# Patient Record
Sex: Female | Born: 1950 | Race: White | Hispanic: No | Marital: Single | Smoking: Current some day smoker
Health system: Southern US, Community
[De-identification: ages and names within clinical notes are randomized; demographics above are authoritative.]

## PROBLEM LIST (undated history)

## (undated) DIAGNOSIS — F32A Depression, unspecified: Secondary | ICD-10-CM

## (undated) DIAGNOSIS — M4800 Spinal stenosis, site unspecified: Secondary | ICD-10-CM

## (undated) DIAGNOSIS — K219 Gastro-esophageal reflux disease without esophagitis: Secondary | ICD-10-CM

## (undated) DIAGNOSIS — K429 Umbilical hernia without obstruction or gangrene: Secondary | ICD-10-CM

## (undated) DIAGNOSIS — M549 Dorsalgia, unspecified: Secondary | ICD-10-CM

## (undated) DIAGNOSIS — F419 Anxiety disorder, unspecified: Secondary | ICD-10-CM

## (undated) DIAGNOSIS — M1990 Unspecified osteoarthritis, unspecified site: Secondary | ICD-10-CM

## (undated) DIAGNOSIS — N950 Postmenopausal bleeding: Secondary | ICD-10-CM

## (undated) DIAGNOSIS — C55 Malignant neoplasm of uterus, part unspecified: Secondary | ICD-10-CM

## (undated) DIAGNOSIS — I10 Essential (primary) hypertension: Secondary | ICD-10-CM

## (undated) DIAGNOSIS — K439 Ventral hernia without obstruction or gangrene: Secondary | ICD-10-CM

## (undated) DIAGNOSIS — G8929 Other chronic pain: Secondary | ICD-10-CM

## (undated) HISTORY — DX: Postmenopausal bleeding: N95.0

## (undated) HISTORY — DX: Other chronic pain: G89.29

## (undated) HISTORY — DX: Ventral hernia without obstruction or gangrene: K43.9

## (undated) HISTORY — DX: Dorsalgia, unspecified: M54.9

## (undated) HISTORY — DX: Essential (primary) hypertension: I10

## (undated) HISTORY — PX: BREAST BIOPSY: SHX20

## (undated) HISTORY — PX: FOOT SURGERY: SHX648

## (undated) HISTORY — PX: CARPAL TUNNEL RELEASE: SHX101

## (undated) HISTORY — DX: Unspecified osteoarthritis, unspecified site: M19.90

## (undated) HISTORY — DX: Spinal stenosis, site unspecified: M48.00

## (undated) HISTORY — PX: CHOLECYSTECTOMY: SHX55

## (undated) HISTORY — DX: Umbilical hernia without obstruction or gangrene: K42.9

## (undated) HISTORY — PX: UMBILICAL HERNIA REPAIR: SHX196

## (undated) HISTORY — DX: Malignant neoplasm of uterus, part unspecified: C55

---

## 2004-07-07 ENCOUNTER — Ambulatory Visit: Payer: Self-pay

## 2004-07-18 ENCOUNTER — Ambulatory Visit: Payer: Self-pay

## 2005-01-18 ENCOUNTER — Ambulatory Visit: Payer: Self-pay

## 2005-07-19 ENCOUNTER — Ambulatory Visit: Payer: Self-pay

## 2005-08-21 ENCOUNTER — Other Ambulatory Visit: Payer: Self-pay

## 2005-08-24 ENCOUNTER — Ambulatory Visit: Payer: Self-pay | Admitting: Surgery

## 2006-02-05 ENCOUNTER — Ambulatory Visit: Payer: Self-pay | Admitting: Unknown Physician Specialty

## 2006-02-20 ENCOUNTER — Ambulatory Visit: Payer: Self-pay | Admitting: Unknown Physician Specialty

## 2006-08-01 ENCOUNTER — Ambulatory Visit: Payer: Self-pay

## 2006-10-23 ENCOUNTER — Ambulatory Visit: Payer: Self-pay | Admitting: Physician Assistant

## 2006-10-25 ENCOUNTER — Ambulatory Visit: Payer: Self-pay | Admitting: Surgery

## 2006-10-25 ENCOUNTER — Other Ambulatory Visit: Payer: Self-pay

## 2007-08-06 ENCOUNTER — Ambulatory Visit: Payer: Self-pay

## 2008-09-01 ENCOUNTER — Ambulatory Visit: Payer: Self-pay

## 2013-05-23 DIAGNOSIS — E7800 Pure hypercholesterolemia, unspecified: Secondary | ICD-10-CM | POA: Insufficient documentation

## 2013-05-23 DIAGNOSIS — Q669 Congenital deformity of feet, unspecified, unspecified foot: Secondary | ICD-10-CM | POA: Insufficient documentation

## 2013-05-23 DIAGNOSIS — Q6689 Other  specified congenital deformities of feet: Secondary | ICD-10-CM | POA: Insufficient documentation

## 2013-12-25 ENCOUNTER — Inpatient Hospital Stay: Payer: Self-pay | Admitting: Surgery

## 2013-12-25 LAB — COMPREHENSIVE METABOLIC PANEL
Albumin: 3.4 g/dL (ref 3.4–5.0)
Alkaline Phosphatase: 73 U/L
Anion Gap: 12 (ref 7–16)
BUN: 31 mg/dL — ABNORMAL HIGH (ref 7–18)
Bilirubin,Total: 0.8 mg/dL (ref 0.2–1.0)
CALCIUM: 8.8 mg/dL (ref 8.5–10.1)
CREATININE: 0.98 mg/dL (ref 0.60–1.30)
Chloride: 86 mmol/L — ABNORMAL LOW (ref 98–107)
Co2: 25 mmol/L (ref 21–32)
EGFR (Non-African Amer.): >60
GLUCOSE: 142 mg/dL — AB (ref 65–99)
OSMOLALITY: 257 (ref 275–301)
Potassium: 3.3 mmol/L — ABNORMAL LOW (ref 3.5–5.1)
SGOT(AST): 27 U/L (ref 15–37)
SGPT (ALT): 22 U/L
Sodium: 123 mmol/L — ABNORMAL LOW (ref 136–145)
TOTAL PROTEIN: 7.4 g/dL (ref 6.4–8.2)

## 2013-12-25 LAB — CBC WITH DIFFERENTIAL/PLATELET
BASOS ABS: 0.1 10*3/uL (ref 0.0–0.1)
BASOS PCT: 0.4 %
Eosinophil #: 0.1 10*3/uL (ref 0.0–0.7)
Eosinophil %: 0.6 %
HCT: 41.1 % (ref 35.0–47.0)
HGB: 14.2 g/dL (ref 12.0–16.0)
Lymphocyte #: 1.7 10*3/uL (ref 1.0–3.6)
Lymphocyte %: 10.8 %
MCH: 30.2 pg (ref 26.0–34.0)
MCHC: 34.6 g/dL (ref 32.0–36.0)
MCV: 88 fL (ref 80–100)
Monocyte #: 1.5 x10 3/mm — ABNORMAL HIGH (ref 0.2–0.9)
Monocyte %: 9.3 %
NEUTROS ABS: 12.5 10*3/uL — AB (ref 1.4–6.5)
Neutrophil %: 78.9 %
Platelet: 309 10*3/uL (ref 150–440)
RBC: 4.69 10*6/uL (ref 3.80–5.20)
RDW: 13.4 % (ref 11.5–14.5)
WBC: 15.9 10*3/uL — AB (ref 3.6–11.0)

## 2013-12-25 LAB — INFLUENZA A,B,H1N1 - PCR (ARMC)
H1N1 flu by pcr: NOT DETECTED
INFLAPCR: NEGATIVE
Influenza B By PCR: NEGATIVE

## 2013-12-25 LAB — TROPONIN I: Troponin-I: <0.02 ng/mL

## 2013-12-25 LAB — LIPASE, BLOOD: Lipase: 137 U/L (ref 73–393)

## 2013-12-26 HISTORY — PX: VENTRAL HERNIA REPAIR: SHX424

## 2013-12-26 LAB — URINALYSIS, COMPLETE
Bilirubin,UR: NEGATIVE
Blood: NEGATIVE
GLUCOSE, UR: NEGATIVE mg/dL (ref 0–75)
LEUKOCYTE ESTERASE: NEGATIVE
NITRITE: NEGATIVE
PROTEIN: NEGATIVE
Ph: 5 (ref 4.5–8.0)
RBC,UR: 16 /HPF (ref 0–5)
SPECIFIC GRAVITY: 1.01 (ref 1.003–1.030)
Squamous Epithelial: 23
WBC UR: NONE SEEN /HPF (ref 0–5)

## 2013-12-26 LAB — CBC WITH DIFFERENTIAL/PLATELET
Basophil #: 0.1 10*3/uL (ref 0.0–0.1)
Basophil %: 0.6 %
EOS PCT: 0.6 %
Eosinophil #: 0.1 10*3/uL (ref 0.0–0.7)
HCT: 36.5 % (ref 35.0–47.0)
HGB: 12.5 g/dL (ref 12.0–16.0)
Lymphocyte #: 1.2 10*3/uL (ref 1.0–3.6)
Lymphocyte %: 8.8 %
MCH: 30.1 pg (ref 26.0–34.0)
MCHC: 34.2 g/dL (ref 32.0–36.0)
MCV: 88 fL (ref 80–100)
MONO ABS: 1.6 x10 3/mm — AB (ref 0.2–0.9)
MONOS PCT: 12.3 %
Neutrophil #: 10.1 10*3/uL — ABNORMAL HIGH (ref 1.4–6.5)
Neutrophil %: 77.7 %
PLATELETS: 277 10*3/uL (ref 150–440)
RBC: 4.14 10*6/uL (ref 3.80–5.20)
RDW: 13.6 % (ref 11.5–14.5)
WBC: 13.1 10*3/uL — AB (ref 3.6–11.0)

## 2013-12-27 LAB — CBC WITH DIFFERENTIAL/PLATELET
Basophil #: 0 10*3/uL (ref 0.0–0.1)
Basophil %: 0.1 %
Eosinophil #: 0 10*3/uL (ref 0.0–0.7)
Eosinophil %: 0.1 %
HCT: 36.8 % (ref 35.0–47.0)
HGB: 12.8 g/dL (ref 12.0–16.0)
LYMPHS ABS: 0.9 10*3/uL — AB (ref 1.0–3.6)
Lymphocyte %: 7.1 %
MCH: 30.7 pg (ref 26.0–34.0)
MCHC: 34.7 g/dL (ref 32.0–36.0)
MCV: 89 fL (ref 80–100)
MONOS PCT: 9.4 %
Monocyte #: 1.2 x10 3/mm — ABNORMAL HIGH (ref 0.2–0.9)
NEUTROS ABS: 10.6 10*3/uL — AB (ref 1.4–6.5)
NEUTROS PCT: 83.3 %
Platelet: 260 10*3/uL (ref 150–440)
RBC: 4.16 10*6/uL (ref 3.80–5.20)
RDW: 13.7 % (ref 11.5–14.5)
WBC: 12.7 10*3/uL — AB (ref 3.6–11.0)

## 2013-12-27 LAB — BASIC METABOLIC PANEL
Anion Gap: 8 (ref 7–16)
BUN: 19 mg/dL — ABNORMAL HIGH (ref 7–18)
CALCIUM: 7.9 mg/dL — AB (ref 8.5–10.1)
CHLORIDE: 89 mmol/L — AB (ref 98–107)
CREATININE: 0.83 mg/dL (ref 0.60–1.30)
Co2: 32 mmol/L (ref 21–32)
EGFR (Non-African Amer.): >60
Glucose: 159 mg/dL — ABNORMAL HIGH (ref 65–99)
Osmolality: 265 (ref 275–301)
Potassium: 3.5 mmol/L (ref 3.5–5.1)
Sodium: 129 mmol/L — ABNORMAL LOW (ref 136–145)

## 2013-12-27 LAB — URINALYSIS, COMPLETE
Bacteria: NONE SEEN
Bilirubin,UR: NEGATIVE
GLUCOSE, UR: NEGATIVE mg/dL (ref 0–75)
Ketone: NEGATIVE
Leukocyte Esterase: NEGATIVE
NITRITE: NEGATIVE
Ph: 5 (ref 4.5–8.0)
Specific Gravity: 1.026 (ref 1.003–1.030)
Squamous Epithelial: NONE SEEN
WBC UR: 1 /HPF (ref 0–5)

## 2013-12-28 LAB — COMPREHENSIVE METABOLIC PANEL
ALT: 16 U/L
Albumin: 2.4 g/dL — ABNORMAL LOW (ref 3.4–5.0)
Alkaline Phosphatase: 65 U/L
Anion Gap: 5 — ABNORMAL LOW (ref 7–16)
BUN: 12 mg/dL (ref 7–18)
Bilirubin,Total: 0.4 mg/dL (ref 0.2–1.0)
CO2: 33 mmol/L — AB (ref 21–32)
Calcium, Total: 7.7 mg/dL — ABNORMAL LOW (ref 8.5–10.1)
Chloride: 96 mmol/L — ABNORMAL LOW (ref 98–107)
Creatinine: 0.81 mg/dL (ref 0.60–1.30)
EGFR (African American): >60
Glucose: 135 mg/dL — ABNORMAL HIGH (ref 65–99)
OSMOLALITY: 270 (ref 275–301)
Potassium: 3.2 mmol/L — ABNORMAL LOW (ref 3.5–5.1)
SGOT(AST): 22 U/L (ref 15–37)
SODIUM: 134 mmol/L — AB (ref 136–145)
Total Protein: 5.8 g/dL — ABNORMAL LOW (ref 6.4–8.2)

## 2013-12-28 LAB — CBC WITH DIFFERENTIAL/PLATELET
Basophil #: 0.1 10*3/uL (ref 0.0–0.1)
Basophil %: 0.8 %
EOS PCT: 2.8 %
Eosinophil #: 0.3 10*3/uL (ref 0.0–0.7)
HCT: 35.3 % (ref 35.0–47.0)
HGB: 11.8 g/dL — ABNORMAL LOW (ref 12.0–16.0)
Lymphocyte #: 1.3 10*3/uL (ref 1.0–3.6)
Lymphocyte %: 13.8 %
MCH: 30.2 pg (ref 26.0–34.0)
MCHC: 33.3 g/dL (ref 32.0–36.0)
MCV: 91 fL (ref 80–100)
MONO ABS: 1 x10 3/mm — AB (ref 0.2–0.9)
Monocyte %: 10.7 %
Neutrophil #: 6.9 10*3/uL — ABNORMAL HIGH (ref 1.4–6.5)
Neutrophil %: 71.9 %
PLATELETS: 244 10*3/uL (ref 150–440)
RBC: 3.89 10*6/uL (ref 3.80–5.20)
RDW: 13.3 % (ref 11.5–14.5)
WBC: 9.5 10*3/uL (ref 3.6–11.0)

## 2014-01-10 ENCOUNTER — Inpatient Hospital Stay: Payer: Self-pay | Admitting: Surgery

## 2014-01-10 HISTORY — PX: IRRIGATION AND DEBRIDEMENT ABDOMEN: SHX6600

## 2014-01-10 LAB — CBC WITH DIFFERENTIAL/PLATELET
BASOS ABS: 0.1 10*3/uL (ref 0.0–0.1)
Basophil %: 0.8 %
Eosinophil #: 0.4 10*3/uL (ref 0.0–0.7)
Eosinophil %: 3.1 %
HCT: 30.6 % — ABNORMAL LOW (ref 35.0–47.0)
HGB: 10.2 g/dL — ABNORMAL LOW (ref 12.0–16.0)
LYMPHS ABS: 1.2 10*3/uL (ref 1.0–3.6)
Lymphocyte %: 10.1 %
MCH: 29.7 pg (ref 26.0–34.0)
MCHC: 33.2 g/dL (ref 32.0–36.0)
MCV: 89 fL (ref 80–100)
MONOS PCT: 7.9 %
Monocyte #: 0.9 x10 3/mm (ref 0.2–0.9)
NEUTROS ABS: 9.2 10*3/uL — AB (ref 1.4–6.5)
Neutrophil %: 78.1 %
Platelet: 240 10*3/uL (ref 150–440)
RBC: 3.43 10*6/uL — ABNORMAL LOW (ref 3.80–5.20)
RDW: 13.6 % (ref 11.5–14.5)
WBC: 11.9 10*3/uL — ABNORMAL HIGH (ref 3.6–11.0)

## 2014-01-10 LAB — BASIC METABOLIC PANEL
Anion Gap: 5 — ABNORMAL LOW (ref 7–16)
BUN: 6 mg/dL — ABNORMAL LOW (ref 7–18)
CALCIUM: 8.3 mg/dL — AB (ref 8.5–10.1)
CHLORIDE: 106 mmol/L (ref 98–107)
CREATININE: 0.8 mg/dL (ref 0.60–1.30)
Co2: 26 mmol/L (ref 21–32)
EGFR (African American): >60
EGFR (Non-African Amer.): >60
Glucose: 131 mg/dL — ABNORMAL HIGH (ref 65–99)
Osmolality: 273 (ref 275–301)
Potassium: 3.5 mmol/L (ref 3.5–5.1)
Sodium: 137 mmol/L (ref 136–145)

## 2014-01-14 LAB — WOUND CULTURE

## 2014-04-29 NOTE — Op Note (Signed)
PATIENT NAME:  Selena Rodgers, Selena Rodgers MR#:  [id number] DATE OF BIRTH:  Jul 06, 1950  DATE OF PROCEDURE:  12/26/2013  PREOPERATIVE DIAGNOSIS: Incarcerated ventral hernia with obstruction.   POSTOPERATIVE DIAGNOSIS: Incarcerated ventral hernia with obstruction.   OPERATION: Ventral hernia repair.  SURGEON: Rodena Goldmann III, MD  ANESTHESIA: General.  OPERATIVE PROCEDURE: With the patient in supine position, after the induction of appropriate general anesthesia, the patient's abdomen was prepped with chloraprep and draped with sterile towels. The hernia was easily palpable below and to the right of the umbilicus. The previous infraumbilical incision was opened and extended to the right side. The hernia sac was encountered easily. It was quite edematous and difficult to dissect free from surrounding tissue. It eventually was opened and resected back to the fascia. The bowel itself appeared moderately dusky. In trying to release the fascia for better exposure, the bowel fell back into the abdomen. I was unable to retrieve that particular piece of bowel again, as she had multiple abdominal adhesions. In clearing the fascia, it was noted that there were several more abdominal defects under the umbilicus and slightly superior with almost a Swiss cheese look in the anterior abdominal wall. There was evidence from the previous repair with Prolene suture, but the AlloDerm mesh was no longer visible. The fascia was cleaned in a circumferential fashion. The superior-most defect was closed internally with figure-of-eight sutures of 0-Prolene. Because of the bowel incarceration and strangulation, I was concerned about the possibility of contamination. So we elected to perform a primary repair with a running suture of #1 Prolene interlocking sutures tying under the umbilicus and then putting an overlay patch of Vicryl mesh to help support the closure. The Vicryl mesh was sutured in with 0 Vicryl. A drain was placed through a  separate stab wound using a 7 mm flat Jackson-Pratt drain secured with 3-0 nylon. The subcutaneous space was obliterated with 3-0 Vicryl and the skin was clipped. A compressive dressing was applied as was an abdominal binder. I am concerned about the repair in the situation, but in view of the potential for contamination, I was uncomfortable putting a permanent mesh in place. She may need formal repair in the future when she is not obstructed. The patient awakened and returned to the recovery room in satisfactory condition.    ____________________________ Micheline Maze, MD rle:sw D: 12/26/2013 02:47:57 ET T: 12/26/2013 07:36:16 ET JOB#: [id number]  cc: Micheline Maze, MD, <Dictator> John B. Sarina Ser, MD Rodena Goldmann MD ELECTRONICALLY SIGNED 01/02/2014 23:06

## 2014-05-03 NOTE — Discharge Summary (Signed)
PATIENT NAME:  [name], [name] MR#:  [id number] DATE OF BIRTH:  10/15/1950  DATE OF ADMISSION:  12/25/2013 DATE OF DISCHARGE:  12/30/2013  BRIEF HISTORY: Selena Rodgers is a 64 year old woman admitted on 12/25/2013 with incarcerated ventral hernia. She had a long-standing ventral hernia but suffered an episode of incarceration on the day prior to admission. She presented to the Emergency Room with obvious tenderness. CT scan revealed edematous, compromised bowel in the hernia sac. She was taken urgently to surgery early that morning where she underwent hernia repair. In manipulating the hernia defect the bowel, which appeared quite dusky, fell back into the abdomen and could not be retrieved. I then elected to perform a primary repair and reinforced it with Vicryl mesh because of concern about the possibility of infection. She did well with slow return of bowel function. She was discharged home on the 29th and will be followed in the office in 7 to 10 days' time. Bathing, activity, and driving instructions were given to the patient.   DISCHARGE MEDICATIONS: She is to resume her home medications including citalopram 20 mg p.o. once a day, hydrochlorothiazide 25 mg p.o. once a day, benazepril 20 mg once a day, Naproxen 500 mg twice a day and hydrocodone/acetaminophen 5/325 every 6 hours p.r.n.   FINAL DISCHARGE DIAGNOSIS: Incarcerated ventral hernia.   SURGERY: Ventral hernia repair.   ____________________________ Micheline Maze, MD rle:AT D: 01/13/2014 18:40:00 ET T: 01/14/2014 03:27:49 ET JOB#: [id number]  cc: Hewitt Blade. Sarina Ser, MD Rodena Goldmann III, MD, <Dictator>

## 2014-05-03 NOTE — H&P (Signed)
   Subjective/Chief Complaint Postop abdominal wall cellulitis, postop abscess on CT   History of Present Illness 64 yo F with history of recent repair of incarcerated VH, primary with vicryl mesh overlay, who was discharged in satisfactory condition and later returned to office visit with erythema around incision and cloudy fluid from drain.  Was began on augmentin without improvement.  CT shows abdominal wall abscess superficial to repair.  WBC 13.   Otherwise doing well.  No fevers/chills, tolerating diet.  Min pain.   Past History hernia repair, primary with vicryl overlay 12/25 H/o hernia repair with alloderm mesh H/o cholecystectomy H/o spinal stenosis obesity chronic back pain H/o carpel tunnel release H/o foot surgery   Past Medical Health Hypertension   Code Status Full Code   Past Med/Surgical Hx:  Spinal Stenosis:   Arthritis:   Hypertension:   Hernia:   Chronic Back Pain:   Breast Biopsy:   Hernia Repair:   Foot Surgery - Right:   Carpal Tunnel Release:   ALLERGIES:  Chlor-Trimeton: Anaphylaxis  Tylenol: Rash  ASA: Unknown  Latex: Unknown  Tape: Unknown  Other -Explain in Comment Field: Unknown  Flax Seed Oil: Swelling   Medications Augmentin   Family and Social History:  Family History Non-Contributory   Social History negative tobacco, negative ETOH   Place of Living Home   Review of Systems:  Subjective/Chief Complaint abdominal wall cellulitis, erythema   Fever/Chills No   Cough No   Sputum No   Abdominal Pain No   Diarrhea No   Constipation No   Nausea/Vomiting No   SOB/DOE No   Chest Pain No   Dysuria No   Physical Exam:  GEN well developed, well nourished, no acute distress, obese   HEENT pale conjunctivae, PERRL, hearing intact to voice, good dentition   NECK No masses   RESP normal resp effort  clear BS   CARD regular rate  no murmur  no thrills   ABD denies tenderness  soft  normal BS  + cellulitis right incision    EXTR negative cyanosis/clubbing, negative edema   SKIN normal to palpation, No rashes, No ulcers   NEURO cranial nerves intact, negative rigidity, negative tremor, follows commands   PSYCH A+O to time, place, person, good insight    Assessment/Admission Diagnosis 64 yo obese female with postop abscess s/p repair of incarcerated recurrent VH.   Plan Admit, IV abx, IVF.  Plan for I and D with Dr. Marina Gravel, have discussed with him and he agrees with this plan.   Electronic Signatures: Floyde Parkins (MD)  (Signed 08-Jan-16 14:41)  Authored: CHIEF COMPLAINT and HISTORY, PAST MEDICAL/SURGIAL HISTORY, ALLERGIES, OTHER MEDICATIONS, FAMILY AND SOCIAL HISTORY, REVIEW OF SYSTEMS, PHYSICAL EXAM, ASSESSMENT AND PLAN   Last Updated: 08-Jan-16 14:41 by Floyde Parkins (MD)

## 2014-05-03 NOTE — Discharge Summary (Signed)
PATIENT NAME:  Selena Rodgers, Selena Rodgers MR#:  [id number] DATE OF BIRTH:  1950-08-09  DATE OF ADMISSION:  12/25/2013 DATE OF DISCHARGE:  12/30/2013  BRIEF HISTORY: Selena Rodgers is a 64 year old woman admitted on 12/25/2013 with incarcerated ventral hernia. She had a long-standing ventral hernia but suffered an episode of incarceration on the day prior to admission. She presented to the Emergency Room with obvious tenderness. CT scan revealed edematous, compromised bowel in the hernia sac. She was taken urgently to surgery early that morning where she underwent hernia repair. In manipulating the hernia defect the bowel, which appeared quite dusky, fell back into the abdomen and could not be retrieved. I then elected to perform a primary repair and reinforced it with Vicryl mesh because of concern about the possibility of infection. She did well with slow return of bowel function. She was discharged home on the 29th and will be followed in the office in 7 to 10 days' time. Bathing, activity, and driving instructions were given to the patient.   DISCHARGE MEDICATIONS: She is to resume her home medications including citalopram 20 mg p.o. once a day, hydrochlorothiazide 25 mg p.o. once a day, benazepril 20 mg once a day, Naproxen 500 mg twice a day and hydrocodone/acetaminophen 5/325 every 6 hours p.r.n.   FINAL DISCHARGE DIAGNOSIS: Incarcerated ventral hernia.   SURGERY: Ventral hernia repair.   ____________________________ Micheline Maze, MD rle:AT D: 01/13/2014 18:40:00 ET T: 01/14/2014 03:27:49 ET JOB#: [id number]  cc: Hewitt Blade. Sarina Ser, MD Rodena Goldmann III, MD, <Dictator>    Rodena Goldmann MD ELECTRONICALLY SIGNED 01/17/2014 18:39

## 2014-05-03 NOTE — Op Note (Signed)
PATIENT NAME:  Selena Rodgers, Selena Rodgers MR#:  [id number] DATE OF BIRTH:  [birth year]  DATE OF PROCEDURE:  01/10/2014  PREOPERATIVE DIAGNOSIS: Abdominal wall infection, status post herniorrhaphy with mesh.   POSTOPERATIVE DIAGNOSIS: Abdominal wall infection, status post herniorrhaphy with mesh.   PROCEDURE PERFORMED: Incision and drainage of abdominal wall and placement of Penrose drain.   SURGEON: Eureka Valdes A. Marina Gravel, MD.   ASSISTANT: Scrub tech.  TYPE OF ANESTHESIA: General with LMA.  FINDINGS: Pus.   SPECIMENS: Pus.   DRAINS: A 1 inch Penrose drain.   DESCRIPTION OF PROCEDURE: With informed consent, supine position, general anesthesia with LMA, the patient's abdominal wall dressing was removed. The abdominal wall was sterilely prepped and draped with Betadine solution. Timeout was observed. The incision had been closed with staples and on the right lateral aspect 3 to 4 staples were removed. Initially the subcutaneous tissues appeared to be intact. However, deep to this I was able to probe into a cavity which was above the fascia and above the mesh, demonstrating a large amount of thick, white, creamy pus. A sample was sent for micro bacteriological analysis.   The wound was then irrigated with saline, aspirated dry. All loculations appeared to be disrupted with finger fracture technique. A 1 inch Penrose drain was directed into the cavity exiting the wound. The skin edges were thus reapproximated over the drain utilizing interrupted simple and vertical mattress 3-0 nylon sutures. A bulky occlusive dressing was placed and the patient was subsequently extubated and taken to the recovery room in stable and satisfactory condition by anesthesia services.    ____________________________ Jeannette How Marina Gravel, MD mab:at D: 01/10/2014 08:49:00 ET T: 01/10/2014 09:17:23 ET JOB#: [id number]  cc: Elta Guadeloupe A. Marina Gravel, MD, <Dictator> Hortencia Conradi MD ELECTRONICALLY SIGNED 01/14/2014 10:25

## 2014-06-17 ENCOUNTER — Ambulatory Visit: Payer: Self-pay | Admitting: Surgery

## 2014-06-23 ENCOUNTER — Ambulatory Visit (INDEPENDENT_AMBULATORY_CARE_PROVIDER_SITE_OTHER): Payer: 59 | Admitting: Surgery

## 2014-06-23 ENCOUNTER — Encounter: Payer: Self-pay | Admitting: Surgery

## 2014-06-23 VITALS — BP 119/71 | HR 67 | Temp 98.7°F | Ht 67.0 in | Wt 273.0 lb

## 2014-06-23 DIAGNOSIS — K432 Incisional hernia without obstruction or gangrene: Secondary | ICD-10-CM

## 2014-06-23 DIAGNOSIS — I10 Essential (primary) hypertension: Secondary | ICD-10-CM | POA: Diagnosis not present

## 2014-06-23 DIAGNOSIS — R1900 Intra-abdominal and pelvic swelling, mass and lump, unspecified site: Secondary | ICD-10-CM | POA: Diagnosis not present

## 2014-06-23 NOTE — Patient Instructions (Signed)
You will need a CT of Abdomen and Pelvis. Central Scheduling should call within 3 days. If you do not hear from them, please call our office.  CT results will depend on when Follow-up appt is set-up.

## 2014-06-26 ENCOUNTER — Ambulatory Visit
Admission: RE | Admit: 2014-06-26 | Discharge: 2014-06-26 | Disposition: A | Discharge status: Home / Self Care | Payer: 59 | Source: Ambulatory Visit | Attending: Surgery | Admitting: Surgery

## 2014-06-26 DIAGNOSIS — M47896 Other spondylosis, lumbar region: Secondary | ICD-10-CM | POA: Insufficient documentation

## 2014-06-26 DIAGNOSIS — R1900 Intra-abdominal and pelvic swelling, mass and lump, unspecified site: Secondary | ICD-10-CM

## 2014-06-26 DIAGNOSIS — K439 Ventral hernia without obstruction or gangrene: Secondary | ICD-10-CM | POA: Diagnosis not present

## 2014-06-26 MED ORDER — IOHEXOL 300 MG/ML  SOLN
100.0000 mL | Freq: Once | INTRAMUSCULAR | Status: AC | PRN
  Administered 2014-06-26: 100 mL via INTRAVENOUS

## 2014-06-29 ENCOUNTER — Telehealth: Payer: Self-pay | Admitting: Surgery

## 2014-06-29 NOTE — Telephone Encounter (Signed)
Patient, Selena Rodgers called and would like the nurse to call her sister, Selena Rodgers with the results her CT from last week. Thanks

## 2014-06-29 NOTE — Telephone Encounter (Signed)
Called patient's sister to review CT scan. Explained results and that patient would need to f/u in office with Dr. Marina Gravel to discuss next step. She only wants to see Dr. Pat Patrick and was placed on the schedule on 07/28/14 in [place].  Verbalized understanding of everything explained during phone call and read back appointment information.

## 2014-06-30 DIAGNOSIS — R1900 Intra-abdominal and pelvic swelling, mass and lump, unspecified site: Secondary | ICD-10-CM | POA: Insufficient documentation

## 2014-06-30 DIAGNOSIS — K432 Incisional hernia without obstruction or gangrene: Secondary | ICD-10-CM | POA: Insufficient documentation

## 2014-06-30 NOTE — Progress Notes (Signed)
Patient ID: Selena Rodgers, female   DOB: [birth year], 64 y.o.   MRN: [id number]  Chief Complaint  Patient presents with  . Follow-up    Intra-abdominal Abscess  . Post-op Problem    Ventral Hernia Repair    HPI  Selena Rodgers is a 64 y.o. Female with a history of hypertension and morbid obesity as well as prior ventral hernia repair with Vicryl mesh due to incarcerated bowel several months ago which was complicated by a postoperative wound infection which was drained. Since that time the patient is had persistent swelling around her umbilicus. She has had intermittent drainage from a punctum of the skin from the prior incision and drainage site. There has been no fevers. No jaundice no nausea no vomiting no diarrhea. Further imaging has been performed. The patient refers herself back to the office.  Past Medical History  Diagnosis Date  . Spinal stenosis   . Chronic back pain   . Arthritis   . Hypertension     Past Surgical History  Procedure Laterality Date  . Cholecystectomy    . Breast biopsy    . Foot surgery Right   . Carpal tunnel release    . Umbilical hernia repair    . Ventral hernia repair  12/26/2013    Dr. Pat Patrick  . Irrigation and debridement abdomen  01/10/2014    Dr. Marina Gravel    Family History  Problem Relation Age of Onset  . Diabetes Maternal Grandmother   . Diabetes Maternal Grandfather   . Diabetes Paternal Grandmother   . Diabetes Paternal Grandfather   . Hypertension Mother     Social History History  Substance Use Topics  . Smoking status: Former Smoker    Quit date: 01/02/2014  . Smokeless tobacco: Never Used  . Alcohol Use: No    Allergies  Allergen Reactions  . Other Anaphylaxis    Chlor-Trimeton  . Flax Seed Oil [Bio-Flax] Swelling  . Latex Hives    Blisters  . Tape     Blisters  . Acetaminophen Rash  . Aspirin Rash    Current Outpatient Prescriptions  Medication Sig Dispense Refill  . benazepril (LOTENSIN) 20 MG tablet Take 1  tablet by mouth daily.    . citalopram (CELEXA) 20 MG tablet Take 1 tablet by mouth daily.    Marland Kitchen esomeprazole (NEXIUM) 20 MG capsule Take 1 capsule by mouth daily.    . hydrochlorothiazide (HYDRODIURIL) 25 MG tablet Take 1 tablet by mouth daily as needed.    . naproxen (EC NAPROSYN) 500 MG EC tablet Take 1 tablet by mouth 2 (two) times daily.     No current facility-administered medications for this visit.      Review of Systems A 10 point review of systems was asked and was negative except for the following positive findings described in history of present illness above.  Blood pressure 119/71, pulse 67, temperature 98.7 F (37.1 C), temperature source Oral, height 5\' 7"  (1.702 m), weight 123.832 kg (273 lb).  Physical Exam CONSTITUTIONAL:  Pleasant, well-developed, well-nourished, and in no acute distress. Morbidly obese white female in no distress.  EYES: Pupils equal and reactive to light, Sclera non-icteric EARS, NOSE, MOUTH AND THROAT:  The oropharynx was clear.  Dentition is good repair.  Oral mucosa pink and moist. LYMPH NODES:  Lymph nodes in the neck and axillae were normal RESPIRATORY:  Lungs were clear.  Normal respiratory effort without pathologic use of accessory muscles of respiration CARDIOVASCULAR: Heart was regular  without murmurs.  There were no carotid bruits. GI: The abdomen was soft, nontender, and nondistended. There is a large reducible ventral midline periumbilical hernia. There is a 3 mm area of granuloma on the scan without punctate drainage. It is at the inferior aspect of the prior incisional ventral hernia repair scar. There is no cellulitis. Skin appears normal in this area except for the granuloma. GU:  Rectal deferred.   MUSCULOSKELETAL:  Normal muscle strength and tone.  No clubbing or cyanosis.   SKIN:  There were no pathologic skin lesions.  There were no nodules on palpation. NEUROLOGIC:  Sensation is normal.  Cranial nerves are grossly intact. PSYCH:   Oriented to person, place and time.  Mood and affect are normal.  Data Reviewed Prior hospitalization records and operative notes were reviewed personally in the office today.  I have personally reviewed the patient's imaging, laboratory findings and medical records.    Assessment    This is a 64 year old morbidly obese white female with a recurrent incisional ventral hernia likely secondary to the fact that she had a wound infection and Vicodin mesh was placed. At this point she is a very high risk patient for recurrent herniation despite any type of repair.    Plan    I will obtain a CT scan of the patient be seen back in the office following this for consideration of further operative versus nonoperative interventions. She may benefit from referral to a tertiary care hernia center.         Sherri Rad 06/30/2014, 9:05 AM

## 2014-07-28 ENCOUNTER — Encounter: Payer: Self-pay | Admitting: Surgery

## 2014-07-28 ENCOUNTER — Ambulatory Visit (INDEPENDENT_AMBULATORY_CARE_PROVIDER_SITE_OTHER): Payer: 59 | Admitting: Surgery

## 2014-07-28 VITALS — BP 138/65 | HR 79 | Temp 97.8°F | Ht 67.0 in | Wt 273.0 lb

## 2014-07-28 DIAGNOSIS — K432 Incisional hernia without obstruction or gangrene: Secondary | ICD-10-CM

## 2014-07-28 NOTE — Patient Instructions (Signed)
Follow-up in 2-3 weeks with Dr. Pat Patrick.

## 2014-07-28 NOTE — Progress Notes (Signed)
Outpatient Surgical Follow Up  07/28/2014  Selena Rodgers is an 64 y.o. female. With a recurrent umbilical/ventral hernia.  Chief Complaint  Patient presents with  . Follow-up    Abdominal Wound  . Umbilical Hernia    HPI: She underwent repair of incarcerated umbilical/ventral hernia at the end of last year. There was a large amount of free fluid and clear-cut bowel ischemia at the time of repair. I placed a piece of Vicryl mesh to bridge the defect because of the risk of infection. Subsequently several weeks later she did develop a big abdominal wall infection which required subsequent drainage. She's done well in the interim but her hernia has recurred as might be expected. She denies any GI or GU symptoms at the present time. She has been gaining some weight.  Past Medical History  Diagnosis Date  . Spinal stenosis   . Chronic back pain   . Arthritis   . Hypertension     Past Surgical History  Procedure Laterality Date  . Cholecystectomy    . Breast biopsy    . Foot surgery Right   . Carpal tunnel release    . Umbilical hernia repair    . Ventral hernia repair  12/26/2013    Dr. Pat Patrick  . Irrigation and debridement abdomen  01/10/2014    Dr. Marina Gravel    Family History  Problem Relation Age of Onset  . Diabetes Maternal Grandmother   . Diabetes Maternal Grandfather   . Diabetes Paternal Grandmother   . Diabetes Paternal Grandfather   . Hypertension Mother     Social History:  reports that she quit smoking about 6 months ago. She has never used smokeless tobacco. She reports that she does not drink alcohol or use illicit drugs.  Allergies:  Allergies  Allergen Reactions  . Other Anaphylaxis    Chlor-Trimeton  . Flax Seed Oil [Bio-Flax] Swelling  . Latex Hives    Blisters  . Tape     Blisters  . 2,4-D Dimethylamine (Amisol) Rash    Tdap  . Acetaminophen Rash  . Aspirin Rash    Medications reviewed.    ROS there are no change in her review of systems. She  has no new pulmonary cardiac or GI symptoms.    BP 138/65 mmHg  Pulse 79  Temp(Src) 97.8 F (36.6 C) (Oral)  Ht 5\' 7"  (1.702 m)  Wt 273 lb (123.832 kg)  BMI 42.75 kg/m2  Physical Exam  Constitutional: She is oriented to person, place, and time.  Markedly overweight  HENT:  Head: Normocephalic and atraumatic.  Eyes: Conjunctivae are normal. Pupils are equal, round, and reactive to light.  Neck: Normal range of motion. Neck supple.  Cardiovascular: Regular rhythm and normal heart sounds.   Pulmonary/Chest: She has no wheezes. She has no rales.  Abdominal: She exhibits distension and mass. There is tenderness.  She has a large recurrent hernia with an open draining sinus tract from just around her umbilicus. The fluid appears to be serosanguineous.  Musculoskeletal: Normal range of motion. She exhibits edema.  Neurological: She is alert and oriented to person, place, and time.  Skin: Skin is warm and dry.  Psychiatric: Mood and judgment normal.       No results found for this or any previous visit (from the past 48 hour(s)). No results found.  Assessment/Plan:  1. Incisional hernia, without obstruction or gangrene She continues to have a draining sinus tract from the site of her previous repair. This does  not appear to be infected the present time but has not completely cleared. I suspect that the opening is the site of her previous drain placement. I would not recommend any attempted surgical repair until the drainage has stopped completely. She's also very poor surgical candidate with her morbid obesity as long as she does not develop another obstruction I would like to avoid surgery if at all possible. She is in agreement. We'll see her back in 1 month's time.     [name]  07/28/2014,negative

## 2014-08-21 ENCOUNTER — Ambulatory Visit (INDEPENDENT_AMBULATORY_CARE_PROVIDER_SITE_OTHER): Payer: 59 | Admitting: Surgery

## 2014-08-21 ENCOUNTER — Encounter: Payer: Self-pay | Admitting: Surgery

## 2014-08-21 VITALS — BP 130/77 | HR 70 | Temp 98.2°F | Ht 67.0 in | Wt 274.6 lb

## 2014-08-21 DIAGNOSIS — K436 Other and unspecified ventral hernia with obstruction, without gangrene: Secondary | ICD-10-CM

## 2014-08-21 NOTE — Patient Instructions (Addendum)
Follow-up in 1 month with Dr. Pat Patrick. Call with any questions or concerns prior to your appointment.

## 2014-08-21 NOTE — Progress Notes (Signed)
Outpatient Surgical Follow Up  08/21/2014  Selena Rodgers is an 64 y.o. female.   Chief Complaint  Patient presents with  . Follow-up    Drainage from Incisional Hernia Site    HPI: She continues to improve. She does not have any particular complaints currently other than the enlarging recurrent hernia. She had an incarcerated segment of bowel repaired with Vicryl mesh because of the contamination. She did develop a postoperative infection and now with the Vicryl resort being she is developed a recurrent hernia. She had a persistent drainage site and we were concerned about possible fistula but that is improving.  Past Medical History  Diagnosis Date  . Spinal stenosis   . Chronic back pain   . Arthritis   . Hypertension     Past Surgical History  Procedure Laterality Date  . Cholecystectomy    . Breast biopsy    . Foot surgery Right   . Carpal tunnel release    . Umbilical hernia repair    . Ventral hernia repair  12/26/2013    Dr. Pat Patrick  . Irrigation and debridement abdomen  01/10/2014    Dr. Marina Gravel    Family History  Problem Relation Age of Onset  . Diabetes Maternal Grandmother   . Diabetes Maternal Grandfather   . Diabetes Paternal Grandmother   . Diabetes Paternal Grandfather   . Hypertension Mother     Social History:  reports that she quit smoking about 7 months ago. She has never used smokeless tobacco. She reports that she does not drink alcohol or use illicit drugs.  Allergies:  Allergies  Allergen Reactions  . Other Anaphylaxis    Chlor-Trimeton  . Flax Seed Oil [Bio-Flax] Swelling  . Latex Hives    Blisters  . Tape     Blisters  . 2,4-D Dimethylamine (Amisol) Rash    Tdap  . Acetaminophen Rash  . Aspirin Rash    Medications reviewed.    ROS    BP 130/77 mmHg  Pulse 70  Temp(Src) 98.2 F (36.8 C) (Oral)  Ht 5\' 7"  (1.702 m)  Wt 274 lb 9.6 oz (124.558 kg)  BMI 43.00 kg/m2  Physical Exam her abdominal wall looks good. The hernia is  slowly increasing in size. It looks as though the draining sinus finally scabbed over. Her abdomen is otherwise benign with minimal tenderness and good bowel sounds and no rebound or guarding.     No results found for this or any previous visit (from the past 48 hour(s)). No results found.  Assessment/Plan:  1. Ventral hernia with obstruction and without gangrene She continues to improve. She would be a very difficult repair. I would not want to put mesh back in this abdominal wall for several months following this most recent infection. We'll see her back again in a month's time repeat her CT scan atretic an idea of the size of the defect. She is pushing for repair but I would be very reluctant to attempt another repair with her medical problems her size and her recent infection.     Selena Rodgers  08/21/2014,negative

## 2014-09-17 DIAGNOSIS — M1990 Unspecified osteoarthritis, unspecified site: Secondary | ICD-10-CM | POA: Insufficient documentation

## 2014-09-17 DIAGNOSIS — F32A Depression, unspecified: Secondary | ICD-10-CM | POA: Insufficient documentation

## 2014-09-17 DIAGNOSIS — G8929 Other chronic pain: Secondary | ICD-10-CM | POA: Insufficient documentation

## 2014-09-17 DIAGNOSIS — F419 Anxiety disorder, unspecified: Secondary | ICD-10-CM | POA: Insufficient documentation

## 2014-09-17 DIAGNOSIS — M4800 Spinal stenosis, site unspecified: Secondary | ICD-10-CM | POA: Insufficient documentation

## 2014-09-17 DIAGNOSIS — R7303 Prediabetes: Secondary | ICD-10-CM | POA: Insufficient documentation

## 2014-09-17 DIAGNOSIS — I10 Essential (primary) hypertension: Secondary | ICD-10-CM | POA: Insufficient documentation

## 2014-09-17 DIAGNOSIS — F329 Major depressive disorder, single episode, unspecified: Secondary | ICD-10-CM | POA: Insufficient documentation

## 2014-09-17 DIAGNOSIS — M549 Dorsalgia, unspecified: Secondary | ICD-10-CM | POA: Insufficient documentation

## 2014-09-17 DIAGNOSIS — F17200 Nicotine dependence, unspecified, uncomplicated: Secondary | ICD-10-CM | POA: Insufficient documentation

## 2014-09-23 ENCOUNTER — Encounter: Payer: Self-pay | Admitting: Surgery

## 2014-09-23 ENCOUNTER — Ambulatory Visit (INDEPENDENT_AMBULATORY_CARE_PROVIDER_SITE_OTHER): Payer: 59 | Admitting: Surgery

## 2014-09-23 VITALS — BP 131/71 | HR 69 | Temp 98.4°F | Ht 67.0 in | Wt 280.0 lb

## 2014-09-23 DIAGNOSIS — K420 Umbilical hernia with obstruction, without gangrene: Secondary | ICD-10-CM

## 2014-09-23 NOTE — Patient Instructions (Signed)
Please call us if you have ant questions and concerns. Central Scheduling will be contacting you with an appointment.

## 2014-09-23 NOTE — Addendum Note (Signed)
Addended by: Wayna Chalet on: 09/23/2014 03:53 PM   Modules accepted: Orders

## 2014-09-23 NOTE — Progress Notes (Signed)
Outpatient Surgical Follow Up  09/23/2014  Selena Rodgers is an 64 y.o. female.   Chief Complaint  Patient presents with  . Follow-up    ventral Hernia    HPI: She returns for follow-up of her incarcerated umbilical hernia complicated by a mesh infection. She had Vicryl mesh placed at that time her original incarceration. She continues to have a small amount of drainage from the abdominal wall. It does look to be purulent although there is no surrounding erythema and no evidence of any ongoing infection. She does not have any current symptoms.  Past Medical History  Diagnosis Date  . Spinal stenosis   . Chronic back pain   . Arthritis   . Hypertension     Past Surgical History  Procedure Laterality Date  . Cholecystectomy    . Breast biopsy    . Foot surgery Right   . Carpal tunnel release    . Umbilical hernia repair    . Ventral hernia repair  12/26/2013    Dr. Pat Patrick  . Irrigation and debridement abdomen  01/10/2014    Dr. Marina Gravel    Family History  Problem Relation Age of Onset  . Diabetes Maternal Grandmother   . Diabetes Maternal Grandfather   . Diabetes Paternal Grandmother   . Diabetes Paternal Grandfather   . Hypertension Mother     Social History:  reports that she quit smoking about 8 months ago. She has never used smokeless tobacco. She reports that she does not drink alcohol or use illicit drugs.  Allergies:  Allergies  Allergen Reactions  . Other Anaphylaxis    Chlor-Trimeton  . Flax Seed Oil [Bio-Flax] Swelling  . Latex Hives    Blisters  . Tape     Blisters  . 2,4-D Dimethylamine (Amisol) Rash    Tdap  . Acetaminophen Rash  . Aspirin Rash    Medications reviewed.    ROS no change    BP 131/71 mmHg  Pulse 69  Temp(Src) 98.4 F (36.9 C) (Oral)  Ht 5\' 7"  (1.702 m)  Wt 127.007 kg (280 lb)  BMI 43.84 kg/m2  Physical Exam her wound looks good and is completely healed with exception of one small spot in the center of the previous  incision. There is a small amount of expressible pus. She does not have any other abdominal findings. I do believe hernias larger than it was in our previous evaluation.     No results found for this or any previous visit (from the past 48 hour(s)). No results found.  Assessment/Plan:  1. Umbilical hernia, incarcerated This woman demonstrates a very complicated problem. With her previous infection we are reluctant to place permanent mesh. She's also significantly overweight which would compromise any recovery. We will repeat her CT scan to be sure there is no evidence of any infection or reason for her to have a persistent draining sinus tract. Most of the mesh should be resolved or removed at this point. We will arrange for her to see one of my partners following her CT scan. She is in agreement.     [name]  09/23/2014,negative

## 2014-09-30 ENCOUNTER — Ambulatory Visit
Admission: RE | Admit: 2014-09-30 | Discharge: 2014-09-30 | Disposition: A | Discharge status: Home / Self Care | Payer: 59 | Source: Ambulatory Visit | Attending: Surgery | Admitting: Surgery

## 2014-09-30 DIAGNOSIS — N200 Calculus of kidney: Secondary | ICD-10-CM | POA: Diagnosis not present

## 2014-09-30 DIAGNOSIS — K420 Umbilical hernia with obstruction, without gangrene: Secondary | ICD-10-CM

## 2014-09-30 DIAGNOSIS — K439 Ventral hernia without obstruction or gangrene: Secondary | ICD-10-CM | POA: Diagnosis not present

## 2014-09-30 DIAGNOSIS — M47896 Other spondylosis, lumbar region: Secondary | ICD-10-CM | POA: Insufficient documentation

## 2014-09-30 DIAGNOSIS — M160 Bilateral primary osteoarthritis of hip: Secondary | ICD-10-CM | POA: Diagnosis not present

## 2014-09-30 MED ORDER — IOHEXOL 350 MG/ML SOLN
100.0000 mL | Freq: Once | INTRAVENOUS | Status: AC | PRN
  Administered 2014-09-30: 100 mL via INTRAVENOUS

## 2014-10-06 ENCOUNTER — Ambulatory Visit: Payer: Self-pay | Admitting: Surgery

## 2014-10-06 ENCOUNTER — Encounter: Payer: Self-pay | Admitting: *Deleted

## 2014-10-06 DIAGNOSIS — Z720 Tobacco use: Secondary | ICD-10-CM | POA: Insufficient documentation

## 2014-10-07 ENCOUNTER — Ambulatory Visit (INDEPENDENT_AMBULATORY_CARE_PROVIDER_SITE_OTHER): Payer: 59 | Admitting: Surgery

## 2014-10-07 ENCOUNTER — Encounter: Payer: Self-pay | Admitting: Surgery

## 2014-10-07 VITALS — BP 138/72 | HR 64 | Temp 98.2°F | Wt 280.0 lb

## 2014-10-07 DIAGNOSIS — K439 Ventral hernia without obstruction or gangrene: Secondary | ICD-10-CM | POA: Insufficient documentation

## 2014-10-07 DIAGNOSIS — K432 Incisional hernia without obstruction or gangrene: Secondary | ICD-10-CM | POA: Diagnosis not present

## 2014-10-07 DIAGNOSIS — R7303 Prediabetes: Secondary | ICD-10-CM | POA: Insufficient documentation

## 2014-10-07 NOTE — Progress Notes (Signed)
Subjective:     Patient ID: Selena Rodgers, female   DOB: [birth year], 64 y.o.   MRN: [id number]  HPI  64 yr old with multiple medical issues including well controlled DM, HTN, obesity, and degenerative arthritis with recurrent ventral hernia.  She had incarceration about a year ago with resection and vicryl mesh placement which subsequently had an infection and required mesh removal.  She has had a chronic draining wound since then which has continued to improve, does not have much drainage but some purulence occasionally.  Otherwise patient is doing well.     Review of Systems  Constitutional: Positive for fatigue. Negative for fever, chills and appetite change.  Gastrointestinal: Positive for abdominal pain. Negative for nausea, vomiting, diarrhea, constipation and blood in stool.  All other systems reviewed and are negative.  Filed Vitals:   10/07/14 1546  BP: 138/72  Pulse: 64  Temp: 98.2 F (36.8 C)       Objective:   Physical Exam  Constitutional: She is oriented to person, place, and time. She appears well-developed and well-nourished. No distress.  HENT:  Head: Normocephalic and atraumatic.  Right Ear: External ear normal.  Left Ear: External ear normal.  Mouth/Throat: Oropharynx is clear and moist. No oropharyngeal exudate.  Eyes: Conjunctivae are normal. Pupils are equal, round, and reactive to light. No scleral icterus.  Neck: Normal range of motion. Neck supple. No tracheal deviation present.  Abdominal: Soft.  Well healed incision in midline but with small chronic wound, minimal drainage of purulent material, some induration inferior about 3cm in size, large ventral hernia which is reducible but immediately returns  Neurological: She is alert and oriented to person, place, and time.  Skin: Skin is warm and dry. No rash noted. No erythema.  Psychiatric: She has a normal mood and affect. Her behavior is normal. Judgment and thought content normal.  Vitals  reviewed.      Assessment:     64 yr old female with large recurrent ventral hernia     Plan:     I discussed extensively her increased risks due to her other comorbidities, mainly her BMI >35.  I discussed that if she could loose about 20% of her body weight she would become a better candidate, although her risk would still be high.  Also discussed that I would not want to do surgery with a draining wound still.  Patient and family in agreement, given the opportunity to ask questions and have them answered. Will have her return in 2 months time, in which she will attempt to loose about 20lbs and will recheck the wound.

## 2014-12-01 ENCOUNTER — Encounter: Payer: Self-pay | Admitting: Surgery

## 2014-12-03 ENCOUNTER — Ambulatory Visit (INDEPENDENT_AMBULATORY_CARE_PROVIDER_SITE_OTHER): Payer: 59 | Admitting: Surgery

## 2014-12-03 ENCOUNTER — Encounter: Payer: Self-pay | Admitting: Surgery

## 2014-12-03 VITALS — BP 131/81 | HR 62 | Temp 99.1°F | Wt 275.0 lb

## 2014-12-03 DIAGNOSIS — K432 Incisional hernia without obstruction or gangrene: Secondary | ICD-10-CM

## 2014-12-03 NOTE — Patient Instructions (Signed)
Follow up with our office in 2 months. You will receive a call from our office with your appointment time and date.

## 2014-12-03 NOTE — Progress Notes (Signed)
Subjective:     Patient ID: Selena Rodgers, female   DOB: 1950/01/12, 64 y.o.   MRN: [id number]  HPI  64 yr old with multiple medical issues including borderline DM, HTN, obesity, and degenerative arthritis with recurrent ventral hernia. She had incarceration about a year ago with resection and vicryl mesh placement which subsequently had an infection and required mesh removal. She has had a chronic draining wound since then which has continued to improve, patient has not noticed drainage and has stopped having to use bandage.  Additionally she has lost about 5 lbs since her last visit two months ago.     Review of Systems  Constitutional: Negative for fever, chills, activity change and appetite change.  HENT: Negative for congestion and sore throat.   Cardiovascular: Negative for chest pain, palpitations and leg swelling.  Gastrointestinal: Negative for nausea, abdominal pain, diarrhea, constipation, abdominal distention and rectal pain.  Genitourinary: Negative for dysuria.  Musculoskeletal: Positive for back pain and joint swelling.  Hematological: Negative for adenopathy. Does not bruise/bleed easily.  Psychiatric/Behavioral: Negative for agitation. The patient is not nervous/anxious.        Filed Vitals:   12/03/14 1647  BP: 131/81  Pulse: 62  Temp: 99.1 F (37.3 C)    Objective:   Physical Exam  Constitutional: She is oriented to person, place, and time. She appears well-developed and well-nourished. No distress.  Cardiovascular: Normal rate, regular rhythm, normal heart sounds and intact distal pulses.   Pulmonary/Chest: Effort normal. No respiratory distress. She has no wheezes.  Abdominal: Soft. Bowel sounds are normal. She exhibits distension. There is no tenderness. There is no rebound.  Large about 10cm ventral hernia defect, reducible but immediately recurs.  Small punctate area of drainage around previous incision site with area of induration much improved only about  1cm in size now, drainage produced with palpation of induration  Neurological: She is alert and oriented to person, place, and time. No cranial nerve deficit.  Skin: Skin is warm and dry. No erythema.  Scaly patchy rash along abdominal wall and legs  Psychiatric: She has a normal mood and affect. Her behavior is normal. Judgment and thought content normal.  Vitals reviewed.      Assessment:     64 yr old female with large recurrent ventral hernia    Plan:     I discussed that draining area improving and good progression with 5lb weight loss over the past 2 months.  I discussed that in order to repair this an abdominal component release would be more appropriate which is a very large operation taking about 6-8 hours to complete.  I explained that this would be a very risky undertaking that would likely put her in the hospital for at least a week and out of work for about 6 weeks.  I would not do it while there is still a draining cavity either.  Patient understands and will be thinking about whether she would like to take on these risks.  Will have her come back to 2 months.

## 2015-02-04 ENCOUNTER — Telehealth: Payer: Self-pay | Admitting: Surgery

## 2015-02-04 NOTE — Telephone Encounter (Signed)
-----   Message from Reece Packer, RN sent at 02/03/2015 11:03 AM EST ----- Please schedule patient to come back in and consult with Dr. Azalee Course about surgery for Large ventral hernia.  Next available appointment is fine, no rush on this

## 2015-02-04 NOTE — Telephone Encounter (Signed)
I attempted to call patient to schedule her appointment with Dr Azalee Course regarding surgery for large ventral hernia. Patient was at work, but I spoke with her sister, Selena Rodgers, who is authorized (per patients chart) to discuss patients personal health information. Joycelyn Schmid told me that at this time Selena has decided to wait to have/schedule surgery for large ventral hernia. I told her that is fine and whenever she may decide to go through with surgery to give [place] a call and we'll get her an appointment scheduled to come in and see Dr Azalee Course. Patients sister acknowledged understanding of this.

## 2015-02-04 NOTE — Telephone Encounter (Signed)
Noted! Thank you

## 2015-03-01 ENCOUNTER — Emergency Department
Admission: EM | Admit: 2015-03-01 | Discharge: 2015-03-01 | Disposition: A | Discharge status: Home / Self Care | Payer: 59 | Attending: Emergency Medicine | Admitting: Emergency Medicine

## 2015-03-01 ENCOUNTER — Emergency Department: Payer: 59

## 2015-03-01 DIAGNOSIS — Z87891 Personal history of nicotine dependence: Secondary | ICD-10-CM | POA: Diagnosis not present

## 2015-03-01 DIAGNOSIS — Z79899 Other long term (current) drug therapy: Secondary | ICD-10-CM | POA: Insufficient documentation

## 2015-03-01 DIAGNOSIS — M25551 Pain in right hip: Secondary | ICD-10-CM | POA: Diagnosis not present

## 2015-03-01 DIAGNOSIS — Z91040 Latex allergy status: Secondary | ICD-10-CM | POA: Insufficient documentation

## 2015-03-01 DIAGNOSIS — T50905A Adverse effect of unspecified drugs, medicaments and biological substances, initial encounter: Secondary | ICD-10-CM | POA: Insufficient documentation

## 2015-03-01 DIAGNOSIS — E099 Drug or chemical induced diabetes mellitus without complications: Secondary | ICD-10-CM | POA: Diagnosis not present

## 2015-03-01 DIAGNOSIS — M1611 Unilateral primary osteoarthritis, right hip: Secondary | ICD-10-CM | POA: Diagnosis not present

## 2015-03-01 DIAGNOSIS — Z791 Long term (current) use of non-steroidal anti-inflammatories (NSAID): Secondary | ICD-10-CM | POA: Insufficient documentation

## 2015-03-01 MED ORDER — CYCLOBENZAPRINE HCL 10 MG PO TABS
5.0000 mg | ORAL_TABLET | Freq: Once | ORAL | Status: AC
  Administered 2015-03-01: 5 mg via ORAL
  Filled 2015-03-01: qty 1

## 2015-03-01 MED ORDER — CYCLOBENZAPRINE HCL 10 MG PO TABS: 10.0000 mg | ORAL_TABLET | Freq: Three times a day (TID) | ORAL | Status: DC | PRN

## 2015-03-01 MED ORDER — OXYCODONE HCL 5 MG PO CAPS: 5.0000 mg | ORAL_CAPSULE | ORAL | Status: DC | PRN

## 2015-03-01 NOTE — ED Provider Notes (Signed)
[facility] Emergency Department Provider Note  ____________________________________________  Time seen: Approximately 8:14 PM  I have reviewed the triage vital signs and the nursing notes.   HISTORY  Chief Complaint Hip Pain    HPI Selena Rodgers is a 65 y.o. female presents with sudden onset of right hip and pelvic pain. Patient states this started today right after work with a radiation and stabbing sensation going down into the  right leg. Denies any direct trauma has a past medical history of chronic back pain spinal stenosis and arthritis. He reports working all day and then this afternoon after work is when the symptoms started coming on. Symptoms are worsened with working   Past Medical History  Diagnosis Date  . Spinal stenosis   . Chronic back pain   . Arthritis   . Umbilical hernia   . Ventral hernia     Patient Active Problem List   Diagnosis Date Noted  . 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 Ventral hernia 10/07/2014  . Current tobacco use 10/06/2014  . Anxiety 09/17/2014  . Back pain, chronic 09/17/2014  . Clinical depression 09/17/2014  . BP (high blood pressure) 09/17/2014  . Borderline diabetes 09/17/2014  . Arthritis, degenerative 09/17/2014  . Spinal stenosis 09/17/2014  . Compulsive tobacco user syndrome 09/17/2014  . Abdominal mass 06/30/2014  . Incisional hernia, without obstruction or gangrene 06/30/2014  . Morbid obesity ([place]) 06/30/2014  . Hypercholesteremia 05/23/2013  . 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 Hypercholesterolemia 05/23/2013    Past Surgical History  Procedure Laterality Date  . Cholecystectomy    . Breast biopsy    . Foot surgery Right   . Carpal tunnel release    . Umbilical hernia repair    . Ventral hernia repair  12/26/2013    Dr. Pat Patrick  . Irrigation and debridement abdomen  01/10/2014    Dr. Marina Gravel    Current Outpatient Rx  Name  Route  Sig  Dispense  Refill  . citalopram  (CELEXA) 20 MG tablet   Oral   Take 1 tablet by mouth daily.         . cyclobenzaprine (FLEXERIL) 10 MG tablet   Oral   Take 1 tablet (10 mg total) by mouth every 8 (eight) hours as needed for muscle spasms.   30 tablet   0   . esomeprazole (NEXIUM) 40 MG capsule   Oral   Take 40 mg by mouth daily at 12 noon.         . naproxen (EC NAPROSYN) 500 MG EC tablet   Oral   Take 1 tablet by mouth 2 (two) times daily.         Marland Kitchen oxycodone (OXY-IR) 5 MG capsule   Oral   Take 1 capsule (5 mg total) by mouth every 4 (four) hours as needed.   8 capsule   0     Allergies Other; Codeine; Flax seed oil; Latex; Tape; 2,4-d dimethylamine (amisol); Acetaminophen; and Aspirin  Family History  Problem Relation Age of Onset  . Diabetes Maternal Grandmother   . Diabetes Maternal Grandfather   . Diabetes Paternal Grandmother   . Diabetes Paternal Grandfather   . Hypertension Mother     Social History Social History  Substance Use Topics  . Smoking status: Former Smoker    Quit date: 01/02/2014  . Smokeless tobacco: Never Used  . Alcohol Use: No    Review of Systems Constitutional: No fever/chills Eyes: No visual changes. ENT:  No sore throat. Cardiovascular: Denies chest pain. Respiratory: Denies shortness of breath. Gastrointestinal: No abdominal pain.  No nausea, no vomiting.  No diarrhea.  No constipation. Genitourinary: Negative for dysuria. Musculoskeletal: Negative for back pain. Skin: Negative for rash. Neurological: Negative for headaches, focal weakness or numbness.  10-point ROS otherwise negative.  ____________________________________________   PHYSICAL EXAM:  VITAL SIGNS: ED Triage Vitals  Enc Vitals Group     BP 03/01/15 1929 151/83 mmHg     Pulse Rate 03/01/15 1929 77     Resp 03/01/15 1929 18     Temp 03/01/15 1929 98.5 F (36.9 C)     Temp Source 03/01/15 1929 Oral     SpO2 03/01/15 1929 98 %     Weight 03/01/15 1929 270 lb (122.471 kg)      Height 03/01/15 1929 5\' 7"  (1.702 m)     Head Cir --      Peak Flow --      Pain Score 03/01/15 1929 5     Pain Loc --      Pain Edu? --      Excl. in [place]? --     Constitutional: Alert and oriented. Well appearing and in no acute distress.   Cardiovascular: Normal rate, regular rhythm. Grossly normal heart sounds.  Good peripheral circulation. Respiratory: Normal respiratory effort.  No retractions. Lungs CTAB. Musculoskeletal: No lower extremity tenderness nor edema.  No joint effusions. Point tenderness noted to the right anterolateral aspect of the hip. Distally neurovascularly intact. Neurologic:  Normal speech and language. No gross focal neurologic deficits are appreciated. No gait instability. Skin:  Skin is warm, dry and intact. No rash noted. Psychiatric: Mood and affect are normal. Speech and behavior are normal.  ____________________________________________   LABS (all labs ordered are listed, but only abnormal results are displayed)  Labs Reviewed - No data to display  RADIOLOGY  IMPRESSION: 1. No fracture or acute finding. 2. Mild hip joint degenerative changes. ____________________________________________   PROCEDURES  Procedure(s) performed: None  Critical Care performed: No  ____________________________________________   INITIAL IMPRESSION / ASSESSMENT AND PLAN / ED COURSE  Pertinent labs & imaging results that were available during my care of the patient were reviewed by me and considered in my medical decision making (see chart for details).  Acute arthritic and degenerative changes of the right hip. Rx given for Flexeril 5 mg 3 times a day and kidney Naprosyn over-the-counter or by prescription from PCP. Return to ER with any worsening symptomology. ____________________________________________   FINAL CLINICAL IMPRESSION(S) / ED DIAGNOSES  Final diagnoses:  Hip pain, acute, right     This chart was dictated using voice recognition  software/Dragon. Despite best efforts to proofread, errors can occur which can change the meaning. Any change was purely unintentional.   Arlyss Repress, PA-C 03/01/15 2109  Earleen Newport, MD 03/01/15 2220

## 2015-03-01 NOTE — ED Notes (Signed)
Pt arrived to ED with c/o right hip pain. Pt reports PMH of arthritis. Pt c/o sharp pain that stabs and radiates into leg with a "heat sensation". Pt denies injury.

## 2015-03-01 NOTE — Discharge Instructions (Signed)
Heat Therapy Heat therapy can help ease sore, stiff, injured, and tight muscles and joints. Heat relaxes your muscles, which may help ease your pain.  RISKS AND COMPLICATIONS If you have any of the following conditions, do not use heat therapy unless your health care provider has approved:  Poor circulation.  Healing wounds or scarred skin in the area being treated.  Diabetes, heart disease, or high blood pressure.  Not being able to feel (numbness) the area being treated.  Unusual swelling of the area being treated.  Active infections.  Blood clots.  Cancer.  Inability to communicate pain. This may include young children and people who have problems with their brain function (dementia).  Pregnancy. Heat therapy should only be used on old, pre-existing, or long-lasting (chronic) injuries. Do not use heat therapy on new injuries unless directed by your health care provider. HOW TO USE HEAT THERAPY There are several different kinds of heat therapy, including:  Moist heat pack.  Warm water bath.  Hot water bottle.  Electric heating pad.  Heated gel pack.  Heated wrap.  Electric heating pad. Use the heat therapy method suggested by your health care provider. Follow your health care provider's instructions on when and how to use heat therapy. GENERAL HEAT THERAPY RECOMMENDATIONS  Do not sleep while using heat therapy. Only use heat therapy while you are awake.  Your skin may turn pink while using heat therapy. Do not use heat therapy if your skin turns red.  Do not use heat therapy if you have new pain.  High heat or long exposure to heat can cause burns. Be careful when using heat therapy to avoid burning your skin.  Do not use heat therapy on areas of your skin that are already irritated, such as with a rash or sunburn. SEEK MEDICAL CARE IF:  You have blisters, redness, swelling, or numbness.  You have new pain.  Your pain is worse. MAKE SURE  YOU:  Understand these instructions.  Will watch your condition.  Will get help right away if you are not doing well or get worse.   This information is not intended to replace advice given to you by your health care provider. Make sure you discuss any questions you have with your health care provider.   Document Released: 03/13/2011 Document Revised: 01/09/2014 Document Reviewed: 02/11/2013 Elsevier Interactive Patient Education 2016 Elsevier Inc.  Hip Pain Your hip is the joint between your upper legs and your lower pelvis. The bones, cartilage, tendons, and muscles of your hip joint perform a lot of work each day supporting your body weight and allowing you to move around. Hip pain can range from a minor ache to severe pain in one or both of your hips. Pain may be felt on the inside of the hip joint near the groin, or the outside near the buttocks and upper thigh. You may have swelling or stiffness as well.  HOME CARE INSTRUCTIONS   Take medicines only as directed by your health care provider.  Apply ice to the injured area:  Put ice in a plastic bag.  Place a towel between your skin and the bag.  Leave the ice on for 15-20 minutes at a time, 3-4 times a day.  Keep your leg raised (elevated) when possible to lessen swelling.  Avoid activities that cause pain.  Follow specific exercises as directed by your health care provider.  Sleep with a pillow between your legs on your most comfortable side.  Record how often  you have hip pain, the location of the pain, and what it feels like. SEEK MEDICAL CARE IF:   You are unable to put weight on your leg.  Your hip is red or swollen or very tender to touch.  Your pain or swelling continues or worsens after 1 week.  You have increasing difficulty walking.  You have a fever. SEEK IMMEDIATE MEDICAL CARE IF:   You have fallen.  You have a sudden increase in pain and swelling in your hip. MAKE SURE YOU:   Understand these  instructions.  Will watch your condition.  Will get help right away if you are not doing well or get worse.   This information is not intended to replace advice given to you by your health care provider. Make sure you discuss any questions you have with your health care provider.   Document Released: 06/08/2009 Document Revised: 01/09/2014 Document Reviewed: 08/15/2012 Elsevier Interactive Patient Education [employer].

## 2015-03-22 DIAGNOSIS — E538 Deficiency of other specified B group vitamins: Secondary | ICD-10-CM | POA: Diagnosis not present

## 2015-03-22 DIAGNOSIS — M4806 Spinal stenosis, lumbar region: Secondary | ICD-10-CM | POA: Diagnosis not present

## 2015-03-22 DIAGNOSIS — R7302 Impaired glucose tolerance (oral): Secondary | ICD-10-CM | POA: Diagnosis not present

## 2015-03-22 DIAGNOSIS — R202 Paresthesia of skin: Secondary | ICD-10-CM | POA: Diagnosis not present

## 2015-03-22 DIAGNOSIS — I10 Essential (primary) hypertension: Secondary | ICD-10-CM | POA: Diagnosis not present

## 2015-03-24 DIAGNOSIS — M47812 Spondylosis without myelopathy or radiculopathy, cervical region: Secondary | ICD-10-CM | POA: Diagnosis not present

## 2015-03-24 DIAGNOSIS — M47816 Spondylosis without myelopathy or radiculopathy, lumbar region: Secondary | ICD-10-CM | POA: Diagnosis not present

## 2015-03-24 DIAGNOSIS — M4806 Spinal stenosis, lumbar region: Secondary | ICD-10-CM | POA: Diagnosis not present

## 2015-04-07 DIAGNOSIS — M4802 Spinal stenosis, cervical region: Secondary | ICD-10-CM | POA: Diagnosis not present

## 2015-04-07 DIAGNOSIS — M4712 Other spondylosis with myelopathy, cervical region: Secondary | ICD-10-CM | POA: Diagnosis not present

## 2015-04-08 ENCOUNTER — Other Ambulatory Visit: Payer: Self-pay | Admitting: Neurosurgery

## 2015-04-13 ENCOUNTER — Encounter (HOSPITAL_COMMUNITY)
Admission: RE | Admit: 2015-04-13 | Discharge: 2015-04-13 | Disposition: A | Discharge status: Home / Self Care | Payer: 59 | Source: Ambulatory Visit | Attending: Neurosurgery | Admitting: Neurosurgery

## 2015-04-13 ENCOUNTER — Encounter (HOSPITAL_COMMUNITY): Payer: Self-pay

## 2015-04-13 DIAGNOSIS — G959 Disease of spinal cord, unspecified: Secondary | ICD-10-CM | POA: Insufficient documentation

## 2015-04-13 DIAGNOSIS — Z01812 Encounter for preprocedural laboratory examination: Secondary | ICD-10-CM | POA: Diagnosis not present

## 2015-04-13 LAB — CBC
HCT: 39.5 % (ref 36.0–46.0)
Hemoglobin: 13.3 g/dL (ref 12.0–15.0)
MCH: 29.9 pg (ref 26.0–34.0)
MCHC: 33.7 g/dL (ref 30.0–36.0)
MCV: 88.8 fL (ref 78.0–100.0)
PLATELETS: 266 10*3/uL (ref 150–400)
RBC: 4.45 MIL/uL (ref 3.87–5.11)
RDW: 13.5 % (ref 11.5–15.5)
WBC: 8.3 10*3/uL (ref 4.0–10.5)

## 2015-04-13 LAB — BASIC METABOLIC PANEL
ANION GAP: 11 (ref 5–15)
BUN: 12 mg/dL (ref 6–20)
CALCIUM: 9.7 mg/dL (ref 8.9–10.3)
CO2: 23 mmol/L (ref 22–32)
CREATININE: 0.69 mg/dL (ref 0.44–1.00)
Chloride: 105 mmol/L (ref 101–111)
GFR calc Af Amer: >60 mL/min (ref >60)
GFR calc non Af Amer: >60 mL/min (ref >60)
Glucose, Bld: 87 mg/dL (ref 65–99)
Potassium: 4.6 mmol/L (ref 3.5–5.1)
Sodium: 139 mmol/L (ref 135–145)

## 2015-04-13 LAB — SURGICAL PCR SCREEN
MRSA, PCR: NEGATIVE
STAPHYLOCOCCUS AUREUS: POSITIVE — AB

## 2015-04-13 NOTE — Progress Notes (Signed)
Pt denies any cardiac history, cardiologist or heart studies.   PCP Dr Vonzella Nipple at [facility] in [place]. Last seen in December.

## 2015-04-13 NOTE — Progress Notes (Signed)
I called a prescription for Mupirocin ointment to [place], [place], [place]

## 2015-04-13 NOTE — Pre-Procedure Instructions (Signed)
    Selena Rodgers  04/13/2015      [facility] [address] [place])  [address] Phone: [phone] Fax: [phone]    Your procedure is scheduled on Monday April 17th   Report to [facility] Admitting at 8:00am.  Call this number if you have problems the morning of surgery: [phone]  If questions prior to surgery date call [phone] between 8 am and 4 pm   Remember:  Do not eat food or drink liquids after midnight.  Take these medicines the morning of surgery with A SIP OF WATER: celexa (citalopram), flexeril if needed, omeprazole (Prilosec), pain pill if needed  Stop taking Naproxen and vit B-12 along with any other anit-inflammatories such as Aleve, Motrin, Ibuprofen, Advil, and any other vitamin or herbal supplements. Do not take Aspirin, Goodies or BC powder   Do not wear jewelry, make-up or nail polish.  Do not wear lotions, powders, or perfumes.  You may not wear deodorant.  Do not shave 48 hours prior to surgery.    Do not bring valuables to the hospital.  [facility] is not responsible for any belongings or valuables.  Contacts, dentures or bridgework may not be worn into surgery.  Leave your suitcase in the car.  After surgery it may be brought to your room.  For patients admitted to the hospital, discharge time will be determined by your treatment team.  Special instructions:  Shower with CHG as instructed the night before and morning of surgery  Please read over the following fact sheets that you were given. Pain Booklet, Coughing and Deep Breathing, MRSA Information and Surgical Site Infection Prevention

## 2015-04-16 MED ORDER — DEXTROSE 5 % IV SOLN
3.0000 g | INTRAVENOUS | Status: AC
  Administered 2015-04-19: 3 g via INTRAVENOUS
  Filled 2015-04-16: qty 3000

## 2015-04-19 ENCOUNTER — Encounter (HOSPITAL_COMMUNITY)
Admission: RE | Disposition: A | Discharge status: Rehabilitation Facility | Payer: Self-pay | Source: Ambulatory Visit | Attending: Neurosurgery

## 2015-04-19 ENCOUNTER — Encounter (HOSPITAL_COMMUNITY): Payer: Self-pay | Admitting: *Deleted

## 2015-04-19 ENCOUNTER — Ambulatory Visit (HOSPITAL_COMMUNITY): Payer: 59

## 2015-04-19 ENCOUNTER — Observation Stay (HOSPITAL_COMMUNITY)
Admission: RE | Admit: 2015-04-19 | Discharge: 2015-04-21 | Disposition: A | Discharge status: Rehabilitation Facility | Payer: 59 | Source: Ambulatory Visit | Attending: Neurosurgery | Admitting: Neurosurgery

## 2015-04-19 ENCOUNTER — Ambulatory Visit (HOSPITAL_COMMUNITY): Payer: 59 | Admitting: Vascular Surgery

## 2015-04-19 ENCOUNTER — Ambulatory Visit (HOSPITAL_COMMUNITY): Payer: 59 | Admitting: Anesthesiology

## 2015-04-19 DIAGNOSIS — M5031 Other cervical disc degeneration,  high cervical region: Secondary | ICD-10-CM | POA: Diagnosis not present

## 2015-04-19 DIAGNOSIS — Z87891 Personal history of nicotine dependence: Secondary | ICD-10-CM | POA: Insufficient documentation

## 2015-04-19 DIAGNOSIS — K5900 Constipation, unspecified: Secondary | ICD-10-CM | POA: Insufficient documentation

## 2015-04-19 DIAGNOSIS — M5001 Cervical disc disorder with myelopathy,  high cervical region: Secondary | ICD-10-CM | POA: Insufficient documentation

## 2015-04-19 DIAGNOSIS — G8929 Other chronic pain: Secondary | ICD-10-CM | POA: Insufficient documentation

## 2015-04-19 DIAGNOSIS — M4802 Spinal stenosis, cervical region: Secondary | ICD-10-CM | POA: Diagnosis not present

## 2015-04-19 DIAGNOSIS — I158 Other secondary hypertension: Secondary | ICD-10-CM | POA: Insufficient documentation

## 2015-04-19 DIAGNOSIS — M1990 Unspecified osteoarthritis, unspecified site: Secondary | ICD-10-CM | POA: Diagnosis not present

## 2015-04-19 DIAGNOSIS — G8918 Other acute postprocedural pain: Secondary | ICD-10-CM | POA: Insufficient documentation

## 2015-04-19 DIAGNOSIS — Z6841 Body Mass Index (BMI) 40.0 and over, adult: Secondary | ICD-10-CM | POA: Insufficient documentation

## 2015-04-19 DIAGNOSIS — M4712 Other spondylosis with myelopathy, cervical region: Secondary | ICD-10-CM | POA: Diagnosis present

## 2015-04-19 DIAGNOSIS — M50021 Cervical disc disorder at C4-C5 level with myelopathy: Secondary | ICD-10-CM | POA: Diagnosis not present

## 2015-04-19 DIAGNOSIS — Z419 Encounter for procedure for purposes other than remedying health state, unspecified: Secondary | ICD-10-CM

## 2015-04-19 DIAGNOSIS — I10 Essential (primary) hypertension: Secondary | ICD-10-CM | POA: Diagnosis not present

## 2015-04-19 DIAGNOSIS — M4322 Fusion of spine, cervical region: Secondary | ICD-10-CM | POA: Diagnosis not present

## 2015-04-19 DIAGNOSIS — M549 Dorsalgia, unspecified: Secondary | ICD-10-CM | POA: Diagnosis not present

## 2015-04-19 HISTORY — PX: ANTERIOR CERVICAL DECOMP/DISCECTOMY FUSION: SHX1161

## 2015-04-19 SURGERY — ANTERIOR CERVICAL DECOMPRESSION/DISCECTOMY FUSION 2 LEVELS
Anesthesia: General

## 2015-04-19 MED ORDER — MIDAZOLAM HCL 5 MG/5ML IJ SOLN
INTRAMUSCULAR | Status: DC | PRN
  Administered 2015-04-19: 2 mg via INTRAVENOUS

## 2015-04-19 MED ORDER — ONDANSETRON HCL 4 MG/2ML IJ SOLN
INTRAMUSCULAR | Status: AC
  Filled 2015-04-19: qty 2

## 2015-04-19 MED ORDER — MORPHINE SULFATE (PF) 2 MG/ML IV SOLN
1.0000 mg | INTRAVENOUS | Status: DC | PRN
  Administered 2015-04-19 – 2015-04-20 (×3): 2 mg via INTRAVENOUS
  Filled 2015-04-19 (×3): qty 1

## 2015-04-19 MED ORDER — PHENYLEPHRINE HCL 10 MG/ML IJ SOLN
10.0000 mg | INTRAVENOUS | Status: DC | PRN
  Administered 2015-04-19: 20 ug/min via INTRAVENOUS

## 2015-04-19 MED ORDER — LACTATED RINGERS IV SOLN: INTRAVENOUS | Status: DC

## 2015-04-19 MED ORDER — ROCURONIUM BROMIDE 100 MG/10ML IV SOLN
INTRAVENOUS | Status: DC | PRN
  Administered 2015-04-19: 50 mg via INTRAVENOUS

## 2015-04-19 MED ORDER — ARTIFICIAL TEARS OP OINT
TOPICAL_OINTMENT | OPHTHALMIC | Status: DC | PRN
  Administered 2015-04-19: 1 via OPHTHALMIC

## 2015-04-19 MED ORDER — CITALOPRAM HYDROBROMIDE 20 MG PO TABS
20.0000 mg | ORAL_TABLET | Freq: Every day | ORAL | Status: DC
  Administered 2015-04-19 – 2015-04-21 (×3): 20 mg via ORAL
  Filled 2015-04-19 (×3): qty 1

## 2015-04-19 MED ORDER — MIDAZOLAM HCL 2 MG/2ML IJ SOLN: 0.5000 mg | Freq: Once | INTRAMUSCULAR | Status: DC | PRN

## 2015-04-19 MED ORDER — ONDANSETRON HCL 4 MG/2ML IJ SOLN
INTRAMUSCULAR | Status: DC | PRN
  Administered 2015-04-19: 4 mg via INTRAVENOUS

## 2015-04-19 MED ORDER — FENTANYL CITRATE (PF) 250 MCG/5ML IJ SOLN
INTRAMUSCULAR | Status: AC
  Filled 2015-04-19: qty 5

## 2015-04-19 MED ORDER — LIDOCAINE HCL (CARDIAC) 20 MG/ML IV SOLN
INTRAVENOUS | Status: AC
  Filled 2015-04-19: qty 5

## 2015-04-19 MED ORDER — DOCUSATE SODIUM 100 MG PO CAPS
100.0000 mg | ORAL_CAPSULE | Freq: Two times a day (BID) | ORAL | Status: DC
  Administered 2015-04-19 – 2015-04-21 (×4): 100 mg via ORAL
  Filled 2015-04-19 (×4): qty 1

## 2015-04-19 MED ORDER — 0.9 % SODIUM CHLORIDE (POUR BTL) OPTIME
TOPICAL | Status: DC | PRN
  Administered 2015-04-19: 1000 mL

## 2015-04-19 MED ORDER — BACITRACIN 50000 UNITS IM SOLR
INTRAMUSCULAR | Status: DC | PRN
  Administered 2015-04-19: 11:00:00

## 2015-04-19 MED ORDER — BUPIVACAINE-EPINEPHRINE (PF) 0.5% -1:200000 IJ SOLN
INTRAMUSCULAR | Status: DC | PRN
  Administered 2015-04-19: 10 mL via PERINEURAL

## 2015-04-19 MED ORDER — MEPERIDINE HCL 25 MG/ML IJ SOLN: 6.2500 mg | INTRAMUSCULAR | Status: DC | PRN

## 2015-04-19 MED ORDER — OXYCODONE HCL 5 MG PO TABS
5.0000 mg | ORAL_TABLET | ORAL | Status: DC | PRN
  Administered 2015-04-20 – 2015-04-21 (×5): 5 mg via ORAL
  Filled 2015-04-19 (×5): qty 1

## 2015-04-19 MED ORDER — THROMBIN 5000 UNITS EX SOLR
CUTANEOUS | Status: DC | PRN
  Administered 2015-04-19 (×2): 5000 [IU] via TOPICAL

## 2015-04-19 MED ORDER — HEMOSTATIC AGENTS (NO CHARGE) OPTIME
TOPICAL | Status: DC | PRN
  Administered 2015-04-19: 1 via TOPICAL

## 2015-04-19 MED ORDER — SUGAMMADEX SODIUM 200 MG/2ML IV SOLN
INTRAVENOUS | Status: DC | PRN
  Administered 2015-04-19: 200 mg via INTRAVENOUS

## 2015-04-19 MED ORDER — VECURONIUM BROMIDE 10 MG IV SOLR
INTRAVENOUS | Status: DC | PRN
  Administered 2015-04-19 (×2): 2 mg via INTRAVENOUS
  Administered 2015-04-19 (×2): 1 mg via INTRAVENOUS

## 2015-04-19 MED ORDER — CYCLOBENZAPRINE HCL 10 MG PO TABS
10.0000 mg | ORAL_TABLET | Freq: Three times a day (TID) | ORAL | Status: DC | PRN
  Filled 2015-04-19 (×2): qty 1

## 2015-04-19 MED ORDER — ROCURONIUM BROMIDE 50 MG/5ML IV SOLN
INTRAVENOUS | Status: AC
  Filled 2015-04-19: qty 1

## 2015-04-19 MED ORDER — PANTOPRAZOLE SODIUM 40 MG PO TBEC
80.0000 mg | DELAYED_RELEASE_TABLET | Freq: Every day | ORAL | Status: DC
  Administered 2015-04-19 – 2015-04-21 (×3): 80 mg via ORAL
  Filled 2015-04-19 (×3): qty 2

## 2015-04-19 MED ORDER — ALUM & MAG HYDROXIDE-SIMETH 200-200-20 MG/5ML PO SUSP: 30.0000 mL | Freq: Four times a day (QID) | ORAL | Status: DC | PRN

## 2015-04-19 MED ORDER — SUGAMMADEX SODIUM 200 MG/2ML IV SOLN
INTRAVENOUS | Status: AC
  Filled 2015-04-19: qty 2

## 2015-04-19 MED ORDER — MENTHOL 3 MG MT LOZG
1.0000 | LOZENGE | OROMUCOSAL | Status: DC | PRN
  Administered 2015-04-21: 3 mg via ORAL
  Filled 2015-04-19: qty 9

## 2015-04-19 MED ORDER — BISACODYL 10 MG RE SUPP: 10.0000 mg | Freq: Every day | RECTAL | Status: DC | PRN

## 2015-04-19 MED ORDER — LIDOCAINE HCL (CARDIAC) 20 MG/ML IV SOLN
INTRAVENOUS | Status: DC | PRN
  Administered 2015-04-19: 40 mg via INTRAVENOUS

## 2015-04-19 MED ORDER — HYDROMORPHONE HCL 1 MG/ML IJ SOLN
0.2500 mg | INTRAMUSCULAR | Status: DC | PRN
  Administered 2015-04-19 (×2): 0.5 mg via INTRAVENOUS

## 2015-04-19 MED ORDER — LACTATED RINGERS IV SOLN
INTRAVENOUS | Status: DC
  Administered 2015-04-19 (×2): via INTRAVENOUS

## 2015-04-19 MED ORDER — ONDANSETRON HCL 4 MG/2ML IJ SOLN: 4.0000 mg | INTRAMUSCULAR | Status: DC | PRN

## 2015-04-19 MED ORDER — DEXAMETHASONE SODIUM PHOSPHATE 4 MG/ML IJ SOLN
4.0000 mg | Freq: Four times a day (QID) | INTRAMUSCULAR | Status: AC
  Administered 2015-04-20 (×2): 4 mg via INTRAVENOUS
  Filled 2015-04-19 (×2): qty 1

## 2015-04-19 MED ORDER — CEFAZOLIN SODIUM-DEXTROSE 2-4 GM/100ML-% IV SOLN
2.0000 g | Freq: Three times a day (TID) | INTRAVENOUS | Status: AC
  Administered 2015-04-19 – 2015-04-20 (×2): 2 g via INTRAVENOUS
  Filled 2015-04-19 (×2): qty 100

## 2015-04-19 MED ORDER — FENTANYL CITRATE (PF) 100 MCG/2ML IJ SOLN
INTRAMUSCULAR | Status: DC | PRN
  Administered 2015-04-19: 100 ug via INTRAVENOUS
  Administered 2015-04-19: 150 ug via INTRAVENOUS

## 2015-04-19 MED ORDER — MIDAZOLAM HCL 2 MG/2ML IJ SOLN
INTRAMUSCULAR | Status: AC
  Filled 2015-04-19: qty 2

## 2015-04-19 MED ORDER — HYDROMORPHONE HCL 1 MG/ML IJ SOLN
INTRAMUSCULAR | Status: AC
Start: 2015-04-19 — End: 2015-04-20
  Filled 2015-04-19: qty 1

## 2015-04-19 MED ORDER — PROPOFOL 10 MG/ML IV BOLUS
INTRAVENOUS | Status: DC | PRN
  Administered 2015-04-19: 120 mg via INTRAVENOUS

## 2015-04-19 MED ORDER — PHENOL 1.4 % MT LIQD: 1.0000 | OROMUCOSAL | Status: DC | PRN

## 2015-04-19 MED ORDER — DEXAMETHASONE 4 MG PO TABS
4.0000 mg | ORAL_TABLET | Freq: Four times a day (QID) | ORAL | Status: AC
  Administered 2015-04-19: 4 mg via ORAL
  Filled 2015-04-19: qty 1

## 2015-04-19 MED ORDER — DEXAMETHASONE SODIUM PHOSPHATE 10 MG/ML IJ SOLN
INTRAMUSCULAR | Status: DC | PRN
  Administered 2015-04-19: 10 mg via INTRAVENOUS

## 2015-04-19 SURGICAL SUPPLY — 58 items
BAG DECANTER FOR FLEXI CONT (MISCELLANEOUS) ×2 IMPLANT
BENZOIN TINCTURE PRP APPL 2/3 (GAUZE/BANDAGES/DRESSINGS) ×2 IMPLANT
BIT DRILL NEURO 2X3.1 SFT TUCH (MISCELLANEOUS) ×1 IMPLANT
BLADE SURG 15 STRL LF DISP TIS (BLADE) ×1 IMPLANT
BLADE SURG 15 STRL SS (BLADE) ×1
BLADE ULTRA TIP 2M (BLADE) ×2 IMPLANT
BRUSH SCRUB EZ PLAIN DRY (MISCELLANEOUS) ×2 IMPLANT
BUR BARREL STRAIGHT FLUTE 4.0 (BURR) ×2 IMPLANT
BUR MATCHSTICK NEURO 3.0 LAGG (BURR) ×2 IMPLANT
CANISTER SUCT 3000ML PPV (MISCELLANEOUS) ×2 IMPLANT
COVER MAYO STAND STRL (DRAPES) ×2 IMPLANT
DRAPE LAPAROTOMY 100X72 PEDS (DRAPES) ×2 IMPLANT
DRAPE MICROSCOPE LEICA (MISCELLANEOUS) IMPLANT
DRAPE POUCH INSTRU U-SHP 10X18 (DRAPES) ×2 IMPLANT
DRAPE SURG 17X23 STRL (DRAPES) ×4 IMPLANT
DRILL NEURO 2X3.1 SOFT TOUCH (MISCELLANEOUS) ×2
ELECT REM PT RETURN 9FT ADLT (ELECTROSURGICAL) ×2
ELECTRODE REM PT RTRN 9FT ADLT (ELECTROSURGICAL) ×1 IMPLANT
GAUZE SPONGE 4X4 12PLY STRL (GAUZE/BANDAGES/DRESSINGS) ×2 IMPLANT
GAUZE SPONGE 4X4 16PLY XRAY LF (GAUZE/BANDAGES/DRESSINGS) ×2 IMPLANT
GLOVE BIO SURGEON STRL SZ7.5 (GLOVE) ×4 IMPLANT
GLOVE BIO SURGEON STRL SZ8 (GLOVE) IMPLANT
GLOVE BIO SURGEON STRL SZ8.5 (GLOVE) IMPLANT
GLOVE EXAM NITRILE LRG STRL (GLOVE) IMPLANT
GLOVE EXAM NITRILE MD LF STRL (GLOVE) IMPLANT
GLOVE EXAM NITRILE XL STR (GLOVE) IMPLANT
GLOVE EXAM NITRILE XS STR PU (GLOVE) IMPLANT
GLOVE SURG SS PI 8.0 STRL IVOR (GLOVE) ×2 IMPLANT
GLOVE SURG SS PI 8.5 STRL IVOR (GLOVE) ×1
GLOVE SURG SS PI 8.5 STRL STRW (GLOVE) ×1 IMPLANT
GOWN STRL REUS W/ TWL LRG LVL3 (GOWN DISPOSABLE) IMPLANT
GOWN STRL REUS W/ TWL XL LVL3 (GOWN DISPOSABLE) IMPLANT
GOWN STRL REUS W/TWL LRG LVL3 (GOWN DISPOSABLE)
GOWN STRL REUS W/TWL XL LVL3 (GOWN DISPOSABLE)
KIT BASIN OR (CUSTOM PROCEDURE TRAY) ×2 IMPLANT
KIT ROOM TURNOVER OR (KITS) ×2 IMPLANT
MARKER SKIN DUAL TIP RULER LAB (MISCELLANEOUS) ×2 IMPLANT
NEEDLE HYPO 22GX1.5 SAFETY (NEEDLE) ×2 IMPLANT
NEEDLE SPNL 18GX3.5 QUINCKE PK (NEEDLE) ×2 IMPLANT
NS IRRIG 1000ML POUR BTL (IV SOLUTION) ×2 IMPLANT
PACK LAMINECTOMY NEURO (CUSTOM PROCEDURE TRAY) ×2 IMPLANT
PEEK S VISTA 7X11X14 (Peek) ×4 IMPLANT
PIN DISTRACTION 14MM (PIN) ×4 IMPLANT
PLATE ANT CERV XTEND 2 LV 36 (Plate) ×2 IMPLANT
PUTTY BIOACTIVE 5CC KINEX (Putty) ×2 IMPLANT
RUBBERBAND STERILE (MISCELLANEOUS) IMPLANT
SCREW XTD VAR 4.2 SELF TAP 12 (Screw) IMPLANT
SCREW XTEND SELFTAP VAR 4.6X14 (Screw) ×12 IMPLANT
SPONGE INTESTINAL PEANUT (DISPOSABLE) ×4 IMPLANT
SPONGE SURGIFOAM ABS GEL SZ50 (HEMOSTASIS) ×2 IMPLANT
STRIP CLOSURE SKIN 1/2X4 (GAUZE/BANDAGES/DRESSINGS) ×2 IMPLANT
SUT VIC AB 0 CT1 27 (SUTURE) ×1
SUT VIC AB 0 CT1 27XBRD ANTBC (SUTURE) ×1 IMPLANT
SUT VIC AB 3-0 SH 8-18 (SUTURE) ×2 IMPLANT
TAPE CLOTH SURG 4X10 WHT LF (GAUZE/BANDAGES/DRESSINGS) ×2 IMPLANT
TOWEL OR 17X24 6PK STRL BLUE (TOWEL DISPOSABLE) ×2 IMPLANT
TOWEL OR 17X26 10 PK STRL BLUE (TOWEL DISPOSABLE) ×2 IMPLANT
WATER STERILE IRR 1000ML POUR (IV SOLUTION) ×2 IMPLANT

## 2015-04-19 NOTE — Op Note (Signed)
Brief history: The patient is a 65 year old white female who has complained of difficulty with ambulation, Quadraparesis, hand numbness and weakness, etc. 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 I discussed the situation with the patient. We discussed various treatment options including surgery. She has weighed the risks, benefits, and alternative surgery and decided to proceed with the C3-4 and C4-5 anterior cervical discectomy, fusion, and plating.  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 Earle Gell  Asst.: Dr. Cyndy Freeze  Anesthesia: Gen. endotracheal  Estimated blood loss: 125 mL  Drains: None  Complications: None  Description of procedure: The patient was brought to the operating room by the anesthesia team. General endotracheal anesthesia was induced. A roll was placed under the patient's shoulders to keep the neck in the neutral position. The patient's anterior cervical region was then prepared with Betadine scrub and Betadine solution. Sterile drapes were applied.  The area to be incised was then injected with Marcaine with epinephrine solution. I then used a scalpel to make a transverse incision in the patient's left anterior neck. I used the Metzenbaum scissors to divide the platysmal muscle and then to dissect medial to the sternocleidomastoid muscle, jugular vein, and carotid artery. I carefully dissected down towards the anterior cervical spine identifying the esophagus and  retracting it medially. Then using Kitner swabs to clear soft tissue from the anterior cervical spine. We then inserted a bent spinal needle into the upper exposed intervertebral disc space. We then obtained intraoperative radiographs confirm our location.  I then used electrocautery to detach the medial border of the longus colli muscle bilaterally from the C3-4 and C4-5 intervertebral disc spaces. I then inserted the Caspar self-retaining retractor underneath the longus colli muscle bilaterally to provide exposure.  We then incised the intervertebral disc at C4-5. We then performed a partial intervertebral discectomy with a pituitary forceps and the Karlin curettes. I then inserted distraction screws into the vertebral bodies at C4-5. We then distracted the interspace. We then used the high-speed drill to decorticate the vertebral endplates at [id number], to drill away the remainder of the intervertebral disc, to drill away some posterior spondylosis, and to thin out the posterior longitudinal ligament. I then incised ligament with the arachnoid knife. We then removed the ligament with a Kerrison punches undercutting the vertebral endplates and decompressing the thecal sac. We then performed foraminotomies about the bilateral C5 nerve roots. This completed the decompression at this level.  We then repeated this procedure in analogous fashion at C3-4 decompressing the thecal sac and the bilateral C4 nerve roots.  We now turned our to attention to the interbody fusion. We used the trial spacers to determine the appropriate size for the interbody prosthesis. We then pre-filled prosthesis with a combination of local morcellized autograft bone that we obtained during decompression as well as Kinnex bone graft extender. We then inserted the prosthesis into the distracted interspace at C3-4 and C4-5. We then removed the distraction screws. There was a good snug fit of the prosthesis in the interspace.  Having  completed the fusion we now turned attention to the anterior spinal  instrumentation. We used the high-speed drill to drill away some anterior spondylosis at the disc spaces so that the plate lay down flat. We selected the appropriate length titanium anterior cervical plate. We laid it along the anterior aspect of the vertebral bodies from C3-C5. We then drilled 14 mm mm holes at C3, C4 and C5. We then secured the plate to the vertebral bodies by placing two 14 mm self-tapping screws at C3, C4 and C5. We then obtained intraoperative radiograph. The demonstrating good position of the instrumentation. We therefore secured the screws the plate the locking each cam. This completed the instrumentation.  We then obtained hemostasis using bipolar electrocautery. We irrigated the wound out with bacitracin solution. We then removed the retractor. We inspected the esophagus for any damage. There was none apparent. We then reapproximated patient's platysmal muscle with interrupted 3-0 Vicryl suture. We then reapproximated the subcutaneous tissue with interrupted 3-0 Vicryl suture. The skin was reapproximated with Steri-Strips and benzoin. The wound was then covered with bacitracin ointment. A sterile dressing was applied. The drapes were removed. Patient was subsequently extubated by the anesthesia team and transported to the post anesthesia care unit in stable condition. All sponge instrument and needle counts were reportedly correct at the end of this case.

## 2015-04-19 NOTE — Progress Notes (Signed)
Subjective:  The patient is alert and pleasant. She looks well. She is in no apparent distress.  Objective: Vital signs in last 24 hours: Temp:  [97.6 F (36.4 C)-98.1 F (36.7 C)] 98.1 F (36.7 C) (04/17 1500) Pulse Rate:  [68-86] 86 (04/17 1500) Resp:  [20] 20 (04/17 0907) BP: (142-152)/(63-73) 152/73 mmHg (04/17 1500) SpO2:  [95 %] 95 % (04/17 1500) Weight:  [124.739 kg (275 lb)] 124.739 kg (275 lb) (04/17 0907)  Intake/Output from previous day:   Intake/Output this shift: Total I/O In: 1700 [I.V.:1700] Out: 600 [Urine:400; Blood:200]  Physical exam the patient is somnolent but easily arousable. Her dressing is clean and dry. There is no hematoma or shift. She is moving all 4 extremities.  Lab Results: No results for input(s): WBC, HGB, HCT, PLT in the last 72 hours. BMET No results for input(s): NA, K, CL, CO2, GLUCOSE, BUN, CREATININE, CALCIUM in the last 72 hours.  Studies/Results: Dg Cervical Spine 2-3 Views  04/19/2015  CLINICAL DATA:  Anterior cervical disc fusion. EXAM: CERVICAL SPINE - 2-3 VIEW COMPARISON:  None. FINDINGS: Two intraoperative lateral projections of the cervical spine were obtained. The first image demonstrates surgical localization of C4-5. The second image demonstrates the patient to be undergoing anterior cervical fusion of C3-4 and C4-5. IMPRESSION: Anterior cervical disc fusion of C3-4 and C4-5. Electronically Signed   By: Marijo Conception, M.D.   On: 04/19/2015 14:54    Assessment/Plan: The patient is doing well. I spoke with her family.      Jonanthan Bolender D 04/19/2015, 3:17 PM

## 2015-04-19 NOTE — Progress Notes (Signed)
Pt admitted to 5C17 at this time.  Alert and oriented.  Drsg CDI and aspen collar on.  Pt sister at bedside.  Oriented to room.  Call bell within reach, bed alarm set and patient verbalizes understanding to call before attempting to get OOB.  Pt states she has been using a wheelchair at home and sister has been helping to take care of her.  Her only complaint is right shoulder pain which she stated was pre-op.  Will continue to monitor.

## 2015-04-19 NOTE — Anesthesia Postprocedure Evaluation (Signed)
Anesthesia Post Note  Patient: Selena Rodgers  Procedure(s) Performed: Procedure(s) (LRB): ANTERIOR CERVICAL DECOMPRESSION/DISCECTOMY FUSION INTERBODY PROTHESIS PLATING BONEGRAFT CERVICAL THREE-FOUR ,CERVICAL FOUR-FIVE (N/A)  Patient location during evaluation: PACU Anesthesia Type: General Level of consciousness: awake and alert, oriented and patient cooperative Pain management: pain level controlled Vital Signs Assessment: post-procedure vital signs reviewed and stable Respiratory status: spontaneous breathing, nonlabored ventilation, respiratory function stable and patient connected to nasal cannula oxygen Cardiovascular status: blood pressure returned to baseline and stable Postop Assessment: no signs of nausea or vomiting Anesthetic complications: no    Last Vitals:  Filed Vitals:   04/19/15 1554 04/19/15 1609  BP:  166/75  Pulse: 75 78  Temp: 36.1 C 36.7 C  Resp: 11 16    Last Pain:  Filed Vitals:   04/19/15 1609  PainSc: Asleep                 Aeron Lheureux,E. Jarae Panas

## 2015-04-19 NOTE — Transfer of Care (Signed)
Immediate Anesthesia Transfer of Care Note  Patient: Selena Rodgers  Procedure(s) Performed: Procedure(s): ANTERIOR CERVICAL DECOMPRESSION/DISCECTOMY FUSION INTERBODY PROTHESIS PLATING BONEGRAFT CERVICAL THREE-FOUR ,CERVICAL FOUR-FIVE (N/A)  Patient Location: PACU  Anesthesia Type:General  Level of Consciousness: awake, alert  and oriented  Airway & Oxygen Therapy: Patient Spontanous Breathing and Patient connected to nasal cannula oxygen  Post-op Assessment: Report given to RN, Post -op Vital signs reviewed and stable and Patient moving all extremities X 4  Post vital signs: Reviewed and stable  Last Vitals:  Filed Vitals:   04/19/15 0907 04/19/15 1500  BP: 142/63 152/73  Pulse: 68 86  Temp: 36.4 C 36.7 C  Resp: 20     Complications: No apparent anesthesia complications

## 2015-04-19 NOTE — Anesthesia Preprocedure Evaluation (Addendum)
Anesthesia Evaluation  Patient identified by MRN, date of birth, ID band Patient awake    Reviewed: Allergy & Precautions, NPO status , Patient's Chart, lab work & pertinent test results  History of Anesthesia Complications Negative for: history of anesthetic complications  Airway Mallampati: II  TM Distance: >3 FB Neck ROM: Full    Dental  (+) Missing, Loose, Dental Advisory Given, Chipped   Pulmonary former smoker,    breath sounds clear to auscultation       Cardiovascular hypertension (no longer needs BP meds now that has lost some weight),  Rhythm:Regular Rate:Normal     Neuro/Psych Chronic back pain: narcotics    GI/Hepatic Neg liver ROS, GERD  Controlled,  Endo/Other  Morbid obesity  Renal/GU negative Renal ROS     Musculoskeletal  (+) Arthritis , Osteoarthritis,    Abdominal (+) + obese,   Peds  Hematology   Anesthesia Other Findings   Reproductive/Obstetrics                           Anesthesia Physical Anesthesia Plan  ASA: III  Anesthesia Plan: General   Post-op Pain Management:    Induction: Intravenous  Airway Management Planned: Oral ETT and Video Laryngoscope Planned  Additional Equipment:   Intra-op Plan:   Post-operative Plan: Extubation in OR  Informed Consent: I have reviewed the patients History and Physical, chart, labs and discussed the procedure including the risks, benefits and alternatives for the proposed anesthesia with the patient or authorized representative who has indicated his/her understanding and acceptance.   Dental advisory given  Plan Discussed with: Surgeon and CRNA  Anesthesia Plan Comments: (Plan routine monitors, GETA with VideoGlide intubation (myelopathic))        Anesthesia Quick Evaluation

## 2015-04-19 NOTE — H&P (Signed)
Subjective: The patient is a 65 year old white female who has developed progressive numbness and weakness her hands, Quadraparesis, difficulty with ambulation, etc. She was worked up with a cervical MRI which demonstrated severe spinal stenosis at C3-4 and C4-5. I discussed situation with the patient and recommended surgery. She has decided to proceed with the C3-4 and C4-5 anterior cervical discectomy, fusion, and plating.   Past Medical History  Diagnosis Date  . Spinal stenosis   . Chronic back pain   . Arthritis   . Umbilical hernia   . Ventral hernia     Past Surgical History  Procedure Laterality Date  . Cholecystectomy    . Breast biopsy    . Foot surgery Right   . Carpal tunnel release    . Umbilical hernia repair    . Ventral hernia repair  12/26/2013    Dr. Pat Patrick  . Irrigation and debridement abdomen  01/10/2014    Dr. Marina Gravel    Allergies  Allergen Reactions  . Chlorpheniramine Anaphylaxis  . Linseed Oil Anaphylaxis  . Codeine Hives  . Flax Seed Oil [Bio-Flax] Swelling    UNSPECIFIED   . Latex Hives and Other (See Comments)    BLISTERS  . Tape Other (See Comments)    BLISTERS  . 2,4-D Dimethylamine (Amisol) Rash    T-DAP  . Acetaminophen Rash  . Aspirin Rash    Social History  Substance Use Topics  . Smoking status: Former Smoker    Quit date: 01/02/2014  . Smokeless tobacco: Never Used  . Alcohol Use: No    Family History  Problem Relation Age of Onset  . Diabetes Maternal Grandmother   . Diabetes Maternal Grandfather   . Diabetes Paternal Grandmother   . Diabetes Paternal Grandfather   . Hypertension Mother    Prior to Admission medications   Medication Sig Start Date End Date Taking? Authorizing Provider  citalopram (CELEXA) 20 MG tablet Take 1 tablet by mouth daily.   Yes Historical Provider, MD  cyclobenzaprine (FLEXERIL) 10 MG tablet Take 1 tablet (10 mg total) by mouth every 8 (eight) hours as needed for muscle spasms. 03/01/15  Yes Pierce Crane  Beers, PA-C  naproxen (EC NAPROSYN) 500 MG EC tablet Take 1 tablet by mouth 2 (two) times daily.   Yes Historical Provider, MD  omeprazole (PRILOSEC) 20 MG capsule Take 20 mg by mouth daily.   Yes Historical Provider, MD  oxycodone (OXY-IR) 5 MG capsule Take 1 capsule (5 mg total) by mouth every 4 (four) hours as needed. 03/01/15  Yes Pierce Crane Beers, PA-C  vitamin B-12 (CYANOCOBALAMIN) 1000 MCG tablet Take 1,000 mcg by mouth daily.   Yes Historical Provider, MD     Review of Systems  Positive ROS: As above  All other systems have been reviewed and were otherwise negative with the exception of those mentioned in the HPI and as above.  Objective: Vital signs in last 24 hours: Temp:  [97.6 F (36.4 C)] 97.6 F (36.4 C) (04/17 0907) Pulse Rate:  [68] 68 (04/17 0907) Resp:  [20] 20 (04/17 0907) BP: (142)/(63) 142/63 mmHg (04/17 0907) SpO2:  [95 %] 95 % (04/17 0907) Weight:  [124.739 kg (275 lb)] 124.739 kg (275 lb) (04/17 0907)  General Appearance: Alert, cooperative, no distress, Head: Normocephalic, without obvious abnormality, atraumatic Eyes: PERRL, conjunctiva/corneas clear, EOM's intact,    Ears: Normal  Throat: Normal  Neck: Supple, symmetrical, trachea midline, no adenopathy; thyroid: No enlargement/tenderness/nodules; no carotid bruit or JVD Back: Symmetric, no  curvature, ROM normal, no CVA tenderness Lungs: Clear to auscultation bilaterally, respirations unlabored Heart: Regular rate and rhythm, no murmur, rub or gallop Abdomen: Soft, non-tender,, no masses, no organomegaly Extremities: Extremities normal, atraumatic, no cyanosis or edema Pulses: 2+ and symmetric all extremities Skin: Skin color, texture, turgor normal, no rashes or lesions  NEUROLOGIC:   Mental status: alert and oriented, no aphasia, good attention span, Fund of knowledge/ memory ok Motor Exam - grossly normal except that the patient has weakness in her bilateral hands. Sensory Exam - grossly normal  except that she has numbness in her bilateral hands. Reflexes:  Coordination - grossly normal Gait - unsteady, she presents in a wheelchair. Balance -unsteady Cranial Nerves: I: smell Not tested  II: visual acuity  OS: Normal  OD: Normal   II: visual fields Full to confrontation  II: pupils Equal, round, reactive to light  III,VII: ptosis None  III,IV,VI: extraocular muscles  Full ROM  V: mastication Normal  V: facial light touch sensation  Normal  V,VII: corneal reflex  Present  VII: facial muscle function - upper  Normal  VII: facial muscle function - lower Normal  VIII: hearing Not tested  IX: soft palate elevation  Normal  IX,X: gag reflex Present  XI: trapezius strength  5/5  XI: sternocleidomastoid strength 5/5  XI: neck flexion strength  5/5  XII: tongue strength  Normal    Data Review Lab Results  Component Value Date   WBC 8.3 04/13/2015   HGB 13.3 04/13/2015   HCT 39.5 04/13/2015   MCV 88.8 04/13/2015   PLT 266 04/13/2015   Lab Results  Component Value Date   NA 139 04/13/2015   K 4.6 04/13/2015   CL 105 04/13/2015   CO2 23 04/13/2015   BUN 12 04/13/2015   CREATININE 0.69 04/13/2015   GLUCOSE 87 04/13/2015   No results found for: INR, PROTIME  Assessment/Plan: C3-4 and C4-5 spondylosis, stenosis, cervical myelopathy, cervicalgia: I have discussed the situation with the patient and reviewed her MRI scan with her. We have discussed the various treatment options including surgery. I have described the surgical treatment option of the C3-4 and C4-5 anterior cervical discectomy, fusion, and plating. I have shown her surgical models. We have discussed the risks, benefits, alternatives, and likelihood of achieving her goals with surgery. I have answered all the patient's questions. She has decided to proceed with surgery.   Johnjoseph Rolfe D 04/19/2015 10:20 AM

## 2015-04-19 NOTE — Anesthesia Procedure Notes (Signed)
Procedure Name: Intubation Date/Time: 04/19/2015 11:32 AM Performed by: Mariea Clonts Pre-anesthesia Checklist: Emergency Drugs available, Patient identified, Timeout performed, Suction available and Patient being monitored Patient Re-evaluated:Patient Re-evaluated prior to inductionOxygen Delivery Method: Circle system utilized Preoxygenation: Pre-oxygenation with 100% oxygen Intubation Type: IV induction Ventilation: Mask ventilation without difficulty and Oral airway inserted - appropriate to patient size Laryngoscope Size: Glidescope Tube type: Oral Tube size: 7.5 mm Number of attempts: 1 Airway Equipment and Method: Video-laryngoscopy Placement Confirmation: ETT inserted through vocal cords under direct vision,  breath sounds checked- equal and bilateral and positive ETCO2 Tube secured with: Tape Dental Injury: Teeth and Oropharynx as per pre-operative assessment

## 2015-04-20 ENCOUNTER — Encounter (HOSPITAL_COMMUNITY): Payer: Self-pay | Admitting: Neurosurgery

## 2015-04-20 DIAGNOSIS — M4712 Other spondylosis with myelopathy, cervical region: Secondary | ICD-10-CM

## 2015-04-20 DIAGNOSIS — Z6841 Body Mass Index (BMI) 40.0 and over, adult: Secondary | ICD-10-CM | POA: Diagnosis not present

## 2015-04-20 DIAGNOSIS — M1990 Unspecified osteoarthritis, unspecified site: Secondary | ICD-10-CM | POA: Insufficient documentation

## 2015-04-20 DIAGNOSIS — M50021 Cervical disc disorder at C4-C5 level with myelopathy: Secondary | ICD-10-CM | POA: Diagnosis not present

## 2015-04-20 DIAGNOSIS — I10 Essential (primary) hypertension: Secondary | ICD-10-CM | POA: Diagnosis not present

## 2015-04-20 DIAGNOSIS — M5001 Cervical disc disorder with myelopathy,  high cervical region: Secondary | ICD-10-CM | POA: Diagnosis not present

## 2015-04-20 DIAGNOSIS — G8918 Other acute postprocedural pain: Secondary | ICD-10-CM | POA: Diagnosis not present

## 2015-04-20 DIAGNOSIS — G8929 Other chronic pain: Secondary | ICD-10-CM | POA: Diagnosis not present

## 2015-04-20 DIAGNOSIS — Z87891 Personal history of nicotine dependence: Secondary | ICD-10-CM | POA: Diagnosis not present

## 2015-04-20 DIAGNOSIS — I158 Other secondary hypertension: Secondary | ICD-10-CM | POA: Insufficient documentation

## 2015-04-20 DIAGNOSIS — K5900 Constipation, unspecified: Secondary | ICD-10-CM | POA: Diagnosis not present

## 2015-04-20 NOTE — Progress Notes (Signed)
Patient ID: Selena Rodgers, female   DOB: [birth year], 65 y.o.   MRN: [id number] Subjective:  The patient is alert and pleasant. She looks well. She denies dysphagia.  Objective: Vital signs in last 24 hours: Temp:  [97 F (36.1 C)-99 F (37.2 C)] 98.6 F (37 C) (04/18 0550) Pulse Rate:  [68-94] 76 (04/18 0550) Resp:  [11-20] 16 (04/18 0550) BP: (132-166)/(63-75) 164/72 mmHg (04/18 0550) SpO2:  [93 %-98 %] 98 % (04/18 0550) Weight:  [124.739 kg (275 lb)] 124.739 kg (275 lb) (04/17 0907)  Intake/Output from previous day: 04/17 0701 - 04/18 0700 In: 1700 [I.V.:1700] Out: 2400 [Urine:2200; Blood:200] Intake/Output this shift:    Physical exam the patient is alert and pleasant. Her dressing is clean and dry. There is no evidence of hematoma or shift. The patient is moving all 4 extremities well. She has good deltoid strength.  Lab Results: No results for input(s): WBC, HGB, HCT, PLT in the last 72 hours. BMET No results for input(s): NA, K, CL, CO2, GLUCOSE, BUN, CREATININE, CALCIUM in the last 72 hours.  Studies/Results: Dg Cervical Spine 2-3 Views  04/19/2015  CLINICAL DATA:  Anterior cervical disc fusion. EXAM: CERVICAL SPINE - 2-3 VIEW COMPARISON:  None. FINDINGS: Two intraoperative lateral projections of the cervical spine were obtained. The first image demonstrates surgical localization of C4-5. The second image demonstrates the patient to be undergoing anterior cervical fusion of C3-4 and C4-5. IMPRESSION: Anterior cervical disc fusion of C3-4 and C4-5. Electronically Signed   By: Marijo Conception, M.D.   On: 04/19/2015 14:54    Assessment/Plan: Postop day #1: The plan is to have the patient work with PT and OT and determine whether she should go into rehabilitation versus home/outpatient therapies. I have answered all the patient's, and her sisters, questions.      Stormy Connon D 04/20/2015, 7:43 AM

## 2015-04-20 NOTE — Progress Notes (Deleted)
Physical medicine rehabilitation consult requested chart review. Patient currently under observation status after anterior cervical discectomy and decompression 04/19/2015. Await physical and occupational therapy evaluations to be completed and follow-up with appropriate recommendations at that time.

## 2015-04-20 NOTE — Care Management Note (Signed)
Case Management Note  Patient Details  Name: Selena Rodgers MRN: [id number] Date of Birth: [birth year]  Subjective/Objective:                    Action/Plan: Patient presented for a ANTERIOR CERVICAL DECOMPRESSION/DISCECTOMY FUSION INTERBODY PROTHESIS PLATING BONEGRAFT CERVICAL THREE-FOUR ,CERVICAL FOUR-FIVE.  Lives at home alone, but sister has been staying w/ her at night, Aunt and mom check on her during the day. Per PT, patient has the following DME at home:  [employer] Rile - 2 wheels;Bedside commode;Tub bench;Wheelchair - power;Walker -standard;  Crutches;  Adaptive equipment (lift chair; rails on bed; rollator?).  CM will follow for discharge needs pending PT/OT evals and physician orders.  Expected Discharge Date:                  Expected Discharge Plan:     In-House Referral:     Discharge planning Services     Post Acute Care Choice:    Choice offered to:     DME Arranged:    DME Agency:     HH Arranged:    HH Agency:     Status of Service:  In process, will continue to follow  Medicare Important Message Given:    Date Medicare IM Given:    Medicare IM give by:    Date Additional Medicare IM Given:    Additional Medicare Important Message give by:     If discussed at [place] of Stay Meetings, dates discussed:    Additional Comments:  Rolm Baptise, RN 04/20/2015, 10:44 AM [phone]

## 2015-04-20 NOTE — Progress Notes (Signed)
Inpatient Rehabilitation  I met with the patient and her sister at the bedside to discuss the recommendation for IP Rehab.  Pt. tearful , stating "I am just tired.  You don't know the hell I have been through".  Pt. also relays that her recliner chair is terribly uncomfortable.  I discussed seating  options with pt. and with her RN Latoya.  Phineas Real is to obtain straight back chair for pt. To sit in at dinner.  After lengthy discussion , pt. Is in agreement that she needs IP Rehab.  Sister is highly in favor of CIR.  I will initiate insurance authorization process for potential admission when medically ready.  Please call if questions.  [facility] Admissions Coordinator Cell [phone] Office [phone]

## 2015-04-20 NOTE — Evaluation (Signed)
Physical Therapy Evaluation Patient Details Name: Selena Rodgers MRN: [id number] DOB: Feb 28, 1950 Today's Date: 04/20/2015   History of Present Illness  Pt is a 65 y.o. F who presented w/ quadraparesis whose MRI demonstrated severe spinal stenosis at C3-4 and C4-5. Pt is now s/p C3-4 and C4-5 anterior cervical discectomy/decompression fusion. Pt's PMH includes Rt ankle surgery, carpal tunnel release, spinal stenosis, chronic back pain, arthritis, umbilical hernia, ventral hernia.    Clinical Impression  Patient is s/p above surgery resulting in functional limitations due to the deficits listed below (see PT Problem List). Selena Rodgers presents w/ impaired sensation, strength, balance, and mobility.  PTA she needed assist w/ ADLs and was ambulating w/ RW.  She is very motivated to regain her independence and would be an excellent candidate for CIR to reach mod I level of mobility. She currently requires mod +2 assist for safe sit<>stand transfer and ambulated a short distance in her room w/ +2 min guard assist using RW.  Patient will benefit from skilled PT to increase their independence and safety with mobility to allow discharge to the venue listed below.      Follow Up Recommendations CIR;Supervision for mobility/OOB    Equipment Recommendations  None recommended by PT    Recommendations for Other Services Rehab consult     Precautions / Restrictions Precautions Precautions: Fall;Cervical Precaution Comments: talked with pt about keeping neck straight Required Braces or Orthoses: Cervical Brace Cervical Brace: Hard collar (per pt, okay to take off when sitting and sleeping?note left) Restrictions Weight Bearing Restrictions: No      Mobility  Bed Mobility               General bed mobility comments: Pt sitting in recliner chair upon PT arrival  Transfers Overall transfer level: Needs assistance Equipment used: Rolling walker (2 wheeled) Transfers: Sit to/from Stand Sit to  Stand: Mod assist;+2 physical assistance         General transfer comment: Use of bed pad and gait belt in place to assist w/ boost up to standing.  Cues for hand placement. Pt is slow to stand and requires assist to keep RW on floor as pt pulling on it to straighten up.  Assist to control descent to sit in chair.  Ambulation/Gait Ambulation/Gait assistance: Min guard;+2 safety/equipment Ambulation Distance (Feet): 15 Feet Assistive device: Rolling walker (2 wheeled) Gait Pattern/deviations: Decreased stride length;Trunk flexed   Gait velocity interpretation: <1.8 ft/sec, indicative of risk for recurrent falls General Gait Details: Pt demonstrates flexed posture and close chair follow for safety.  Close min guard due to pt's instability.  Pt becomes tearful at mention of additional rehab and requests to sit.  Stairs            Wheelchair Mobility    Modified Rankin (Stroke Patients Only)       Balance Overall balance assessment: Needs assistance Sitting-balance support: Bilateral upper extremity supported;Feet supported Sitting balance-Leahy Scale: Poor Sitting balance - Comments: Relies on UE support due to posterior lean Postural control: Posterior lean Standing balance support: Bilateral upper extremity supported;During functional activity Standing balance-Leahy Scale: Poor Standing balance comment: Relies on Bil UE support                             Pertinent Vitals/Pain Pain Assessment: 0-10 Pain Score: 4  Pain Location: back and Rt shoulder  Pain Descriptors / Indicators: Other (Comment) (aggravating) Pain Intervention(s): Monitored during session  Home Living Family/patient expects to be discharged to:: Private residence Living Arrangements: Alone Available Help at Discharge: Family;Available PRN/intermittently Type of Home: House Home Access: Ramped entrance       Home Equipment: Walker - 2 wheels;Bedside commode;Tub bench;Wheelchair -  power;Walker - standard;Crutches;Adaptive equipment (lift chair; rails on bed; rollator?) Additional Comments: sister has been staying w/ her at night, Aunt and mom check on her during the day    Prior Function Level of Independence: Needs assistance   Gait / Transfers Assistance Needed: Over the past few weeks has had multiple episodes where she could not stand (after car rides).  Otherwise pt was using RW. family has been assisting pt with transfers.  ADL's / Homemaking Assistance Needed: Sister assisted with bathing, dressing recently.  Has someone who cleans every two weeks.  Does her own cooking.  Comments: PTA pt was still working 40 hrs/wk until the past 2 wks     Hand Dominance   Dominant Hand: Right    Extremity/Trunk Assessment   Upper Extremity Assessment: RUE deficits/detail;LUE deficits/detail           Lower Extremity Assessment: RLE deficits/detail;LLE deficits/detail RLE Deficits / Details: strength grossly 5/5 w/ MMT; limited inversion and eversion of Rt ankle due to h/o ankle surgery LLE Deficits / Details: strength grossly 5/5 w/ MMT  Cervical / Trunk Assessment: Other exceptions  Communication   Communication: No difficulties  Cognition Arousal/Alertness: Awake/alert Behavior During Therapy: WFL for tasks assessed/performed (became tearful) Overall Cognitive Status: Within Functional Limits for tasks assessed                      General Comments General comments (skin integrity, edema, etc.): Pt becomes tearful w/ mention of CIR; although pt agreeable to consider this option at end of session.  Pt's most likely derives from her desire to return to her home environment after everything she has been through.    Exercises        Assessment/Plan    PT Assessment Patient needs continued PT services  PT Diagnosis Difficulty walking;Acute pain   PT Problem List Decreased strength;Decreased activity tolerance;Decreased balance;Decreased  mobility;Decreased range of motion;Decreased knowledge of use of DME;Decreased safety awareness;Decreased knowledge of precautions;Impaired sensation;Pain  PT Treatment Interventions DME instruction;Gait training;Functional mobility training;Therapeutic activities;Therapeutic exercise;Balance training;Neuromuscular re-education;Patient/family education;Modalities   PT Goals (Current goals can be found in the Care Plan section) Acute Rehab PT Goals Patient Stated Goal: to regain her independence and to go home PT Goal Formulation: With patient/family Time For Goal Achievement: 05/04/15 Potential to Achieve Goals: Good    Frequency Min 5X/week   Barriers to discharge Decreased caregiver support Intermittent assist during the day     Co-evaluation PT/OT/SLP Co-Evaluation/Treatment: Yes Reason for Co-Treatment: For patient/therapist safety PT goals addressed during session: Mobility/safety with mobility;Balance;Proper use of DME;Strengthening/ROM OT goals addressed during session: ADL's and self-care;Other (comment) (mobility)       End of Session Equipment Utilized During Treatment: Gait belt;Cervical collar Activity Tolerance: Patient tolerated treatment well;Patient limited by fatigue Patient left: in chair;with call bell/phone within reach;with chair alarm set;with family/visitor present Nurse Communication: Mobility status;Precautions    Functional Assessment Tool Used: Clinical Judgement Functional Limitation: Mobility: Walking and moving around Mobility: Walking and Moving Around Current Status [id number]): At least 40 percent but less than 60 percent impaired, limited or restricted Mobility: Walking and Moving Around Goal Status [phone]): At least 1 percent but less than 20 percent impaired, limited or restricted  Time: [id number] PT Time Calculation (min) (ACUTE ONLY): 30 min   Charges:   PT Evaluation $PT Eval Moderate Complexity: 1 Procedure     PT G Codes:   PT G-Codes  **NOT FOR INPATIENT CLASS** Functional Assessment Tool Used: Clinical Judgement Functional Limitation: Mobility: Walking and moving around Mobility: Walking and Moving Around Current Status [id number]): At least 40 percent but less than 60 percent impaired, limited or restricted Mobility: Walking and Moving Around Goal Status [phone]): At least 1 percent but less than 20 percent impaired, limited or restricted    Collie Siad PT, DPT  Pager: [phone] Phone: [phone] 04/20/2015, 10:15 AM

## 2015-04-20 NOTE — Consult Note (Signed)
Physical Medicine and Rehabilitation Consult Reason for Consult: Cervical myelopathy Referring Physician: [name]   HPI: Selena Rodgers is a 65 y.o. right handed female with progressive numbness and weakness in hands, quadriparesis and difficulty with ambulation. Patient lives alone in [place] works at [employer] as a Research scientist (physical sciences). She was using a walker prior to admission. One level home with a ramp. Presented 04/19/2015 after recent MRI demonstrated severe spinal stenosis at C3-4 and C4-5. Underwent C3-4 and C4-5 anterior cervical discectomy decompression, C3-4 and C4-5 interbody arthrodesis 04/19/2015 per [name]. Hospital course pain management. Decadron protocol initiated. Aspen cervical collar applied. Physical and occupational therapy evaluations pending. M.D. has requested physical medicine rehabilitation consult.   Review of Systems  Constitutional: Negative for fever and chills.  HENT: Negative for hearing loss.   Eyes: Negative for blurred vision and double vision.  Respiratory: Negative for cough and shortness of breath.   Cardiovascular: Negative for chest pain, palpitations and leg swelling.  Gastrointestinal: Positive for constipation. Negative for nausea and vomiting.  Genitourinary: Negative for dysuria and hematuria.  Musculoskeletal: Positive for myalgias and neck pain.  Skin: Negative for rash.  Neurological: Positive for sensory change, focal weakness and weakness. Negative for headaches.  Psychiatric/Behavioral: Positive for depression.  All other systems reviewed and are negative.  Past Medical History  Diagnosis Date  . Spinal stenosis   . Chronic back pain   . Arthritis   . Umbilical hernia   . Ventral hernia    Past Surgical History  Procedure Laterality Date  . Cholecystectomy    . Breast biopsy    . Foot surgery Right   . Carpal tunnel release    . Umbilical hernia repair    . Ventral hernia  repair  12/26/2013    Dr. Pat Patrick  . Irrigation and debridement abdomen  01/10/2014    Dr. Marina Gravel   Family History  Problem Relation Age of Onset  . Diabetes Maternal Grandmother   . Diabetes Maternal Grandfather   . Diabetes Paternal Grandmother   . Diabetes Paternal Grandfather   . Hypertension Mother    Social History:  reports that she quit smoking about 15 months ago. She has never used smokeless tobacco. She reports that she does not drink alcohol or use illicit drugs. Allergies:  Allergies  Allergen Reactions  . Chlorpheniramine Anaphylaxis  . Linseed Oil Anaphylaxis  . Codeine Hives  . Flax Seed Oil [Bio-Flax] Swelling    UNSPECIFIED   . Latex Hives and Other (See Comments)    BLISTERS  . Tape Other (See Comments)    BLISTERS  . 2,4-D Dimethylamine (Amisol) Rash    T-DAP  . Acetaminophen Rash  . Aspirin Rash   Medications Prior to Admission  Medication Sig Dispense Refill  . citalopram (CELEXA) 20 MG tablet Take 1 tablet by mouth daily.    . cyclobenzaprine (FLEXERIL) 10 MG tablet Take 1 tablet (10 mg total) by mouth every 8 (eight) hours as needed for muscle spasms. 30 tablet 0  . naproxen (EC NAPROSYN) 500 MG EC tablet Take 1 tablet by mouth 2 (two) times daily.    Marland Kitchen omeprazole (PRILOSEC) 20 MG capsule Take 20 mg by mouth daily.    Marland Kitchen oxycodone (OXY-IR) 5 MG capsule Take 1 capsule (5 mg total) by mouth every 4 (four) hours as needed. 8 capsule 0  . vitamin B-12 (CYANOCOBALAMIN) 1000 MCG tablet Take 1,000 mcg by mouth daily.  Home:    Functional History:   Functional Status:  Mobility:          ADL:    Cognition: Cognition Orientation Level: Oriented X4    Blood pressure 164/72, pulse 76, temperature 98.6 F (37 C), temperature source Oral, resp. rate 16, height 5\' 7"  (1.702 m), weight 124.739 kg (275 lb), SpO2 98 %. Physical Exam  Vitals reviewed. Constitutional: She is oriented to person, place, and time. She appears well-developed.  65 year old  obese female sitting up in chair.  HENT:  Head: Normocephalic and atraumatic.  Eyes: Conjunctivae and EOM are normal.  Neck:  Aspen cervical collar in place  Cardiovascular: Normal rate and regular rhythm.   Respiratory: Effort normal and breath sounds normal. No respiratory distress.  GI: Soft. Bowel sounds are normal. She exhibits no distension.  + Ventral hernia  Musculoskeletal: She exhibits edema and tenderness.  Neurological: She is alert and oriented to person, place, and time.  Sensation diminished to light touch in bilateral hands DTRs 3+ bilateral lower extremities Motor: Bilateral upper extremities: Shoulder abduction 3+/5, distally 4+/5 Bilateral lower extremities: Hip flexion: 4 -/5, knee extension 4+/5, ankle dorsi/plantarflexion 5/5  Skin: Skin is warm and dry.  Psychiatric: Her speech is normal. Judgment normal. Her affect is labile. She is withdrawn. Cognition and memory are normal. She exhibits a depressed mood.    No results found for this or any previous visit (from the past 24 hour(s)). Dg Cervical Spine 2-3 Views  04/19/2015  CLINICAL DATA:  Anterior cervical disc fusion. EXAM: CERVICAL SPINE - 2-3 VIEW COMPARISON:  None. FINDINGS: Two intraoperative lateral projections of the cervical spine were obtained. The first image demonstrates surgical localization of C4-5. The second image demonstrates the patient to be undergoing anterior cervical fusion of C3-4 and C4-5. IMPRESSION: Anterior cervical disc fusion of C3-4 and C4-5. Electronically Signed   By: Marijo Conception, M.D.   On: 04/19/2015 14:54    Assessment/Plan: Diagnosis: Cervical myelopathy Labs and images independently reviewed.  Records reviewed and summated above.  1. Does the need for close, 24 hr/day medical supervision in concert with the patient's rehab needs make it unreasonable for this patient to be served in a less intensive setting? Potentially  2. Co-Morbidities requiring supervision/potential  complications:  pain management (Biofeedback training with therapies to help reduce reliance on opiate pain medications, monitor pain control during therapies, and sedation at rest and titrate to maximum efficacy to ensure participation and gains in therapies), right shoulder arthritis (Biofeedback training with therapies to help reduce reliance on opiate pain medications, monitor pain control during therapies, and sedation at rest and titrate to maximum efficacy to ensure participation and gains in therapies), HTN (monitor and provide prns in accordance with increased physical exertion and pain) 3. Due to safety, skin/wound care, disease management, pain management and patient education, does the patient require 24 hr/day rehab nursing? Potentially 4. Does the patient require coordinated care of a physician, rehab nurse, PT (1-2 hrs/day, 5 days/week) and OT (1-2 hrs/day, 5 days/week) to address physical and functional deficits in the context of the above medical diagnosis(es)? Potentially Addressing deficits in the following areas: TBD 5. Can the patient actively participate in an intensive therapy program of at least 3 hrs of therapy per day at least 5 days per week? Potentially 6. The potential for patient to make measurable gains while on inpatient rehab is TBD 7. Anticipated functional outcomes upon discharge from inpatient rehab are TBD  with PT, TBD with  OT, TBD with SLP. 8. Estimated rehab length of stay to reach the above functional goals is: TBD 9. Does the patient have adequate social supports and living environment to accommodate these discharge functional goals? Potentially 10. Anticipated D/C setting: Home 11. Anticipated post D/C treatments: HH therapy and Home excercise program 12. Overall Rehab/Functional Prognosis: excellent  RECOMMENDATIONS: This patient's condition is appropriate for continued rehabilitative care in the following setting: Patient is unsure whether she would like to  go to CIR. At this time. She has a strong desire to go home. Further, patient has not been evaluated by therapies. Will await formal therapy consults. If patient does decide to go home, would recommend follow-up with PM&R as outpatient. Patient has agreed to participate in recommended program. Potentially Note that insurance prior authorization may be required for reimbursement for recommended care.  Comment: Rehab Admissions Coordinator to follow up.  Delice Lesch, MD 04/20/2015

## 2015-04-20 NOTE — H&P (Signed)
Physical Medicine and Rehabilitation Admission H&P    Chief complaint: Neck pain  HPI: Selena Rodgers is a 65 y.o. right handed female with progressive numbness and weakness in hands, quadriparesis and difficulty with ambulation. Patient lives alone in [place] works at [employer] as a Research scientist (physical sciences). She was using a walker prior to admission. One level home with a ramp. Her sister has been staying with her at night. Presented 04/19/2015 after recent MRI demonstrated severe spinal stenosis at C3-4 and C4-5. Underwent C3-4 and C4-5 anterior cervical discectomy decompression, C3-4 and C4-5 interbody arthrodesis 04/19/2015 per [name]. Hospital course pain management. Decadron protocol initiated. Aspen cervical collar applied. Physical and occupational therapy evaluations completed with recommendations of physical medicine rehabilitation consult. M.D. has requested physical medicine rehabilitation consult. Patient was admitted for a comprehensive rehabilitation program  ROS Constitutional: Negative for fever and chills.  HENT: Negative for hearing loss.  Eyes: Negative for blurred vision and double vision.  Respiratory: Negative for cough and shortness of breath.  Cardiovascular: Negative for chest pain, palpitations and leg swelling.  Gastrointestinal: Positive for constipation. Negative for nausea and vomiting.  Genitourinary: Negative for dysuria and hematuria.  Musculoskeletal: Positive for myalgias and neck pain.  Skin: Negative for rash.  Neurological: Positive for sensory change, focal weakness and weakness. Negative for headaches.  Psychiatric/Behavioral: Positive for depression.  All other systems reviewed and are negative   Past Medical History  Diagnosis Date  . Spinal stenosis   . Chronic back pain   . Arthritis   . Umbilical hernia   . Ventral hernia    Past Surgical History  Procedure Laterality Date  . Cholecystectomy      . Breast biopsy    . Foot surgery Right   . Carpal tunnel release    . Umbilical hernia repair    . Ventral hernia repair  12/26/2013    Dr. Pat Patrick  . Irrigation and debridement abdomen  01/10/2014    Dr. Marina Gravel   Family History  Problem Relation Age of Onset  . Diabetes Maternal Grandmother   . Diabetes Maternal Grandfather   . Diabetes Paternal Grandmother   . Diabetes Paternal Grandfather   . Hypertension Mother    Social History:  reports that she quit smoking about 15 months ago. She has never used smokeless tobacco. She reports that she does not drink alcohol or use illicit drugs. Allergies:  Allergies  Allergen Reactions  . Chlorpheniramine Anaphylaxis  . Linseed Oil Anaphylaxis  . Codeine Hives  . Flax Seed Oil [Bio-Flax] Swelling    UNSPECIFIED   . Latex Hives and Other (See Comments)    BLISTERS  . Tape Other (See Comments)    BLISTERS  . 2,4-D Dimethylamine (Amisol) Rash    T-DAP  . Acetaminophen Rash  . Aspirin Rash   Medications Prior to Admission  Medication Sig Dispense Refill  . citalopram (CELEXA) 20 MG tablet Take 1 tablet by mouth daily.    . cyclobenzaprine (FLEXERIL) 10 MG tablet Take 1 tablet (10 mg total) by mouth every 8 (eight) hours as needed for muscle spasms. 30 tablet 0  . naproxen (EC NAPROSYN) 500 MG EC tablet Take 1 tablet by mouth 2 (two) times daily.    Marland Kitchen omeprazole (PRILOSEC) 20 MG capsule Take 20 mg by mouth daily.    Marland Kitchen oxycodone (OXY-IR) 5 MG capsule Take 1 capsule (5 mg total) by mouth every 4 (four) hours as needed. 8 capsule 0  .  vitamin B-12 (CYANOCOBALAMIN) 1000 MCG tablet Take 1,000 mcg by mouth daily.      Home: Home Living Family/patient expects to be discharged to:: Private residence Living Arrangements: Alone Available Help at Discharge: Family, Available PRN/intermittently Type of Home: House Home Access: Ramped entrance [place] Shower/Tub: Tub/shower unit [place]: Environmental consultant - 2 wheels, Bedside commode, Tub bench,  Wheelchair - power, Environmental consultant - standard, Crutches, Adaptive equipment (lift chair; rails on bed; rollator?) Adaptive Equipment: Reacher (long handled sponge/brush?) Additional Comments: sister has been staying w/ her at night, Aunt and mom check on her during the day   Functional History: Prior Function Level of Independence: Needs assistance Gait / Transfers Assistance Needed: Over the past few weeks has had multiple episodes where she could not stand (after car rides).  Otherwise pt was using RW. family has been assisting pt with transfers. ADL's / Homemaking Assistance Needed: Sister assisted with bathing, dressing recently.  Has someone who cleans every two weeks.  Does her own cooking. Comments: PTA pt was still working 40 hrs/wk until the past 2 wks  Functional Status:  Mobility: Bed Mobility General bed mobility comments: not assessed Transfers Overall transfer level: Needs assistance Equipment used: Rolling walker (2 wheeled) Transfers: Sit to/from Stand Sit to Stand: Mod assist, +2 physical assistance General transfer comment: Use of bed pad and gait belt in place to assist with boost up to standing.  Cues for hand placement. Pt is slow to stand and requires assist to keep RW on floor as pt pulling on it to straighten up.  Assist to control descent to sit in chair. Ambulation/Gait Ambulation/Gait assistance: Min guard, +2 safety/equipment Ambulation Distance (Feet): 15 Feet Assistive device: Rolling walker (2 wheeled) Gait Pattern/deviations: Decreased stride length, Trunk flexed General Gait Details: Pt demonstrates flexed posture and close chair follow for safety.  Close min guard due to pt's instability.  Pt becomes tearful at mention of additional rehab and requests to sit. Gait velocity interpretation: <1.8 ft/sec, indicative of risk for recurrent falls    ADL: ADL Overall ADL's : Needs assistance/impaired Grooming: Minimal assistance, Sitting Grooming Details (indicate  cue type and reason): wiped face with paper towel from crying with no difficulty Lower Body Dressing: Maximal assistance, Sit to/from stand (with +2 Mod assist for sit to stand transfer) Toilet Transfer: Moderate assistance, +2 for physical assistance, +2 for safety/equipment, RW, Ambulation (sit to stand from chair) Toilet Transfer Details (indicate cue type and reason): +2 Mod assist-sit to stand; +2 (for safety)Min guard for ambulation Functional mobility during ADLs: +2 for physical assistance, Moderate assistance, Rolling walker, +2 for safety/equipment (+2 Mod assist-sit to stand; +2 Min guard-ambulation) General ADL Comments: Donned pt's cervical collar-not on when OT went in room. Talked about AE briefly.   Cognition: Cognition Overall Cognitive Status: Within Functional Limits for tasks assessed Orientation Level: Oriented X4 Cognition Arousal/Alertness: Awake/alert Behavior During Therapy: WFL for tasks assessed/performed (became tearful) Overall Cognitive Status: Within Functional Limits for tasks assessed  Physical Exam: Blood pressure 153/65, pulse 90, temperature 99 F (37.2 C), temperature source Oral, resp. rate 18, height 5\' 7"  (1.702 m), weight 124.739 kg (275 lb), SpO2 93 %. Physical Exam Constitutional: She is oriented to person, place, and time. She appears well-developed. Obese.  HENT:  Head: Normocephalic and atraumatic.  Eyes: Conjunctivae and EOM are normal.  Neck:  Aspen cervical collar in place  Cardiovascular: Normal rate and regular rhythm.  Respiratory: Effort normal and breath sounds normal. No respiratory distress.  GI: Soft. Bowel  sounds are normal. She exhibits no distension.  + Ventral hernia  Musculoskeletal: She exhibits edema and tenderness.  Neurological: She is alert and oriented to person, place, and time.  Sensation diminished to light touch in bilateral hands DTRs 3+ bilateral lower extremities Motor: Bilateral upper extremities: Shoulder  abduction 4/5 (RUE somewhat limited by pain), distally 4+/5 Bilateral lower extremities: Hip flexion: 4 -/5, knee extension 4+/5, ankle dorsi/plantarflexion 5/5  Skin: Skin is warm and dry.  Psychiatric: Her speech is normal. Judgment normal. Her affect is labile. She is withdrawn. Cognition and memory are normal. She exhibits a depressed mood (improving).  No results found for this or any previous visit (from the past 48 hour(s)). Dg Cervical Spine 2-3 Views  04/19/2015  CLINICAL DATA:  Anterior cervical disc fusion. EXAM: CERVICAL SPINE - 2-3 VIEW COMPARISON:  None. FINDINGS: Two intraoperative lateral projections of the cervical spine were obtained. The first image demonstrates surgical localization of C4-5. The second image demonstrates the patient to be undergoing anterior cervical fusion of C3-4 and C4-5. IMPRESSION: Anterior cervical disc fusion of C3-4 and C4-5. Electronically Signed   By: Marijo Conception, M.D.   On: 04/19/2015 14:54   Medical Problem List and Plan: 1.  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 Patient with Aspen cervical collar 2.  DVT Prophylaxis/Anticoagulation: SCDs. Monitor for any signs of DVT 3. Pain Management: Oxycodone and Robaxin as needed. Monitor with increased mobility 4. Mood: Celexa 20 mg daily 5. Neuropsych: This patient is capable of making decisions on her own behalf. 6. Skin/Wound Care: Routine skin checks 7. Fluids/Electrolytes/Nutrition: Routine I&O with follow-up chemistries 8. Constipation. Laxative assistance 9. 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 10. HTN: Cont to monitor with increased activity.  Treat if warranted.   Post Admission Physician Evaluation: 1. Functional deficits secondary  to  severe cervical stenosis C3-4 and C4-5 status post anterior cervical discectomy decompression. 2. Patient is admitted  to receive collaborative, interdisciplinary care between the physiatrist, rehab nursing staff, and therapy team. 3. Patient's level of medical complexity and substantial therapy needs in context of that medical necessity cannot be provided at a lesser intensity of care such as a SNF. 4. Patient has experienced substantial functional loss from his/her baseline which was documented above under the "Functional History" and "Functional Status" headings.  Judging by the patient's diagnosis, physical exam, and functional history, the patient has potential for functional progress which will result in measurable gains while on inpatient rehab.  These gains will be of substantial and practical use upon discharge  in facilitating mobility and self-care at the household level. 5. Physiatrist will provide 24 hour management of medical needs as well as oversight of the therapy plan/treatment and provide guidance as appropriate regarding the interaction of the two. 6. 24 hour rehab nursing will assist with safety, skin/wound care, disease management, pain management and patient education and help integrate therapy concepts, techniques,education, etc. 7. PT will assess and treat for/with: Lower extremity strength, range of motion, stamina, balance, functional mobility, safety, adaptive techniques and equipment, woundcare, coping skills, pain control, education.   Goals are: Supervision/Mod I. 8. OT will assess and treat for/with: ADL's, functional mobility, safety, upper extremity strength, adaptive techniques and equipment, wound mgt, ego support, and community reintegration.   Goals are: Supervision/Mod I. Therapy may not proceed with showering this patient. 9. Case Management and Social Worker will assess and treat for psychological issues  and discharge planning. 10. Team conference will be held weekly to assess progress toward goals and to determine barriers to discharge. 11. Patient will receive at least 3 hours of  therapy per day at least 5 days per week. 12. ELOS: 13-17 days.        13. Prognosis:  good  Delice Lesch, MD 04/20/2015

## 2015-04-20 NOTE — Evaluation (Addendum)
Occupational Therapy Evaluation Patient Details Name: Selena Rodgers MRN: [id number] DOB: [birth year] Today's Date: 04/20/2015    History of Present Illness Pt is a 65 y.o. F who presented w/ quadraparesis whose MRI demonstrated severe spinal stenosis at C3-4 and C4-5. Pt is now s/p C3-4 and C4-5 anterior cervical discectomy/decompression fusion. Pt's PMH includes Rt ankle surgery, carpal tunnel release, spinal stenosis, chronic back pain, arthritis, umbilical hernia, ventral hernia.   Clinical Impression   Pt requiring assist for ADLs recently, PTA. Feel pt will benefit from acute OT to increase independence prior to d/c. Recommending CIR for rehab. Pt required +2 Mod assist to stand and ambulated in room with +2 assist for safety (Min guard) and requires assist for ADLs at this time.     Follow Up Recommendations  CIR    Equipment Recommendations  Other (comment) (defer to next venue)    Recommendations for Other Services       Precautions / Restrictions Precautions Precautions: Fall;Cervical Precaution Comments: talked with pt about keeping neck straight Required Braces or Orthoses: Cervical Brace Cervical Brace: Hard collar (per pt, okay to take off when sitting and sleeping?note left) Restrictions Weight Bearing Restrictions: No      Mobility Bed Mobility               General bed mobility comments: not assessed  Transfers Overall transfer level: Needs assistance Equipment used: Rolling walker (2 wheeled) Transfers: Sit to/from Stand Sit to Stand: Mod assist;+2 physical assistance         General transfer comment: Use of bed pad and gait belt in place to assist with boost up to standing.  Cues for hand placement. Pt is slow to stand and requires assist to keep RW on floor as pt pulling on it to straighten up.  Assist to control descent to sit in chair.    Balance Overall balance assessment: Needs assistance Sitting-balance support: Bilateral upper  extremity supported;Feet supported Sitting balance-Leahy Scale: Poor Sitting balance - Comments: Relies on UE support due to posterior lean Postural control: Posterior lean Standing balance support: Bilateral upper extremity supported;During functional activity Standing balance-Leahy Scale: Poor Standing balance comment: Relies on Bil UE support                            ADL Overall ADL's : Needs assistance/impaired     Grooming: Minimal assistance;Sitting Grooming Details (indicate cue type and reason): wiped face with paper towel from crying with no difficulty             Lower Body Dressing: Maximal assistance;Sit to/from stand (with +2 Mod assist for sit to stand transfer)   Toilet Transfer: Moderate assistance;+2 for physical assistance;+2 for safety/equipment;RW;Ambulation (sit to stand from chair) Toilet Transfer Details (indicate cue type and reason): +2 Mod assist-sit to stand; +2 (for safety)Min guard for ambulation         Functional mobility during ADLs: +2 for physical assistance;Moderate assistance;Rolling walker;+2 for safety/equipment (+2 Mod assist-sit to stand; +2 Min guard-ambulation) General ADL Comments: Donned pt's cervical collar-not on when OT went in room. Talked about AE briefly.      Vision     Perception     Praxis      Pertinent Vitals/Pain Pain Assessment: 0-10 Pain Score: 4  Pain Location: back and Rt shoulder  Pain Descriptors / Indicators: Other (Comment) (aggravating) Pain Intervention(s): Monitored during session     Hand Dominance Right   Extremity/Trunk  Assessment Upper Extremity Assessment; decreased sensation in bilateral UEs Upper Extremity Assessment: RUE deficits/detail;LUE deficits/detail RUE Deficits / Details: less than 90 degrees AROM shoulder flexion; pain in right shoulder RUE Coordination: decreased fine motor LUE Coordination: decreased fine motor   Lower Extremity Assessment Lower Extremity  Assessment: Defer to PT evaluation RLE Deficits / Details: strength grossly 5/5 w/ MMT; limited inversion and eversion of Rt ankle due to h/o ankle surgery RLE Sensation: decreased light touch LLE Deficits / Details: strength grossly 5/5 w/ MMT LLE Sensation: decreased light touch   Cervical / Trunk Assessment Cervical / Trunk Assessment: Other exceptions Cervical / Trunk Exceptions: s/p cervical fusion   Communication Communication Communication: No difficulties   Cognition Arousal/Alertness: Awake/alert Behavior During Therapy: WFL for tasks assessed/performed (became tearful) Overall Cognitive Status: Within Functional Limits for tasks assessed                     General Comments       Exercises       Shoulder Instructions      Home Living Family/patient expects to be discharged to:: Private residence Living Arrangements: Alone Available Help at Discharge: Family;Available PRN/intermittently Type of Home: House Home Access: Ramped entrance           Bathroom Shower/Tub: Tub/shower unit         Home Equipment: Environmental consultant - 2 wheels;Bedside commode;Tub bench;Wheelchair - power;Walker - standard;Crutches;Adaptive equipment (lift chair; rails on bed; rollator?) Adaptive Equipment: Reacher (long handled sponge/brush?) Additional Comments: sister has been staying w/ her at night, Aunt and mom check on her during the day      Prior Functioning/Environment Level of Independence: Needs assistance  Gait / Transfers Assistance Needed: Over the past few weeks has had multiple episodes where she could not stand (after car rides).  Otherwise pt was using RW. family has been assisting pt with transfers. ADL's / Homemaking Assistance Needed: Sister assisted with bathing, dressing recently.  Has someone who cleans every two weeks.  Does her own cooking.   Comments: PTA pt was still working 40 hrs/wk until the past 2 wks    OT Diagnosis: Acute pain   OT Problem List:  Decreased coordination;Pain;Obesity;Decreased knowledge of use of DME or AE;Decreased knowledge of precautions;Impaired balance (sitting and/or standing);Decreased activity tolerance;Decreased range of motion; decreased sensation   OT Treatment/Interventions: Self-care/ADL training;DME and/or AE instruction;Therapeutic activities;Patient/family education;Balance training;Therapeutic exercise    OT Goals(Current goals can be found in the care plan section) Acute Rehab OT Goals Patient Stated Goal: go home OT Goal Formulation: With patient/family Time For Goal Achievement: 04/27/15 Potential to Achieve Goals: Good ADL Goals Pt Will Perform Grooming: with set-up;with supervision;standing (3 tasks) Pt Will Perform Lower Body Dressing: with adaptive equipment;sit to/from stand;with caregiver independent in assisting;with min assist Pt Will Transfer to Toilet: ambulating;bedside commode;with min assist (including sit to stand transfer) Pt Will Perform Toileting - Clothing Manipulation and hygiene: sit to/from stand;with set-up;with supervision (with or without AE)  OT Frequency: Min 2X/week   Barriers to D/C:            Co-evaluation PT/OT/SLP Co-Evaluation/Treatment: Yes Reason for Co-Treatment: For patient/therapist safety PT goals addressed during session: Mobility/safety with mobility;Balance;Proper use of DME;Strengthening/ROM OT goals addressed during session: ADL's and self-care;Other (comment) (mobility)      End of Session Equipment Utilized During Treatment: Gait belt;Cervical collar;Rolling walker;Other (comment) (AE)  Activity Tolerance: Patient tolerated treatment well (became tearful in session) Patient left: with chair alarm set;with family/visitor present;Other (  comment);in chair;with call bell/phone within reach (MD in room)   Time: [id number] OT Time Calculation (min): 27 min Charges:  OT General Charges $OT Visit: 1 Procedure OT Evaluation $OT Eval Moderate  Complexity: 1 Procedure G-Codes: OT G-codes **NOT FOR INPATIENT CLASS** Functional Assessment Tool Used: clinical judgment Functional Limitation: Self care Self Care Current Status [id number]): At least 40 percent but less than 60 percent impaired, limited or restricted Self Care Goal Status [id number]): At least 1 percent but less than 20 percent impaired, limited or restricted  Benito Mccreedy OTR/L [id number] 04/20/2015, 10:25 AM

## 2015-04-21 ENCOUNTER — Inpatient Hospital Stay (HOSPITAL_COMMUNITY)
Admission: RE | Admit: 2015-04-21 | Discharge: 2015-04-28 | DRG: 551 | Disposition: A | Discharge status: Home Health | Payer: 59 | Source: Intra-hospital | Attending: Physical Medicine & Rehabilitation | Admitting: Physical Medicine & Rehabilitation

## 2015-04-21 DIAGNOSIS — M19011 Primary osteoarthritis, right shoulder: Secondary | ICD-10-CM | POA: Insufficient documentation

## 2015-04-21 DIAGNOSIS — K5901 Slow transit constipation: Secondary | ICD-10-CM | POA: Insufficient documentation

## 2015-04-21 DIAGNOSIS — Z885 Allergy status to narcotic agent status: Secondary | ICD-10-CM

## 2015-04-21 DIAGNOSIS — G8929 Other chronic pain: Secondary | ICD-10-CM | POA: Diagnosis present

## 2015-04-21 DIAGNOSIS — M5001 Cervical disc disorder with myelopathy,  high cervical region: Secondary | ICD-10-CM | POA: Diagnosis not present

## 2015-04-21 DIAGNOSIS — Z9889 Other specified postprocedural states: Secondary | ICD-10-CM | POA: Insufficient documentation

## 2015-04-21 DIAGNOSIS — K5903 Drug induced constipation: Secondary | ICD-10-CM | POA: Insufficient documentation

## 2015-04-21 DIAGNOSIS — F419 Anxiety disorder, unspecified: Secondary | ICD-10-CM | POA: Diagnosis present

## 2015-04-21 DIAGNOSIS — Z91040 Latex allergy status: Secondary | ICD-10-CM | POA: Diagnosis not present

## 2015-04-21 DIAGNOSIS — Z79899 Other long term (current) drug therapy: Secondary | ICD-10-CM

## 2015-04-21 DIAGNOSIS — M1990 Unspecified osteoarthritis, unspecified site: Secondary | ICD-10-CM | POA: Diagnosis not present

## 2015-04-21 DIAGNOSIS — Z87891 Personal history of nicotine dependence: Secondary | ICD-10-CM

## 2015-04-21 DIAGNOSIS — F4323 Adjustment disorder with mixed anxiety and depressed mood: Secondary | ICD-10-CM | POA: Diagnosis not present

## 2015-04-21 DIAGNOSIS — G959 Disease of spinal cord, unspecified: Secondary | ICD-10-CM | POA: Diagnosis not present

## 2015-04-21 DIAGNOSIS — M1611 Unilateral primary osteoarthritis, right hip: Secondary | ICD-10-CM | POA: Diagnosis present

## 2015-04-21 DIAGNOSIS — Z6841 Body Mass Index (BMI) 40.0 and over, adult: Secondary | ICD-10-CM | POA: Diagnosis not present

## 2015-04-21 DIAGNOSIS — Z886 Allergy status to analgesic agent status: Secondary | ICD-10-CM

## 2015-04-21 DIAGNOSIS — R269 Unspecified abnormalities of gait and mobility: Secondary | ICD-10-CM | POA: Diagnosis not present

## 2015-04-21 DIAGNOSIS — Z8249 Family history of ischemic heart disease and other diseases of the circulatory system: Secondary | ICD-10-CM

## 2015-04-21 DIAGNOSIS — M549 Dorsalgia, unspecified: Secondary | ICD-10-CM | POA: Diagnosis present

## 2015-04-21 DIAGNOSIS — F329 Major depressive disorder, single episode, unspecified: Secondary | ICD-10-CM | POA: Diagnosis present

## 2015-04-21 DIAGNOSIS — M4802 Spinal stenosis, cervical region: Secondary | ICD-10-CM | POA: Diagnosis present

## 2015-04-21 DIAGNOSIS — M4800 Spinal stenosis, site unspecified: Secondary | ICD-10-CM | POA: Diagnosis present

## 2015-04-21 DIAGNOSIS — K5900 Constipation, unspecified: Secondary | ICD-10-CM | POA: Diagnosis not present

## 2015-04-21 DIAGNOSIS — G8250 Quadriplegia, unspecified: Secondary | ICD-10-CM | POA: Diagnosis present

## 2015-04-21 DIAGNOSIS — I10 Essential (primary) hypertension: Secondary | ICD-10-CM | POA: Diagnosis present

## 2015-04-21 DIAGNOSIS — G8918 Other acute postprocedural pain: Secondary | ICD-10-CM

## 2015-04-21 DIAGNOSIS — Z888 Allergy status to other drugs, medicaments and biological substances status: Secondary | ICD-10-CM

## 2015-04-21 DIAGNOSIS — Z91048 Other nonmedicinal substance allergy status: Secondary | ICD-10-CM

## 2015-04-21 DIAGNOSIS — M50021 Cervical disc disorder at C4-C5 level with myelopathy: Secondary | ICD-10-CM | POA: Diagnosis not present

## 2015-04-21 DIAGNOSIS — F32A Depression, unspecified: Secondary | ICD-10-CM | POA: Insufficient documentation

## 2015-04-21 DIAGNOSIS — I158 Other secondary hypertension: Secondary | ICD-10-CM | POA: Diagnosis not present

## 2015-04-21 DIAGNOSIS — R531 Weakness: Secondary | ICD-10-CM | POA: Diagnosis not present

## 2015-04-21 DIAGNOSIS — Z981 Arthrodesis status: Secondary | ICD-10-CM | POA: Diagnosis not present

## 2015-04-21 MED ORDER — OXYCODONE HCL 5 MG PO TABS
5.0000 mg | ORAL_TABLET | ORAL | Status: DC | PRN
  Administered 2015-04-21: 5 mg via ORAL
  Filled 2015-04-21 (×2): qty 1

## 2015-04-21 MED ORDER — ONDANSETRON HCL 4 MG PO TABS
4.0000 mg | ORAL_TABLET | Freq: Four times a day (QID) | ORAL | Status: DC | PRN
Start: 2015-04-21 — End: 2015-04-28

## 2015-04-21 MED ORDER — ONDANSETRON HCL 4 MG/2ML IJ SOLN: 4.0000 mg | Freq: Four times a day (QID) | INTRAMUSCULAR | Status: DC | PRN

## 2015-04-21 MED ORDER — BISACODYL 10 MG RE SUPP
10.0000 mg | Freq: Every day | RECTAL | Status: DC | PRN
  Administered 2015-04-22: 10 mg via RECTAL
  Filled 2015-04-21 (×2): qty 1

## 2015-04-21 MED ORDER — SORBITOL 70 % SOLN
30.0000 mL | Freq: Every day | Status: DC | PRN
  Filled 2015-04-21 (×2): qty 30

## 2015-04-21 MED ORDER — ACETAMINOPHEN 325 MG PO TABS: 325.0000 mg | ORAL_TABLET | ORAL | Status: DC | PRN

## 2015-04-21 MED ORDER — PANTOPRAZOLE SODIUM 40 MG PO TBEC
80.0000 mg | DELAYED_RELEASE_TABLET | Freq: Every day | ORAL | Status: DC
  Administered 2015-04-22 – 2015-04-28 (×7): 80 mg via ORAL
  Filled 2015-04-21 (×8): qty 2

## 2015-04-21 MED ORDER — DOCUSATE SODIUM 100 MG PO CAPS
100.0000 mg | ORAL_CAPSULE | Freq: Two times a day (BID) | ORAL | Status: DC
  Administered 2015-04-21 – 2015-04-22 (×2): 100 mg via ORAL
  Filled 2015-04-21 (×2): qty 1

## 2015-04-21 MED ORDER — CITALOPRAM HYDROBROMIDE 20 MG PO TABS
20.0000 mg | ORAL_TABLET | Freq: Every day | ORAL | Status: DC
  Administered 2015-04-22 – 2015-04-23 (×2): 20 mg via ORAL
  Filled 2015-04-21 (×3): qty 1

## 2015-04-21 NOTE — Discharge Summary (Signed)
Physician Discharge Summary  Patient ID: Selena Rodgers MRN: [id number] DOB/AGE: 08-Apr-1950 65 y.o.  Admit date: 04/19/2015 Discharge date: 04/21/2015  Admission Diagnoses: C3-4 and C4-5 disc degeneration, herniated disc, spondylosis, stenosis, cervical radiculopathy, cervical myelopathy  Discharge Diagnoses: The same Active Problems:   Cervical spondylosis with myelopathy   Other secondary hypertension   Post-operative pain   Arthritis   Discharged Condition: fair  Hospital Course: I performed a C3-4 and C4-5 anterior cervical discectomy, fusion, and plating on the patient on 04/19/2015. The surgery went well.  The patient's postoperative course was unremarkable. We had PT and OT see the patient as she was quadriparetic prior to and after surgery. She had difficulty with ambulation. Rehabilitation saw the patient and recommended inpatient rehabilitation. She was discharged to rehabilitation on 04/21/2015  Consults: PT, OT, rehabilitation Significant Diagnostic Studies: None Treatments: C3-4 and C4-5 anterior cervical discectomy, fusion, and plating. Discharge Exam: Blood pressure 136/82, pulse 93, temperature 98.1 F (36.7 C), temperature source Oral, resp. rate 20, height 5\' 7"  (1.702 m), weight 124.739 kg (275 lb), SpO2 97 %. The patient is alert and pleasant. She looks well. Her dressing is clean and dry. There is no hematoma or shift. She is moving all 4 extremities well.  Disposition: Rehabilitation  Discharge Instructions    Call MD for:  difficulty breathing, headache or visual disturbances    Complete by:  As directed      Call MD for:  extreme fatigue    Complete by:  As directed      Call MD for:  hives    Complete by:  As directed      Call MD for:  persistant dizziness or light-headedness    Complete by:  As directed      Call MD for:  persistant nausea and vomiting    Complete by:  As directed      Call MD for:  redness, tenderness, or signs of infection  (pain, swelling, redness, odor or green/yellow discharge around incision site)    Complete by:  As directed      Call MD for:  severe uncontrolled pain    Complete by:  As directed      Call MD for:  temperature >100.4    Complete by:  As directed      Diet - low sodium heart healthy    Complete by:  As directed      Discharge instructions    Complete by:  As directed   Call [phone] for a followup appointment. Take a stool softener while you are using pain medications.     Driving Restrictions    Complete by:  As directed   Do not drive for 2 weeks.     Increase activity slowly    Complete by:  As directed      Lifting restrictions    Complete by:  As directed   Do not lift more than 5 pounds. No excessive bending or twisting.     May shower / Bathe    Complete by:  As directed   He may shower after the pain she is removed 3 days after surgery. Leave the incision alone.     Remove dressing in 24 hours    Complete by:  As directed             Medication List    STOP taking these medications        naproxen 500 MG EC tablet  Commonly known as:  EC NAPROSYN      TAKE these medications        citalopram 20 MG tablet  Commonly known as:  CELEXA  Take 1 tablet by mouth daily.     cyclobenzaprine 10 MG tablet  Commonly known as:  FLEXERIL  Take 1 tablet (10 mg total) by mouth every 8 (eight) hours as needed for muscle spasms.     omeprazole 20 MG capsule  Commonly known as:  PRILOSEC  Take 20 mg by mouth daily.     oxycodone 5 MG capsule  Commonly known as:  OXY-IR  Take 1 capsule (5 mg total) by mouth every 4 (four) hours as needed.     vitamin B-12 1000 MCG tablet  Commonly known as:  CYANOCOBALAMIN  Take 1,000 mcg by mouth daily.         SignedOphelia Charter 04/21/2015, 10:57 AM

## 2015-04-21 NOTE — PMR Pre-admission (Signed)
PMR Admission Coordinator Pre-Admission Assessment  Patient: Selena Rodgers is an 65 y.o., female MRN: [id number] DOB: 07/29/1950 Height: 5\' 7"  (170.2 cm) Weight: 124.739 kg (275 lb)              Insurance Information HMO:   PPO:  yes     PCP:      IPA:      80/20:      OTHER:  PRIMARYJanit Rodgers      Policy#:  [id number]      Subscriber:  self CM Name:  Selena Rodgers      Phone#:  [phone] ext [phone]     Fax#:  [phone] Pre-Cert#:  [id number], Selena Rodgers received for IP Rehab admission with phone call follow up due 04/27/15 to [place] (phone [phone], ext [phone])      Employer:  [employer] Benefits:  Phone #:  [phone]     Name:  Selena Rodgers. Rodgers:  [birth year]      Deduct:  $0      Out of Pocket Max:  [phone]      Life Max:  n/a CIR:  $500 copay then 80%/20%      SNF:  80%/20% Outpatient:  $20 copay     Home Health:  80%  (Tier 3 benefits with [employer])     Co-Pay:  20% DME:  80%   (Tier 3 benefits with [employer])  Co-Pay:  20% Providers:  In network SECONDARY:       Policy#:       Subscriber:  CM Name:       Phone#:      Fax#:  Pre-Cert#:       Employer:  Benefits:  Phone #:      Name:  Selena Rodgers:      Deduct:       Out of Pocket Max:       Life Max:  CIR:       SNF:  Outpatient:      Co-Pay:  Home Health:       Co-Pay:  DME:      Co-Pay:   Medicaid Application Rodgers:       Case Manager:  Disability Application Rodgers:       Case Worker:   Emergency Contact Information Contact Information    Name Relation Home Work Mobile   Selena Rodgers Sister [phone]     Current Medical History  Patient Admitting Diagnosis: Cervical myelopathy History of Present Illness: [name] is a 65 y.o. right handed female with progressive numbness and weakness in hands, quadriparesis and difficulty with ambulation. Patient lives alone in [place] works at [employer] as a Research scientist (physical sciences). She was using a walker prior to admission. One  level home with a ramp. Her sister has been staying with her at night. Presented 04/19/2015 after recent MRI demonstrated severe spinal stenosis at C3-4 and C4-5. Underwent C3-4 and C4-5 anterior cervical discectomy decompression, C3-4 and C4-5 interbody arthrodesis 04/19/2015 per [name]. Hospital course pain management. Decadron protocol initiated. Aspen cervical collar applied. Physical and occupational therapy evaluations completed with recommendations of physical medicine rehabilitation consult. M.D. has requested physical medicine rehabilitation consult. Patient was admitted for a comprehensive rehabilitation program      Past Medical History  Past Medical History  Diagnosis Rodgers  . Spinal stenosis   . Chronic back pain   . Arthritis   . Umbilical hernia   . Ventral hernia  Family History  family history includes Diabetes in her maternal grandfather, maternal grandmother, paternal grandfather, and paternal grandmother; Hypertension in her mother.  Prior Rehab/Hospitalizations:  Has the patient had major surgery during 100 days prior to admission? No  Current Medications   Current facility-administered medications:  .  alum & mag hydroxide-simeth (MAALOX/MYLANTA) 200-200-20 MG/5ML suspension 30 mL, 30 mL, Oral, Q6H PRN, Newman Pies, MD .  bisacodyl (DULCOLAX) suppository 10 mg, 10 mg, Rectal, Daily PRN, Newman Pies, MD .  citalopram (CELEXA) tablet 20 mg, 20 mg, Oral, Daily, Newman Pies, MD, 20 mg at 04/21/15 1001 .  cyclobenzaprine (FLEXERIL) tablet 10 mg, 10 mg, Oral, Q8H PRN, Newman Pies, MD, 10 mg at 04/20/15 2116 .  docusate sodium (COLACE) capsule 100 mg, 100 mg, Oral, BID, Newman Pies, MD, 100 mg at 04/21/15 1001 .  lactated ringers infusion, , Intravenous, Continuous, Annye Asa, MD, Last Rate: 50 mL/hr at 04/19/15 1037 .  lactated ringers infusion, , Intravenous, Continuous, Newman Pies, MD .  menthol-cetylpyridinium (CEPACOL) lozenge 3  mg, 1 lozenge, Oral, PRN, 3 mg at 04/21/15 0450 **OR** phenol (CHLORASEPTIC) mouth spray 1 spray, 1 spray, Mouth/Throat, PRN, Newman Pies, MD .  morphine 2 MG/ML injection 1-4 mg, 1-4 mg, Intravenous, Q3H PRN, Newman Pies, MD, 2 mg at 04/20/15 0518 .  ondansetron (ZOFRAN) injection 4 mg, 4 mg, Intravenous, Q4H PRN, Newman Pies, MD .  oxyCODONE (Oxy IR/ROXICODONE) immediate release tablet 5 mg, 5 mg, Oral, Q3H PRN, Newman Pies, MD, 5 mg at 04/21/15 1059 .  pantoprazole (PROTONIX) EC tablet 80 mg, 80 mg, Oral, Daily, Newman Pies, MD, 80 mg at 04/21/15 1001  Patients Current Diet: Diet regular Room service appropriate?: Yes; Fluid consistency:: Thin Diet - low sodium heart healthy  Precautions / Restrictions Precautions Precautions: Fall, Cervical Precaution Comments: talked with pt about keeping neck straight Cervical Brace: Hard collar, Other (comment) (left note for MD to clarify when brace to be used) Restrictions Weight Bearing Restrictions: No   Has the patient had 2 or more falls or a fall with injury in the past year?Yes; 3-4 falls in the past year, usually falls posteriorly (feels like she is going backward at times)  Prior Activity Level Household: Since early March, pt. has essentially been homebound due to weakness and pain.  Prior to this, she was working full time.    Home Assistive Devices / Equipment Home Assistive Devices/Equipment: Grab bars around toilet, Grab bars in shower, Crutches, Cane (specify quad or straight), Raised toilet seat with rails, Shower chair with back, Shower chair without back, Environmental consultant (specify type), Wheelchair Home Equipment: Environmental consultant - 2 wheels, Bedside commode, Tub bench, Wheelchair - power, Environmental consultant - standard, Crutches, Adaptive equipment (lift chair; rails on bed; rollator?)  Prior Device Use: Indicate devices/aids used by the patient prior to current illness, exacerbation or injury? Walker  Prior Functional Level Prior  Function Level of Independence: Needs assistance Gait / Transfers Assistance Needed: Over the past few weeks has had multiple episodes where she could not stand (after car rides).  Otherwise pt was using RW. family has been assisting pt with transfers. ADL's / Homemaking Assistance Needed: Sister assisted with bathing, dressing recently.  Has someone who cleans every two weeks.  Does her own cooking. Comments: PTA pt was still working 40 hrs/wk until the past 2 wks  Self Care: Did the patient need help bathing, dressing, using the toilet or eating?  Needed some help since early Marc, 2017; prior to this she was totally  independent  Indoor Mobility: Did the patient need assistance with walking from room to room (with or without device)? Independent  Stairs: Did the patient need assistance with internal or external stairs n/a ramped entrance to home  Functional Cognition: Did the patient need help planning regular tasks such as shopping or remembering to take medications? Independent  Current Functional Level Cognition  Overall Cognitive Status: Impaired/Different from baseline Orientation Level: Oriented X4 General Comments: Pt alert and oriented x4.  Pt reports that her cat came to visit her and was lying in bed w/ her this morning.  Aside from this comment pt appears cognitively WNL.    Extremity Assessment (includes Sensation/Coordination)  Upper Extremity Assessment: RUE deficits/detail, LUE deficits/detail RUE Deficits / Details: less than 90 degrees AROM shoulder flexion; pain in right shoulder RUE Sensation: decreased light touch RUE Coordination: decreased fine motor LUE Sensation: decreased light touch LUE Coordination: decreased fine motor  Lower Extremity Assessment: Defer to PT evaluation RLE Deficits / Details: strength grossly 5/5 w/ MMT; limited inversion and eversion of Rt ankle due to h/o ankle surgery RLE Sensation: decreased light touch LLE Deficits / Details:  strength grossly 5/5 w/ MMT LLE Sensation: decreased light touch    ADLs  Overall ADL's : Needs assistance/impaired Grooming: Minimal assistance, Sitting Grooming Details (indicate cue type and reason): wiped face with paper towel from crying with no difficulty Lower Body Dressing: Maximal assistance, Sit to/from stand (with +2 Mod assist for sit to stand transfer) Toilet Transfer: Moderate assistance, +2 for physical assistance, +2 for safety/equipment, RW, Ambulation (sit to stand from chair) Toilet Transfer Details (indicate cue type and reason): +2 Mod assist-sit to stand; +2 (for safety)Min guard for ambulation Functional mobility during ADLs: +2 for physical assistance, Moderate assistance, Rolling walker, +2 for safety/equipment (+2 Mod assist-sit to stand; +2 Min guard-ambulation) General ADL Comments: Donned pt's cervical collar-not on when OT went in room. Talked about AE briefly.     Mobility  Overal bed mobility: Needs Assistance, +2 for physical assistance Bed Mobility: Rolling, Sidelying to Sit Rolling: Mod assist, +2 for physical assistance Sidelying to sit: Max assist, +2 for physical assistance General bed mobility comments: Pt resists rolling due to Rt shoulder pain and severe muscle guarding through neck and back.  Max +2 assist to support trunk and manage Bil LEs to sit EOB.      Transfers  Overall transfer level: Needs assistance Equipment used: Rolling walker (2 wheeled) Transfers: Sit to/from Stand Sit to Stand: Mod assist, +2 physical assistance, From elevated surface General transfer comment: Use of bed pad and gait belt in place to assist w/ boost up to standing.  Cues for hand placement. Pt is slow to stand and requires assist to keep RW on floor as pt pulling on it to straighten up.  Assist to control descent to sit in chair.    Ambulation / Gait / Stairs / Wheelchair Mobility  Ambulation/Gait Ambulation/Gait assistance: +2 safety/equipment, Min  assist Ambulation Distance (Feet): 50 Feet Assistive device: Rolling walker (2 wheeled) Gait Pattern/deviations: Decreased stride length, Wide base of support, Staggering left, Staggering right, Antalgic, Trunk flexed General Gait Details: Pt demonstrates flexed posture and close chair follow for safety.  Min assist at times due to LOB when pt increases her gait speed.  Dec knee flexion during swing phase and circumduction noted w/ Rt LE. Gait velocity interpretation: <1.8 ft/sec, indicative of risk for recurrent falls    Posture / Balance Dynamic Sitting Balance Sitting balance -  Comments: Relies on UE support and min guard>mod assist due to posterior lean Balance Overall balance assessment: Needs assistance Sitting-balance support: Bilateral upper extremity supported Sitting balance-Leahy Scale: Poor Sitting balance - Comments: Relies on UE support and min guard>mod assist due to posterior lean Postural control: Posterior lean Standing balance support: Bilateral upper extremity supported, During functional activity Standing balance-Leahy Scale: Poor Standing balance comment: Relies on Bil UE support and outside assist for balance    Special needs/care consideration BiPAP/CPAP   no CPM   no Continuous Drip IV   no Dialysis   no         Life Vest   no Oxygen   no Special Bed   no Trach Size   no Wound Vac (area)   no       Skin   Anterior cervical surgical incision with bandage clean and dry                             Bowel mgmt:   Last BM 04/18/15 continent Bladder mgmt: continent, using BSC with assist  Diabetic mgmt no     Previous Home Environment Living Arrangements: Alone Available Help at Discharge:  (pt. has several family members who can assist intermittently) Type of Home: House Home Access: Ramped entrance Bathroom Shower/Tub: Research officer, trade union Accessibility: Yes How Accessible: Accessible via walker Home Care Services: No Additional Comments: sister has  been staying w/ her at night, Aunt and mom check on her during the day  Discharge Living Setting Plans for Discharge Living Setting: Patient's home Type of Home at Discharge: House Discharge Home Layout: One level Discharge Home Access: Ramped entrance Discharge Bathroom Shower/Tub: Tub/shower unit Discharge Bathroom Toilet: Standard (uses 3 n 1 over commode) Discharge Bathroom Accessibility: Yes How Accessible: Accessible via walker Does the patient have any problems obtaining your medications?: No  Social/Family/Support Systems Patient Roles:  (pt. is close to her sister, brother and mother) Anticipated Caregiver: sister, Arna Medici Call  Anticipated Caregiver's Contact Information: [phone], cell; [phone] work  (select option for office) Ability/Limitations of Caregiver: Sister and brother work day time hours but can assist as much as needed in the evenings.  Pt's elderly mom can stay with pt. during the day but cannot provide physical assistance  Caregiver Availability: Other (Comment) (most of the time) Discharge Plan Discussed with Primary Caregiver: Yes Is Caregiver In Agreement with Plan?: Yes Does Caregiver/Family have Issues with Lodging/Transportation while Pt is in Rehab?: No   Goals/Additional Needs Patient/Family Goal for Rehab: supervision and minimal assist PT/OT Expected length of stay: 7-9 days Cultural Considerations: n/a Dietary Needs: regular diet, thin liquids Equipment Needs: TBA Additional Information: Pt. had a bad experience on acute attempting to sit in recliner chair, stating it was vewry comfortable.  We were able to locate a straight back chair for her and she is much more comfortable.  I have requested nursing staff on CIR obtain a straightback chair for her use while on CIR. Pt/Family Agrees to Admission and willing to participate: Yes Program Orientation Provided & Reviewed with Pt/Caregiver Including Roles  & Responsibilities:  Yes   Decrease burden of Care through IP rehab admission: n/a   Possible need for SNF placement upon discharge:  Not anticipated   Patient Condition: This patient's condition remains as documented in the consult dated 04/20/15 , in which the Rehabilitation Physician determined and documented that the patient's condition is appropriate for intensive rehabilitative care  in an inpatient rehabilitation facility. Will admit to inpatient rehab today.  Preadmission Screen Completed By:  [name], 04/21/2015 12:16 PM ______________________________________________________________________   Discussed status with Dr.  Posey Pronto on 04/21/15 at  1216  and received telephone approval for admission today.  Admission Coordinator:  [name], time 1216 Sudie Grumbling 04/21/15

## 2015-04-21 NOTE — Evaluation (Signed)
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 Adjustment disorder with mixed anxiety and depressed mood   . Slow transit constipation   . Myelopathy ([place]) 04/21/2015  . Cervical myelopathy ([place])   . Constipation due to pain medication   . Depression   . Primary osteoarthritis of right shoulder   . S/P spinal surgery   . Abnormality of gait   . Weakness   . Other secondary hypertension   . Post-operative pain   . Arthritis   . Cervical spondylosis with myelopathy 04/19/2015  . 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 Ventral hernia 10/07/2014  . Current tobacco use 10/06/2014  . Anxiety 09/17/2014  . Back pain, chronic 09/17/2014  . Clinical depression 09/17/2014  . BP (high blood pressure) 09/17/2014  . Borderline diabetes 09/17/2014  . Arthritis, degenerative 09/17/2014  . Spinal stenosis 09/17/2014  . Compulsive tobacco user syndrome 09/17/2014  . Abdominal mass 06/30/2014  . Incisional hernia, without obstruction or gangrene 06/30/2014  . Morbid obesity ([place]) 06/30/2014  . Hypercholesteremia 05/23/2013  . 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 Hypercholesterolemia 05/23/2013    Past Medical History:  Past Medical History  Diagnosis Date  . Spinal stenosis   . Chronic back pain   . Arthritis   . Umbilical hernia   . Ventral hernia    Past Surgical History:  Past Surgical History  Procedure Laterality Date  . Cholecystectomy    . Breast biopsy    . Foot surgery Right   . Carpal tunnel release    . Umbilical hernia repair    . Ventral hernia repair   12/26/2013    Dr. Pat Patrick  . Irrigation and debridement abdomen  01/10/2014    Dr. Marina Gravel  . Anterior cervical decomp/discectomy fusion N/A 04/19/2015    Procedure: ANTERIOR CERVICAL DECOMPRESSION/DISCECTOMY FUSION INTERBODY PROTHESIS PLATING BONEGRAFT CERVICAL THREE-FOUR ,CERVICAL FOUR-FIVE;  Surgeon: Newman Pies, MD;  Location: [facility];  Service: Neurosurgery;  Laterality: N/A;    Assessment & Plan Clinical Impression: Patient is a 65 y.o. right handed female with progressive numbness and weakness in hands, quadriparesis and difficulty with ambulation. Patient lives alone in [place] works at [employer] as a Research scientist (physical sciences). She was using a walker prior to admission. One level home with a ramp. Her sister has been staying with her at night. Presented 04/19/2015 after recent MRI demonstrated severe spinal stenosis at C3-4 and C4-5. Underwent C3-4 and C4-5 anterior cervical discectomy decompression, C3-4 and C4-5 interbody arthrodesis 04/19/2015 per [name]. Hospital course pain management. Decadron protocol initiated. Aspen cervical collar applied.   Patient transferred to CIR on 04/21/2015.    Patient currently requires moderate assistance with basic self-care skills secondary to muscle weakness.  Prior to hospitalization, patient could complete BADL independently using DME; iADL with assistance.  Patient will benefit from skilled intervention to increase independence with basic self-care skills prior to discharge home with care partner.  Anticipate patient will require minimal physical assistance and follow up home health.  OT - End of Session Activity Tolerance: Tolerates 30+ min activity with multiple rests Endurance Deficit: Yes  OT Assessment Rehab Potential (ACUTE ONLY): Good OT Patient demonstrates impairments in the following area(s): Balance;Endurance;Safety;Behavior (High Anxiety; needs relief from [place] of collar with SUPERVISION during  treatment) OT Basic ADL's Functional Problem(s): Eating;Grooming;Bathing;Dressing OT Advanced ADL's Functional Problem(s): Simple Meal Preparation OT Transfers Functional Problem(s): Toilet OT Additional Impairment(s): Fuctional Use of Upper Extremity (Impaired FMC bilateral hands d/t intermittent paresthesias) OT Plan OT Intensity: Minimum of 1-2 x/day, 45 to 90 minutes OT Frequency: 5 out of 7 days OT Duration/Estimated Length of Stay: 10-16 days OT Treatment/Interventions: Balance/vestibular training;Discharge planning;Therapeutic Exercise;Therapeutic Activities;Self Care/advanced ADL retraining;UE/LE Coordination activities;Patient/family education;UE/LE Strength taining/ROM;Functional mobility training OT Self Feeding Anticipated Outcome(s): Mod I OT Basic Self-Care Anticipated Outcome(s): Min A OT Toileting Anticipated Outcome(s): Mod I OT Bathroom Transfers Anticipated Outcome(s): Supervision OT Recommendation Patient destination: Home Follow Up Recommendations: Home health OT Equipment Details: Has all DME (w/c, RW, tub bench, BSC, ramp)   Skilled Therapeutic Intervention OT initial evaluation completed with treatment provided to address adherence to precautions, emotional support, pt re-ed on methods and goals of treatment, functional transfers and mobility using DME/AD, and adapted bathing/dressing skills sitting/standing supported at sink.   Pt with moderate anxiety and confusion relating to use of Aspen collar.   OT re-educated pt on precautions and care when collar can be removed (with therapists only under strict supervision with no ROM at neck allowed).  Pt's anxiety resolved and she responded well, exhibiting humor frequently during session and able to rise from bed to toilet and bathe at sink with overall moderate assist.   Pt reports intermittent paresthesias and incoordination at bilateral hands but was observed as not limited during self-feeding.  OT  Evaluation Precautions/Restrictions  Precautions Precautions: Fall;Cervical Precaution Comments: talked with pt about keeping neck straight Required Braces or Orthoses: Cervical Brace Cervical Brace: Hard collar;Other (comment) Restrictions Weight Bearing Restrictions: No   General Chart Reviewed: Yes Family/Caregiver Present: No   Vital Signs Therapy Vitals Temp: 97.5 F (36.4 C) Temp Source: Oral Pulse Rate: 75 Resp: 18 BP: (!) 135/52 mmHg Patient Position (if appropriate): Lying Oxygen Therapy SpO2: 97 % O2 Device: Not Delivered  Pain Pain Assessment Pain Assessment: 0-10 Pain Score: 4  Pain Type: Acute pain Pain Location: Neck Pain Descriptors / Indicators: Aching Pain Onset: On-going Multiple Pain Sites: Yes 2nd Pain Site Pain Score: 4 Pain Type: Neuropathic pain Pain Location: Leg Pain Orientation: Right Pain Descriptors / Indicators: Jabbing;Heaviness Pain Frequency: Intermittent Pain Onset: With Activity Pain Intervention(s):  (Agravated by excessive staff manipulation/assist with bed mobility.   Pt able to direct caregviers.)   Home Living/Prior Functioning Home Living Type of Home: House Home Access: Ramped entrance Home Layout: One level Bathroom Shower/Tub: Research officer, trade union Accessibility: Yes Additional Comments: sister has been staying w/ her at night, Aunt and mom check on her during the day  Lives With: Alone IADL History Homemaking Responsibilities: Yes Meal Prep Responsibility: Primary (convenience meals, microwavable) Laundry Responsibility: No Cleaning Responsibility: No Bill Paying/Finance Responsibility: Primary Shopping Responsibility: Secondary Child Care Responsibility: No Homemaking Comments: Pays for assist with homemaking Current License: Yes Mode of Transportation: Car Education: HS + college Occupation: Full time employment 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, crouchet, sew,  read Prior Function Level of Independence: Independent with basic ADLs, Needs assistance with homemaking Laundry: Total (pays Aunt) Vacuuming: Maximal Gardening: Total Shopping: Minimal  Able to Take Stairs?: No Driving: Yes Vocation: Full time employment Vocation Requirements: desk Comments: PTA pt was still working 40 hrs/wk  until the past 2 wks   ADL ADL ADL Comments: See Functional Assessment Tool   Vision/Perception  Vision- History Baseline Vision/History: Wears glasses Wears Glasses: At all times Patient Visual Report: No change from baseline   Cognition Overall Cognitive Status: Within Functional Limits for tasks assessed Arousal/Alertness: Awake/alert Orientation Level: Person;Place;Situation Person: Oriented Place: Oriented Situation: Oriented Year: 2017 Month: April Day of Week: Correct Memory: Appears intact Immediate Memory Recall: Sock;Blue;Bed Memory Recall: Sock;Blue;Bed Memory Recall Sock: Without Cue Memory Recall Blue: Without Cue Memory Recall Bed: Without Cue Attention: Alternating Awareness: Appears intact Problem Solving: Appears intact Safety/Judgment: Appears intact   Sensation Sensation Light Touch: Impaired Detail Light Touch Impaired Details: Impaired RUE;Impaired LUE;Impaired RLE;Impaired LLE Hot/Cold: Appears Intact Proprioception: Impaired by gross assessment Additional Comments: Inconsistent paresthesias bilateral upper/lower extremities, distally.    Coordination Gross Motor Movements are Fluid and Coordinated: Yes Fine Motor Movements are Fluid and Coordinated: No   Motor  Motor Motor: Within Functional Limits   Mobility  Bed Mobility Bed Mobility: Rolling Right;Rolling Left;Supine to Sit;Sit to Supine Rolling Right: 3: Mod assist Rolling Left: 3: Mod assist Rolling Left Details: Verbal cues for sequencing;Verbal cues for technique;Verbal cues for precautions/safety;Verbal cues for gait pattern;Verbal cues for safe use  of DME/AE;Tactile cues for weight beaing Supine to Sit: 2: Max assist Supine to Sit Details: Verbal cues for sequencing;Verbal cues for technique;Verbal cues for precautions/safety;Verbal cues for safe use of DME/AE;Manual facilitation for weight shifting;Manual facilitation for placement;Manual facilitation for weight bearing Sit to Supine: 2: Max assist Sit to Supine - Details: Verbal cues for safe use of DME/AE;Verbal cues for precautions/safety;Verbal cues for technique;Manual facilitation for weight shifting;Manual facilitation for placement;Manual facilitation for weight bearing   Trunk/Postural Assessment  Cervical Assessment Cervical Assessment: Within Functional Limits Cervical AROM Overall Cervical AROM Comments: Aspen collar. no AROM.  Thoracic Assessment Thoracic Assessment: Exceptions to [facility] Thoracic AROM Overall Thoracic AROM Comments: mild kyphotic posture and mild scoliosis.  Lumbar Assessment Lumbar Assessment: Exceptions to [facility] Lumbar AROM Overall Lumbar AROM Comments: decreased lumbar lordosis.  Postural Control Postural Control: Deficits on evaluation Righting Reactions: deceased due to pain in hip and numbness in hands and BLE.    Balance Balance Balance Assessed: Yes Static Sitting Balance Static Sitting - Comment/# of Minutes: Supervision A from PT Dynamic Sitting Balance Sitting balance - Comments: Patient requires 1 UE for support in sitting with functional tasks.  Static Standing Balance Static Standing - Comment/# of Minutes: Able to maintain standing balance with BUE support on RW.  Dynamic Standing Balance Dynamic Standing - Comments: Supervision A from PT   Extremity/Trunk Assessment RUE Assessment RUE Assessment: Exceptions to [facility] RUE AROM (degrees) Overall AROM Right Upper Extremity: Due to precautions LUE Assessment LUE Assessment: Exceptions to [facility] LUE AROM (degrees) Overall AROM Left Upper Extremity: Due to precautions   See Function  Navigator for Current Functional Status.   Refer to Care Plan for Long Term Goals  Recommendations for other services: None  Discharge Criteria: Patient will be discharged from OT if patient refuses treatment 3 consecutive times without medical reason, if treatment goals not met, if there is a change in medical status, if patient makes no progress towards goals or if patient is discharged from hospital.  The above assessment, treatment plan, treatment alternatives and goals were discussed and mutually agreed upon: by patient  [facility] 04/22/2015, 4:59 PM

## 2015-04-21 NOTE — Progress Notes (Signed)
Pt ambulated to [facility] with walker and difficulty. Pt was tearful and cried the whole time saying "I'm dying".  Pt refused to sit in chair this am.    Fredrich Romans, RN

## 2015-04-21 NOTE — H&P (View-Only) (Signed)
Physical Medicine and Rehabilitation Admission H&P    Chief complaint: Neck pain  HPI: Selena Rodgers is a 65 y.o. right handed female with progressive numbness and weakness in hands, quadriparesis and difficulty with ambulation. Patient lives alone in [place] works at [employer] as a Research scientist (physical sciences). She was using a walker prior to admission. One level home with a ramp. Her sister has been staying with her at night. Presented 04/19/2015 after recent MRI demonstrated severe spinal stenosis at C3-4 and C4-5. Underwent C3-4 and C4-5 anterior cervical discectomy decompression, C3-4 and C4-5 interbody arthrodesis 04/19/2015 per [name]. Hospital course pain management. Decadron protocol initiated. Aspen cervical collar applied. Physical and occupational therapy evaluations completed with recommendations of physical medicine rehabilitation consult. M.D. has requested physical medicine rehabilitation consult. Patient was admitted for a comprehensive rehabilitation program  ROS Constitutional: Negative for fever and chills.  HENT: Negative for hearing loss.  Eyes: Negative for blurred vision and double vision.  Respiratory: Negative for cough and shortness of breath.  Cardiovascular: Negative for chest pain, palpitations and leg swelling.  Gastrointestinal: Positive for constipation. Negative for nausea and vomiting.  Genitourinary: Negative for dysuria and hematuria.  Musculoskeletal: Positive for myalgias and neck pain.  Skin: Negative for rash.  Neurological: Positive for sensory change, focal weakness and weakness. Negative for headaches.  Psychiatric/Behavioral: Positive for depression.  All other systems reviewed and are negative   Past Medical History  Diagnosis Date  . Spinal stenosis   . Chronic back pain   . Arthritis   . Umbilical hernia   . Ventral hernia    Past Surgical History  Procedure Laterality Date  . Cholecystectomy      . Breast biopsy    . Foot surgery Right   . Carpal tunnel release    . Umbilical hernia repair    . Ventral hernia repair  12/26/2013    Dr. Pat Patrick  . Irrigation and debridement abdomen  01/10/2014    Dr. Marina Gravel   Family History  Problem Relation Age of Onset  . Diabetes Maternal Grandmother   . Diabetes Maternal Grandfather   . Diabetes Paternal Grandmother   . Diabetes Paternal Grandfather   . Hypertension Mother    Social History:  reports that she quit smoking about 15 months ago. She has never used smokeless tobacco. She reports that she does not drink alcohol or use illicit drugs. Allergies:  Allergies  Allergen Reactions  . Chlorpheniramine Anaphylaxis  . Linseed Oil Anaphylaxis  . Codeine Hives  . Flax Seed Oil [Bio-Flax] Swelling    UNSPECIFIED   . Latex Hives and Other (See Comments)    BLISTERS  . Tape Other (See Comments)    BLISTERS  . 2,4-D Dimethylamine (Amisol) Rash    T-DAP  . Acetaminophen Rash  . Aspirin Rash   Medications Prior to Admission  Medication Sig Dispense Refill  . citalopram (CELEXA) 20 MG tablet Take 1 tablet by mouth daily.    . cyclobenzaprine (FLEXERIL) 10 MG tablet Take 1 tablet (10 mg total) by mouth every 8 (eight) hours as needed for muscle spasms. 30 tablet 0  . naproxen (EC NAPROSYN) 500 MG EC tablet Take 1 tablet by mouth 2 (two) times daily.    Marland Kitchen omeprazole (PRILOSEC) 20 MG capsule Take 20 mg by mouth daily.    Marland Kitchen oxycodone (OXY-IR) 5 MG capsule Take 1 capsule (5 mg total) by mouth every 4 (four) hours as needed. 8 capsule 0  .  vitamin B-12 (CYANOCOBALAMIN) 1000 MCG tablet Take 1,000 mcg by mouth daily.      Home: Home Living Family/patient expects to be discharged to:: Private residence Living Arrangements: Alone Available Help at Discharge: Family, Available PRN/intermittently Type of Home: House Home Access: Ramped entrance [place] Shower/Tub: Tub/shower unit [place]: Environmental consultant - 2 wheels, Bedside commode, Tub bench,  Wheelchair - power, Environmental consultant - standard, Crutches, Adaptive equipment (lift chair; rails on bed; rollator?) Adaptive Equipment: Reacher (long handled sponge/brush?) Additional Comments: sister has been staying w/ her at night, Aunt and mom check on her during the day   Functional History: Prior Function Level of Independence: Needs assistance Gait / Transfers Assistance Needed: Over the past few weeks has had multiple episodes where she could not stand (after car rides).  Otherwise pt was using RW. family has been assisting pt with transfers. ADL's / Homemaking Assistance Needed: Sister assisted with bathing, dressing recently.  Has someone who cleans every two weeks.  Does her own cooking. Comments: PTA pt was still working 40 hrs/wk until the past 2 wks  Functional Status:  Mobility: Bed Mobility General bed mobility comments: not assessed Transfers Overall transfer level: Needs assistance Equipment used: Rolling walker (2 wheeled) Transfers: Sit to/from Stand Sit to Stand: Mod assist, +2 physical assistance General transfer comment: Use of bed pad and gait belt in place to assist with boost up to standing.  Cues for hand placement. Pt is slow to stand and requires assist to keep RW on floor as pt pulling on it to straighten up.  Assist to control descent to sit in chair. Ambulation/Gait Ambulation/Gait assistance: Min guard, +2 safety/equipment Ambulation Distance (Feet): 15 Feet Assistive device: Rolling walker (2 wheeled) Gait Pattern/deviations: Decreased stride length, Trunk flexed General Gait Details: Pt demonstrates flexed posture and close chair follow for safety.  Close min guard due to pt's instability.  Pt becomes tearful at mention of additional rehab and requests to sit. Gait velocity interpretation: <1.8 ft/sec, indicative of risk for recurrent falls    ADL: ADL Overall ADL's : Needs assistance/impaired Grooming: Minimal assistance, Sitting Grooming Details (indicate  cue type and reason): wiped face with paper towel from crying with no difficulty Lower Body Dressing: Maximal assistance, Sit to/from stand (with +2 Mod assist for sit to stand transfer) Toilet Transfer: Moderate assistance, +2 for physical assistance, +2 for safety/equipment, RW, Ambulation (sit to stand from chair) Toilet Transfer Details (indicate cue type and reason): +2 Mod assist-sit to stand; +2 (for safety)Min guard for ambulation Functional mobility during ADLs: +2 for physical assistance, Moderate assistance, Rolling walker, +2 for safety/equipment (+2 Mod assist-sit to stand; +2 Min guard-ambulation) General ADL Comments: Donned pt's cervical collar-not on when OT went in room. Talked about AE briefly.   Cognition: Cognition Overall Cognitive Status: Within Functional Limits for tasks assessed Orientation Level: Oriented X4 Cognition Arousal/Alertness: Awake/alert Behavior During Therapy: WFL for tasks assessed/performed (became tearful) Overall Cognitive Status: Within Functional Limits for tasks assessed  Physical Exam: Blood pressure 153/65, pulse 90, temperature 99 F (37.2 C), temperature source Oral, resp. rate 18, height 5\' 7"  (1.702 m), weight 124.739 kg (275 lb), SpO2 93 %. Physical Exam Constitutional: She is oriented to person, place, and time. She appears well-developed. Obese.  HENT:  Head: Normocephalic and atraumatic.  Eyes: Conjunctivae and EOM are normal.  Neck:  Aspen cervical collar in place  Cardiovascular: Normal rate and regular rhythm.  Respiratory: Effort normal and breath sounds normal. No respiratory distress.  GI: Soft. Bowel  sounds are normal. She exhibits no distension.  + Ventral hernia  Musculoskeletal: She exhibits edema and tenderness.  Neurological: She is alert and oriented to person, place, and time.  Sensation diminished to light touch in bilateral hands DTRs 3+ bilateral lower extremities Motor: Bilateral upper extremities: Shoulder  abduction 4/5 (RUE somewhat limited by pain), distally 4+/5 Bilateral lower extremities: Hip flexion: 4 -/5, knee extension 4+/5, ankle dorsi/plantarflexion 5/5  Skin: Skin is warm and dry.  Psychiatric: Her speech is normal. Judgment normal. Her affect is labile. She is withdrawn. Cognition and memory are normal. She exhibits a depressed mood (improving).  No results found for this or any previous visit (from the past 48 hour(s)). Dg Cervical Spine 2-3 Views  04/19/2015  CLINICAL DATA:  Anterior cervical disc fusion. EXAM: CERVICAL SPINE - 2-3 VIEW COMPARISON:  None. FINDINGS: Two intraoperative lateral projections of the cervical spine were obtained. The first image demonstrates surgical localization of C4-5. The second image demonstrates the patient to be undergoing anterior cervical fusion of C3-4 and C4-5. IMPRESSION: Anterior cervical disc fusion of C3-4 and C4-5. Electronically Signed   By: Marijo Conception, M.D.   On: 04/19/2015 14:54   Medical Problem List and Plan: 1.  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 Patient with Aspen cervical collar 2.  DVT Prophylaxis/Anticoagulation: SCDs. Monitor for any signs of DVT 3. Pain Management: Oxycodone and Robaxin as needed. Monitor with increased mobility 4. Mood: Celexa 20 mg daily 5. Neuropsych: This patient is capable of making decisions on her own behalf. 6. Skin/Wound Care: Routine skin checks 7. Fluids/Electrolytes/Nutrition: Routine I&O with follow-up chemistries 8. Constipation. Laxative assistance 9. 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 10. HTN: Cont to monitor with increased activity.  Treat if warranted.   Post Admission Physician Evaluation: 1. Functional deficits secondary  to  severe cervical stenosis C3-4 and C4-5 status post anterior cervical discectomy decompression. 2. Patient is admitted  to receive collaborative, interdisciplinary care between the physiatrist, rehab nursing staff, and therapy team. 3. Patient's level of medical complexity and substantial therapy needs in context of that medical necessity cannot be provided at a lesser intensity of care such as a SNF. 4. Patient has experienced substantial functional loss from his/her baseline which was documented above under the "Functional History" and "Functional Status" headings.  Judging by the patient's diagnosis, physical exam, and functional history, the patient has potential for functional progress which will result in measurable gains while on inpatient rehab.  These gains will be of substantial and practical use upon discharge  in facilitating mobility and self-care at the household level. 5. Physiatrist will provide 24 hour management of medical needs as well as oversight of the therapy plan/treatment and provide guidance as appropriate regarding the interaction of the two. 6. 24 hour rehab nursing will assist with safety, skin/wound care, disease management, pain management and patient education and help integrate therapy concepts, techniques,education, etc. 7. PT will assess and treat for/with: Lower extremity strength, range of motion, stamina, balance, functional mobility, safety, adaptive techniques and equipment, woundcare, coping skills, pain control, education.   Goals are: Supervision/Mod I. 8. OT will assess and treat for/with: ADL's, functional mobility, safety, upper extremity strength, adaptive techniques and equipment, wound mgt, ego support, and community reintegration.   Goals are: Supervision/Mod I. Therapy may not proceed with showering this patient. 9. Case Management and Social Worker will assess and treat for psychological issues  and discharge planning. 10. Team conference will be held weekly to assess progress toward goals and to determine barriers to discharge. 11. Patient will receive at least 3 hours of  therapy per day at least 5 days per week. 12. ELOS: 13-17 days.        13. Prognosis:  good  Delice Lesch, MD 04/20/2015

## 2015-04-21 NOTE — Progress Notes (Signed)
Inpatient Rehabilitation  I received approval from [facility] to admit pt. to CIR.  [name] has given medical clearance.  I have updated pt's RN Manus Gunning and Jacqualin Combes, RNCM with the plan.  Pt. Is agreeable.  I will make all arrangements for admission later today.  Please call if questions.  [place] Admissions Coordinator Cell [phone] Office [phone]

## 2015-04-21 NOTE — Progress Notes (Signed)
Selena Lorie Phenix, MD Physician Signed Physical Medicine and Rehabilitation Consult Note 04/20/2015 7:22 AM  Related encounter: Admission (Current) from 04/19/2015 in [place] All Collapse All        Physical Medicine and Rehabilitation Consult Reason for Consult: Cervical myelopathy Referring Physician: [name]   HPI: Selena Rodgers is a 65 y.o. right handed female with progressive numbness and weakness in hands, quadriparesis and difficulty with ambulation. 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 One level home with a ramp. Presented 04/19/2015 after recent MRI demonstrated severe spinal stenosis at C3-4 and C4-5. Underwent C3-4 and C4-5 anterior cervical discectomy decompression, C3-4 and C4-5 interbody arthrodesis 04/19/2015 per [name]. Hospital course pain management. Decadron protocol initiated. Aspen cervical collar applied. Physical and occupational therapy evaluations pending. M.D. has requested physical medicine rehabilitation consult.   Review of Systems  Constitutional: Negative for fever and chills.  HENT: Negative for hearing loss.  Eyes: Negative for blurred vision and double vision.  Respiratory: Negative for cough and shortness of breath.  Cardiovascular: Negative for chest pain, palpitations and leg swelling.  Gastrointestinal: Positive for constipation. Negative for nausea and vomiting.  Genitourinary: Negative for dysuria and hematuria.  Musculoskeletal: Positive for myalgias and neck pain.  Skin: Negative for rash.  Neurological: Positive for sensory change, focal weakness and weakness. Negative for headaches.  Psychiatric/Behavioral: Positive for depression.  All other systems reviewed and are negative.  Past Medical History  Diagnosis Date  . Spinal stenosis   . Chronic back  pain   . Arthritis   . Umbilical hernia   . Ventral hernia    Past Surgical History  Procedure Laterality Date  . Cholecystectomy    . Breast biopsy    . Foot surgery Right   . Carpal tunnel release    . Umbilical hernia repair    . Ventral hernia repair  12/26/2013    Dr. Pat Patrick  . Irrigation and debridement abdomen  01/10/2014    Dr. Marina Gravel   Family History  Problem Relation Age of Onset  . Diabetes Maternal Grandmother   . Diabetes Maternal Grandfather   . Diabetes Paternal Grandmother   . Diabetes Paternal Grandfather   . Hypertension Mother    Social History:  reports that she quit smoking about 15 months ago. She has never used smokeless tobacco. She reports that she does not drink alcohol or use illicit drugs. Allergies:  Allergies  Allergen Reactions  . Chlorpheniramine Anaphylaxis  . Linseed Oil Anaphylaxis  . Codeine Hives  . Flax Seed Oil [Bio-Flax] Swelling    UNSPECIFIED   . Latex Hives and Other (See Comments)    BLISTERS  . Tape Other (See Comments)    BLISTERS  . 2,4-D Dimethylamine (Amisol) Rash    T-DAP  . Acetaminophen Rash  . Aspirin Rash   Medications Prior to Admission  Medication Sig Dispense Refill  . citalopram (CELEXA) 20 MG tablet Take 1 tablet by mouth daily.    . cyclobenzaprine (FLEXERIL) 10 MG tablet Take 1 tablet (10 mg total) by mouth every 8 (eight) hours as needed for muscle spasms. 30 tablet 0  . naproxen (EC NAPROSYN) 500 MG EC tablet Take 1 tablet by mouth 2 (two) times daily.    Marland Kitchen omeprazole (PRILOSEC) 20 MG capsule Take 20 mg by mouth daily.    Marland Kitchen  oxycodone (OXY-IR) 5 MG capsule Take 1 capsule (5 mg total) by mouth every 4 (four) hours as needed. 8 capsule 0  . vitamin B-12 (CYANOCOBALAMIN) 1000 MCG tablet Take 1,000 mcg by mouth daily.      Home:    Functional History:     Functional Status:  Mobility:          ADL:    Cognition: Cognition Orientation Level: Oriented X4    Blood pressure 164/72, pulse 76, temperature 98.6 F (37 C), temperature source Oral, resp. rate 16, height 5\' 7"  (1.702 m), weight 124.739 kg (275 lb), SpO2 98 %. Physical Exam  Vitals reviewed. Constitutional: She is oriented to person, place, and time. She appears well-developed.  65 year old obese female sitting up in chair.  HENT:  Head: Normocephalic and atraumatic.  Eyes: Conjunctivae and EOM are normal.  Neck:  Aspen cervical collar in place  Cardiovascular: Normal rate and regular rhythm.  Respiratory: Effort normal and breath sounds normal. No respiratory distress.  GI: Soft. Bowel sounds are normal. She exhibits no distension.  + Ventral hernia  Musculoskeletal: She exhibits edema and tenderness.  Neurological: She is alert and oriented to person, place, and time.  Sensation diminished to light touch in bilateral hands DTRs 3+ bilateral lower extremities Motor: Bilateral upper extremities: Shoulder abduction 3+/5, distally 4+/5 Bilateral lower extremities: Hip flexion: 4 -/5, knee extension 4+/5, ankle dorsi/plantarflexion 5/5  Skin: Skin is warm and dry.  Psychiatric: Her speech is normal. Judgment normal. Her affect is labile. She is withdrawn. Cognition and memory are normal. She exhibits a depressed mood.     Lab Results Last 24 Hours    No results found for this or any previous visit (from the past 24 hour(s)).    Imaging Results (Last 48 hours)    Dg Cervical Spine 2-3 Views  04/19/2015 CLINICAL DATA: Anterior cervical disc fusion. EXAM: CERVICAL SPINE - 2-3 VIEW COMPARISON: None. FINDINGS: Two intraoperative lateral projections of the cervical spine were obtained. The first image demonstrates surgical localization of C4-5. The second image demonstrates the patient to be undergoing anterior cervical fusion of C3-4 and C4-5. IMPRESSION: Anterior  cervical disc fusion of C3-4 and C4-5. Electronically Signed By: Marijo Conception, M.D. On: 04/19/2015 14:54     Assessment/Plan: Diagnosis: Cervical myelopathy Labs and images independently reviewed. Records reviewed and summated above.  1. Does the need for close, 24 hr/day medical supervision in concert with the patient's rehab needs make it unreasonable for this patient to be served in a less intensive setting? Potentially  2. Co-Morbidities requiring supervision/potential complications: pain management (Biofeedback training with therapies to help reduce reliance on opiate pain medications, monitor pain control during therapies, and sedation at rest and titrate to maximum efficacy to ensure participation and gains in therapies), right shoulder arthritis (Biofeedback training with therapies to help reduce reliance on opiate pain medications, monitor pain control during therapies, and sedation at rest and titrate to maximum efficacy to ensure participation and gains in therapies), HTN (monitor and provide prns in accordance with increased physical exertion and pain) 3. Due to safety, skin/wound care, disease management, pain management and patient education, does the patient require 24 hr/day rehab nursing? Potentially 4. Does the patient require coordinated care of a physician, rehab nurse, PT (1-2 hrs/day, 5 days/week) and OT (1-2 hrs/day, 5 days/week) to address physical and functional deficits in the context of the above medical diagnosis(es)? Potentially Addressing deficits in the following areas: TBD 5. Can the patient actively participate  in an intensive therapy program of at least 3 hrs of therapy per day at least 5 days per week? Potentially 6. The potential for patient to make measurable gains while on inpatient rehab is TBD 7. Anticipated functional outcomes upon discharge from inpatient rehab are TBD with PT, TBD with OT, TBD with SLP. 8. Estimated rehab length of stay to reach  the above functional goals is: TBD 9. Does the patient have adequate social supports and living environment to accommodate these discharge functional goals? Potentially 10. Anticipated D/C setting: Home 11. Anticipated post D/C treatments: HH therapy and Home excercise program 12. Overall Rehab/Functional Prognosis: excellent  RECOMMENDATIONS: This patient's condition is appropriate for continued rehabilitative care in the following setting: Patient is unsure whether she would like to go to CIR. At this time. She has a strong desire to go home. Further, patient has not been evaluated by therapies. Will await formal therapy consults. If patient does decide to go home, would recommend follow-up with PM&R as outpatient. Patient has agreed to participate in recommended program. Potentially Note that insurance prior authorization may be required for reimbursement for recommended care.  Comment: Rehab Admissions Coordinator to follow up.  Delice Lesch, MD 04/20/2015       Revision History     Date/Time User Provider Type Action   04/20/2015 10:18 AM Selena Lorie Phenix, MD Physician Sign   04/20/2015 7:37 AM Cathlyn Parsons, PA-C Physician Assistant [place]   View Details Report       Routing History     Date/Time From To Method   04/20/2015 10:18 AM Selena Lorie Phenix, MD Madelyn Brunner, MD Fax

## 2015-04-21 NOTE — Interval H&P Note (Signed)
Selena Rodgers was admitted today to Inpatient Rehabilitation with the diagnosis of severe cervical stenosis C3-4 and C4-5 status post anterior cervical discectomy decompression 04/19/2015.  The patient's history has been reviewed, patient examined, and there is no change in status.  Patient continues to be appropriate for intensive inpatient rehabilitation.  I have reviewed the patient's chart and labs.  Questions were answered to the patient's satisfaction. The PAPE has been reviewed and assessment remains appropriate.  Soma Bachand Lorie Phenix 04/21/2015, 5:03 PM

## 2015-04-21 NOTE — Care Management Note (Signed)
Case Management Note  Patient Details  Name: Selena Rodgers MRN: [id number] Date of Birth: 1950-01-18  Subjective/Objective:                    Action/Plan: Plan is for patient to discharge to CIR today. No further needs per CM.   Expected Discharge Date:                  Expected Discharge Plan:  [place]  In-House Referral:     Discharge planning Services     Post Acute Care Choice:    Choice offered to:     DME Arranged:    DME Agency:     HH Arranged:    [place] Agency:     Status of Service:  Completed, signed off  Medicare Important Message Given:    Date Medicare IM Given:    Medicare IM give by:    Date Additional Medicare IM Given:    Additional Medicare Important Message give by:     If discussed at [place] of Stay Meetings, dates discussed:    Additional Comments:  Pollie Friar, RN 04/21/2015, 10:14 AM

## 2015-04-21 NOTE — Progress Notes (Signed)
Discharge orders received. Pt educated on spinal precautions. Pt verbalized understanding. Pt's belongings packed. Report given to RN on 4W. Pt and belongings transported to 4W04 via bed.

## 2015-04-21 NOTE — Progress Notes (Signed)
Physical Therapy Treatment Patient Details Name: Selena Rodgers MRN: [id number] DOB: 25-Mar-1950 Today's Date: 04/21/2015    History of Present Illness Pt is a 65 y/o F who presented w/ quadraparesis whose MRI demonstrated severe spinal stenosis at C3-4 and C4-5. Pt is now s/p C3-4 and C4-5 anterior cervical discectomy/decompression fusion. Pt's PMH includes Rt ankle surgery, carpal tunnel release.    PT Comments    Selena Rodgers was very emotional today and required max encouragement and comforting as well as increased time w/ all mobility.  She currently requires max +2 assist for bed mobility and mod +2 assist for stand pivot transfers.  She presents w/ unsteady gait but ambulates 50 ft today w/ RW and min assist at times to steady. Pt very eager to go to CIR.   Follow Up Recommendations  CIR;Supervision for mobility/OOB     Equipment Recommendations  None recommended by PT    Recommendations for Other Services       Precautions / Restrictions Precautions Precautions: Fall;Cervical Required Braces or Orthoses: Cervical Brace Cervical Brace: Hard collar;Other (comment) (left note for MD to clarify when brace to be used) Restrictions Weight Bearing Restrictions: No    Mobility  Bed Mobility Overal bed mobility: Needs Assistance;+2 for physical assistance Bed Mobility: Rolling;Sidelying to Sit Rolling: Mod assist;+2 for physical assistance Sidelying to sit: Max assist;+2 for physical assistance       General bed mobility comments: Pt resists rolling due to Rt shoulder pain and severe muscle guarding through neck and back.  Max +2 assist to support trunk and manage Bil LEs to sit EOB.    Transfers Overall transfer level: Needs assistance Equipment used: Rolling walker (2 wheeled) Transfers: Sit to/from Stand Sit to Stand: Mod assist;+2 physical assistance;From elevated surface         General transfer comment: Use of bed pad and gait belt in place to assist w/ boost up  to standing.  Cues for hand placement. Pt is slow to stand and requires assist to keep RW on floor as pt pulling on it to straighten up.  Assist to control descent to sit in chair.  Ambulation/Gait Ambulation/Gait assistance: +2 safety/equipment;Min assist Ambulation Distance (Feet): 50 Feet Assistive device: Rolling walker (2 wheeled) Gait Pattern/deviations: Decreased stride length;Wide base of support;Staggering left;Staggering right;Antalgic;Trunk flexed   Gait velocity interpretation: <1.8 ft/sec, indicative of risk for recurrent falls General Gait Details: Pt demonstrates flexed posture and close chair follow for safety.  Min assist at times due to LOB when pt increases her gait speed.  Dec knee flexion during swing phase and circumduction noted w/ Rt LE.   Stairs            Wheelchair Mobility    Modified Rankin (Stroke Patients Only)       Balance Overall balance assessment: Needs assistance Sitting-balance support: Bilateral upper extremity supported Sitting balance-Leahy Scale: Poor Sitting balance - Comments: Relies on UE support and min guard>mod assist due to posterior lean Postural control: Posterior lean Standing balance support: Bilateral upper extremity supported;During functional activity Standing balance-Leahy Scale: Poor Standing balance comment: Relies on Bil UE support and outside assist for balance                    Cognition Arousal/Alertness: Awake/alert Behavior During Therapy: Anxious (tearful) Overall Cognitive Status: Impaired/Different from baseline                 General Comments: Pt alert and oriented x4.  Pt reports that  her cat came to visit her and was lying in bed w/ her this morning.  Aside from this comment pt appears cognitively WNL.    Exercises      General Comments General comments (skin integrity, edema, etc.): Pt reports not being able to feel her hands today, RN aware.  Pt able to use Bil UEs functionally w/  all mobility.  Pt was very emotional today and required max encouragement and comforting as well as increased time w/ all mobility.      Pertinent Vitals/Pain Pain Assessment: Faces Faces Pain Scale: Hurts even more Pain Location: neck and back w/ bed mobility Pain Descriptors / Indicators: Aching;Crying;Grimacing;Guarding Pain Intervention(s): Limited activity within patient's tolerance;Monitored during session;Repositioned    Home Living                      Prior Function            PT Goals (current goals can now be found in the care plan section) Acute Rehab PT Goals Patient Stated Goal: to regain her independence and to go home to her cat PT Goal Formulation: With patient/family Time For Goal Achievement: 05/04/15 Potential to Achieve Goals: Good Progress towards PT goals: Progressing toward goals    Frequency  Min 5X/week    PT Plan Current plan remains appropriate    Co-evaluation             End of Session Equipment Utilized During Treatment: Gait belt;Cervical collar Activity Tolerance: Patient limited by fatigue;Other (comment) (limited by anxiety ) Patient left: in chair;with call bell/phone within reach;with chair alarm set;with nursing/sitter in room     Time: 0911-0951 PT Time Calculation (min) (ACUTE ONLY): 40 min  Charges:  $Gait Training: 8-22 mins $Therapeutic Activity: 23-37 mins                    G Codes:      Collie Siad PT, DPT  Pager: [phone] Phone: [phone] 04/21/2015, 10:21 AM

## 2015-04-21 NOTE — Progress Notes (Signed)
Patient arrived onto [address]. Patient oriented to rehab and given information packet, and all questions answered. Discussed safety plan and agreement,and patient verbalized understanding. Will continue to monitor.Selena Rodgers

## 2015-04-21 NOTE — Progress Notes (Signed)
Selena Rodgers Rehab Admission Coordinator Signed Physical Medicine and Rehabilitation PMR Pre-admission 04/21/2015 11:58 AM  Related encounter: Admission (Current) from 04/19/2015 in [place] Collapse All   PMR Admission Coordinator Pre-Admission Assessment  Patient: Selena Rodgers is an 65 y.o., female MRN: [id number] DOB: 06-13-1950 Height: 5\' 7"  (170.2 cm) Weight: 124.739 kg (275 lb)  Insurance Information HMO: PPO: yes PCP: IPA: 80/20: OTHER:  PRIMARYJanit Bern Policy#: [id number] Subscriber: self CM Name: Nira Conn Phone#: [phone] ext [phone] Fax#: [phone] Pre-Cert#: [id number], auth received for IP Rehab admission with phone call follow up due 04/27/15 to [place] (phone [phone], ext [phone]) Employer: [employer] Benefits: Phone #: [phone] Name: Arsenio Katz. Date: 01/03/12 Deduct: $0 Out of Pocket Max: [phone] Life Max: n/a CIR: $500 copay then 80%/20% SNF: 80%/20% Outpatient: $20 copay  Home Health: 80% (Tier 3 benefits with [employer]) Co-Pay: 20% DME: 80% (Tier 3 benefits with [employer]) Co-Pay: 20% Providers: In network SECONDARY: Policy#: Subscriber:  CM Name: Phone#: Fax#:  Pre-Cert#: Employer:  Benefits: Phone #: Name:  Eff. Date: Deduct: Out of Pocket Max: Life Max:  CIR: SNF:  Outpatient: Co-Pay:  Home Health: Co-Pay:  DME: Co-Pay:   Medicaid Application Date: Case Manager:  Disability Application Date: Case Worker:   Emergency Contact Information Contact Information    Name Relation Home Work Mobile   Brandon E Sister [phone]  [phone]     Current Medical History  Patient Admitting Diagnosis: Cervical myelopathy History of Present Illness: Selena Rodgers is a 65 y.o. right handed female with progressive numbness and weakness in hands, quadriparesis and difficulty with ambulation. Patient lives alone in [place] works at [employer] as a Research scientist (physical sciences). She was using a walker prior to admission. One level home with a ramp. Her sister has been staying with her at night. Presented 04/19/2015 after recent MRI demonstrated severe spinal stenosis at C3-4 and C4-5. Underwent C3-4 and C4-5 anterior cervical discectomy decompression, C3-4 and C4-5 interbody arthrodesis 04/19/2015 per [name]. Hospital course pain management. Decadron protocol initiated. Aspen cervical collar applied. Physical and occupational therapy evaluations completed with recommendations of physical medicine rehabilitation consult. M.D. has requested physical medicine rehabilitation consult. Patient was admitted for a comprehensive rehabilitation program      Past Medical History  Past Medical History  Diagnosis Date  . Spinal stenosis   . Chronic back pain   . Arthritis   . Umbilical hernia   . Ventral hernia     Family History  family history includes Diabetes in her maternal grandfather, maternal grandmother, paternal grandfather, and paternal grandmother; Hypertension in her mother.  Prior Rehab/Hospitalizations:  Has the patient had major surgery during 100 days prior to admission? No  Current Medications   Current facility-administered medications:  . alum & mag hydroxide-simeth (MAALOX/MYLANTA) 200-200-20 MG/5ML suspension 30 mL, 30 mL, Oral, Q6H PRN, Newman Pies, MD . bisacodyl (DULCOLAX) suppository 10 mg, 10 mg, Rectal, Daily PRN, Newman Pies, MD . citalopram (CELEXA) tablet 20 mg, 20 mg, Oral, Daily, Newman Pies, MD, 20 mg at 04/21/15  1001 . cyclobenzaprine (FLEXERIL) tablet 10 mg, 10 mg, Oral, Q8H PRN, Newman Pies, MD, 10 mg at 04/20/15 2116 . docusate sodium (COLACE) capsule 100 mg, 100 mg, Oral, BID, Newman Pies, MD, 100 mg at 04/21/15 1001 . lactated ringers infusion, , Intravenous, Continuous, Annye Asa, MD, Last Rate: 50 mL/hr at 04/19/15 1037 . lactated ringers infusion, , Intravenous,  Continuous, Newman Pies, MD . menthol-cetylpyridinium (CEPACOL) lozenge 3 mg, 1 lozenge, Oral, PRN, 3 mg at 04/21/15 0450 **OR** phenol (CHLORASEPTIC) mouth spray 1 spray, 1 spray, Mouth/Throat, PRN, Newman Pies, MD . morphine 2 MG/ML injection 1-4 mg, 1-4 mg, Intravenous, Q3H PRN, Newman Pies, MD, 2 mg at 04/20/15 0518 . ondansetron (ZOFRAN) injection 4 mg, 4 mg, Intravenous, Q4H PRN, Newman Pies, MD . oxyCODONE (Oxy IR/ROXICODONE) immediate release tablet 5 mg, 5 mg, Oral, Q3H PRN, Newman Pies, MD, 5 mg at 04/21/15 1059 . pantoprazole (PROTONIX) EC tablet 80 mg, 80 mg, Oral, Daily, Newman Pies, MD, 80 mg at 04/21/15 1001  Patients Current Diet: Diet regular Room service appropriate?: Yes; Fluid consistency:: Thin Diet - low sodium heart healthy  Precautions / Restrictions Precautions Precautions: Fall, Cervical Precaution Comments: talked with pt about keeping neck straight Cervical Brace: Hard collar, Other (comment) (left note for MD to clarify when brace to be used) Restrictions Weight Bearing Restrictions: No   Has the patient had 2 or more falls or a fall with injury in the past year?Yes; 3-4 falls in the past year, usually falls posteriorly (feels like she is going backward at times)  Prior Activity Level Household: Since early March, pt. has essentially been homebound due to weakness and pain. Prior to this, she was working full time.   Home Assistive Devices / Equipment Home Assistive Devices/Equipment: Grab bars around toilet, Grab bars in shower, Crutches, Cane (specify  quad or straight), Raised toilet seat with rails, Shower chair with back, Shower chair without back, Environmental consultant (specify type), Wheelchair Home Equipment: Environmental consultant - 2 wheels, Bedside commode, Tub bench, Wheelchair - power, Environmental consultant - standard, Crutches, Adaptive equipment (lift chair; rails on bed; rollator?)  Prior Device Use: Indicate devices/aids used by the patient prior to current illness, exacerbation or injury? Walker  Prior Functional Level Prior Function Level of Independence: Needs assistance Gait / Transfers Assistance Needed: Over the past few weeks has had multiple episodes where she could not stand (after car rides). Otherwise pt was using RW. family has been assisting pt with transfers. ADL's / Homemaking Assistance Needed: Sister assisted with bathing, dressing recently. Has someone who cleans every two weeks. Does her own cooking. Comments: PTA pt was still working 40 hrs/wk until the past 2 wks  Self Care: Did the patient need help bathing, dressing, using the toilet or eating? Needed some help since early [place] Dilling, 2017; prior to this she was totally independent  Indoor Mobility: Did the patient need assistance with walking from room to room (with or without device)? Independent  Stairs: Did the patient need assistance with internal or external stairs n/a ramped entrance to home  Functional Cognition: Did the patient need help planning regular tasks such as shopping or remembering to take medications? Independent  Current Functional Level Cognition  Overall Cognitive Status: Impaired/Different from baseline Orientation Level: Oriented X4 General Comments: Pt alert and oriented x4. Pt reports that her cat came to visit her and was lying in bed w/ her this morning. Aside from this comment pt appears cognitively WNL.   Extremity Assessment (includes Sensation/Coordination)  Upper Extremity Assessment: RUE deficits/detail, LUE deficits/detail RUE Deficits / Details: less  than 90 degrees AROM shoulder flexion; pain in right shoulder RUE Sensation: decreased light touch RUE Coordination: decreased fine motor LUE Sensation: decreased light touch LUE Coordination: decreased fine motor  Lower Extremity Assessment: Defer to PT evaluation RLE Deficits / Details: strength grossly 5/5 w/ MMT; limited inversion and eversion of Rt  ankle due to h/o ankle surgery RLE Sensation: decreased light touch LLE Deficits / Details: strength grossly 5/5 w/ MMT LLE Sensation: decreased light touch    ADLs  Overall ADL's : Needs assistance/impaired Grooming: Minimal assistance, Sitting Grooming Details (indicate cue type and reason): wiped face with paper towel from crying with no difficulty Lower Body Dressing: Maximal assistance, Sit to/from stand (with +2 Mod assist for sit to stand transfer) Toilet Transfer: Moderate assistance, +2 for physical assistance, +2 for safety/equipment, RW, Ambulation (sit to stand from chair) Toilet Transfer Details (indicate cue type and reason): +2 Mod assist-sit to stand; +2 (for safety)Min guard for ambulation Functional mobility during ADLs: +2 for physical assistance, Moderate assistance, Rolling walker, +2 for safety/equipment (+2 Mod assist-sit to stand; +2 Min guard-ambulation) General ADL Comments: Donned pt's cervical collar-not on when OT went in room. Talked about AE briefly.     Mobility  Overal bed mobility: Needs Assistance, +2 for physical assistance Bed Mobility: Rolling, Sidelying to Sit Rolling: Mod assist, +2 for physical assistance Sidelying to sit: Max assist, +2 for physical assistance General bed mobility comments: Pt resists rolling due to Rt shoulder pain and severe muscle guarding through neck and back. Max +2 assist to support trunk and manage Bil LEs to sit EOB.     Transfers  Overall transfer level: Needs assistance Equipment used: Rolling walker (2 wheeled) Transfers: Sit to/from Stand Sit to Stand:  Mod assist, +2 physical assistance, From elevated surface General transfer comment: Use of bed pad and gait belt in place to assist w/ boost up to standing. Cues for hand placement. Pt is slow to stand and requires assist to keep RW on floor as pt pulling on it to straighten up. Assist to control descent to sit in chair.    Ambulation / Gait / Stairs / Wheelchair Mobility  Ambulation/Gait Ambulation/Gait assistance: +2 safety/equipment, Min assist Ambulation Distance (Feet): 50 Feet Assistive device: Rolling walker (2 wheeled) Gait Pattern/deviations: Decreased stride length, Wide base of support, Staggering left, Staggering right, Antalgic, Trunk flexed General Gait Details: Pt demonstrates flexed posture and close chair follow for safety. Min assist at times due to LOB when pt increases her gait speed. Dec knee flexion during swing phase and circumduction noted w/ Rt LE. Gait velocity interpretation: <1.8 ft/sec, indicative of risk for recurrent falls    Posture / Balance Dynamic Sitting Balance Sitting balance - Comments: Relies on UE support and min guard>mod assist due to posterior lean Balance Overall balance assessment: Needs assistance Sitting-balance support: Bilateral upper extremity supported Sitting balance-Leahy Scale: Poor Sitting balance - Comments: Relies on UE support and min guard>mod assist due to posterior lean Postural control: Posterior lean Standing balance support: Bilateral upper extremity supported, During functional activity Standing balance-Leahy Scale: Poor Standing balance comment: Relies on Bil UE support and outside assist for balance    Special needs/care consideration BiPAP/CPAP no CPM no Continuous Drip IV no Dialysis no  Life Vest no Oxygen no Special Bed no Trach Size no Wound Vac (area) no  Skin Anterior cervical surgical incision with bandage clean and dry  Bowel mgmt:  Last BM 04/18/15 continent Bladder mgmt: continent, using BSC with assist  Diabetic mgmt no     Previous Home Environment Living Arrangements: Alone Available Help at Discharge: (pt. has several family members who can assist intermittently) Type of Home: House Home Access: Ramped entrance Bathroom Shower/Tub: Tub/shower unit Bathroom Accessibility: Yes How Accessible: Accessible via walker Home Care Services: No Additional Comments: sister  has been staying w/ her at night, Aunt and mom check on her during the day  Discharge Living Setting Plans for Discharge Living Setting: Patient's home Type of Home at Discharge: House Discharge Home Layout: One level Discharge Home Access: [place] entrance Discharge Bathroom Shower/Tub: Tub/shower unit Discharge Bathroom Toilet: Standard (uses 3 n 1 over commode) Discharge Bathroom Accessibility: Yes How Accessible: Accessible via walker Does the patient have any problems obtaining your medications?: No  Social/Family/Support Systems Patient Roles: (pt. is close to her sister, brother and mother) Anticipated Caregiver: sister, Arna Medici Call  Anticipated Caregiver's Contact Information: [phone], cell; [phone] work (select option for office) Ability/Limitations of Caregiver: Sister and brother work day time hours but can assist as much as needed in the evenings. Pt's elderly mom can stay with pt. during the day but cannot provide physical assistance  Caregiver Availability: Other (Comment) (most of the time) Discharge Plan Discussed with Primary Caregiver: Yes Is Caregiver In Agreement with Plan?: Yes Does Caregiver/Family have Issues with Lodging/Transportation while Pt is in Rehab?: No   Goals/Additional Needs Patient/Family Goal for Rehab: supervision and minimal assist PT/OT Expected length of stay: 7-9 days Cultural Considerations: n/a Dietary Needs: regular diet, thin liquids Equipment Needs: TBA Additional  Information: Pt. had a bad experience on acute attempting to sit in recliner chair, stating it was vewry comfortable. We were able to locate a straight back chair for her and she is much more comfortable. I have requested nursing staff on CIR obtain a straightback chair for her use while on CIR. Pt/Family Agrees to Admission and willing to participate: Yes Program Orientation Provided & Reviewed with Pt/Caregiver Including Roles & Responsibilities: Yes   Decrease burden of Care through IP rehab admission: n/a   Possible need for SNF placement upon discharge: Not anticipated   Patient Condition: This patient's condition remains as documented in the consult dated 04/20/15 , in which the Rehabilitation Physician determined and documented that the patient's condition is appropriate for intensive rehabilitative care in an inpatient rehabilitation facility. Will admit to inpatient rehab today.  Preadmission Screen Completed By: Selena Rodgers, 04/21/2015 12:16 PM ______________________________________________________________________  Discussed status with Dr. Posey Pronto on 04/21/15 at 1216 and received telephone approval for admission today.  Admission Coordinator: Selena Rodgers, time 1216 Sudie Grumbling 04/21/15          Cosigned by: [name], MD at 04/21/2015 12:25 PM  Revision History     Date/Time User Provider Type Action   04/21/2015 12:25 PM [name], MD Physician Cosign   04/21/2015 12:17 PM Selena Rodgers Rehab Admission Coordinator Sign

## 2015-04-22 ENCOUNTER — Inpatient Hospital Stay (HOSPITAL_COMMUNITY): Payer: 59 | Admitting: Physical Therapy

## 2015-04-22 ENCOUNTER — Inpatient Hospital Stay (HOSPITAL_COMMUNITY): Payer: 59

## 2015-04-22 DIAGNOSIS — K5901 Slow transit constipation: Secondary | ICD-10-CM | POA: Insufficient documentation

## 2015-04-22 DIAGNOSIS — F4323 Adjustment disorder with mixed anxiety and depressed mood: Secondary | ICD-10-CM | POA: Insufficient documentation

## 2015-04-22 LAB — CBC WITH DIFFERENTIAL/PLATELET
BASOS ABS: 0 10*3/uL (ref 0.0–0.1)
BASOS PCT: 0 %
EOS PCT: 4 %
Eosinophils Absolute: 0.4 10*3/uL (ref 0.0–0.7)
HCT: 38 % (ref 36.0–46.0)
Hemoglobin: 12.2 g/dL (ref 12.0–15.0)
Lymphocytes Relative: 22 %
Lymphs Abs: 2 10*3/uL (ref 0.7–4.0)
MCH: 29 pg (ref 26.0–34.0)
MCHC: 32.1 g/dL (ref 30.0–36.0)
MCV: 90.5 fL (ref 78.0–100.0)
MONO ABS: 0.8 10*3/uL (ref 0.1–1.0)
Monocytes Relative: 8 %
Neutro Abs: 6 10*3/uL (ref 1.7–7.7)
Neutrophils Relative %: 66 %
PLATELETS: 201 10*3/uL (ref 150–400)
RBC: 4.2 MIL/uL (ref 3.87–5.11)
RDW: 13.6 % (ref 11.5–15.5)
WBC: 9.1 10*3/uL (ref 4.0–10.5)

## 2015-04-22 LAB — COMPREHENSIVE METABOLIC PANEL
ALBUMIN: 3.1 g/dL — AB (ref 3.5–5.0)
ALT: 13 U/L — ABNORMAL LOW (ref 14–54)
ANION GAP: 11 (ref 5–15)
AST: 19 U/L (ref 15–41)
Alkaline Phosphatase: 62 U/L (ref 38–126)
BUN: 11 mg/dL (ref 6–20)
CHLORIDE: 102 mmol/L (ref 101–111)
CO2: 26 mmol/L (ref 22–32)
Calcium: 9 mg/dL (ref 8.9–10.3)
Creatinine, Ser: 0.64 mg/dL (ref 0.44–1.00)
GFR calc Af Amer: >60 mL/min (ref >60)
Glucose, Bld: 131 mg/dL — ABNORMAL HIGH (ref 65–99)
POTASSIUM: 4 mmol/L (ref 3.5–5.1)
Sodium: 139 mmol/L (ref 135–145)
Total Bilirubin: 0.9 mg/dL (ref 0.3–1.2)
Total Protein: 6.6 g/dL (ref 6.5–8.1)

## 2015-04-22 MED ORDER — SENNOSIDES-DOCUSATE SODIUM 8.6-50 MG PO TABS
1.0000 | ORAL_TABLET | Freq: Every day | ORAL | Status: DC
  Administered 2015-04-22: 1 via ORAL
  Filled 2015-04-22: qty 1

## 2015-04-22 MED ORDER — POLYETHYLENE GLYCOL 3350 17 G PO PACK
17.0000 g | PACK | Freq: Every day | ORAL | Status: DC
  Administered 2015-04-22: 17 g via ORAL
  Filled 2015-04-22 (×2): qty 1

## 2015-04-22 NOTE — Progress Notes (Signed)
Physical Therapy Session Note  Patient Details  Name: Selena Rodgers MRN: [id number] Date of Birth: March 02, 1950  Today's Date: 04/22/2015 PT Individual Time: 8921-1941 AND 7408-1448 PT Individual Time Calculation (min): 45 min  AND 30.   Short Term Goals: Week 1:  PT Short Term Goal 1 (Week 1): Patient will perform bed mobilty with min A  PT Short Term Goal 2 (Week 1): Patient will transfer with min A and RW.  PT Short Term Goal 3 (Week 1): Patient will ambuate 151f with min A.   PT Short Term Goal 4 (Week 1): Patient will ascend/descend 4 stairs with min A and BUE support.  PT Short Term Goal 5 (Week 1): Patient will perform car transfer with min A at sedan height.   Skilled Therapeutic Interventions/Progress Updates:    Session 1.  Patient received sitting in WC. Patient performed Gait training in room for 159fwith RW. And min A From PT. PT [facility] 7580fith supervision A and 4 rest breaks. Patient transported to rehab gym. Gait of 69f61fth RW and min A from PT. Patient reported sudden urge to have Bowel movement. PT [facility] for time management. PT instructed patient in toilet transfer with mod-max A and constant cues for AD management and UE positioning. Throughout treatment patient performed sit<>stand x 8 with mod A and max A x2; PT provided constant instruction for LE placement, UE placement, and decreased pull on RW for sit>stand. Patient left sitting on toilet with NT aware of patients location.   Session 2:  Patient received sitting in WC and agreeable to PT. Patient instructed gait on uneven surface x 10ft84fh mod A. Patient performed stair training for 8, 4 inch steps with min-mod A, as 4, 6 inch steps with min-Mod A with BUE support on rails and moderate verbal and visual instruction for improved step to gait pattern and proper UE positioning. Patient attempted to pick objuect up from floor but was unable to do  so due to body habitus and anxiety from bending over. Patient returned to room and [facility] reach and all other needs met.     Therapy Documentation Precautions:  Precautions Precautions: Fall, Cervical Precaution Comments: instructed patient to keep collar on when OOB and when no staff present Required Braces or Orthoses: Cervical Brace Cervical Brace: Hard collar, Other (comment) Restrictions Weight Bearing Restrictions: No General:   Vital Signs: Therapy Vitals Temp: 99 F (37.2 C) Temp Source: Oral Pulse Rate: (!) 107 Resp: 18 BP: 132/73 mmHg Patient Position (if appropriate): Sitting Oxygen Therapy SpO2: 97 % O2 Device: Not Delivered Pain: Pain Assessment Pain Score: 3   See Function Navigator for Current Functional Status.   Therapy/Group: Individual Therapy  AustiLorie Phenix/2017, 5:42 PM

## 2015-04-22 NOTE — Evaluation (Signed)
Physical Therapy Assessment and Plan  Patient Details  Name: Selena Rodgers MRN: [id number] Date of Birth: January 10, 1950  PT Diagnosis: Abnormal posture, Abnormality of gait, Coordination disorder, Difficulty walking, Impaired sensation and Muscle weakness Rehab Potential: Good ELOS: 10-16 days    Today's Date: 04/22/2015 PT Individual Time: 1000-1115 PT Individual Time Calculation (min): 75 min    Problem List:  Patient Active Problem List   Diagnosis Date Noted  . Adjustment disorder with mixed anxiety and depressed mood   . Slow transit constipation   . Myelopathy ([place]) 04/21/2015  . Cervical myelopathy ([place])   . Constipation due to pain medication   . Depression   . Primary osteoarthritis of right shoulder   . S/P spinal surgery   . Abnormality of gait   . Weakness   . Other secondary hypertension   . Post-operative pain   . Arthritis   . Cervical spondylosis with myelopathy 04/19/2015  . 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 Ventral hernia 10/07/2014  . Current tobacco use 10/06/2014  . Anxiety 09/17/2014  . Back pain, chronic 09/17/2014  . Clinical depression 09/17/2014  . BP (high blood pressure) 09/17/2014  . Borderline diabetes 09/17/2014  . Arthritis, degenerative 09/17/2014  . Spinal stenosis 09/17/2014  . Compulsive tobacco user syndrome 09/17/2014  . Abdominal mass 06/30/2014  . Incisional hernia, without obstruction or gangrene 06/30/2014  . Morbid obesity ([place]) 06/30/2014  . Hypercholesteremia 05/23/2013  . 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 Hypercholesterolemia 05/23/2013    Past Medical History:  Past Medical History  Diagnosis Date  . Spinal stenosis   . Chronic back pain   . Arthritis   . Umbilical hernia   . Ventral hernia    Past Surgical History:  Past Surgical History  Procedure Laterality Date  . Cholecystectomy    . Breast biopsy    . Foot surgery Right   . Carpal tunnel release    . Umbilical hernia repair    .  Ventral hernia repair  12/26/2013    Dr. Pat Patrick  . Irrigation and debridement abdomen  01/10/2014    Dr. Marina Gravel  . Anterior cervical decomp/discectomy fusion N/A 04/19/2015    Procedure: ANTERIOR CERVICAL DECOMPRESSION/DISCECTOMY FUSION INTERBODY PROTHESIS PLATING BONEGRAFT CERVICAL THREE-FOUR ,CERVICAL FOUR-FIVE;  Surgeon: Newman Pies, MD;  Location: [place] [facility];  Service: Neurosurgery;  Laterality: N/A;    Assessment & Plan Clinical Impression: Patient is a 65 y.o. right handed female with progressive numbness and weakness in hands, quadriparesis and difficulty with ambulation. Patient lives alone in [place] works at [employer] as a Research scientist (physical sciences). She was using a walker prior to admission. One level home with a ramp. Her sister has been staying with her at night. Presented 04/19/2015 after recent MRI demonstrated severe spinal stenosis at C3-4 and C4-5. Underwent C3-4 and C4-5 anterior cervical discectomy decompression, C3-4 and C4-5 interbody arthrodesis 04/19/2015 per [name]. Hospital course pain management. Decadron protocol initiated. Aspen cervical collar applied.Patient transferred to CIR on 04/21/2015 .   Patient currently requires max with mobility secondary to muscle weakness, decreased cardiorespiratoy endurance, impaired timing and sequencing and unbalanced muscle activation and decreased sitting balance, decreased standing balance, decreased postural control, decreased balance strategies and difficulty maintaining precautions.  Prior to hospitalization, patient was modified independent  with mobility and lived with Alone in a House home.  Home access is  Ramped entrance.  Patient will benefit from skilled PT intervention to maximize safe functional mobility, minimize fall risk  and decrease caregiver burden for planned discharge home with intermittent assist.  Anticipate patient will benefit from follow up [place] at discharge.  PT - End of  Session Activity Tolerance: Tolerates 30+ min activity with multiple rests Endurance Deficit: Yes PT Assessment Rehab Potential (ACUTE/IP ONLY): Good Barriers to Discharge: Decreased caregiver support PT Patient demonstrates impairments in the following area(s): Balance;Motor;Pain;Safety;Sensory;Skin Integrity PT Transfers Functional Problem(s): Bed to Chair;Bed Mobility;Car;Furniture PT Locomotion Functional Problem(s): Ambulation;Wheelchair Mobility;Stairs PT Plan PT Intensity: Minimum of 1-2 x/day ,45 to 90 minutes PT Frequency: 5 out of 7 days PT Duration Estimated Length of Stay: 10-16 days  PT Treatment/Interventions: Ambulation/gait training;Balance/vestibular training;Cognitive remediation/compensation;Community reintegration;Discharge planning;Disease management/prevention;DME/adaptive equipment instruction;Functional electrical stimulation;Functional mobility training;Neuromuscular re-education;Pain management;Patient/family education;Psychosocial support;Skin care/wound management;Stair training;Splinting/orthotics;Therapeutic Activities;Therapeutic Exercise;UE/LE Strength taining/ROM;UE/LE Coordination activities;Visual/perceptual remediation/compensation;Wheelchair propulsion/positioning PT Transfers Anticipated Outcome(s): Mod I with LRAD PT Locomotion Anticipated Outcome(s): Mod I for house hold distances. supervision for community mobility.  PT Recommendation Follow Up Recommendations: Home health PT Patient destination: Home Equipment Recommended: To be determined Equipment Details: Pt has RW, Crutches,  and WC at home.   Skilled Therapeutic Intervention PT performed evaluation and treatment intervention initiated. See below for results. Patient performed bed mobility with max A for sit<>supine and mod A for Roll to L and R as tolerated. Patient performed gait training for 153f and 342fwith RW and min A from PT. PT instructed patient in car transfer to sedan height with Max  A and RW as well as cues for proper UE management. Patient performed WC mobility for 75 ft with BUE/BLE propulsion without increase in neck pain or increased numbness. Patient return to room following PT session and [facility].   PT Evaluation Precautions/Restrictions Precautions Precautions: Fall;Cervical Precaution Comments: instructed patient to keep collar on when OOB and when no staff present Required Braces or Orthoses: Cervical Brace Cervical Brace: Hard collar;Other (comment) Restrictions Weight Bearing Restrictions: No General   Vital Signs Pain Pain Assessment Pain Assessment: 0-10 Pain Score: 5  Pain Location: Hand Pain Orientation: Right;Left Pain Descriptors / Indicators: Heaviness Pain Onset: On-going 2nd Pain Site Pain Score: 5 Pain Location: Leg Pain Orientation: Right;Left Pain Descriptors / Indicators: Heaviness Patient's Stated Pain Goal: 3 Home Living/Prior Functioning Home Living Living Arrangements: Alone Type of Home: House Home Access: Ramped entrance Home Layout: One level Bathroom Shower/Tub: TuResearch officer, trade unionccessibility: Yes Additional Comments: sister has been staying w/ her at night, Aunt and mom check on her during the day  Lives With: Alone Prior Function Level of Independence: Independent with basic ADLs;Needs assistance with homemaking Laundry: Total (pays Aunt) Vacuuming: Maximal Gardening: Total Shopping: Minimal  Able to Take Stairs?: No Driving: Yes Vocation: Full time employment Vocation Requirements: desk Comments: PTA pt was still working 40 hrs/wk until the past 2 wks Vision/Perception     Cognition Overall Cognitive Status: Within Functional Limits for tasks assessed Arousal/Alertness: Awake/alert Attention: Alternating Memory: Appears intact Awareness: Appears intact Problem Solving: Appears intact Safety/Judgment: Appears intact Comments: some difficulty with safety due  to decreased senastion.  Sensation Sensation Light Touch: Impaired Detail Light Touch Impaired Details: Impaired RUE;Impaired LUE;Impaired RLE;Impaired LLE Hot/Cold: Appears Intact Proprioception: Impaired by gross assessment Additional Comments: Inconsistent paresthesias bilateral upper/lower extremities, distally.  Coordination Gross Motor Movements are Fluid and Coordinated: Yes Fine Motor Movements are Fluid and Coordinated: No Motor  Motor Motor: Other (comment) Motor - Skilled Clinical Observations: mild diskinesia in BLE due to poor sensation  Mobility Bed Mobility  Bed Mobility: Rolling Right;Rolling Left;Supine to Sit;Sit to Supine Rolling Right: 3: Mod assist Rolling Right Details: Verbal cues for sequencing;Verbal cues for technique;Verbal cues for precautions/safety;Tactile cues for placement;Tactile cues for posture;Tactile cues for weight shifting;Verbal cues for safe use of DME/AE;Manual facilitation for weight shifting Rolling Left: 3: Mod assist Rolling Left Details: Verbal cues for sequencing;Verbal cues for technique;Verbal cues for precautions/safety;Verbal cues for gait pattern;Verbal cues for safe use of DME/AE;Tactile cues for weight beaing Supine to Sit: 2: Max assist Supine to Sit Details: Verbal cues for sequencing;Verbal cues for technique;Verbal cues for precautions/safety;Verbal cues for safe use of DME/AE;Manual facilitation for weight shifting;Manual facilitation for placement;Manual facilitation for weight bearing Sit to Supine: 2: Max assist Sit to Supine - Details: Verbal cues for safe use of DME/AE;Verbal cues for precautions/safety;Verbal cues for technique;Manual facilitation for weight shifting;Manual facilitation for placement;Manual facilitation for weight bearing Transfers Transfers: Yes Locomotion  Ambulation Ambulation: Yes Ambulation/Gait Assistance: 4: Min guard Ambulation Distance (Feet): 100 Feet Assistive device: Rolling  walker Gait Gait: Yes Gait Pattern: Impaired Gait Pattern: Decreased step length - right;Decreased step length - left;Poor foot clearance - right;Wide base of support Stairs / Additional Locomotion Stairs: No Architect: Yes Wheelchair Assistance: 5: Investment banker, operational Details: Verbal cues for sequencing;Verbal cues for technique;Verbal cues for Information systems manager: Both upper extremities Wheelchair Parts Management: Needs assistance Distance: 19f  Trunk/Postural Assessment  Cervical Assessment Cervical Assessment: Exceptions to WRivers[facility] - D/P AphCervical AROM Overall Cervical AROM Comments: Aspen collar. no AROM.  Thoracic Assessment Thoracic Assessment: Exceptions to WNorthwest [facility]oscopy CenterThoracic AROM Overall Thoracic AROM Comments: mild kyphotic posture and mild scoliosis.  Lumbar Assessment Lumbar Assessment: Exceptions to WContra Costa Regional Medical CenterLumbar AROM Overall Lumbar AROM Comments: decreased lumbar lordosis.  Postural Control Postural Control: Deficits on evaluation Righting Reactions: deceased due to pain in hip and numbness in hands and BLE.   Balance Balance Balance Assessed: Yes Static Sitting Balance Static Sitting - Comment/# of Minutes: Supervision A from PT.  Dynamic Sitting Balance Sitting balance - Comments: Patient requires 1 UE for support in sitting with functional tasks.  Static Standing Balance Static Standing - Comment/# of Minutes: Able to maintain standing balance with BUE support on RW.  Dynamic Standing Balance Dynamic Standing - Comments: required Min A for support for balance with funcitonal tasks as well as RW.  Extremity Assessment  RUE Assessment RUE Assessment: Exceptions to WKindred Hospital WestminsterRUE AROM (degrees) Overall AROM Right Upper Extremity: Due to precautions LUE Assessment LUE Assessment: Exceptions to WFL LUE AROM (degrees) Overall AROM Left Upper Extremity: Due to precautions RLE Assessment RLE Assessment: Within  Functional Limits (4-/5 for hip flexion. all others 4+/5) LLE Assessment LLE Assessment: Within Functional Limits (4-/5 for hip flexion. all others 4+/5)   See Function Navigator for Current Functional Status.   Refer to Care Plan for Long Term Goals  Recommendations for other services: None  Discharge Criteria: Patient will be discharged from PT if patient refuses treatment 3 consecutive times without medical reason, if treatment goals not met, if there is a change in medical status, if patient makes no progress towards goals or if patient is discharged from hospital.  The above assessment, treatment plan, treatment alternatives and goals were discussed and mutually agreed upon: by patient  ALorie Phenix4/20/2017, 12:46 PM

## 2015-04-22 NOTE — Care Management Note (Signed)
[facility] Individual Statement of Services  Patient Name:  Selena Rodgers  Date:  04/22/2015  Welcome to the [facility].  Our goal is to provide you with an individualized program based on your diagnosis and situation, designed to meet your specific needs.  With this comprehensive rehabilitation program, you will be expected to participate in at least 3 hours of rehabilitation therapies Monday-Friday, with modified therapy programming on the weekends.  Your rehabilitation program will include the following services:  Physical Therapy (PT), Occupational Therapy (OT), 24 hour per day rehabilitation nursing, Therapeutic Recreaction (TR), Neuropsychology, Case Management (Social Worker), Rehabilitation Medicine, Nutrition Services and Pharmacy Services  Weekly team conferences will be held on Wednesday to discuss your progress.  Your Social Worker will talk with you frequently to get your input and to update you on team discussions.  Team conferences with you and your family in attendance may also be held.  Expected length of stay: 10-16 days  Overall anticipated outcome: supervision/mod/i level  Depending on your progress and recovery, your program may change. Your Social Worker will coordinate services and will keep you informed of any changes. Your Social Worker's name and contact numbers are listed  below.  The following services may also be recommended but are not provided by the [facility] [place] will be made to provide these services after discharge if needed.  Arrangements include referral to agencies that provide these services.  Your insurance has been verified to be:  [employer] Your primary doctor is:  Lisette Grinder  Pertinent information will be shared with your doctor and your insurance  company.  Social Worker:  Ovidio Kin, [place] or ([phone]  Information discussed with and copy given to patient by: Elease Hashimoto, 04/22/2015, 10:19 AM

## 2015-04-22 NOTE — IPOC Note (Signed)
Overall Plan of Care [facility]) Patient Details Name: Selena Rodgers MRN: [id number] DOB: 05-07-1950  Admitting Diagnosis: cervical myelopathy  Hospital Problems: Active Problems:   Spinal stenosis   Cervical myelopathy ([place])   Constipation due to pain medication   Depression   Primary osteoarthritis of right shoulder   S/P spinal surgery   Abnormality of gait   Weakness   Myelopathy ([facility])   Adjustment disorder with mixed anxiety and depressed mood   Slow transit constipation     Functional Problem List: Nursing Bowel, Medication Management, Pain, Safety, Skin Integrity  PT Balance, Motor, Pain, Safety, Sensory, Skin Integrity  OT Balance, Endurance, Safety, Behavior (High Anxiety; needs relief from [place] of collar with SUPERVISION during treatment)  SLP    TR         Basic ADL's: OT Eating, Grooming, Bathing, Dressing     Advanced  ADL's: OT Simple Meal Preparation     Transfers: PT Bed to Chair, Bed Mobility, Car, Chief Operating Officer: PT Ambulation, Emergency planning/management officer, Stairs     Additional Impairments: OT Fuctional Use of Upper Extremity (Impaired FMC bilateral hands d/t intermittent paresthesias)  SLP        TR      Anticipated Outcomes Item Anticipated Outcome  Self Feeding Mod I  Swallowing      Basic self-care  Min A  Toileting  Mod I   Bathroom Transfers Supervision  Bowel/Bladder  LBM 4/16 continent of bowel and bladder  Transfers  Mod I with LRAD  Locomotion  Mod I for house hold distances. supervision for community mobility.   Communication     Cognition     Pain  >4  Safety/Judgment  Supervision   Therapy Plan: PT Intensity: Minimum of 1-2 x/day ,45 to 90 minutes PT Frequency: 5 out of 7 days PT Duration Estimated Length of Stay: 10-16 days  OT Intensity: Minimum of 1-2 x/day, 45 to 90 minutes OT Frequency: 5 out of 7 days OT Duration/Estimated Length of Stay: 10-14 days         Team Interventions: Nursing  Interventions Patient/Family Education, Bowel Management, Pain Management, Medication Management, Skin Care/Wound Management  PT interventions Ambulation/gait training, Balance/vestibular training, Cognitive remediation/compensation, Community reintegration, Discharge planning, Disease management/prevention, DME/adaptive equipment instruction, Functional electrical stimulation, Functional mobility training, Neuromuscular re-education, Pain management, Patient/family education, Psychosocial support, Skin care/wound management, Stair training, Splinting/orthotics, Therapeutic Activities, Therapeutic Exercise, UE/LE Strength taining/ROM, UE/LE Coordination activities, Visual/perceptual remediation/compensation, Wheelchair propulsion/positioning  OT Interventions Training and development officer, Discharge planning, Therapeutic Exercise, Therapeutic Activities, Self Care/advanced ADL retraining, UE/LE Coordination activities, Patient/family education, UE/LE Strength taining/ROM, Functional mobility training  SLP Interventions    TR Interventions    SW/CM Interventions Discharge Planning, Psychosocial Support, Patient/Family Education    Team Discharge Planning: Destination: PT-Home ,OT- Home , SLP-  Projected Follow-up: PT-Home health PT, OT-  Home health OT, SLP-  Projected Equipment Needs: PT-To be determined, OT-  , SLP-  Equipment Details: PT-Pt has RW, Crutches,  and WC at home. , OT-Has all DME (w/c, RW, tub bench, BSC, ramp) Patient/family involved in discharge planning: PT- Patient,  OT-Patient, SLP-   MD ELOS: 12-15 days. Medical Rehab Prognosis:  Good Assessment: 65 y.o. right handed female with progressive numbness and weakness in hands, quadriparesis and difficulty with ambulation. Presented 04/19/2015 after recent MRI demonstrated severe spinal stenosis at C3-4 and C4-5. Underwent C3-4 and C4-5 anterior cervical discectomy decompression, C3-4 and C4-5 interbody arthrodesis 04/19/2015 per Dr.  Arnoldo Morale.  Hospital course , located by pain management. Aspen cervical collar applied.   See Team Conference Notes for weekly updates to the plan of care

## 2015-04-22 NOTE — Progress Notes (Signed)
Social Work  Social Work Assessment and Plan  Patient Details  Name: Selena Rodgers MRN: [id number] Date of Birth: 08/15/1950  Today's Date: 04/22/2015  Problem List:  Patient Active Problem List   Diagnosis Date Noted  . Adjustment disorder with mixed anxiety and depressed mood   . Slow transit constipation   . Myelopathy ([place]) 04/21/2015  . Cervical myelopathy ([place])   . Constipation due to pain medication   . Depression   . Primary osteoarthritis of right shoulder   . S/P spinal surgery   . Abnormality of gait   . Weakness   . Other secondary hypertension   . Post-operative pain   . Arthritis   . Cervical spondylosis with myelopathy 04/19/2015  . 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 Ventral hernia 10/07/2014  . Current tobacco use 10/06/2014  . Anxiety 09/17/2014  . Back pain, chronic 09/17/2014  . Clinical depression 09/17/2014  . BP (high blood pressure) 09/17/2014  . Borderline diabetes 09/17/2014  . Arthritis, degenerative 09/17/2014  . Spinal stenosis 09/17/2014  . Compulsive tobacco user syndrome 09/17/2014  . Abdominal mass 06/30/2014  . Incisional hernia, without obstruction or gangrene 06/30/2014  . Morbid obesity ([place]) 06/30/2014  . Hypercholesteremia 05/23/2013  . 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 Hypercholesterolemia 05/23/2013   Past Medical History:  Past Medical History  Diagnosis Date  . Spinal stenosis   . Chronic back pain   . Arthritis   . Umbilical hernia   . Ventral hernia    Past Surgical History:  Past Surgical History  Procedure Laterality Date  . Cholecystectomy    . Breast biopsy    . Foot surgery Right   . Carpal tunnel release    . Umbilical hernia repair    . Ventral hernia repair  12/26/2013    Dr. Pat Patrick  . Irrigation and debridement abdomen  01/10/2014    Dr. Marina Gravel  . Anterior cervical decomp/discectomy fusion N/A 04/19/2015    Procedure: ANTERIOR CERVICAL DECOMPRESSION/DISCECTOMY FUSION INTERBODY PROTHESIS  PLATING BONEGRAFT CERVICAL THREE-FOUR ,CERVICAL FOUR-FIVE;  Surgeon: Newman Pies, MD;  Location: [facility];  Service: Neurosurgery;  Laterality: N/A;   Social History:  reports that she quit smoking about 15 months ago. She has never used smokeless tobacco. She reports that she does not drink alcohol or use illicit drugs.  Family / Support Systems Marital Status: Single Patient Roles: Other (Comment) (employee) Other Supports: Margaret-sister  [phone]  [phone] Anticipated Caregiver: Mom, sister and Aunt Ability/Limitations of Caregiver: Sister works during the day, but has stayed at night prior to admission. Mom (1 yo) can provide supervision only Caregiver Availability: Other (Comment) (Mom during the day and sister at night) Family Dynamics: Close knit with sister, aunt and Mom.  Has brother who has placed bars up for pt. Between all of them they will make sure she has what she needs. She does need to be mobile since can not provide this amount of care.  Social History Preferred language: English Religion: Christian Cultural Background: No issues Education: CNA certified Read: Yes Write: Yes Employment Status: Employed Name of Employer: [employer] Length of Employment: 12 Return to Work Plans: Plans to return when able Freight forwarder Issues: No issues Guardian/Conservator: None-according to MD pt is capable of making her own decisions while here   Abuse/Neglect Physical Abuse: Denies Verbal Abuse: Denies Sexual Abuse: Denies Exploitation of patient/patient's resources: Denies Self-Neglect: Denies  Emotional Status Pt's affect, behavior adn adjustment status: Pt has had a rough  few months started at Christmas with hernia surgery. She tries to be positive but this hospitalization has not been good either, she has numerous compliants about the floor she came from and the care she received. Have referred her to office of pt experience. She is  reayd to work and get home soon. Recent Psychosocial Issues: other health issues-hernia surgery in 12/17 and now her neck Pyschiatric History: Pt reports a history of anxiety and now it is worse with this collar on. She does take medication which helps but may need more to get thorugh this. May benefit from seeing neuro-psych while here and this worker will provide suppoort also. Substance Abuse History: No issues  Patient / Family Perceptions, Expectations & Goals Pt/Family understanding of illness & functional limitations: Pt is able to explain her surgery and precautions. She does talk with the MD daily and feels he is listening to her concerns and addressing them. Important for her to be heard since feels she was not on acute unit. Premorbid pt/family roles/activities: Daughter, sister, niece, sibling, employee, etc Anticipated changes in roles/activities/participation: resume Pt/family expectations/goals: Pt states: " I need to be mobile no one can help me more, my Mom is 57 years old."  Sister states: " I hope she does well here."  [employer]: None Premorbid Home Care/DME Agencies: Other (Comment) (has DME from [employer]) Transportation available at discharge: family Resource referrals recommended: Neuropsychology, Support group (specify)  Discharge Planning Living Arrangements: Alone Support Systems: Other relatives, Friends/neighbors Type of Residence: Private residence Insurance Resources: Multimedia programmer (specify) Pharmacologist) Financial Resources: Employment Financial Screen Referred: Yes Living Expenses: Own Money Management: Patient Does the patient have any problems obtaining your medications?: No Home Management: Pt does her cooking but has hired someone to clean Patient/Family Preliminary Plans: Return home with sister staying at night and Mom coming in the daytime to be with her only, can not provide assist. Pt needs to be mod/i level before returning  home or will not be able too. pt wants to be given time to do what is asked of her, she feels too many times she is being rushed. Will remind team of this, pt also encouraged to be vocal in therapies. Social Work Anticipated Follow Up Needs: HH/OP, Support Group  Clinical Impression Pleasant somewhat needy female who wants to make sure her concerns are addressed and she is given the time she needs to perform the task asked of her. She has been through multiple issues since 12/16. She is doing the best that she can and would benefit from neuro-psych seeing while here. She has supportive famy who will assist her at home, but nes to be mod/i level prior to discharge. Will provide support while here also. Offered for her to talk with someone from Pt Experience office she will think about it. Await team's evaluations.  Elease Hashimoto 04/22/2015, 10:09 AM

## 2015-04-22 NOTE — Progress Notes (Addendum)
[place] PHYSICAL MEDICINE & REHABILITATION     PROGRESS NOTE  Subjective/Complaints:  Patient lying in bed this morning. She states she had some difficulty sleeping last night due to anxiety. She felt her collar was not allowing her to breathe. She is intermittently tearful on exam.  ROS: + Constipation. Denies CP, SOB, N/V/D  Objective: Vital Signs: Blood pressure 135/52, pulse 75, temperature 97.5 F (36.4 C), temperature source Oral, resp. rate 18, SpO2 97 %. No results found.  Recent Labs  04/22/15 0553  WBC 9.1  HGB 12.2  HCT 38.0  PLT 201    Recent Labs  04/22/15 0553  NA 139  K 4.0  CL 102  GLUCOSE 131*  BUN 11  CREATININE 0.64  CALCIUM 9.0   CBG (last 3)  No results for input(s): GLUCAP in the last 72 hours.  Wt Readings from Last 3 Encounters:  04/19/15 124.739 kg (275 lb)  04/13/15 124.739 kg (275 lb)  03/01/15 122.471 kg (270 lb)    Physical Exam:  BP 135/52 mmHg  Pulse 75  Temp(Src) 97.5 F (36.4 C) (Oral)  Resp 18  SpO2 97% Constitutional: She appears well-developed. Obese.  HENT: Normocephalic and atraumatic.  Eyes: Conjunctivae and EOM are normal.  Neck: Aspen cervical collar in place  Cardiovascular: Normal rate and regular rhythm.  Respiratory: Effort normal and breath sounds normal. No respiratory distress.  GI: Soft. Bowel sounds are normal. She exhibits no distension. + Ventral hernia  Musculoskeletal: She exhibits edema and tenderness.  Neurological: She is alert and oriented.  Sensation diminished to light touch in bilateral hands Motor: Bilateral upper extremities: Shoulder abduction 4/5 (RUE somewhat limited by pain), distally 4+/5 Bilateral lower extremities: Hip flexion: 4 -/5, knee extension 4+/5, ankle dorsi/plantarflexion 5/5  Skin: Skin is warm and dry.  Psychiatric: Her speech is normal. Judgment normal. Her affect is labile. Cognition and memory are normal. She exhibits a depressed mood  (improving).   Assessment/Plan: 1. Functional deficits secondary to severe cervical stenosis C3-4 and C4-5 status post anterior cervical discectomy decompression which require 3+ hours per day of interdisciplinary therapy in a comprehensive inpatient rehab setting. Physiatrist is providing close team supervision and 24 hour management of active medical problems listed below. Physiatrist and rehab team continue to assess barriers to discharge/monitor patient progress toward functional and medical goals.  Function:  Bathing Bathing position      Bathing parts      Bathing assist        Upper Body Dressing/Undressing Upper body dressing                    Upper body assist        Lower Body Dressing/Undressing Lower body dressing                                  Lower body assist        Toileting Toileting Toileting activity did not occur: Safety/medical concerns        Toileting assist     Transfers Chair/bed Clinical biochemist          Cognition Comprehension Comprehension assist level: Follows complex conversation/direction with extra time/assistive device  Expression Expression assist level: Expresses complex ideas: With extra time/assistive device  Technical sales engineer Social Interaction  assist level: Interacts appropriately with others with medication or extra time (anti-anxiety, antidepressant).  Problem Solving Problem solving assist level: Solves complex problems: With extra time  Memory Memory assist level: Complete Independence: No helper    Medical Problem List and Plan: 1. 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 Patient with Aspen cervical collar.  Begin CIR 2. DVT Prophylaxis/Anticoagulation: SCDs. Monitor for any signs of DVT 3. Pain Management: Oxycodone and  Robaxin as needed. Monitor with increased mobility 4. Mood: Celexa 20 mg daily  Will consider adding medication for anxiety if persistent 5. Neuropsych: This patient is capable of making decisions on her own behalf. 6. Skin/Wound Care: Routine skin checks 7. Fluids/Electrolytes/Nutrition: Routine I&O   BMP within normal limits on 4/20 8. Constipation. Laxative assistance  Will increase bowel regimen on 4/20 9. 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 10. HTN: Cont to monitor with increased activity. Treat if warranted.  11. Morbid obesity  There is no weight on file to calculate BMI., will obtain weight.  Diet and exercise education  Will cont to encourage weight loss to increase endurance and promote overall health   LOS (Days) 1 A FACE TO FACE EVALUATION WAS PERFORMED  Keyra Virella Lorie Phenix 04/22/2015 8:34 AM

## 2015-04-23 ENCOUNTER — Inpatient Hospital Stay (HOSPITAL_COMMUNITY): Payer: 59 | Admitting: Physical Therapy

## 2015-04-23 ENCOUNTER — Inpatient Hospital Stay (HOSPITAL_COMMUNITY): Payer: 59

## 2015-04-23 MED ORDER — ALPRAZOLAM 0.5 MG PO TABS: 0.5000 mg | ORAL_TABLET | Freq: Three times a day (TID) | ORAL | Status: DC | PRN

## 2015-04-23 MED ORDER — CITALOPRAM HYDROBROMIDE 20 MG PO TABS
40.0000 mg | ORAL_TABLET | Freq: Every day | ORAL | Status: DC
  Administered 2015-04-24 – 2015-04-28 (×5): 40 mg via ORAL
  Filled 2015-04-23 (×5): qty 2

## 2015-04-23 MED ORDER — HYDROCORTISONE 2.5 % RE CREA
TOPICAL_CREAM | Freq: Two times a day (BID) | RECTAL | Status: DC
  Administered 2015-04-23 – 2015-04-26 (×3): via RECTAL
  Filled 2015-04-23: qty 28.35

## 2015-04-23 NOTE — Progress Notes (Signed)
Occupational Therapy Session Note  Patient Details  Name: Selena Rodgers MRN: [id number] Date of Birth: January 24, 1950  Today's Date: 04/23/2015 OT Individual Time: [id number] OT Individual Time Calculation (min): 75 min   Short Term Goals: Week 1:  OT Short Term Goal 1 (Week 1): Pt will complete lower body bathing sitting and standing using AE prn with mod assist for thoroughness OT Short Term Goal 2 (Week 1): Pt will complete toilet transfer with min assist OT Short Term Goal 3 (Week 1): Pt will demo ability to complete HEP to improve [facility] of bilateral hands with supervision OT Short Term Goal 4 (Week 1): Pt will perform upper body bathing with supervision and setup OT Short Term Goal 5 (Week 1): Pt will complete upper body dressing wiht min assist  Skilled Therapeutic Interventions/Progress Updates: ADL-retraining at shower level with focus on transfers, safety awareness, toileting, standing balance, and adapted bathing/dressing skills.   Pt received seated in w/c awaiting therapist.   Pt requested immediate escort to toilet d/t ongoing diarrhea.   Pt able complete sit>stand and transfer to toilet with min lifting assist and min contact guard using RW, grab bar, and BSC over toilet.   Pt required max assist to toilet (hygiene and clothing management).   Pt then ambulated to shower chair to bathe sitting/standing with OT providing strict supervision and assist with hair care d/t cervical precautions.   Pt washed upper body and peri-area unassisted and required only min assist to wash buttocks thoroughly.   Pt returned to w/c at sink at end of session to groom after max  assist to don underwear, brief, gown, shoes and socks.   Pt deferred clothing d/t ongoing diarrhea.  Pt left in w/c at end of session to groom with only setup to provide supplies.     Therapy Documentation Precautions:  Precautions Precautions: Fall, Cervical Precaution Comments: instructed patient to keep collar on when OOB and  when no staff present Required Braces or Orthoses: Cervical Brace Cervical Brace: Hard collar, Other (comment) Restrictions Weight Bearing Restrictions: No  Pain: Pain Assessment Pain Assessment: No/denies pain   ADL: ADL ADL Comments: See Functional Assessment Tool  See Function Navigator for Current Functional Status.   Therapy/Group: Individual Therapy  Selena Rodgers 04/23/2015, 11:49 AM

## 2015-04-23 NOTE — Progress Notes (Signed)
[place] PHYSICAL MEDICINE & REHABILITATION     PROGRESS NOTE  Subjective/Complaints:  Patient seen lying in bed this morning. She is tearful. She is crying because she had 3 bowel movements overnight and is not sure if it is ever going to stop.  ROS: + Depression, anxiety. Denies CP, SOB, N/V/D  Objective: Vital Signs: Blood pressure 151/73, pulse 80, temperature 99.8 F (37.7 C), temperature source Oral, resp. rate 20, SpO2 96 %. No results found.  Recent Labs  04/22/15 0553  WBC 9.1  HGB 12.2  HCT 38.0  PLT 201    Recent Labs  04/22/15 0553  NA 139  K 4.0  CL 102  GLUCOSE 131*  BUN 11  CREATININE 0.64  CALCIUM 9.0   CBG (last 3)  No results for input(s): GLUCAP in the last 72 hours.  Wt Readings from Last 3 Encounters:  04/19/15 124.739 kg (275 lb)  04/13/15 124.739 kg (275 lb)  03/01/15 122.471 kg (270 lb)    Physical Exam:  BP 151/73 mmHg  Pulse 80  Temp(Src) 99.8 F (37.7 C) (Oral)  Resp 20  SpO2 96% Constitutional: She appears well-developed. Obese.  HENT: Normocephalic and atraumatic.  Eyes: Conjunctivae and EOM are normal.  Neck: Aspen cervical collar in place  Cardiovascular: Normal rate and regular rhythm.  Respiratory: Effort normal and breath sounds normal. No respiratory distress.  GI: Soft. Bowel sounds are normal. She exhibits no distension. + Ventral hernia  Musculoskeletal: She exhibits edema and tenderness.  Neurological: She is alert and oriented.  Sensation diminished to light touch in bilateral hands Motor: Bilateral upper extremities: Shoulder abduction 4/5 (RUE somewhat limited by pain), distally 4+/5 Bilateral lower extremities: Hip flexion: 4/5, knee extension 4+/5, ankle dorsi/plantarflexion 5/5  Skin: Skin is warm and dry.  Psychiatric: Her speech is normal. Judgment normal. Her affect is labile. Cognition and memory are normal. She exhibits a depressed mood (improving).   Assessment/Plan: 1. Functional deficits  secondary to severe cervical stenosis C3-4 and C4-5 status post anterior cervical discectomy decompression which require 3+ hours per day of interdisciplinary therapy in a comprehensive inpatient rehab setting. Physiatrist is providing close team supervision and 24 hour management of active medical problems listed below. Physiatrist and rehab team continue to assess barriers to discharge/monitor patient progress toward functional and medical goals.  Function:  Bathing Bathing position   Position: Wheelchair/chair at sink  Bathing parts Body parts bathed by patient: Right arm, Left arm, Chest, Abdomen Body parts bathed by helper: Front perineal area, Buttocks, Back  Bathing assist Assist Level:  (Moderate assist)      Upper Body Dressing/Undressing Upper body dressing   What is the patient wearing?: Hospital gown                Upper body assist Assist Level: Touching or steadying assistance(Pt > 75%)      Lower Body Dressing/Undressing Lower body dressing   What is the patient wearing?: Socks, Shoes               Socks - Performed by helper: Don/doff right sock, Don/doff left sock   Shoes - Performed by helper: Don/doff right shoe, Don/doff left shoe, Fasten right, Fasten left          Lower body assist Assist for lower body dressing: Touching or steadying assistance (Pt > 75%)      Toileting Toileting Toileting activity did not occur: Safety/medical concerns   Toileting steps completed by helper: Adjust clothing prior to toileting,  Performs perineal hygiene, Adjust clothing after toileting    Toileting assist Assist level:  (Total assist)   Transfers Chair/bed transfer   Chair/bed transfer method: Stand pivot Chair/bed transfer assist level: Maximal assist (Pt 25 - 49%/lift and lower) Chair/bed transfer assistive device: Armrests, Medical sales representative     Max distance: 100 Assist level: Touching or steadying assistance (Pt > 75%)    Wheelchair   Type: Manual Max wheelchair distance: 75 Assist Level: Supervision or verbal cues  Cognition Comprehension Comprehension assist level: Follows complex conversation/direction with no assist  Expression Expression assist level: Expresses complex ideas: With no assist  Social Interaction Social Interaction assist level: Interacts appropriately with others - No medications needed.  Problem Solving Problem solving assist level: Solves complex problems: Recognizes & self-corrects  Memory Memory assist level: Complete Independence: No helper    Medical Problem List and Plan: 1. 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 Patient with Aspen cervical collar.  Continue CIR 2. DVT Prophylaxis/Anticoagulation: SCDs. Monitor for any signs of DVT 3. Pain Management: Oxycodone and Robaxin as needed. Monitor with increased mobility 4. Mood: Celexa 20 mg daily, increased to 40 mg on 4/21  Xanax when necessary started on 4/21 5. Neuropsych: This patient is capable of making decisions on her own behalf. 6. Skin/Wound Care: Routine skin checks 7. Fluids/Electrolytes/Nutrition: Routine I&O   BMP within normal limits on 4/20 8. Constipation: Resolved   Laxative assistance 9. 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 10. HTN: Cont to monitor with increased activity. Treat if warranted.  11. Morbid obesity  There is no weight on file to calculate BMI., will obtain weight.  Diet and exercise education  Will cont to encourage weight loss to increase endurance and promote overall health  LOS (Days) 2 A FACE TO FACE EVALUATION WAS PERFORMED  Davontay Watlington Lorie Phenix 04/23/2015 8:43 AM

## 2015-04-23 NOTE — Progress Notes (Signed)
Orthopedic Tech Progress Note Patient Details:  Selena Rodgers 1950-08-03 [id number]  Ortho Devices Type of Ortho Device: Abdominal binder Ortho Device/Splint Location: abdomen Ortho Device/Splint Interventions: Loanne Drilling, Cadince Hilscher 04/23/2015, 11:57 AM

## 2015-04-23 NOTE — Progress Notes (Signed)
Physical Therapy Session Note  Patient Details  Name: Selena Rodgers MRN: [id number] Date of Birth: 04-Jun-1950  Today's Date: 04/23/2015 PT Individual Time: 0800-0900 AND 1301-1400 PT Individual Time Calculation (min): 60 min  AND 59 min  Short Term Goals: Week 1:  PT Short Term Goal 1 (Week 1): Patient will perform bed mobilty with min A  PT Short Term Goal 2 (Week 1): Patient will transfer with min A and RW.  PT Short Term Goal 3 (Week 1): Patient will ambuate 113f with min A.   PT Short Term Goal 4 (Week 1): Patient will ascend/descend 4 stairs with min A and BUE support.  PT Short Term Goal 5 (Week 1): Patient will perform car transfer with min A at sedan height.   Skilled Therapeutic Interventions/Progress Updates:     Session 1.  Patient received supine in bed in mild distress. Patient reports that she had a horrible night with multiple loose bowel movement in the bed. She also reports that "all the nurses are out to get me" and that "it would be better if I were dead". PT able to redirect patient to functional tasks including bed mobility. Patient required increased encouragement to perform PT on this day and to redirect away from perception of danger from Nursing. Patient instructed in Roll L, scooting R and R while supine in bed with mod A and constant cues for sequecing. Supine>sit transfer with max A and use of bed rail and cues for LE movement and UE positioning. Sit<>stand from elevated height with mod A x 2 and cues for improved UE positioning. Bed chair transfer with mod A. PT assisted patient to don shoes. WC mobility with BUE and BLE propulsion with close supervision A for 75 ft with 3 rest breaks. Gait training for 943fwith RW and min A from PT with 3 standing rest breaks. [facility] returned to [facility] and [facility] reach and all other needs met.    Session 2:   Patient received sitting in WC and agreeable to PT. WC mobility for [id number] close  supervision A and 3 rest breaks. Patient performed sit<>stand with mod A from PT and cues for weight shift. Chair bed transfer with mod A from PT and cues for controlled descent and UE placement. Sit>supine transfer With mod A and mod cues for technique and set up as well as to decrease speed to prevent neck injury. LE therex: SAQ x 12 BLE, SLR x 12 BLE, heel slides x 12 BLE, hip add/abd x 12 BLE.  Supine>sit with max A and constant cues for improved positioning and UE use. Incontenent episode of bowel with supine>sit transfer. [facility]. Gait in room to toilet and toilet transfer with min-Mod A. Patielt left on toilet to complete BM. RN notified.  Therapy Documentation Precautions:  Precautions Precautions: Fall, Cervical Precaution Comments: instructed patient to keep collar on when OOB and when no staff present Required Braces or Orthoses: Cervical Brace Cervical Brace: Hard collar, Other (comment) Restrictions Weight Bearing Restrictions: No General:   Vital Signs: Therapy Vitals Temp: 99.8 F (37.7 C) Temp Source: Oral Pulse Rate: 80 Resp: 20 BP: (!) 151/73 mmHg Patient Position (if appropriate): Lying Oxygen Therapy SpO2: 96 % O2 Device: Not Delivered   See Function Navigator for Current Functional Status.   Therapy/Group: Individual Therapy  AusLorie Phenix21/2017, 9:18 AM

## 2015-04-23 NOTE — Plan of Care (Signed)
Problem: SCI BOWEL ELIMINATION Goal: RH STG MANAGE BOWEL WITH ASSISTANCE STG Manage Bowel with minimal Assistance.  Outcome: Not Progressing incont of small soft formed stool

## 2015-04-24 ENCOUNTER — Inpatient Hospital Stay (HOSPITAL_COMMUNITY): Payer: 59

## 2015-04-24 NOTE — Progress Notes (Signed)
Patient ID: Selena Rodgers, female   DOB: [birth year], 65 y.o.   MRN: [id number]  [place] [facility]     PROGRESS NOTE  04/24/15.  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 No further complaints of diarrhea, but bothered by her cervical collar.  ROS: + Depression, anxiety. Denies CP, SOB, N/V/D  Past Medical History  Diagnosis Date  . Spinal stenosis   . Chronic back pain   . Arthritis   . Umbilical hernia   . Ventral hernia      Objective: Vital Signs: Blood pressure 143/57, pulse 78, temperature 98.4 F (36.9 C), temperature source Oral, resp. rate 17, SpO2 95 %. No results found.  Recent Labs  04/22/15 0553  WBC 9.1  HGB 12.2  HCT 38.0  PLT 201    Recent Labs  04/22/15 0553  NA 139  K 4.0  CL 102  GLUCOSE 131*  BUN 11  CREATININE 0.64  CALCIUM 9.0   BP Readings from Last 3 Encounters:  04/24/15 143/57  04/21/15 118/52  04/13/15 135/57     Wt Readings from Last 3 Encounters:  04/19/15 275 lb (124.739 kg)  04/13/15 275 lb (124.739 kg)  03/01/15 270 lb (122.471 kg)    Physical Exam:  BP 143/57 mmHg  Pulse 78  Temp(Src) 98.4 F (36.9 C) (Oral)  Resp 17  SpO2 95% Constitutional: She appears well-developed. Obese.  HENT: Normocephalic and atraumatic.  Eyes: Conjunctivae and EOM are normal.  Neck: Aspen cervical collar in place. Incision healing nicely with Steri-Strips in place Cardiovascular: Normal rate and regular rhythm.  Respiratory: Effort normal and breath sounds normal. No respiratory distress.  GI: Soft. Bowel sounds are normal. She exhibits no distension. + Ventral hernia  Musculoskeletal: She exhibits mild edema  Neurological: She is alert and oriented.  Sensation diminished to light touch in bilateral hands Motor:  Bilateral upper extremities: Shoulder abduction 4/5 (RUE somewhat limited by pain), distally 4+/5 Bilateral lower extremities: Hip flexion: 4/5, knee extension 4+/5, ankle dorsi/plantarflexion 5/5  Skin: Skin is warm and dry.  Psychiatric: Her speech is normal. Judgment normal.   Medical Problem List and Plan: 1. 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 Patient with Aspen cervical collar.  Continue CIR 2. DVT Prophylaxis/Anticoagulation: SCDs. Monitor for any signs of DVT 3. Pain Management: Oxycodone and Robaxin as needed. Monitor with increased mobility 4. Mood: Celexa 20 mg daily, increased to 40 mg on 4/21  Xanax when necessary started on 4/21  5. Constipation: Resolved   Laxative assistance only as needed 6. 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  Will give okay for use of home topical cream 7. HTN: Cont to monitor with increased activity. Treat if warranted.  8. Morbid obesity   LOS (Days) 3 A FACE TO FACE EVALUATION WAS PERFORMED  [name] 04/24/2015 8:41 AM

## 2015-04-24 NOTE — Progress Notes (Signed)
Physical Therapy Session Note  Patient Details  Name: Selena Rodgers MRN: [id number] Date of Birth: Sep 09, 1950  Today's Date: 04/24/2015 PT Individual Time: 1015-1100 PT Individual Time Calculation (min): 45 min   Short Term Goals: Week 1:  PT Short Term Goal 1 (Week 1): Patient will perform bed mobilty with min A  PT Short Term Goal 2 (Week 1): Patient will transfer with min A and RW.  PT Short Term Goal 3 (Week 1): Patient will ambuate 154ft with min A.   PT Short Term Goal 4 (Week 1): Patient will ascend/descend 4 stairs with min A and BUE support.  PT Short Term Goal 5 (Week 1): Patient will perform car transfer with min A at sedan height.   Skilled Therapeutic Interventions/Progress Updates:     No pain reported.  PT donned pt's socks and shoes for time management; pt sitting in w/c.  Gait with RW x 130', x 100'  on level tile, with 3 standing rest breaks, brief.  Sit> stand with extra time, min assist for first trial due to stiffness L hip due to OA. Last trial limited by L hip fatigue/pain.  Sit>< stand from firm mat at 26", 24", 22" with improvement in biomechanics; R hand fine motor activity in standing to remove and replace playing cards on vertical surface in front of her. Reciprocal scooting forward/backward in w/c for improved set-up for transfers.  neuromuscular re-education via demo, tactile cues for seated R/L hip flexion 10 x 2, heel raises 10 x 1. PT instructed pt in counting aloud to prevent holding breath.   Pt requested to propel w/c remainder of distance to room using bil feet.   Therapy Documentation Precautions:  Precautions Precautions: Fall, Cervical Precaution Comments: instructed patient to keep collar on when OOB and when no staff present Required Braces or Orthoses: Cervical Brace Cervical Brace: Hard collar, Other (comment) Restrictions Weight Bearing Restrictions: No   Pain:none at rest       See Function Navigator for Current Functional  Status.   Therapy/Group: Individual Therapy  [name] 04/24/2015, 12:22 PM

## 2015-04-25 ENCOUNTER — Inpatient Hospital Stay (HOSPITAL_COMMUNITY): Payer: 59 | Admitting: Physical Therapy

## 2015-04-25 ENCOUNTER — Inpatient Hospital Stay (HOSPITAL_COMMUNITY): Payer: 59 | Admitting: Occupational Therapy

## 2015-04-25 MED ORDER — NAPROXEN 250 MG PO TABS
375.0000 mg | ORAL_TABLET | Freq: Two times a day (BID) | ORAL | Status: DC
  Administered 2015-04-25 – 2015-04-28 (×7): 375 mg via ORAL
  Filled 2015-04-25 (×7): qty 2

## 2015-04-25 NOTE — Progress Notes (Addendum)
Patient and sister are asking questions about discharge date. Would like to speak with Dr. Posey Pronto and SW team tomorrow to clear up expectations for length of stay. Expressing wish to go home as soon as possible. Sister can be reached at [facility]

## 2015-04-25 NOTE — Progress Notes (Signed)
Patient ID: Selena Rodgers, female   DOB: [birth year], 65 y.o.   MRN: [id number]  Patient ID: Selena Rodgers, female   DOB: [birth year], 65 y.o.   MRN: [id number]  [place] [facility]     PROGRESS NOTE  04/25/15.   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 No further complaints of diarrhea, but bothered by her cervical collar.  Asking  again about resuming preadmission naproxen for arthritic pain.  Anxious for discharge  ROS: + Depression, anxiety. Denies CP, SOB, N/V/D  Past Medical History  Diagnosis Date  . Spinal stenosis   . Chronic back pain   . Arthritis   . Umbilical hernia   . Ventral hernia      Objective: Vital Signs: Blood pressure 134/52, pulse 77, temperature 98.5 F (36.9 C), temperature source Oral, resp. rate 18, SpO2 96 %. No results found. No results for input(s): WBC, HGB, HCT, PLT in the last 72 hours. No results for input(s): NA, K, CL, GLUCOSE, BUN, CREATININE, CALCIUM in the last 72 hours.  Invalid input(s): CO BP Readings from Last 3 Encounters:  04/25/15 134/52  04/21/15 118/52  04/13/15 135/57     Wt Readings from Last 3 Encounters:  04/19/15 275 lb (124.739 kg)  04/13/15 275 lb (124.739 kg)  03/01/15 270 lb (122.471 kg)    Physical Exam:  BP 134/52 mmHg  Pulse 77  Temp(Src) 98.5 F (36.9 C) (Oral)  Resp 18  SpO2 96% Constitutional: She appears well-developed. Obese.  HENT: Normocephalic and atraumatic.  Eyes: Conjunctivae and EOM are normal.  Neck: Aspen cervical collar in place. Incision healing nicely with Steri-Strips in place Cardiovascular: Normal rate and regular rhythm.  Respiratory: Effort normal and breath sounds normal. No respiratory distress.  GI: Soft. Bowel sounds are normal. She exhibits no distension. +  Ventral hernia  Musculoskeletal: She exhibits mild edema  Neurological: She is alert and oriented.  Sensation diminished to light touch in bilateral hands Motor: Bilateral upper extremities: Shoulder abduction 4/5 (RUE somewhat limited by pain), distally 4+/5 Bilateral lower extremities: Hip flexion: 4/5, knee extension 4+/5, ankle dorsi/plantarflexion 5/5  Skin: Skin is warm and dry.  Psychiatric: Her speech is normal. Judgment normal.   Medical Problem List and Plan: 1. 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 Patient with Aspen cervical collar.  Continue CIR 2. DVT Prophylaxis/Anticoagulation: SCDs. Monitor for any signs of DVT 3. Pain Management: Oxycodone and Robaxin as needed. Monitor with increased mobility 4. Mood: Celexa 20 mg daily, increased to 40 mg on 4/21  Xanax when necessary started on 4/21  5. Constipation: Resolved   Laxative assistance only as needed 6. 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  Will give okay for use of home topical cream.  Resume naproxen 7. HTN: Cont to monitor with increased activity. Treat if warranted.  8. Morbid obesity   LOS (Days) 4 A FACE TO FACE EVALUATION WAS PERFORMED  [name] 04/25/2015 8:42 AM

## 2015-04-25 NOTE — Progress Notes (Signed)
Occupational Therapy Session Note  Patient Details  Name: Selena Rodgers MRN: [id number] Date of Birth: [birth year]  Today's Date: 04/25/2015 OT Individual Time:  - 0900-1000  (60 min)  1st session                                         1300-1330  (30 min)  2nd session      Short Term Goals: Week 1:  OT Short Term Goal 1 (Week 1): Pt will complete lower body bathing sitting and standing using AE prn with mod assist for thoroughness OT Short Term Goal 2 (Week 1): Pt will complete toilet transfer with min assist OT Short Term Goal 3 (Week 1): Pt will demo ability to complete HEP to improve [facility] of bilateral hands with supervision OT Short Term Goal 4 (Week 1): Pt will perform upper body bathing with supervision and setup OT Short Term Goal 5 (Week 1): Pt will complete upper body dressing wiht min assist Week 2:     Skilled Therapeutic Interventions/Progress Updates:    . 1st session:   Pt engaged in [place] therapeutic activities.  Pt did speed stacke with right  hand for 20 sec and 19 sec with left.  Did peg place and jump for 2 minutes on right and 2 min 15 sec on left.  Propelled wc back to room and left with all needs in reach.       2nd session:  Engaged in fine motor activies using master mind.  Pt did lateral and 3 jaw chuck grasp with minimal dropping but increased time.  Activity provided brain challenge for problem solving and deductive reasoning.  Pt discussed anxiety in past few days about wanting to go home soon.  OT provided encouragement and reinforced how well she did with OT today.  .  Therapy Documentation Precautions:  Precautions Precautions: Fall, Cervical Precaution Comments: instructed patient to keep collar on when OOB and when no staff present Required Braces or Orthoses: Cervical Brace Cervical Brace: Hard collar, Other (comment) Restrictions Weight Bearing Restrictions: No    Vital Signs: Therapy Vitals Temp: 98.5 F (36.9 C) Temp Source: Oral Pulse Rate:  77 Resp: 18 BP: (!) 134/52 mmHg Patient Position (if appropriate): Lying Oxygen Therapy SpO2: 96 % O2 Device: Not Delivered Pain:  none   ADL: ADL ADL Comments: See Functional Assessment Tool   See Function Navigator for Current Functional Status.   Therapy/Group: Individual Therapy  Lisa Roca 04/25/2015, 7:52 AM

## 2015-04-25 NOTE — Progress Notes (Signed)
Physical Therapy Session Note  Patient Details  Name: Selena Rodgers MRN: [id number] Date of Birth: Jan 27, 1950  Today's Date: 04/25/2015 PT Individual Time: 1101-1203 and 1345-1429 PT Individual Time Calculation (min): 62 min and 44 minutes  Short Term Goals: Week 1:  PT Short Term Goal 1 (Week 1): Patient will perform bed mobilty with min A  PT Short Term Goal 2 (Week 1): Patient will transfer with min A and RW.  PT Short Term Goal 3 (Week 1): Patient will ambuate 176ft with min A.   PT Short Term Goal 4 (Week 1): Patient will ascend/descend 4 stairs with min A and BUE support.  PT Short Term Goal 5 (Week 1): Patient will perform car transfer with min A at sedan height.   Skilled Therapeutic Interventions/Progress Updates:    Treatment 1: Pt received in w/c & agreeable to PT. Pt reported no pain at rest but 2/10 arthritis pain in R shoulder & R hip with movement. PT & pt extensively discussed pt's PLOF, home set up, current level of function, and d/c plan. Pt reports she feels she is at her baseline level of function with all mobility. Pt has extensive DME at home and is used to modifying all activities in her home. Pt able to recall that she is not allowed to perform quick movements with head. PT educated pt on no cervical rotation, flexion or extension to facilitate healing. Pt transferred sit>stand with multiple trials with Min A 2/2 pt c/o R hip stiffness from sitting for prolonged amount of time. Pt able to ambulate 105 ft + 100 ft with RW & supervision. During gait pt demonstrates very minimal R knee flexion during swing phase and decreased step length. Pt reports she is ambulating at baseline level & is limited in RLE by hip arthritis. In gym pt transferred on to mat table & completed 2 trials of 5 sit<>stands from 21 inch surface, focusing on BLE strengthening. Pt able to complete activity with significantly extra time & supervision<>Mod A 2/2 fatigue as activity progressed. Pt then  ambulated 30 ft with RW before requiring seated rest break in w/c where PT transported pt back to room via total A. During session pt educated on need to wear shoes when ambulating at home due to reduced sensation in extremities. Also discussed cooking with patient, as she as reduced sensation in her BUE & pt reports she mainly uses microwave meals or her family will bring her prepared food. Pt also very fixated on d/c date & PT educated pt on interdisciplinary team meeting where we would discuss d/c date. Pt also became tearful at end of session, reporting she feels like she keeps facing obstacles & is ready to go home. PT provided therapeutic listening & emotional support, educating pt on need to maximize independence before d/c. At end of session pt left in w/c with all needs within reach.   Treatment 2: Pt received in w/c, noting 2/10 R hip arthritis pain & c/o fatigue, but agreeable to PT. Pt self propelled w/c with BUE & BLE from room>rehab apartment ~300 ft with supervision & extra time & rest breaks as needed. Pt reports she gets in bed on L side by standing & placing L knee on bed then laying down. PT educated pt to ambulate to bed, transfer to sitting>sidelying>supine to help maintain neck precautions; PT provided visual demonstration. Pt able to ambulate w/c>bed with RW & supervision & transfer to sitting. However, when attempting to transfer to sidelying pt lays  back on bed & is unable to transfer BLE onto bed even with multimodal cuing. Pt ambulated other side of bed & attempted to transfer into bed. Pt with increased ease & ability to transfer sit>supine on other side of bed as it is easier for her to transfer her RLE onto bed last. Pt then able to roll with supervision. Pt transferred to w/c & PT transported pt to ortho gym via total A for time management. Pt able to ambulate to car with RW & complete car transfer with supervision. Pt required extra time & intermittent use of BUE to transfer BLE  into car & PT educated pt not to hold on to door because it will move & does not provide a stable support to her. Pt then required 2 attempts to transfer sit>stand from car & cuing to push on seat of car to complete transfer. Pt then ambulated 100 ft back towards room before requiring a seated rest break. PT returned pt to room in w/c via total A & pt left with all needs within reach. Pt still very fixated on her performance affecting her ability to d/c as well as her d/c date.  Therapy Documentation Precautions:  Precautions Precautions: Fall, Cervical Precaution Comments: instructed patient to keep collar on when OOB and when no staff present Required Braces or Orthoses: Cervical Brace Cervical Brace: Hard collar, Other (comment) Restrictions Weight Bearing Restrictions: No  Pain: Pain Assessment Pain Assessment: 0-10 Pain Score: 2 with movement  Pain Type:  (arthritis pain) Pain Location:  (R shoulder & hip) Pain Intervention(s): Repositioned;Ambulation/increased activity   See Function Navigator for Current Functional Status.   Therapy/Group: Individual Therapy  Waunita Schooner 04/25/2015, 11:35 AM

## 2015-04-26 ENCOUNTER — Inpatient Hospital Stay (HOSPITAL_COMMUNITY): Payer: 59 | Admitting: Physical Therapy

## 2015-04-26 ENCOUNTER — Inpatient Hospital Stay (HOSPITAL_COMMUNITY): Payer: 59

## 2015-04-26 NOTE — Progress Notes (Signed)
[place] PHYSICAL MEDICINE & REHABILITATION     PROGRESS NOTE  Subjective/Complaints:  Patient sitting up in her chair. She states that she had a better weekend. She is still anxious to go home and has an atypical affect.  ROS: + Depression, anxiety. Denies CP, SOB, N/V/D  Objective: Vital Signs: Blood pressure 132/73, pulse 71, temperature 98.3 F (36.8 C), temperature source Oral, resp. rate 18, SpO2 96 %. No results found. No results for input(s): WBC, HGB, HCT, PLT in the last 72 hours. No results for input(s): NA, K, CL, GLUCOSE, BUN, CREATININE, CALCIUM in the last 72 hours.  Invalid input(s): CO CBG (last 3)  No results for input(s): GLUCAP in the last 72 hours.  Wt Readings from Last 3 Encounters:  04/19/15 124.739 kg (275 lb)  04/13/15 124.739 kg (275 lb)  03/01/15 122.471 kg (270 lb)    Physical Exam:  BP 132/73 mmHg  Pulse 71  Temp(Src) 98.3 F (36.8 C) (Oral)  Resp 18  SpO2 96% Constitutional: She appears well-developed. Obese.  HENT: Normocephalic and atraumatic.  Eyes: Conjunctivae and EOM are normal.  Neck: Aspen cervical collar in place  Cardiovascular: Normal rate and regular rhythm.  Respiratory: Effort normal and breath sounds normal. No respiratory distress.  GI: Soft. Bowel sounds are normal. She exhibits no distension. + Ventral hernia  Musculoskeletal: + Right fifth digit arthritis, right shoulder with pain and limited range of motion.  Neurological: She is alert and oriented.  Motor: Bilateral upper extremities: Shoulder abduction 4+/5 (RUE somewhat limited by pain), distally 5/5 Bilateral lower extremities: Hip flexion: 4/5, knee extension 4+/5, ankle dorsi/plantarflexion 5/5  Skin: Skin is warm and dry.  Psychiatric: Her speech is normal. Judgment normal. Her affect is labile. Cognition and memory are normal. She exhibits a depressed mood (improving).   Assessment/Plan: 1. Functional deficits secondary to severe cervical stenosis  C3-4 and C4-5 status post anterior cervical discectomy decompression which require 3+ hours per day of interdisciplinary therapy in a comprehensive inpatient rehab setting. Physiatrist is providing close team supervision and 24 hour management of active medical problems listed below. Physiatrist and rehab team continue to assess barriers to discharge/monitor patient progress toward functional and medical goals.  Function:  Bathing Bathing position   Position: Shower  Bathing parts Body parts bathed by patient: Right arm, Left arm, Chest, Abdomen, Front perineal area, Right upper leg, Left upper leg Body parts bathed by helper: Buttocks, Back  Bathing assist Assist Level:  (Moderate assist)      Upper Body Dressing/Undressing Upper body dressing   What is the patient wearing?: Hospital gown                Upper body assist Assist Level: Set up   Set up : To obtain clothing/put away  Lower Body Dressing/Undressing Lower body dressing   What is the patient wearing?: Underwear, Socks, Shoes               Socks - Performed by helper: Don/doff right sock, Don/doff left sock   Shoes - Performed by helper: Don/doff right shoe, Don/doff left shoe, Fasten right, Fasten left          Lower body assist Assist for lower body dressing:  (Max assist)      Toileting Toileting Toileting activity did not occur: Safety/medical concerns Toileting steps completed by patient: Adjust clothing prior to toileting, Performs perineal hygiene, Adjust clothing after toileting Toileting steps completed by helper: Performs perineal hygiene, Adjust clothing after toileting Toileting  Assistive Devices: Grab bar or rail  Toileting assist Assist level:  (Total assist)   Transfers Chair/bed transfer   Chair/bed transfer method: Ambulatory Chair/bed transfer assist level: Supervision or verbal cues Chair/bed transfer assistive device: Medical sales representative     Max distance: 100  ft Assist level: Supervision or verbal cues   Wheelchair   Type: Manual Max wheelchair distance: 351ft Assist Level: Supervision or verbal cues  Cognition Comprehension Comprehension assist level: Understands basic 90% of the time/cues < 10% of the time  Expression Expression assist level: Expresses complex 90% of the time/cues < 10% of the time  Social Interaction Social Interaction assist level: Interacts appropriately with others - No medications needed.  Problem Solving Problem solving assist level: Solves complex 90% of the time/cues < 10% of the time  Memory Memory assist level: Recognizes or recalls 90% of the time/requires cueing < 10% of the time    Medical Problem List and Plan: 1. 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 Patient with Aspen cervical collar.  Continue CIR 2. DVT Prophylaxis/Anticoagulation: SCDs. Monitor for any signs of DVT 3. Pain Management: Oxycodone and Robaxin as needed. Monitor with increased mobility 4. Mood: Celexa 20 mg daily, increased to 40 mg on 4/21  Xanax when necessary started on 4/21 5. Neuropsych: This patient is capable of making decisions on her own behalf. 6. Skin/Wound Care: Routine skin checks 7. Fluids/Electrolytes/Nutrition: Routine I&O   BMP within normal limits on 4/20 8. Constipation: Resolved   Laxative assistance 9. 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  Naproxen started on 4/23 10. HTN: Cont to monitor with increased activity. Treat if warranted.  11. Morbid obesity  There is no weight on file to calculate BMI., obtain weight, still pending.  Diet and exercise education  Will cont to encourage weight loss to increase endurance and promote overall health  LOS (Days) 5 A FACE TO FACE EVALUATION WAS PERFORMED  Espn Zeman Lorie Phenix 04/26/2015 8:44 AM

## 2015-04-26 NOTE — Plan of Care (Signed)
Problem: RH Balance Goal: LTG Patient will maintain dynamic standing balance (PT) LTG: Patient will maintain dynamic standing balance with assistance during mobility activities (PT)  Upgraded 2/2 pt progress  Problem: RH Ambulation Goal: LTG Patient will ambulate in controlled environment (PT) LTG: Patient will ambulate in a controlled environment, # of feet with assistance (PT).  Upgraded 2/2 pt progress  Problem: RH Stairs Goal: LTG Patient will ambulate up and down stairs w/assist (PT) LTG: Patient will ambulate up and down # of stairs with assistance (PT)  8 steps with B rails for LE strengthening

## 2015-04-26 NOTE — Progress Notes (Signed)
Physical Therapy Session Note  Patient Details  Name: Selena Rodgers MRN: [id number] Date of Birth: 1950-11-04  Today's Date: 04/26/2015 PT Individual Time: [id number] and 1303-1402 PT Individual Time Calculation (min): 44 min and 59 minutes  Short Term Goals: Week 1:  PT Short Term Goal 1 (Week 1): Patient will perform bed mobilty with min A  PT Short Term Goal 2 (Week 1): Patient will transfer with min A and RW.  PT Short Term Goal 3 (Week 1): Patient will ambuate 172ft with min A.   PT Short Term Goal 4 (Week 1): Patient will ascend/descend 4 stairs with min A and BUE support.  PT Short Term Goal 5 (Week 1): Patient will perform car transfer with min A at sedan height.   Skilled Therapeutic Interventions/Progress Updates:    Treatment 1: Pt received in bathroom with NT & agreeable to PT. Pt completed toileting tasks then handwashing at sink from w/c level. Reviewed pt's use of rollator with pt & pt reports she will occasionally lock brakes & ambulate in that way at home. PT educated pt that if she has needed to lock brakes to reduce speed of AD then she should use RW instead. Pt very hopeful to use rollator upon d/c & gait training completed x 85 ft + 205 ft with rollator & supervision A. Pt experienced 1 LOB due to self reported R foot drag but was able to self recover; pt presents with varus at ankles. Pt able to demonstrate proper use of locking brakes & sitting on device in hallway. When pt stood up from rollator to continue ambulating pt moved rollator before completing turn to face it.  Discussed negotiating ramp at home with pt; pt reported that she uses single crutch & hand rail to negotiate ramp so that she may leave 1 rollator in house and other in the car. PT discussed that pt would not be driving or leaving house without assistance upon d/c & pt reported she could have family member bring rollator from car so that she may use it to descend/ascend ramp & may still leave 2nd device in  house & pt agreeable to this. Pt demonstrated negotiation of ramp & was able to do so with supervision.  Pt then ambulated 100 ft with Rollator & supervision. During session pt reported she is doing much better compared to prior to admission. At end of session pt left in w/c & returning to room.   Treatment 2: Pt received in w/c & agreeable to PT, noting sharp/stabbing, arthritic pain in R hip when transferring sit<>stand but no pain any other time. Pt self propelled w/c 60 ft with BUE then ambulated 150 ft with Rollator & supervision. Pt negotiated 4 steps x 2 consecutive trials with B rails & supervision/steady A for BLE strengthening. Pt with decreased eccentric control when descending stairs 2/2 R hip pain. Gait training an additional 75 ft with rollator & supervision then educated PT on curb negotiation, to transfer front wheels of rollator on/off of step then transfer the back wheels. Pt able to successfully transfer rollator on to curb but required Mod A to transfer rollator off of curb as pt unable to reach far enough forward without losing balance to transfer rollator off of curb. Pt able to step up/down curb with Min A then negotiated ramp with supervision. Pt ambulated to rehab apartment where pt attempted to transfer in/out of bed with use of bed rail. Simulated home environment as much as possible with pt reporting  it was very similar. Pt able to transfer sit>supine with extra time & effort & supervision. To transfer supine>sit pt able to move legs off of bed but required Mod A to transfer trunk to upright sitting even with cues to push with LUE on bed rail & to push through R elbow. Pt continues to report she feels as if she will be able to do this when she gets home. Discussed with pt that bed mobility task was set up & per her report, simulates home environment well; educated pt that she requires more practice with task as well as strengthening. Pt very eager to go home soon & reports her sister is  planning to stay with her at nights; PT educated pt that sister could come in for family training on how to assist pt with bed mobility & pt plans to contact sister. Pt transferred back to room via w/c total A for time management & at end of session left with all needs within reach.   Therapy Documentation Precautions:  Precautions Precautions: Fall, Cervical Precaution Comments: instructed patient to keep collar on when OOB and when no staff present Required Braces or Orthoses: Cervical Brace Cervical Brace: Hard collar, Other (comment) Restrictions Weight Bearing Restrictions: No    Pain: Pain Assessment Pain Assessment: No/denies pain   See Function Navigator for Current Functional Status.   Therapy/Group: Individual Therapy  Waunita Schooner 04/26/2015, 10:43 AM

## 2015-04-26 NOTE — Progress Notes (Signed)
Occupational Therapy Session Note  Patient Details  Name: Selena Rodgers MRN: [id number] Date of Birth: [birth year]  Today's Date: 04/26/2015 OT Individual Time: 0730-0900 OT Individual Time Calculation (min): 90 min    Short Term Goals: Week 1:  OT Short Term Goal 1 (Week 1): Pt will complete lower body bathing sitting and standing using AE prn with mod assist for thoroughness OT Short Term Goal 2 (Week 1): Pt will complete toilet transfer with min assist OT Short Term Goal 3 (Week 1): Pt will demo ability to complete HEP to improve [facility] of bilateral hands with supervision OT Short Term Goal 4 (Week 1): Pt will perform upper body bathing with supervision and setup OT Short Term Goal 5 (Week 1): Pt will complete upper body dressing with min assist  Skilled Therapeutic Interventions/Progress Updates: ADL-retraining at shower level with focus on improved [facility] of bilateral hands during BADL, AE retraining and functional transfers.   Pt received seated in w/c and awaiting therapist for planned bathing with assist to wash her hair.   Pt able to ambulate with RW to gather her clothing and transfer to tub bench with only standby assist.   Pt required total assist for hair care but maintained neutral c-spine when Aspen collar was removed to replace pads.   OT demo'd use of abdominal binder at end of prolonged session to provide support to hernia however pt reported initial discomfort and requested removing binder.   Pt stated she was initially fearful of complications with hernia at admission to CIR but now feels more confident.   Pt stated she would nevertheless explore use of corsetts/girldes on her own s/p discharge.   Pt requires assist with lower body dressing however she continues to assert that with her previous adaptations at her home, she will resume her method of lower body dressing w/o assist.   Pt also reiterates that she is well-supported in her needs for assist with iADL by her family and she  has no reservations regarding her request for discharge earlier than planned.     Therapy Documentation Precautions:  Precautions Precautions: Fall, Cervical Precaution Comments: instructed patient to keep collar on when OOB and when no staff present Required Braces or Orthoses: Cervical Brace Cervical Brace: Hard collar, Other (comment) Restrictions Weight Bearing Restrictions: No   Pain: Pain Assessment Pain Assessment: No/denies pain Pain Score: 0-No pain  ADL: ADL ADL Comments: See Functional Assessment Tool  See Function Navigator for Current Functional Status.   Therapy/Group: Individual Therapy  [place]ie 04/26/2015, 12:56 PM

## 2015-04-27 ENCOUNTER — Inpatient Hospital Stay (HOSPITAL_COMMUNITY): Payer: 59

## 2015-04-27 ENCOUNTER — Inpatient Hospital Stay (HOSPITAL_COMMUNITY): Payer: 59 | Admitting: Physical Therapy

## 2015-04-27 NOTE — Progress Notes (Signed)
Social Work  Discharge Note  The overall goal for the admission was met for:   Discharge location: Yes-HOME WITH FAMILY ASSISTING-SUPERVISION ONLY FOR MOM  Length of Stay: Yes-7 DAYS  Discharge activity level: Yes-SUPERVISION/MOD/I LEVEL  Home/community participation: Yes  Services provided included: MD, RD, PT, OT, RN, CM, TR, Pharmacy and SW  Financial Services: Private Insurance: [employer]  Follow-up services arranged: Home Health: [place] [facility]-PT,OT,RN and Patient/Family request agency HH: [facility] [place], DME: NO NEEDS  Comments (or additional information):SISTER HERE FOR EDUCATION Tuesday AND BOTH FEEL [place]. MOM TO STAY DURING THE DAY AND SISTER AT NIGHT.  Patient/Family verbalized understanding of follow-up arrangements: Yes  Individual responsible for coordination of the follow-up plan: SELF & MARGARET-SISTER  Confirmed correct DME delivered: Elease Hashimoto 04/27/2015    Elease Hashimoto

## 2015-04-27 NOTE — Progress Notes (Signed)
Physical Therapy Session Note  Patient Details  Name: Selena Rodgers MRN: [id number] Date of Birth: Jun 20, 1950  Today's Date: 04/27/2015 PT Individual Time: 0803-0900 and 1500-1600 PT Individual Time Calculation (min): 57 min and 60 minutes  Short Term Goals: Week 1:  PT Short Term Goal 1 (Week 1): Patient will perform bed mobilty with min A  PT Short Term Goal 2 (Week 1): Patient will transfer with min A and RW.  PT Short Term Goal 3 (Week 1): Patient will ambuate 144ft with min A.   PT Short Term Goal 4 (Week 1): Patient will ascend/descend 4 stairs with min A and BUE support.  PT Short Term Goal 5 (Week 1): Patient will perform car transfer with min A at sedan height.   Skilled Therapeutic Interventions/Progress Updates:    Treatment 1: Pt received in w/c, sister Joycelyn Schmid present, & pt agreeable to PT noting no pain at rest, only arthritic pain in R hip during transfers. Pt's sister, Joycelyn Schmid, reports she plans to stay with pt at night upon d/c & pt's mother & aunt will provide intermittent assistance throughout day. Gait training x 280 ft with rollator & multiple standing rest breaks to rehab apartment. Pt performed bed mobility by entering L side of bed with use of bed rail & pt transferring sit>supine with supervision, and requiring min A for supine>sit with sister providing manual facilitation at pt's R shoulder. Pt & sister both report comfort with performing task. Pt practiced bed mobility (supine<>sit & rolling L<>R) x 1 additional time for strengthening to increase ease of task. Pt requesting to be checked off to go to bathroom on her own in room. Gait training x 180 ft back to room with rollator & supervision fade to mod I. In room pt demonstrated w/c propulsion room>bathroom, proper positioning of w/c & ability to transfer w/c<>commode via stand pivot with use of grab bar. Pt exited bathroom in w/c with uncontrolled propulsion; PT provided cuing for safe exit from bathroom in w/c & pt  able to demonstrate this again. Pt also requested to propel w/c into bathroom & stand for a minute or 2 to help relieve stiffness throughout the day. PT checked off pt for w/c propulsion into bathroom, stand pivot w/<>toilet with grab bar & to stand at grab bar; RN notified. At end of session pt left in w/c with sister present.  Treatment 2: Pt received in w/c & agreeable to PT. Session focused on ambulating in home & community environment for gait & endurance training. Pt able to self propel w/c from room>gym with Mod I & use of BUE & BLE for cardiovascular endurance training. Pt transferred to standing with Mod I & ambulated gym>gift shop with seated rest break in main lobby of hospital. During ambulation pt experienced 1 LOB but was able to self recover without assistance & pt reporting floor was uneven. Pt able to ambulate throughout gift shop & properly lock rollator & transfer sit<>stand on AD as needed for rest breaks. Pt required a seated rest break before ambulating back up to unit. On unit pt able to ambulate around apartment with rollator & mod I without LOB. Pt reports she feels comfortable with d/c home tomorrow with assistance from family. Pt required significantly extra time to ambulate during session today, as she required frequent rest breaks 2/2 R hip arthritis pain & overall fatigue. At end of session pt left in w/c in room with all needs within reach.  Therapy Documentation Precautions:  Precautions Precautions:  Fall, Cervical Precaution Comments: instructed patient to keep collar on when OOB and when no staff present Required Braces or Orthoses: Cervical Brace Cervical Brace: Hard collar, Other (comment) Restrictions Weight Bearing Restrictions: No Pain: Pain Assessment Pain Assessment: No/denies pain (denies pain at rest)   See Function Navigator for Current Functional Status.   Therapy/Group: Individual Therapy  Waunita Schooner 04/27/2015, 8:54 AM

## 2015-04-27 NOTE — Plan of Care (Signed)
Problem: RH PAIN MANAGEMENT Goal: RH STG PAIN MANAGED AT OR BELOW PT'S PAIN GOAL >4  Outcome: Completed/Met Date Met:  04/27/15 No c/o pain.

## 2015-04-27 NOTE — Discharge Summary (Signed)
Selena Rodgers, Selena Rodgers             ACCOUNT NO.:  [id number]  MEDICAL RECORD NO.:  [id number]  LOCATION:  [id number]                        FACILITY:  [facility]  PHYSICIAN:  Delice Lesch, MD        DATE OF BIRTH:  February 01, 1950  DATE OF ADMISSION:  04/20/2015 DATE OF DISCHARGE:  04/28/2015                              DISCHARGE SUMMARY   DISCHARGE DIAGNOSES: 1. Cervical myelopathy with quadriparesis secondary to severe cervical     stenosis status post anterior cervical diskectomy and     decompression. 2. SCDs for DVT prophylaxis. 3. Pain management. 4. Depression and anxiety. 5. Constipation, resolved. 6. 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 7. Hypertension. 8. Morbid obesity.  HISTORY OF PRESENT ILLNESS:  This is a 65 year old right-handed female, with progressive numbness and weakness of the hands, quadriparesis, difficulty with ambulation.  Lives alone in [place].  She worked at [employer] as a Research scientist (physical sciences).  Used a walker prior to admission.  She has a sister, who stays with her at night.  Presented on April 19, 2015, after recent MRI demonstrated severe spinal stenosis C3-4 and C4-5.  Underwent C3-4 and C4-5 anterior cervical diskectomy, decompression with arthrodesis on April 19, 2015, per [name]. Hospital course, pain management.  Decadron protocol.  Aspen collar applied.  Physical and occupational therapy ongoing.  The patient was admitted for a comprehensive rehab program.  PAST MEDICAL HISTORY:  See discharge diagnoses.  SOCIAL HISTORY:  Lives alone.  She has a sister, who helps at night. Used a walker prior to admission.  Functional status upon admission to rehab services was minimal guard 15 feet rolling walker; moderate assist, sit to stand; min to mod assist for activities of daily living.  PHYSICAL EXAMINATION:  VITAL SIGNS:  Blood pressure 153/65, pulse 90, temperature 99, respirations 18. GENERAL:  This was  an alert female, somewhat tearful, oriented x3. LUNGS:  Clear to auscultation without wheeze. CARDIAC:  Regular rate and rhythm without murmur. ABDOMEN:  Soft, nontender.  Good bowel sounds.  Aspen cervical collar in place.  Surgical site clean and dry.  REHABILITATION HOSPITAL COURSE:  The patient was admitted to inpatient rehab services with therapies initiated on a 3-hour daily basis, consisting of physical therapy, occupational therapy, and rehabilitation nursing.  The following issues were addressed during the patient's rehabilitation stay.  Pertaining to Ms. Mcvicker cervical stenosis and myelopathy, she had undergone anterior cervical diskectomy, decompression on April 19, 2015.  Aspen collar as directed.  She would follow up with Neurosurgery, Dr. Newman Pies.  SCDs for DVT prophylaxis.  No signs of DVT.  Pain management with use of oxycodone and Robaxin with good results as well as the addition of Naprosyn twice daily.  Noted history of depression and anxiety, maintained on Celexa, had been increased to 40 mg daily as well as Xanax as needed.  Blood pressures remained well controlled.  Bouts of constipation resolved with laxative assistance.  Noted morbid obesity with a body mass index of 42.51, diet and exercise education provided.  The patient received weekly collaborative interdisciplinary team conferences to discuss estimated length of stay, family teaching, and any barriers to discharge.  The  patient completed toileting tasks, hand washing at the sink, ambulated 200 feet with rolling walker, supervision.  She was able to demonstrate proper use of locking her brakes in sitting on the device in the hallway.  Stood up from [employer] to continue ambulating. Simulated home environment as much as possible with patient reporting it was very similar.  Able to transfer sit to supine with extra time and supervision.  She could gather her belongings for activities of daily living  and homemaking.  Required some assist for lower body dressing. Full family teaching was completed and plan discharge to home.  DISCHARGE MEDICATIONS: 1. Xanax 0.5 mg p.o. t.i.d. as needed. 2. Celexa 40 mg p.o. daily. 3. Naprosyn 375 mg p.o. b.i.d. with meals. 4. Oxycodone immediate release 5 mg every 3 hours as needed moderate     pain, dispense of 90 tablets.  DIET:  Regular.  Aspen collar at all times.  The patient to follow up with Dr. Newman Pies, Neurosurgery, 2 weeks call for appointment; Dr. Delice Lesch as needed; Dr. Lisette Grinder, medical management.     Lauraine Rinne, P.A.   ______________________________ Delice Lesch, MD    DA/MEDQ  D:  04/27/2015  T:  04/27/2015  Job:  [id number]  cc:   Ophelia Charter, M.D. Georgetta Haber, Dr.

## 2015-04-27 NOTE — Progress Notes (Signed)
Occupational Therapy Session Note  Patient Details  Name: Selena Rodgers MRN: [id number] Date of Birth: [birth year]  Today's Date: 04/27/2015 OT Individual Time: 0945-1100 OT Individual Time Calculation (min): 75 min    Short Term Goals: Week 1:  OT Short Term Goal 1 (Week 1): Pt will complete lower body bathing sitting and standing using AE prn with mod assist for thoroughness OT Short Term Goal 2 (Week 1): Pt will complete toilet transfer with min assist OT Short Term Goal 3 (Week 1): Pt will demo ability to complete HEP to improve [facility] of bilateral hands with supervision OT Short Term Goal 4 (Week 1): Pt will perform upper body bathing with supervision and setup OT Short Term Goal 5 (Week 1): Pt will complete upper body dressing with min assist  Skilled Therapeutic Interventions/Progress Updates: Therapeutic activity with focus on sit<>stand, hand grip and pinch strengthening, and improved [place] using theraputty and therapeutic activities with household items; written exercises provided.     Grip per Jamar hand dyno:  R = 47 lbs L= 22 lbs Pinch:   Key: R= 13 L=8, Tip: R=8 lbs L= 6, Palmar: 8 lbs bilaterally.  Pt educated on use of theraputty and elastic bands (for extension of digits and thumb).  Pt completed hand exercises with supervision to isolate thumb IP flexion and abduction during pinch strengthening.   Pt reports gradual improvement in sensation at fingers although with continued intermittent paresthesias limiting light touch.     Therapy Documentation Precautions:  Precautions Precautions: Fall, Cervical Precaution Comments: instructed patient to keep collar on when OOB and when no staff present Required Braces or Orthoses: Cervical Brace Cervical Brace: Hard collar, Other (comment) Restrictions Weight Bearing Restrictions: No   Pain: Pain Assessment Pain Assessment: No/denies pain (denies pain at rest)   ADL: ADL ADL Comments: See Functional Assessment Tool    Exercises: Hand Exercises Digit Composite Flexion: Strengthening;Hand exerciser Composite Extension: Hand exerciser Digit Composite Abduction: Hand exerciser Digit Composite Adduction: Hand exerciser Thumb Abduction: Hand exerciser Thumb Adduction: Hand exerciser Opposition: Hand exerciser  See Function Navigator for Current Functional Status.   Therapy/Group: Individual Therapy  [place] 04/27/2015, 12:49 PM

## 2015-04-27 NOTE — Patient Care Conference (Signed)
Inpatient RehabilitationTeam Conference and Plan of Care Update Date: 04/28/2015   Time: 9:43 AM    Patient Name: Selena Rodgers      Medical Record Number: [id number]  Date of Birth: 12-22-1950 Sex: Female         Room/Bed: 4W04C/4W04C-01 Payor Info: Payor: [employer]OY[employer] / Plan: [employer] / Product Type: *No Product type* /    Admitting Diagnosis: cervical myelopathy  Admit Date/Time:  04/21/2015  4:59 PM Admission Comments: No comment available   Primary Diagnosis:  <principal problem not specified> Principal Problem: <principal problem not specified>  Patient Active Problem List   Diagnosis Date Noted  . Adjustment disorder with mixed anxiety and depressed mood   . Slow transit constipation   . Myelopathy ([place]) 04/21/2015  . Cervical myelopathy ([place])   . Constipation due to pain medication   . Depression   . Primary osteoarthritis of right shoulder   . S/P spinal surgery   . Abnormality of gait   . Weakness   . Other secondary hypertension   . Post-operative pain   . Arthritis   . Cervical spondylosis with myelopathy 04/19/2015  . 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 Ventral hernia 10/07/2014  . Current tobacco use 10/06/2014  . Anxiety 09/17/2014  . Back pain, chronic 09/17/2014  . Clinical depression 09/17/2014  . BP (high blood pressure) 09/17/2014  . Borderline diabetes 09/17/2014  . Arthritis, degenerative 09/17/2014  . Spinal stenosis 09/17/2014  . Compulsive tobacco user syndrome 09/17/2014  . Abdominal mass 06/30/2014  . Incisional hernia, without obstruction or gangrene 06/30/2014  . Morbid obesity ([place]) 06/30/2014  . Hypercholesteremia 05/23/2013  . 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 Hypercholesterolemia 05/23/2013    Expected Discharge Date: Expected Discharge Date: 04/28/15  Team Members Present: Physician leading conference: Dr. 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 Patient with Aspen cervical collar  Improve function, mobility, transfers  See above   Bowel/Bladder   Continent of bowel and bladder. LBM  04/25/15  Pt to remain continent of bowel and bladder  Monitor   Swallow/Nutrition/ Hydration     na        ADL's   Min Assist for B & D (lower body) , mod I for toileting and sitting balance, setup to replace [place] pads  Supervision-Min Assist for BADL and homemaking  Transfers, adapted bathing/dressing, functional mobility and endurance, FMC of bil hands, emotional support   Mobility   supervision progressing to Mod I with ambulation, mod I for transfers, supervision for standing balance, Min A bed mobility  Mod I standing balance, Min A bed mobility, Mod I ambulation  BLE strengthening & endurance training, balance training   Communication     na        Safety/Cognition/ Behavioral Observations    no unsafe behaviors        Pain   No c/o pain  <3  Monitor for nonverbal cues of pain   Skin   R ACDF incision w/steri, CDI, unremarkable  No additional skin breakdown  Assess q shift      *See Care Plan  and progress notes for long and short-term goals.  Barriers to Discharge: Anxiety, depression, safety    Possible Resolutions to Barriers:  Therapies, adjust medications    Discharge Planning/Teaching Needs:    Home with sister, brother and Mom checking in on and sister staying the night with. Needs to be mod/i level to go home, Mom is 63 yo and can not provide physical assist     Team Discussion:  Pt has reached her goals of supervision/mod/i level. Pain is  managed and pt will do what she needs to do at home, has for a long time. Family education completed with sister Tuesday. Ready for DC today  Revisions to Treatment Plan:  None   Continued Need for Acute Rehabilitation Level of Care: The patient requires daily medical management by a physician with specialized training in physical medicine and rehabilitation for the following conditions: Daily direction of a multidisciplinary physical rehabilitation program to ensure safe treatment while eliciting the highest outcome that is of practical value to the patient.: Yes Daily medical management of patient stability for increased activity during participation in an intensive rehabilitation regime.: Yes Daily analysis of laboratory values and/or radiology reports with any subsequent need for medication adjustment of medical intervention for : Post surgical problems;Neurological problems;Mood/behavior problems  Elease Hashimoto 04/28/2015, 9:43 AM

## 2015-04-27 NOTE — Progress Notes (Signed)
Social Work Patient ID: Selena Rodgers, female   DOB: 04-21-1950, 65 y.o.   MRN: [id number] MD feels medically stable for discharge tomorrow informed pt and will make transportation arrangements.

## 2015-04-27 NOTE — Plan of Care (Signed)
Problem: RH Bed Mobility Goal: LTG Patient will perform bed mobility with assist (PT) LTG: Patient will perform bed mobility with assistance, with/without cues (PT).  Downgrade 2/2 slow progress; may use bed rail

## 2015-04-27 NOTE — Progress Notes (Signed)
Social Work Patient ID: Selena Rodgers, female   DOB: 1950-02-13, 65 y.o.   MRN: [id number] Attempted to update pt's insurance with [employer] have left a message and will await return call. Pt set for discharge tomorrow.

## 2015-04-27 NOTE — Progress Notes (Addendum)
Social Work Patient ID: Selena Rodgers, female   DOB: 07/08/1950, 65 y.o.   MRN: [id number] Met with pt to discussed team feels will be ready for discharge Thursday, pt would rather go home tomorrow due to easier for her sister and brother will be off [employer] Fri. Have left message for MD and Dan-PA aware. Will await return call from MD and work on discharge plans. Pt pref to use [employer] since employed by the hospital. Family education completed with sister today. Has all equipment.

## 2015-04-27 NOTE — Progress Notes (Addendum)
[name] PHYSICAL MEDICINE & REHABILITATION     PROGRESS NOTE  Subjective/Complaints:  Patient sitting up in her chair. She is anxious and wants to go home. She notes she had difficulty sleeping last night due to arthritis in her right hip, which is a chronic problem and she does not want to take medications for. Her sister is also present and inquires about her discharge date.  ROS: + Depression, anxiety. Denies CP, SOB, N/V/D  Objective: Vital Signs: Blood pressure 153/77, pulse 77, temperature 98.3 F (36.8 C), temperature source Oral, resp. rate 18, weight 123.152 kg (271 lb 8 oz), SpO2 96 %. No results found. No results for input(s): WBC, HGB, HCT, PLT in the last 72 hours. No results for input(s): NA, K, CL, GLUCOSE, BUN, CREATININE, CALCIUM in the last 72 hours.  Invalid input(s): CO CBG (last 3)  No results for input(s): GLUCAP in the last 72 hours.  Wt Readings from Last 3 Encounters:  04/26/15 123.152 kg (271 lb 8 oz)  04/19/15 124.739 kg (275 lb)  04/13/15 124.739 kg (275 lb)    Physical Exam:  BP 153/77 mmHg  Pulse 77  Temp(Src) 98.3 F (36.8 C) (Oral)  Resp 18  Wt 123.152 kg (271 lb 8 oz)  SpO2 96% Constitutional: She appears well-developed. Obese.  HENT: Normocephalic and atraumatic.  Eyes: Conjunctivae and EOM are normal.  Neck: Aspen cervical collar in place  Cardiovascular: Normal rate and regular rhythm.  Respiratory: Effort normal and breath sounds normal. No respiratory distress.  GI: Soft. Bowel sounds are normal. She exhibits no distension. + Ventral hernia  Musculoskeletal: + Right fifth digit arthritis, right shoulder with pain and limited range of motion. No TTP right hip.  Neurological: She is alert and oriented.  Motor: Bilateral upper extremities: Shoulder abduction 4+/5 (RUE somewhat limited by pain), distally 5/5 Bilateral lower extremities: Hip flexion: 4/5, knee extension 4+/5, ankle dorsi/plantarflexion 5/5  Skin: Skin is warm  and dry.  Psychiatric: Her speech is normal. Judgment normal. Her affect is labile. Cognition and memory are normal. She exhibits a depressed mood (improving).   Assessment/Plan: 1. Functional deficits secondary to severe cervical stenosis C3-4 and C4-5 status post anterior cervical discectomy decompression which require 3+ hours per day of interdisciplinary therapy in a comprehensive inpatient rehab setting. Physiatrist is providing close team supervision and 24 hour management of active medical problems listed below. Physiatrist and rehab team continue to assess barriers to discharge/monitor patient progress toward functional and medical goals.  Function:  Bathing Bathing position   Position: Shower  Bathing parts Body parts bathed by patient: Right arm, Left arm, Chest, Abdomen, Front perineal area, Right upper leg, Left upper leg Body parts bathed by helper: Buttocks, Back  Bathing assist Assist Level:  (Moderate assist)      Upper Body Dressing/Undressing Upper body dressing   What is the patient wearing?: Hospital gown                Upper body assist Assist Level: Set up   Set up : To obtain clothing/put away  Lower Body Dressing/Undressing Lower body dressing   What is the patient wearing?: Underwear, Socks, Shoes Underwear - Performed by patient: Pull underwear up/down Underwear - Performed by helper: Thread/unthread right underwear leg, Thread/unthread left underwear leg           Socks - Performed by helper: Don/doff right sock, Don/doff left sock   Shoes - Performed by helper: Don/doff right shoe, Don/doff left shoe, Fasten right,  Fasten left          Lower body assist Assist for lower body dressing:  (Max assist)      Toileting Toileting   Toileting steps completed by patient: Performs perineal hygiene, Adjust clothing prior to toileting Toileting steps completed by helper: Adjust clothing after toileting Toileting Assistive Devices: Grab bar or  rail  Toileting assist Assist level:  (Total assist)   Transfers Chair/bed transfer   Chair/bed transfer method: Ambulatory Chair/bed transfer assist level: Supervision or verbal cues Chair/bed transfer assistive device: Medical sales representative     Max distance: 150 ft Assist level: Supervision or verbal cues   Wheelchair   Type: Manual Max wheelchair distance: 329ft Assist Level: Supervision or verbal cues  Cognition Comprehension Comprehension assist level: Understands basic 90% of the time/cues < 10% of the time  Expression Expression assist level: Expresses complex 90% of the time/cues < 10% of the time  Social Interaction Social Interaction assist level: Interacts appropriately with others - No medications needed.  Problem Solving Problem solving assist level: Solves complex 90% of the time/cues < 10% of the time  Memory Memory assist level: Recognizes or recalls 90% of the time/requires cueing < 10% of the time    Medical Problem List and Plan: 1. 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 Patient with Aspen cervical collar.  Continue CIR 2. DVT Prophylaxis/Anticoagulation: SCDs. Monitor for any signs of DVT 3. Pain Management: Oxycodone and Robaxin as needed. Monitor with increased mobility 4. Mood: Celexa 20 mg daily, increased to 40 mg on 4/21  Xanax when necessary started on 4/21, Which she has not taken for several days, but encouraged patient to take if needed 5. Neuropsych: This patient is capable of making decisions on her own behalf. 6. Skin/Wound Care: Routine skin checks 7. Fluids/Electrolytes/Nutrition: Routine I&O   BMP within normal limits on 4/20 8. Constipation: Resolved   Laxative assistance 9. 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  Naproxen started on 4/23 10. HTN: Cont to monitor with increased  activity. Treat if warranted.   Overall, well controlled 11. Morbid obesity  Body mass index is 42.51 kg/(m^2).  Diet and exercise education  Will cont to encourage weight loss to increase endurance and promote overall health  LOS (Days) 6 A FACE TO FACE EVALUATION WAS PERFORMED  Selena Rodgers Lorie Phenix 04/27/2015 8:24 AM

## 2015-04-27 NOTE — Discharge Instructions (Signed)
Inpatient Rehab Discharge Instructions  Selena Rodgers Discharge date and time: No discharge date for patient encounter.   Activities/Precautions/ Functional Status: Activity: Cervical collar when out of bed Diet: regular diet Wound Care: keep wound clean and dry Functional status:  ___ No restrictions     ___ Walk up steps independently ___ 24/7 supervision/assistance   ___ Walk up steps with assistance ___ Intermittent supervision/assistance  ___ Bathe/dress independently ___ Walk with walker     _x__ Bathe/dress with assistance ___ Walk Independently    ___ Shower independently ___ Walk with assistance    ___ Shower with assistance ___ No alcohol     ___ Return to work/school ________  Special Instructions:    COMMUNITY REFERRALS UPON DISCHARGE:    Home Health:   PT, OT, RN  [place]   Date of last service:04/28/2015  Medical Equipment/Items Ordered:HAS ALL NEEDED EQUIPMENT     My questions have been answered and I understand these instructions. I will adhere to these goals and the provided educational materials after my discharge from the hospital.  Patient/Caregiver Signature _______________________________ Date __________  Clinician Signature _______________________________________ Date __________  Please bring this form and your medication list with you to all your follow-up doctor's appointments.

## 2015-04-27 NOTE — Discharge Summary (Signed)
Discharge summary job [id number]

## 2015-04-28 ENCOUNTER — Inpatient Hospital Stay (HOSPITAL_COMMUNITY): Payer: 59 | Admitting: Physical Therapy

## 2015-04-28 ENCOUNTER — Inpatient Hospital Stay (HOSPITAL_COMMUNITY): Payer: 59 | Admitting: Occupational Therapy

## 2015-04-28 MED ORDER — CITALOPRAM HYDROBROMIDE 40 MG PO TABS: 40.0000 mg | ORAL_TABLET | Freq: Every day | ORAL | Status: DC

## 2015-04-28 MED ORDER — OXYCODONE HCL 5 MG PO TABS: 5.0000 mg | ORAL_TABLET | ORAL | Status: DC | PRN

## 2015-04-28 MED ORDER — NAPROXEN 375 MG PO TABS: 375.0000 mg | ORAL_TABLET | Freq: Two times a day (BID) | ORAL | Status: DC

## 2015-04-28 MED ORDER — ALPRAZOLAM 0.5 MG PO TABS: 0.5000 mg | ORAL_TABLET | Freq: Three times a day (TID) | ORAL | Status: DC | PRN

## 2015-04-28 NOTE — Progress Notes (Signed)
Occupational Therapy Discharge Summary  Patient Details  Name: Selena Rodgers MRN: [id number] Date of Birth: July 01, 1950  Today's Date: 04/28/2015 OT Individual Time: 0815-0900 OT Individual Time Calculation (min): 45 min    Patient has met 13 of 13 long term goals due to improved activity tolerance, improved balance, ability to compensate for deficits, improved awareness and improved coordination.  Patient to discharge at overall supervision - min A level.  Patient's care partner is independent to provide the necessary physical assistance at discharge.    Reasons goals not met: all goals met   Recommendation:  Patient will benefit from ongoing skilled OT services in home health setting to continue to advance functional skills in the area of BADL and iADL.  Equipment: No equipment provided  Reasons for discharge: treatment goals met  Patient/family agrees with progress made and goals achieved: Yes   OT Intervention: Upon entering the room, pt seated in wheelchair awaiting therapist with no c/o pain . Pt reports performing all self care prior to therapist arrival. Pt demonstrates [facility] and strengthening exercises with mod I and use of paper handout. OT answering questions pt had regarding discharge on HHOT recommendation. Pt with no further questions and all needs within reach upon exiting the room.   OT Discharge Precautions/Restrictions  Precautions Precautions: Cervical Required Braces or Orthoses: Cervical Brace Cervical Brace: Hard collar;At all times Pain Pain Assessment Pain Assessment: 0-10 ADL ADL ADL Comments: See Functional Assessment Tool Vision/Perception  Vision- History Baseline Vision/History: Wears glasses Wears Glasses: At all times Patient Visual Report: No change from baseline  Cognition Overall Cognitive Status: Within Functional Limits for tasks assessed Arousal/Alertness: Awake/alert Orientation Level: Oriented X4 Memory: Appears  intact Awareness: Appears intact Problem Solving: Appears intact Safety/Judgment: Appears intact Sensation Sensation Light Touch: Appears Intact Mobility  Bed Mobility Bed Mobility: Rolling Right;Rolling Left;Supine to Sit;Sit to Supine Rolling Right: 6: Modified independent (Device/Increase time) Rolling Left: 6: Modified independent (Device/Increase time) Supine to Sit: 5: Supervision Sit to Supine: 6: Modified independent (Device/Increase time) Transfers Sit to Stand: 6: Modified independent (Device/Increase time) Stand to Sit: 6: Modified independent (Device/Increase time)   Balance Balance Balance Assessed: Yes Dynamic Standing Balance Dynamic Standing - Balance Support: Right upper extremity supported Dynamic Standing - Level of Assistance: 6: Modified independent (Device/Increase time) Extremity/Trunk Assessment RUE Assessment RUE Assessment: Not tested LUE Assessment LUE Assessment: Not tested LUE AROM (degrees) Overall AROM Left Upper Extremity: Due to precautions   See Function Navigator for Current Functional Status.  [name] 04/28/2015, 4:36 PM

## 2015-04-28 NOTE — Progress Notes (Signed)
[place] PHYSICAL MEDICINE & REHABILITATION  PROGRESS NOTE  Subjective/Complaints:  Patient seen this morning having finished using the restroom. She is in better spirits today and is very much looking for to going home.  ROS: + Depression, anxiety. Denies CP, SOB, N/V/D  Objective: Vital Signs: Blood pressure 117/65, pulse 81, temperature 98.4 F (36.9 C), temperature source Oral, resp. rate 16, weight 123.152 kg (271 lb 8 oz), SpO2 99 %. No results found. No results for input(s): WBC, HGB, HCT, PLT in the last 72 hours. No results for input(s): NA, K, CL, GLUCOSE, BUN, CREATININE, CALCIUM in the last 72 hours.  Invalid input(s): CO CBG (last 3)  No results for input(s): GLUCAP in the last 72 hours.  Wt Readings from Last 3 Encounters:  04/26/15 123.152 kg (271 lb 8 oz)  04/19/15 124.739 kg (275 lb)  04/13/15 124.739 kg (275 lb)    Physical Exam:  BP 117/65 mmHg  Pulse 81  Temp(Src) 98.4 F (36.9 C) (Oral)  Resp 16  Wt 123.152 kg (271 lb 8 oz)  SpO2 99% Constitutional: She appears well-developed. Obese.  HENT: Normocephalic and atraumatic.  Eyes: Conjunctivae and EOM are normal.  Neck: Aspen cervical collar in place  Cardiovascular: Normal rate and regular rhythm.  Respiratory: Effort normal and breath sounds normal. No respiratory distress.  GI: Soft. Bowel sounds are normal. She exhibits no distension. + Ventral hernia  Musculoskeletal: + Right fifth digit arthritis, right shoulder with pain and limited range of motion. No TTP right hip.  Neurological: She is alert and oriented.  Motor: Bilateral upper extremities: Shoulder abduction 4+/5 (RUE somewhat limited by pain), distally 5/5 Bilateral lower extremities: Hip flexion: 4+/5, knee extension 4+/5, ankle dorsi/plantarflexion 5/5  Skin: Skin is warm and dry.  Psychiatric: Her speech is normal. Judgment normal. Her affect is labile. Cognition and memory are normal. She exhibits a depressed mood  (improving).   Assessment/Plan: 1. Functional deficits secondary to severe cervical stenosis C3-4 and C4-5 status post anterior cervical discectomy decompression which require 3+ hours per day of interdisciplinary therapy in a comprehensive inpatient rehab setting. Physiatrist is providing close team supervision and 24 hour management of active medical problems listed below. Physiatrist and rehab team continue to assess barriers to discharge/monitor patient progress toward functional and medical goals.  Function:  Bathing Bathing position   Position: Shower  Bathing parts Body parts bathed by patient: Right arm, Left arm, Chest, Abdomen, Front perineal area, Right upper leg, Left upper leg, Buttocks Body parts bathed by helper: Buttocks, Back  Bathing assist Assist Level: Supervision or verbal cues      Upper Body Dressing/Undressing Upper body dressing   What is the patient wearing?: Pull over shirt/dress     Pull over shirt/dress - Perfomed by patient: Thread/unthread right sleeve, Thread/unthread left sleeve, Put head through opening Pull over shirt/dress - Perfomed by helper: Pull shirt over trunk        Upper body assist Assist Level: Touching or steadying assistance(Pt > 75%)   Set up : To obtain clothing/put away  Lower Body Dressing/Undressing Lower body dressing   What is the patient wearing?: Underwear, Non-skid slipper socks, Shoes Underwear - Performed by patient: Thread/unthread right underwear leg, Thread/unthread left underwear leg Underwear - Performed by helper: Pull underwear up/down           Socks - Performed by helper: Don/doff right sock, Don/doff left sock Shoes - Performed by patient: Don/doff right shoe, Don/doff left shoe Shoes - Performed by  helper: Fasten right, Fasten left          Lower body assist Assist for lower body dressing:  (Max assist)      Toileting Toileting   Toileting steps completed by patient: Performs perineal hygiene,  Adjust clothing prior to toileting Toileting steps completed by helper: Adjust clothing after toileting Toileting Assistive Devices: Grab bar or rail  Toileting assist Assist level: Touching or steadying assistance (Pt.75%)   Transfers Chair/bed transfer   Chair/bed transfer method: Ambulatory Chair/bed transfer assist level: Supervision or verbal cues Chair/bed transfer assistive device: Medical sales representative     Max distance: >200 ft Assist level: Supervision or verbal cues   Wheelchair   Type: Manual Max wheelchair distance: 365ft Assist Level: Supervision or verbal cues  Cognition Comprehension Comprehension assist level: Follows complex conversation/direction with no assist  Expression Expression assist level: Expresses complex ideas: With no assist  Social Interaction Social Interaction assist level: Interacts appropriately with others with medication or extra time (anti-anxiety, antidepressant).  Problem Solving Problem solving assist level: Solves complex 90% of the time/cues < 10% of the time  Memory Memory assist level: Complete Independence: No helper    Medical Problem List and Plan: 1. 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 Patient with Aspen cervical collar.  DC today 2. DVT Prophylaxis/Anticoagulation: SCDs. Monitor for any signs of DVT 3. Pain Management: Oxycodone and Robaxin as needed. Monitor with increased mobility 4. Mood: Celexa 20 mg daily, increased to 40 mg on 4/21  Xanax when necessary started on 4/21 5. Neuropsych: This patient is capable of making decisions on her own behalf. 6. Skin/Wound Care: Routine skin checks 7. Fluids/Electrolytes/Nutrition: Routine I&O   BMP within normal limits on 4/20 8. Constipation: Resolved   Laxative assistance 9. Arthritis in right shoulder: Manage with modalities and meds if  necessary.  Naproxen started on 4/23 10. HTN: Cont to monitor with increased activity. Treat if warranted.   Overall, well controlled 11. Morbid obesity  Body mass index is 42.51 kg/(m^2).  Diet and exercise education  Will cont to encourage weight loss to increase endurance and promote overall health  LOS (Days) 7 A FACE TO FACE EVALUATION WAS PERFORMED  [name] 04/28/2015 9:02 AM

## 2015-04-28 NOTE — Plan of Care (Signed)
Problem: RH Bed Mobility Goal: LTG Patient will perform bed mobility with assist (PT) LTG: Patient will perform bed mobility with assistance, with/without cues (PT).  Outcome: Completed/Met Date Met:  04/28/15 With bed rail  Problem: RH Bed to Chair Transfers Goal: LTG Patient will perform bed/chair transfers w/assist (PT) LTG: Patient will perform bed/chair transfers with assistance, with/without cues (PT).  Outcome: Completed/Met Date Met:  04/28/15 Ambulatory with rollator     Problem: RH Ambulation Goal: LTG Patient will ambulate in controlled environment (PT) LTG: Patient will ambulate in a controlled environment, # of feet with assistance (PT).  Outcome: Completed/Met Date Met:  04/28/15 200 ft with rollator Goal: LTG Patient will ambulate in home environment (PT) LTG: Patient will ambulate in home environment, # of feet with assistance (PT).  Outcome: Completed/Met Date Met:  04/28/15 50 ft with rollator Goal: LTG Patient will ambulate in community environment (PT) LTG: Patient will ambulate in community environment, # of feet with assistance (PT).  Outcome: Completed/Met Date Met:  04/28/15 150 ft with rollator  Problem: RH Wheelchair Mobility Goal: LTG Patient will propel w/c in controlled environment (PT) LTG: Patient will propel wheelchair in controlled environment, # of feet with assist (PT)  Outcome: Completed/Met Date Met:  04/28/15 150 ft with rollator  Problem: RH Stairs Goal: LTG Patient will ambulate up and down stairs w/assist (PT) LTG: Patient will ambulate up and down # of stairs with assistance (PT)  Outcome: Completed/Met Date Met:  04/28/15 8 steps with B rails

## 2015-04-28 NOTE — Progress Notes (Signed)
Physical Therapy Discharge Summary  Patient Details  Name: Selena Rodgers MRN: [id number] Date of Birth: 29-Apr-1950  Today's Date: 04/28/2015 PT Individual Time: 0902-0947 PT Individual Time Calculation (min): 45 min    Patient has met 8 of 8 long term goals due to improved activity tolerance, improved balance, improved postural control, increased strength, decreased pain, ability to compensate for deficits and improved awareness.  Patient to discharge at an ambulatory level Modified Independent.   Patient's care partner is independent to provide the necessary physical assistance at discharge.  Reasons goals not met: N/A - pt met all goals  Recommendation:  Patient will benefit from ongoing skilled PT services in home health setting to continue to advance safe functional mobility, adapt mobility in the home, increase standing balance, and minimize fall risk.  Equipment: No equipment provided  Reasons for discharge: treatment goals met  Patient/family agrees with progress made and goals achieved: Yes  Skilled PT treatment: Pt received in w/c agreeable to PT, with no c/o pain at rest. Pt self propelled w/c x 200 ft with BUE & BLE & mod I. Pt able to complete car transfer with rollator & Mod I & sit<>stand transfers with mod I. Pt able to negotiate ramp, uneven surface, & even surface with rollator & mod I without LOB on this date. PT educated pt on need to get orthotic shoes after d/c to provide more support due to ankle varus; pt agreeable. Pt able to transfer sitting EOB<>supine & Roll R with use of bed rail, roll L without rail. Pt able to roll L<>R & sit>supine with Mod I but required supervision to transfer supine>sitting EOB with use of bed rail & significantly extra time. Pt negotiated 4 steps x 2 with B railings & supervision for BLE strengthening. Pt able to ambulate 200 ft with rollator & Mod I to return to room. Pt self reports she picks up objects off of floor with reacher & has  5 (one for each room) at home. Pt very motivated to return home & feels comfortable with d/c home today. At end of session pt left in w/c with all needs within reach.   PT Discharge Precautions/Restrictions Precautions Precautions: Cervical Cervical Brace: Hard collar (aspen collar) Restrictions Weight Bearing Restrictions: No  Pain Pain Assessment Pain Assessment: No/denies pain at rest Vision/Perception    Wears glasses all the time - no changes from baseline Cognition Overall Cognitive Status: Within Functional Limits for tasks assessed Orientation Level: Oriented X4 Memory: Appears intact Awareness: Appears intact Problem Solving: Appears intact Safety/Judgment: Appears intact Sensation Sensation Light Touch: Appears Intact (BLE)  Mobility Bed Mobility Bed Mobility: Rolling Right;Rolling Left;Supine to Sit;Sit to Supine (utilized bed rail for bed mobility) Rolling Right: 6: Modified independent (Device/Increase time) Rolling Left: 6: Modified independent (Device/Increase time) Supine to Sit: 5: Supervision Sit to Supine: 6: Modified independent (Device/Increase time) Transfers Transfers: Yes Sit to Stand: 6: Modified independent (Device/Increase time) Stand to Sit: 6: Modified independent (Device/Increase time) Locomotion  Ambulation Ambulation: Yes Ambulation/Gait Assistance: 6: Modified independent (Device/Increase time) Ambulation Distance (Feet): 200 Feet Assistive device: Rollator Gait Gait: Yes Gait Pattern: Decreased step length - left;Decreased step length - right;Shuffle (ankle varus) Stairs / Additional Locomotion Stairs: Yes Stairs Assistance: 5: Supervision Stair Management Technique: Two rails Number of Stairs: 8 Height of Stairs: 6 (inches) Ramp: 6: Modified independent (Device) (with rollator) Product manager Mobility: Yes Wheelchair Assistance: 6: Modified independent (Device/Increase time) Environmental health practitioner: Both upper  extremities;Both lower extermities Wheelchair Parts  Management: Independent   Balance Balance Balance Assessed: Yes Static Standing Balance Static Standing - Balance Support: Bilateral upper extremity supported Static Standing - Level of Assistance: 6: Modified independent (Device/Increase time) Dynamic Standing Balance Dynamic Standing - Balance Support: Right upper extremity supported Dynamic Standing - Level of Assistance: 6: Modified independent (Device/Increase time) Extremity Assessment      RLE Assessment RLE Assessment: Within Functional Limits (grossly 5/5 MMT) LLE Assessment LLE Assessment: Within Functional Limits (grossly 5/5 MMT)   See Function Navigator for Current Functional Status.  Waunita Schooner 04/28/2015, 9:28 AM

## 2015-04-28 NOTE — Progress Notes (Signed)
Pt. discharged to home accompanied by sister. VSS, denies pain. Incision with steri's CDI. C-collar on. Verbalizes understanding of DC directions given by Marlowe Shores PAC.

## 2015-04-30 ENCOUNTER — Telehealth: Payer: Self-pay

## 2015-04-30 NOTE — Telephone Encounter (Signed)
Cindy-RN with [facility]-states that pt refused services yesterday. Pt does not feel that it is necessary because she is doing well on her own. Just an FYI.

## 2015-05-03 ENCOUNTER — Other Ambulatory Visit: Payer: Self-pay

## 2015-05-03 NOTE — Patient Outreach (Signed)
[facility]) Care Management  05/03/2015  Selena Rodgers 10-04-1950 PT:[id number]  Telephone call to follow up with member after discharge from Inpatient rehab on 04/28/15.  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 Member states that she is doing well since she got home.  States her sister is staying with her at night.  States she only needs assistance with bathing due to the cervical collar she has to wear.  States her Mother and Elenor Legato have been coming to be with her during the day as needed.  States she declined [place] as she did not feel that she needed any of their services.  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  States she is doing the exercises that PT taught her.  States she is to see her primary care provider on 05/05/15 and her neurosurgeon on 05/06/15.  States she is taking her medication without difficulty.  Denies needing any further assistance at this time. Instructed on [employer] and how to contact RNCM if her needs change. Reviewed discharge instruction and when to contact MD Member assessed with no further intervention needed Peter Garter RN, [facility] Care Management Coordinator-Link to [place] Management [phone]

## 2015-05-06 DIAGNOSIS — M4802 Spinal stenosis, cervical region: Secondary | ICD-10-CM | POA: Diagnosis not present

## 2015-05-06 DIAGNOSIS — M4712 Other spondylosis with myelopathy, cervical region: Secondary | ICD-10-CM | POA: Diagnosis not present

## 2015-05-24 DIAGNOSIS — Z760 Encounter for issue of repeat prescription: Secondary | ICD-10-CM | POA: Diagnosis not present

## 2015-06-15 DIAGNOSIS — E7800 Pure hypercholesterolemia, unspecified: Secondary | ICD-10-CM | POA: Diagnosis not present

## 2015-06-15 DIAGNOSIS — I10 Essential (primary) hypertension: Secondary | ICD-10-CM | POA: Diagnosis not present

## 2015-06-22 DIAGNOSIS — E7800 Pure hypercholesterolemia, unspecified: Secondary | ICD-10-CM | POA: Diagnosis not present

## 2015-06-22 DIAGNOSIS — Z0001 Encounter for general adult medical examination with abnormal findings: Secondary | ICD-10-CM | POA: Diagnosis not present

## 2015-06-22 DIAGNOSIS — Z9889 Other specified postprocedural states: Secondary | ICD-10-CM | POA: Insufficient documentation

## 2015-06-22 DIAGNOSIS — F325 Major depressive disorder, single episode, in full remission: Secondary | ICD-10-CM | POA: Diagnosis not present

## 2015-06-22 DIAGNOSIS — I10 Essential (primary) hypertension: Secondary | ICD-10-CM | POA: Diagnosis not present

## 2015-08-27 DIAGNOSIS — G959 Disease of spinal cord, unspecified: Secondary | ICD-10-CM | POA: Diagnosis not present

## 2015-08-27 DIAGNOSIS — M4712 Other spondylosis with myelopathy, cervical region: Secondary | ICD-10-CM | POA: Diagnosis not present

## 2016-01-13 DIAGNOSIS — I10 Essential (primary) hypertension: Secondary | ICD-10-CM | POA: Diagnosis not present

## 2016-02-25 DIAGNOSIS — M542 Cervicalgia: Secondary | ICD-10-CM | POA: Diagnosis not present

## 2016-02-25 DIAGNOSIS — G959 Disease of spinal cord, unspecified: Secondary | ICD-10-CM | POA: Diagnosis not present

## 2016-07-06 DIAGNOSIS — I10 Essential (primary) hypertension: Secondary | ICD-10-CM | POA: Diagnosis not present

## 2016-07-13 DIAGNOSIS — E7800 Pure hypercholesterolemia, unspecified: Secondary | ICD-10-CM | POA: Diagnosis not present

## 2016-07-13 DIAGNOSIS — Z0001 Encounter for general adult medical examination with abnormal findings: Secondary | ICD-10-CM | POA: Diagnosis not present

## 2016-07-13 DIAGNOSIS — I10 Essential (primary) hypertension: Secondary | ICD-10-CM | POA: Diagnosis not present

## 2016-07-13 DIAGNOSIS — R7302 Impaired glucose tolerance (oral): Secondary | ICD-10-CM | POA: Diagnosis not present

## 2016-08-24 DIAGNOSIS — H524 Presbyopia: Secondary | ICD-10-CM | POA: Diagnosis not present

## 2016-08-24 DIAGNOSIS — H5203 Hypermetropia, bilateral: Secondary | ICD-10-CM | POA: Diagnosis not present

## 2017-01-17 DIAGNOSIS — I10 Essential (primary) hypertension: Secondary | ICD-10-CM | POA: Diagnosis not present

## 2017-01-17 DIAGNOSIS — E7800 Pure hypercholesterolemia, unspecified: Secondary | ICD-10-CM | POA: Diagnosis not present

## 2017-01-17 DIAGNOSIS — F325 Major depressive disorder, single episode, in full remission: Secondary | ICD-10-CM | POA: Diagnosis not present

## 2017-01-17 DIAGNOSIS — M1990 Unspecified osteoarthritis, unspecified site: Secondary | ICD-10-CM | POA: Diagnosis not present

## 2019-05-20 ENCOUNTER — Other Ambulatory Visit: Payer: Self-pay | Admitting: Neurosurgery

## 2019-05-20 DIAGNOSIS — M48062 Spinal stenosis, lumbar region with neurogenic claudication: Secondary | ICD-10-CM

## 2019-06-05 ENCOUNTER — Other Ambulatory Visit: Payer: Self-pay | Admitting: Neurosurgery

## 2019-06-11 ENCOUNTER — Other Ambulatory Visit: Payer: Self-pay | Admitting: Neurosurgery

## 2019-06-21 ENCOUNTER — Other Ambulatory Visit: Payer: 59

## 2019-06-23 ENCOUNTER — Encounter (HOSPITAL_COMMUNITY)
Admission: RE | Admit: 2019-06-23 | Discharge: 2019-06-23 | Disposition: A | Discharge status: Home / Self Care | Payer: Medicare HMO | Source: Ambulatory Visit | Attending: Neurosurgery | Admitting: Neurosurgery

## 2019-06-23 ENCOUNTER — Encounter (HOSPITAL_COMMUNITY): Payer: Self-pay

## 2019-06-23 ENCOUNTER — Other Ambulatory Visit: Payer: Self-pay

## 2019-06-23 ENCOUNTER — Other Ambulatory Visit (HOSPITAL_COMMUNITY)
Admission: RE | Admit: 2019-06-23 | Discharge: 2019-06-23 | Disposition: A | Discharge status: Home / Self Care | Payer: Medicare HMO | Source: Ambulatory Visit | Attending: Neurosurgery | Admitting: Neurosurgery

## 2019-06-23 DIAGNOSIS — Z01812 Encounter for preprocedural laboratory examination: Secondary | ICD-10-CM | POA: Insufficient documentation

## 2019-06-23 DIAGNOSIS — Z20822 Contact with and (suspected) exposure to covid-19: Secondary | ICD-10-CM | POA: Insufficient documentation

## 2019-06-23 HISTORY — DX: Anxiety disorder, unspecified: F41.9

## 2019-06-23 LAB — BASIC METABOLIC PANEL
Anion gap: 11 (ref 5–15)
BUN: 12 mg/dL (ref 8–23)
CO2: 23 mmol/L (ref 22–32)
Calcium: 9.4 mg/dL (ref 8.9–10.3)
Chloride: 103 mmol/L (ref 98–111)
Creatinine, Ser: 0.61 mg/dL (ref 0.44–1.00)
GFR calc Af Amer: >60 mL/min (ref >60)
GFR calc non Af Amer: >60 mL/min (ref >60)
Glucose, Bld: 97 mg/dL (ref 70–99)
Potassium: 4.3 mmol/L (ref 3.5–5.1)
Sodium: 137 mmol/L (ref 135–145)

## 2019-06-23 LAB — TYPE AND SCREEN
ABO/RH(D): A POS
Antibody Screen: NEGATIVE

## 2019-06-23 LAB — CBC
HCT: 42.8 % (ref 36.0–46.0)
Hemoglobin: 14.1 g/dL (ref 12.0–15.0)
MCH: 30.7 pg (ref 26.0–34.0)
MCHC: 32.9 g/dL (ref 30.0–36.0)
MCV: 93.2 fL (ref 80.0–100.0)
Platelets: 259 10*3/uL (ref 150–400)
RBC: 4.59 MIL/uL (ref 3.87–5.11)
RDW: 12.9 % (ref 11.5–15.5)
WBC: 9.8 10*3/uL (ref 4.0–10.5)
nRBC: 0 % (ref 0.0–0.2)

## 2019-06-23 LAB — SURGICAL PCR SCREEN
MRSA, PCR: NEGATIVE
Staphylococcus aureus: NEGATIVE

## 2019-06-23 LAB — SARS CORONAVIRUS 2 (TAT 6-24 HRS): SARS Coronavirus 2: NEGATIVE

## 2019-06-23 LAB — ABO/RH: ABO/RH(D): A POS

## 2019-06-23 NOTE — Progress Notes (Signed)
[id number] - [place], [place] - [address] [place] [address] Phone: [phone] Fax: [phone]      Your procedure is scheduled on June 25, 2019.  Report to [facility] Main Entrance "A" at 5:30 A.M., and check in at the Admitting office.  Call this number if you have problems the morning of surgery:  [phone]  Call [phone] if you have any questions prior to your surgery date Monday-Friday 8am-4pm    Remember:  Do not eat or drink after midnight the night before your surgery    Take these medicines the morning of surgery with A SIP OF WATER: citalopram (CELEXA) omeprazole (PRILOSEC) acetaminophen (TYLENOL) methocarbamol (ROBAXIN) - as needed sodium chloride (OCEAN) - as needed traMADol (ULTRAM) - as needed  As of today, STOP taking any Aspirin (unless otherwise instructed by your surgeon) and Aspirin containing products, Aleve, Naproxen, Ibuprofen, Motrin, Advil, Goody's, BC's, all herbal medications, fish oil, and all vitamins.                      Do not wear jewelry, make up, or nail polish            Do not wear lotions, powders, perfumes or deodorant.            Do not shave 48 hours prior to surgery.              Do not bring valuables to the hospital.            [facility] is not responsible for any belongings or valuables.  Do NOT Smoke (Tobacco/Vapping) or drink Alcohol 24 hours prior to your procedure If you use a CPAP at night, you may bring all equipment for your overnight stay.   Contacts, glasses, dentures or bridgework may not be worn into surgery.      For patients admitted to the hospital, discharge time will be determined by your treatment team.   Patients discharged the day of surgery will not be allowed to drive home, and someone needs to stay with them for 24 hours.    Special instructions:   [place]- Preparing For Surgery  Before surgery, you can play an important role. Because skin is not sterile,  your skin needs to be as free of germs as possible. You can reduce the number of germs on your skin by washing with CHG (chlorahexidine gluconate) Soap before surgery.  CHG is an antiseptic cleaner which kills germs and bonds with the skin to continue killing germs even after washing.    Oral Hygiene is also important to reduce your risk of infection.  Remember - BRUSH YOUR TEETH THE MORNING OF SURGERY WITH YOUR REGULAR TOOTHPASTE  Please do not use if you have an allergy to CHG or antibacterial soaps. If your skin becomes reddened/irritated stop using the CHG.  Do not shave (including legs and underarms) for at least 48 hours prior to first CHG shower. It is OK to shave your face.  Please follow these instructions carefully.   1. Shower the NIGHT BEFORE SURGERY and the MORNING OF SURGERY with CHG Soap.   2. If you chose to wash your hair, wash your hair first as usual with your normal shampoo.  3. After you shampoo, rinse your hair and body thoroughly to remove the shampoo.  4. Use CHG as you would any other liquid soap. You can apply CHG directly to the skin and wash gently with  a scrungie or a clean washcloth.   5. Apply the CHG Soap to your body ONLY FROM THE NECK DOWN.  Do not use on open wounds or open sores. Avoid contact with your eyes, ears, mouth and genitals (private parts). Wash Face and genitals (private parts)  with your normal soap.   6. Wash thoroughly, paying special attention to the area where your surgery will be performed.  7. Thoroughly rinse your body with warm water from the neck down.  8. DO NOT shower/wash with your normal soap after using and rinsing off the CHG Soap.  9. Pat yourself dry with a CLEAN TOWEL.  10. Wear CLEAN PAJAMAS to bed the night before surgery, wear comfortable clothes the morning of surgery  11. Place CLEAN SHEETS on your bed the night of your first shower and DO NOT SLEEP WITH PETS.   Day of Surgery:   Do not apply any  deodorants/lotions.  Please wear clean clothes to the hospital/surgery center.   Remember to brush your teeth WITH YOUR REGULAR TOOTHPASTE.   Please read over the following fact sheets that you were given.

## 2019-06-23 NOTE — Progress Notes (Signed)
PCP:  Harrel Lemon, MD Cardiologist:  Denies  EKG:  12/25/2013 CXR:  08/21/05 ECHO:  Denies Stress Test: Denies Cardiac Cath:  Denies  Covid test 06/23/19  Patient denies shortness of breath, fever, cough, and chest pain at PAT appointment.  Patient verbalized understanding of instructions provided today at the PAT appointment.  Patient asked to review instructions at home and day of surgery.

## 2019-06-24 MED ORDER — DEXTROSE 5 % IV SOLN
3.0000 g | INTRAVENOUS | Status: AC
  Administered 2019-06-25 (×2): 3 g via INTRAVENOUS
  Filled 2019-06-24: qty 3

## 2019-06-25 ENCOUNTER — Inpatient Hospital Stay (HOSPITAL_COMMUNITY): Payer: Medicare HMO

## 2019-06-25 ENCOUNTER — Other Ambulatory Visit: Payer: Self-pay

## 2019-06-25 ENCOUNTER — Inpatient Hospital Stay (HOSPITAL_COMMUNITY): Payer: Medicare HMO | Admitting: Certified Registered Nurse Anesthetist

## 2019-06-25 ENCOUNTER — Inpatient Hospital Stay (HOSPITAL_COMMUNITY)
Admission: RE | Admit: 2019-06-25 | Discharge: 2019-07-04 | DRG: 454 | Disposition: A | Discharge status: Skilled Nursing Facility | Payer: Medicare HMO | Attending: Neurosurgery | Admitting: Neurosurgery

## 2019-06-25 ENCOUNTER — Encounter (HOSPITAL_COMMUNITY)
Admission: RE | Disposition: A | Discharge status: Skilled Nursing Facility | Payer: Self-pay | Source: Home / Self Care | Attending: Neurosurgery

## 2019-06-25 ENCOUNTER — Encounter (HOSPITAL_COMMUNITY): Payer: Self-pay | Admitting: Neurosurgery

## 2019-06-25 DIAGNOSIS — Z886 Allergy status to analgesic agent status: Secondary | ICD-10-CM | POA: Diagnosis not present

## 2019-06-25 DIAGNOSIS — Z91040 Latex allergy status: Secondary | ICD-10-CM | POA: Diagnosis not present

## 2019-06-25 DIAGNOSIS — Z885 Allergy status to narcotic agent status: Secondary | ICD-10-CM | POA: Diagnosis not present

## 2019-06-25 DIAGNOSIS — Z888 Allergy status to other drugs, medicaments and biological substances status: Secondary | ICD-10-CM | POA: Diagnosis not present

## 2019-06-25 DIAGNOSIS — Z20822 Contact with and (suspected) exposure to covid-19: Secondary | ICD-10-CM | POA: Diagnosis present

## 2019-06-25 DIAGNOSIS — M48062 Spinal stenosis, lumbar region with neurogenic claudication: Secondary | ICD-10-CM | POA: Diagnosis present

## 2019-06-25 DIAGNOSIS — Z6841 Body Mass Index (BMI) 40.0 and over, adult: Secondary | ICD-10-CM

## 2019-06-25 DIAGNOSIS — Z887 Allergy status to serum and vaccine status: Secondary | ICD-10-CM | POA: Diagnosis not present

## 2019-06-25 DIAGNOSIS — G8929 Other chronic pain: Secondary | ICD-10-CM | POA: Diagnosis present

## 2019-06-25 DIAGNOSIS — M4186 Other forms of scoliosis, lumbar region: Secondary | ICD-10-CM | POA: Diagnosis present

## 2019-06-25 DIAGNOSIS — Z791 Long term (current) use of non-steroidal anti-inflammatories (NSAID): Secondary | ICD-10-CM | POA: Diagnosis not present

## 2019-06-25 DIAGNOSIS — I10 Essential (primary) hypertension: Secondary | ICD-10-CM | POA: Diagnosis present

## 2019-06-25 DIAGNOSIS — Z8249 Family history of ischemic heart disease and other diseases of the circulatory system: Secondary | ICD-10-CM | POA: Diagnosis not present

## 2019-06-25 DIAGNOSIS — Z981 Arthrodesis status: Secondary | ICD-10-CM | POA: Diagnosis not present

## 2019-06-25 DIAGNOSIS — Z833 Family history of diabetes mellitus: Secondary | ICD-10-CM | POA: Diagnosis not present

## 2019-06-25 DIAGNOSIS — Z79899 Other long term (current) drug therapy: Secondary | ICD-10-CM

## 2019-06-25 DIAGNOSIS — M5116 Intervertebral disc disorders with radiculopathy, lumbar region: Secondary | ICD-10-CM | POA: Diagnosis present

## 2019-06-25 DIAGNOSIS — F419 Anxiety disorder, unspecified: Secondary | ICD-10-CM | POA: Diagnosis present

## 2019-06-25 DIAGNOSIS — M4316 Spondylolisthesis, lumbar region: Secondary | ICD-10-CM | POA: Diagnosis present

## 2019-06-25 DIAGNOSIS — Z419 Encounter for procedure for purposes other than remedying health state, unspecified: Secondary | ICD-10-CM

## 2019-06-25 DIAGNOSIS — M4726 Other spondylosis with radiculopathy, lumbar region: Secondary | ICD-10-CM | POA: Diagnosis present

## 2019-06-25 DIAGNOSIS — F17210 Nicotine dependence, cigarettes, uncomplicated: Secondary | ICD-10-CM | POA: Diagnosis present

## 2019-06-25 SURGERY — POSTERIOR LUMBAR FUSION 3 LEVEL
Anesthesia: General

## 2019-06-25 MED ORDER — DEXAMETHASONE SODIUM PHOSPHATE 10 MG/ML IJ SOLN
INTRAMUSCULAR | Status: AC
  Filled 2019-06-25: qty 1

## 2019-06-25 MED ORDER — ONDANSETRON HCL 4 MG/2ML IJ SOLN: 4.0000 mg | Freq: Four times a day (QID) | INTRAMUSCULAR | Status: DC | PRN

## 2019-06-25 MED ORDER — SODIUM CHLORIDE 0.9 % IV SOLN
INTRAVENOUS | Status: DC | PRN
  Administered 2019-06-25: 500 mL

## 2019-06-25 MED ORDER — THROMBIN 5000 UNITS EX SOLR
CUTANEOUS | Status: AC
  Filled 2019-06-25: qty 5000

## 2019-06-25 MED ORDER — OXYCODONE HCL 5 MG PO TABS
5.0000 mg | ORAL_TABLET | ORAL | Status: DC | PRN
  Administered 2019-06-27 – 2019-07-04 (×9): 5 mg via ORAL
  Filled 2019-06-25 (×9): qty 1

## 2019-06-25 MED ORDER — FENTANYL CITRATE (PF) 250 MCG/5ML IJ SOLN
INTRAMUSCULAR | Status: AC
  Filled 2019-06-25: qty 5

## 2019-06-25 MED ORDER — CEFAZOLIN SODIUM-DEXTROSE 2-4 GM/100ML-% IV SOLN
2.0000 g | Freq: Three times a day (TID) | INTRAVENOUS | Status: AC
  Administered 2019-06-25 – 2019-06-26 (×2): 2 g via INTRAVENOUS
  Filled 2019-06-25 (×2): qty 100

## 2019-06-25 MED ORDER — FENTANYL CITRATE (PF) 100 MCG/2ML IJ SOLN
25.0000 ug | INTRAMUSCULAR | Status: DC | PRN
  Administered 2019-06-25 (×3): 50 ug via INTRAVENOUS

## 2019-06-25 MED ORDER — PHENYLEPHRINE 40 MCG/ML (10ML) SYRINGE FOR IV PUSH (FOR BLOOD PRESSURE SUPPORT)
PREFILLED_SYRINGE | INTRAVENOUS | Status: AC
  Filled 2019-06-25: qty 10

## 2019-06-25 MED ORDER — ACETAMINOPHEN 325 MG PO TABS
650.0000 mg | ORAL_TABLET | ORAL | Status: DC | PRN
  Administered 2019-06-26 – 2019-07-04 (×7): 650 mg via ORAL
  Filled 2019-06-25 (×7): qty 2

## 2019-06-25 MED ORDER — CITALOPRAM HYDROBROMIDE 10 MG PO TABS
20.0000 mg | ORAL_TABLET | Freq: Every day | ORAL | Status: DC
  Administered 2019-06-26 – 2019-07-04 (×9): 20 mg via ORAL
  Filled 2019-06-25 (×9): qty 2

## 2019-06-25 MED ORDER — FENTANYL CITRATE (PF) 100 MCG/2ML IJ SOLN
INTRAMUSCULAR | Status: AC
  Filled 2019-06-25: qty 2

## 2019-06-25 MED ORDER — CHLORHEXIDINE GLUCONATE CLOTH 2 % EX PADS: 6.0000 | MEDICATED_PAD | Freq: Once | CUTANEOUS | Status: DC

## 2019-06-25 MED ORDER — PROPOFOL 10 MG/ML IV BOLUS
INTRAVENOUS | Status: DC | PRN
  Administered 2019-06-25: 150 mg via INTRAVENOUS

## 2019-06-25 MED ORDER — MIDAZOLAM HCL 5 MG/5ML IJ SOLN
INTRAMUSCULAR | Status: DC | PRN
  Administered 2019-06-25: 2 mg via INTRAVENOUS

## 2019-06-25 MED ORDER — THROMBIN 20000 UNITS EX SOLR
CUTANEOUS | Status: AC
  Filled 2019-06-25: qty 20000

## 2019-06-25 MED ORDER — DOCUSATE SODIUM 100 MG PO CAPS
100.0000 mg | ORAL_CAPSULE | Freq: Two times a day (BID) | ORAL | Status: DC
  Administered 2019-07-01 – 2019-07-03 (×5): 100 mg via ORAL
  Filled 2019-06-25 (×16): qty 1

## 2019-06-25 MED ORDER — CYCLOBENZAPRINE HCL 10 MG PO TABS
10.0000 mg | ORAL_TABLET | Freq: Three times a day (TID) | ORAL | Status: DC | PRN
  Administered 2019-06-26 – 2019-07-03 (×3): 10 mg via ORAL
  Filled 2019-06-25 (×3): qty 1

## 2019-06-25 MED ORDER — ZOLPIDEM TARTRATE 5 MG PO TABS
5.0000 mg | ORAL_TABLET | Freq: Every evening | ORAL | Status: DC | PRN
  Filled 2019-06-25: qty 1

## 2019-06-25 MED ORDER — ORAL CARE MOUTH RINSE: 15.0000 mL | Freq: Once | OROMUCOSAL | Status: AC

## 2019-06-25 MED ORDER — DEXAMETHASONE SODIUM PHOSPHATE 10 MG/ML IJ SOLN
INTRAMUSCULAR | Status: DC | PRN
Start: 2019-06-25 — End: 2019-06-25
  Administered 2019-06-25: 10 mg via INTRAVENOUS

## 2019-06-25 MED ORDER — CHLORHEXIDINE GLUCONATE CLOTH 2 % EX PADS
6.0000 | MEDICATED_PAD | Freq: Every day | CUTANEOUS | Status: DC
  Administered 2019-06-25 – 2019-06-29 (×5): 6 via TOPICAL

## 2019-06-25 MED ORDER — CHLORHEXIDINE GLUCONATE 0.12 % MT SOLN
15.0000 mL | Freq: Once | OROMUCOSAL | Status: AC
  Administered 2019-06-25: 15 mL via OROMUCOSAL
  Filled 2019-06-25: qty 15

## 2019-06-25 MED ORDER — PROPOFOL 10 MG/ML IV BOLUS
INTRAVENOUS | Status: AC
  Filled 2019-06-25: qty 40

## 2019-06-25 MED ORDER — SUCCINYLCHOLINE CHLORIDE 200 MG/10ML IV SOSY
PREFILLED_SYRINGE | INTRAVENOUS | Status: AC
  Filled 2019-06-25: qty 10

## 2019-06-25 MED ORDER — ACETAMINOPHEN 500 MG PO TABS
1000.0000 mg | ORAL_TABLET | Freq: Four times a day (QID) | ORAL | Status: AC
  Administered 2019-06-25 – 2019-06-26 (×4): 1000 mg via ORAL
  Filled 2019-06-25 (×4): qty 2

## 2019-06-25 MED ORDER — ALBUMIN HUMAN 5 % IV SOLN
INTRAVENOUS | Status: DC | PRN
Start: 2019-06-25 — End: 2019-06-25

## 2019-06-25 MED ORDER — THROMBIN 5000 UNITS EX SOLR
OROMUCOSAL | Status: DC | PRN
  Administered 2019-06-25 (×2): 5 mL via TOPICAL

## 2019-06-25 MED ORDER — PANTOPRAZOLE SODIUM 40 MG PO TBEC
40.0000 mg | DELAYED_RELEASE_TABLET | Freq: Every day | ORAL | Status: DC
  Administered 2019-06-26 – 2019-07-04 (×9): 40 mg via ORAL
  Filled 2019-06-25 (×9): qty 1

## 2019-06-25 MED ORDER — SUGAMMADEX SODIUM 500 MG/5ML IV SOLN
INTRAVENOUS | Status: DC | PRN
Start: 2019-06-25 — End: 2019-06-25
  Administered 2019-06-25: 300 mg via INTRAVENOUS

## 2019-06-25 MED ORDER — ONDANSETRON HCL 4 MG/2ML IJ SOLN
INTRAMUSCULAR | Status: AC
  Filled 2019-06-25: qty 2

## 2019-06-25 MED ORDER — LIDOCAINE-EPINEPHRINE 1 %-1:100000 IJ SOLN
INTRAMUSCULAR | Status: AC
  Filled 2019-06-25: qty 1

## 2019-06-25 MED ORDER — SODIUM CHLORIDE 0.9 % IV SOLN
250.0000 mL | INTRAVENOUS | Status: DC
  Administered 2019-06-25: 250 mL via INTRAVENOUS

## 2019-06-25 MED ORDER — LIDOCAINE-EPINEPHRINE 1 %-1:100000 IJ SOLN
INTRAMUSCULAR | Status: DC | PRN
  Administered 2019-06-25: 10 mL

## 2019-06-25 MED ORDER — BACITRACIN ZINC 500 UNIT/GM EX OINT
TOPICAL_OINTMENT | CUTANEOUS | Status: AC
  Filled 2019-06-25: qty 28.35

## 2019-06-25 MED ORDER — PHENYLEPHRINE HCL-NACL 10-0.9 MG/250ML-% IV SOLN
INTRAVENOUS | Status: DC | PRN
Start: 2019-06-25 — End: 2019-06-25
  Administered 2019-06-25: 25 ug/min via INTRAVENOUS

## 2019-06-25 MED ORDER — MIDAZOLAM HCL 2 MG/2ML IJ SOLN
INTRAMUSCULAR | Status: AC
  Filled 2019-06-25: qty 2

## 2019-06-25 MED ORDER — LACTATED RINGERS IV SOLN: INTRAVENOUS | Status: DC | PRN

## 2019-06-25 MED ORDER — ROCURONIUM BROMIDE 10 MG/ML (PF) SYRINGE
PREFILLED_SYRINGE | INTRAVENOUS | Status: DC | PRN
  Administered 2019-06-25: 30 mg via INTRAVENOUS
  Administered 2019-06-25: 20 mg via INTRAVENOUS
  Administered 2019-06-25: 10 mg via INTRAVENOUS
  Administered 2019-06-25: 40 mg via INTRAVENOUS
  Administered 2019-06-25: 20 mg via INTRAVENOUS
  Administered 2019-06-25: 60 mg via INTRAVENOUS
  Administered 2019-06-25: 20 mg via INTRAVENOUS

## 2019-06-25 MED ORDER — 0.9 % SODIUM CHLORIDE (POUR BTL) OPTIME
TOPICAL | Status: DC | PRN
  Administered 2019-06-25 (×3): 1000 mL

## 2019-06-25 MED ORDER — LIDOCAINE 2% (20 MG/ML) 5 ML SYRINGE
INTRAMUSCULAR | Status: AC
  Filled 2019-06-25: qty 5

## 2019-06-25 MED ORDER — HYDROMORPHONE HCL 1 MG/ML IJ SOLN
INTRAMUSCULAR | Status: AC
  Filled 2019-06-25: qty 1

## 2019-06-25 MED ORDER — SUGAMMADEX SODIUM 500 MG/5ML IV SOLN
INTRAVENOUS | Status: AC
  Filled 2019-06-25: qty 5

## 2019-06-25 MED ORDER — OXYCODONE HCL 5 MG PO TABS
10.0000 mg | ORAL_TABLET | ORAL | Status: DC | PRN
  Administered 2019-06-26 – 2019-07-03 (×14): 10 mg via ORAL
  Filled 2019-06-25 (×16): qty 2

## 2019-06-25 MED ORDER — HYDROMORPHONE HCL 1 MG/ML IJ SOLN
0.2500 mg | INTRAMUSCULAR | Status: DC | PRN
  Administered 2019-06-25 (×2): 0.5 mg via INTRAVENOUS

## 2019-06-25 MED ORDER — ROCURONIUM BROMIDE 10 MG/ML (PF) SYRINGE
PREFILLED_SYRINGE | INTRAVENOUS | Status: AC
  Filled 2019-06-25: qty 10

## 2019-06-25 MED ORDER — ONDANSETRON HCL 4 MG/2ML IJ SOLN
INTRAMUSCULAR | Status: DC | PRN
  Administered 2019-06-25: 4 mg via INTRAVENOUS

## 2019-06-25 MED ORDER — BISACODYL 10 MG RE SUPP
10.0000 mg | Freq: Every day | RECTAL | Status: DC | PRN
  Administered 2019-07-03: 10 mg via RECTAL
  Filled 2019-06-25: qty 1

## 2019-06-25 MED ORDER — ACETAMINOPHEN 650 MG RE SUPP: 650.0000 mg | RECTAL | Status: DC | PRN

## 2019-06-25 MED ORDER — ONDANSETRON HCL 4 MG PO TABS: 4.0000 mg | ORAL_TABLET | Freq: Four times a day (QID) | ORAL | Status: DC | PRN

## 2019-06-25 MED ORDER — VITAMIN B-12 1000 MCG PO TABS
1000.0000 ug | ORAL_TABLET | Freq: Every day | ORAL | Status: DC
  Administered 2019-06-25 – 2019-07-04 (×10): 1000 ug via ORAL
  Filled 2019-06-25 (×10): qty 1

## 2019-06-25 MED ORDER — MENTHOL 3 MG MT LOZG: 1.0000 | LOZENGE | OROMUCOSAL | Status: DC | PRN

## 2019-06-25 MED ORDER — HEMOSTATIC AGENTS (NO CHARGE) OPTIME
TOPICAL | Status: DC | PRN
  Administered 2019-06-25: 1 via TOPICAL

## 2019-06-25 MED ORDER — LIDOCAINE 2% (20 MG/ML) 5 ML SYRINGE
INTRAMUSCULAR | Status: DC | PRN
  Administered 2019-06-25: 80 mg via INTRAVENOUS

## 2019-06-25 MED ORDER — PHENYLEPHRINE 40 MCG/ML (10ML) SYRINGE FOR IV PUSH (FOR BLOOD PRESSURE SUPPORT)
PREFILLED_SYRINGE | INTRAVENOUS | Status: DC | PRN
  Administered 2019-06-25: 120 ug via INTRAVENOUS
  Administered 2019-06-25: 80 ug via INTRAVENOUS

## 2019-06-25 MED ORDER — THROMBIN 20000 UNITS EX SOLR
CUTANEOUS | Status: DC | PRN
  Administered 2019-06-25: 5 mL via TOPICAL

## 2019-06-25 MED ORDER — FENTANYL CITRATE (PF) 100 MCG/2ML IJ SOLN
INTRAMUSCULAR | Status: DC | PRN
  Administered 2019-06-25 (×7): 50 ug via INTRAVENOUS

## 2019-06-25 MED ORDER — SODIUM CHLORIDE 0.9% FLUSH
3.0000 mL | Freq: Two times a day (BID) | INTRAVENOUS | Status: DC
  Administered 2019-06-26 – 2019-07-04 (×10): 3 mL via INTRAVENOUS

## 2019-06-25 MED ORDER — SODIUM CHLORIDE 0.9% FLUSH
3.0000 mL | INTRAVENOUS | Status: DC | PRN
  Administered 2019-06-25: 3 mL via INTRAVENOUS

## 2019-06-25 MED ORDER — MORPHINE SULFATE (PF) 4 MG/ML IV SOLN: 4.0000 mg | INTRAVENOUS | Status: DC | PRN

## 2019-06-25 MED ORDER — SUCCINYLCHOLINE CHLORIDE 200 MG/10ML IV SOSY
PREFILLED_SYRINGE | INTRAVENOUS | Status: DC | PRN
  Administered 2019-06-25: 120 mg via INTRAVENOUS

## 2019-06-25 MED ORDER — CEFAZOLIN SODIUM 1 G IJ SOLR
INTRAMUSCULAR | Status: AC
  Filled 2019-06-25: qty 30

## 2019-06-25 MED ORDER — PHENOL 1.4 % MT LIQD: 1.0000 | OROMUCOSAL | Status: DC | PRN

## 2019-06-25 MED ORDER — SALINE SPRAY 0.65 % NA SOLN
1.0000 | Freq: Every day | NASAL | Status: DC | PRN
  Administered 2019-06-28: 1 via NASAL
  Filled 2019-06-25: qty 44

## 2019-06-25 SURGICAL SUPPLY — 78 items
BAG DECANTER FOR FLEXI CONT (MISCELLANEOUS) ×2 IMPLANT
BAND RUBBER #18 3X1/16 STRL (MISCELLANEOUS) IMPLANT
BASKET BONE COLLECTION (BASKET) ×2 IMPLANT
BENZOIN TINCTURE PRP APPL 2/3 (GAUZE/BANDAGES/DRESSINGS) ×2 IMPLANT
BLADE CLIPPER SURG (BLADE) IMPLANT
BUR MATCHSTICK NEURO 3.0 LAGG (BURR) ×2 IMPLANT
BUR PRECISION FLUTE 6.0 (BURR) ×2 IMPLANT
CAGE ALTERA 10X31X10-14 15D (Cage) ×2 IMPLANT
CAGE ALTERA 10X31X9-13 15D (Cage) ×2 IMPLANT
CANISTER SUCT 3000ML PPV (MISCELLANEOUS) ×2 IMPLANT
CAP LOCK DLX THRD (Cap) ×16 IMPLANT
CARTRIDGE OIL MAESTRO DRILL (MISCELLANEOUS) ×1 IMPLANT
CNTNR URN SCR LID CUP LEK RST (MISCELLANEOUS) ×1 IMPLANT
CONNECTOR CROSS 6.0-6.35X48-60 (Connector) ×2 IMPLANT
CONT SPEC 4OZ STRL OR WHT (MISCELLANEOUS) ×1
COVER BACK TABLE 60X90IN (DRAPES) ×2 IMPLANT
COVER WAND RF STERILE (DRAPES) ×2 IMPLANT
DECANTER SPIKE VIAL GLASS SM (MISCELLANEOUS) ×2 IMPLANT
DEVICE RISE INTERBODY CREO (Neuro Prosthesis/Implant) ×1 IMPLANT
DIFFUSER DRILL AIR PNEUMATIC (MISCELLANEOUS) ×2 IMPLANT
DRAPE C-ARM 42X72 X-RAY (DRAPES) ×8 IMPLANT
DRAPE HALF SHEET 40X57 (DRAPES) ×8 IMPLANT
DRAPE LAPAROTOMY 100X72X124 (DRAPES) ×2 IMPLANT
DRAPE MICROSCOPE LEICA (MISCELLANEOUS) IMPLANT
DRAPE SURG 17X23 STRL (DRAPES) ×8 IMPLANT
DRSG OPSITE POSTOP 4X8 (GAUZE/BANDAGES/DRESSINGS) ×2 IMPLANT
ELECT BLADE 4.0 EZ CLEAN MEGAD (MISCELLANEOUS) ×2
ELECT REM PT RETURN 9FT ADLT (ELECTROSURGICAL) ×2
ELECTRODE BLDE 4.0 EZ CLN MEGD (MISCELLANEOUS) ×1 IMPLANT
ELECTRODE REM PT RTRN 9FT ADLT (ELECTROSURGICAL) ×1 IMPLANT
GAUZE 4X4 16PLY RFD (DISPOSABLE) ×2 IMPLANT
GAUZE SPONGE 4X4 12PLY STRL (GAUZE/BANDAGES/DRESSINGS) ×2 IMPLANT
GLOVE BIO SURGEON STRL SZ8 (GLOVE) IMPLANT
GLOVE BIO SURGEON STRL SZ8.5 (GLOVE) ×4 IMPLANT
GLOVE EXAM NITRILE XL STR (GLOVE) IMPLANT
GLOVE SURG SS PI 6.5 STRL IVOR (GLOVE) ×2 IMPLANT
GLOVE SURG SS PI 7.5 STRL IVOR (GLOVE) ×6 IMPLANT
GLOVE SURG SS PI 8.5 STRL IVOR (GLOVE) ×2
GLOVE SURG SS PI 8.5 STRL STRW (GLOVE) ×2 IMPLANT
GOWN STRL REUS W/ TWL LRG LVL3 (GOWN DISPOSABLE) ×1 IMPLANT
GOWN STRL REUS W/ TWL XL LVL3 (GOWN DISPOSABLE) ×3 IMPLANT
GOWN STRL REUS W/TWL 2XL LVL3 (GOWN DISPOSABLE) IMPLANT
GOWN STRL REUS W/TWL LRG LVL3 (GOWN DISPOSABLE) ×1
GOWN STRL REUS W/TWL XL LVL3 (GOWN DISPOSABLE) ×3
HEMOSTAT POWDER KIT SURGIFOAM (HEMOSTASIS) ×4 IMPLANT
HEMOSTAT POWDER SURGIFOAM 1G (HEMOSTASIS) ×2 IMPLANT
KIT BASIN OR (CUSTOM PROCEDURE TRAY) ×2 IMPLANT
KIT INFUSE SMALL (Orthopedic Implant) ×2 IMPLANT
KIT TURNOVER KIT B (KITS) ×2 IMPLANT
MILL MEDIUM DISP (BLADE) ×2 IMPLANT
NEEDLE HYPO 21X1.5 SAFETY (NEEDLE) IMPLANT
NEEDLE HYPO 22GX1.5 SAFETY (NEEDLE) ×2 IMPLANT
NS IRRIG 1000ML POUR BTL (IV SOLUTION) ×6 IMPLANT
OIL CARTRIDGE MAESTRO DRILL (MISCELLANEOUS) ×2
PACK LAMINECTOMY NEURO (CUSTOM PROCEDURE TRAY) ×2 IMPLANT
PAD ARMBOARD 7.5X6 YLW CONV (MISCELLANEOUS) IMPLANT
PATTIES SURGICAL .5 X.5 (GAUZE/BANDAGES/DRESSINGS) ×2 IMPLANT
PATTIES SURGICAL .5 X1 (DISPOSABLE) IMPLANT
PATTIES SURGICAL 1X1 (DISPOSABLE) ×2 IMPLANT
PUTTY DBM 10CC CALC GRAN (Putty) ×4 IMPLANT
RISE INTERBODY CREO (Neuro Prosthesis/Implant) ×2 IMPLANT
ROD CURVED TI 6.35X90 (Rod) ×4 IMPLANT
SCREW PA DLX CREO 7.5X55 (Screw) ×16 IMPLANT
SEALANT ADHERUS EXTEND TIP (MISCELLANEOUS) ×2 IMPLANT
SPONGE LAP 4X18 RFD (DISPOSABLE) IMPLANT
SPONGE NEURO XRAY DETECT 1X3 (DISPOSABLE) ×2 IMPLANT
SPONGE SURGIFOAM ABS GEL 100 (HEMOSTASIS) ×2 IMPLANT
STRIP CLOSURE SKIN 1/2X4 (GAUZE/BANDAGES/DRESSINGS) ×2 IMPLANT
SUT VIC AB 1 CT1 18XBRD ANBCTR (SUTURE) ×2 IMPLANT
SUT VIC AB 1 CT1 8-18 (SUTURE) ×2
SUT VIC AB 2-0 CP2 18 (SUTURE) ×4 IMPLANT
SYR 20ML LL LF (SYRINGE) ×2 IMPLANT
TOWEL GREEN STERILE (TOWEL DISPOSABLE) ×2 IMPLANT
TOWEL GREEN STERILE FF (TOWEL DISPOSABLE) ×2 IMPLANT
TRAY FOL W/BAG SLVR 16FR STRL (SET/KITS/TRAYS/PACK) ×1 IMPLANT
TRAY FOLEY MTR SLVR 16FR STAT (SET/KITS/TRAYS/PACK) IMPLANT
TRAY FOLEY W/BAG SLVR 16FR LF (SET/KITS/TRAYS/PACK) ×1
WATER STERILE IRR 1000ML POUR (IV SOLUTION) ×2 IMPLANT

## 2019-06-25 NOTE — Op Note (Signed)
Brief history:The patient is any 69 year old morbidly obese white female who has complained back and right greater left leg pain consistent with lumbar radiculopathy/ neurogenic claudication.  She failed medical management and was worked up with a lumbar MRI which demonstrated lumbar spondylolisthesis, spinal stenosis, disc degeneration, adult degenerative scoliosis, etcetera.  I discussed the various treatment options with the patient.  She has decided to proceed with surgery after weighing the risks, benefits and alternatives.  Preoperative diagnosis:  Adult degenerative scoliosis, lumbar spondylolisthesis,Degenerative disc disease, spinal stenosis compressing the L3, L4 and  L5 nerve roots; lumbago; lumbar radiculopathy; neurogenic claudication  Postoperative diagnosis: the same  Procedure: L3-4 and L4-5 laminectomy, bilateral L2-3 Laminotomy/foraminotomies/medial facetectomy to decompress the bilateral L3, L4 and L5 nerve roots(the work required to do this was in addition to the work required to do the posterior lumbar interbody fusion because of the patient's  Severe spinal stenosis, facet arthropathy. Etc. requiring a wide decompression of the nerve roots.); L2-3, L3-4 and L4-5 transforaminal lumbar interbody fusion with bone morphogenic protein soaked collagen sponges, local morselized autograft bone and Zimmer DBM; insertion of interbody prosthesis at L2-3, L3-4 and L4-5(globus peek expandable interbody prosthesis); posterior segmental instrumentation from L2 to L5with globus titanium pedicle screws and rods; posterior lateral arthrodesis at L2-3, L3-4 and L4-5 with bone morphogenic protein soaked collagen sponges, local morselized autograft bone and Zimmer DBM.  Surgeon: Dr. Earle Gell  Asst.: Arnetha Massy, NP  Anesthesia: Gen. endotracheal  Estimated blood loss: 700 cc  Drains: None  Complications: None  Description of procedure: The patient was brought to the operating room by the  anesthesia team. General endotracheal anesthesia was induced. The patient was turned to the prone position on the Wilson frame. The patient's lumbosacral region was then prepared with Betadine scrub and Betadine solution. Sterile drapes were applied.  I then injected the area to be incised with Marcaine with epinephrine solution. I then used the scalpel to make a linear midline incision over the L2-3, L3-4 and L4-5interspace. I then used electrocautery to perform a bilateral subperiosteal dissection exposing the spinous process and lamina of L2-3, L3-4 and L4-5. We then obtained intraoperative radiograph to confirm our location. We then inserted the Verstrac retractor to provide exposure.  I began the decompression by using the Leksell rongeur was to remove the spinous process at L3 and L4.  We use the high speed drill to perform laminotomies at L2-3, L3-4 and L4-5 bilaterally. We then used the Kerrison punches to widen the laminotomy and removed the ligamentum flavum at L2-3, L3-4 and L4-5 bilaterally. We used the Kerrison punches to remove the medial facets at L2-3, L3-4 and L4-5 bilaterally. We performed wide foraminotomies about the bilateral L3, L4 and L5nerve roots completing the decompression. Of note the lamina at L2-3 on the left was extremely adherent to the dura.  In doing the partial laminectomy we did create a small durotomy.  There was some arachnoid shown but no spinal fluid leak.  We now turned our attention to the posterior lumbar interbody fusion. I used a scalpel to incise the intervertebral disc at  L2-3, L3-4 and L4-5. I then performed a partial intervertebral discectomy at L2-3, L3-4 and L4-5 using the pituitary forceps. We prepared the vertebral endplates at Z6-0, F0-9 and L4-5 for the fusion by removing the soft tissues with the curettes. We then used the trial spacers to pick the appropriate sized interbody prosthesis. We prefilled his prosthesis with a combination of local morselized  autograft bone that  we obtained during the decompression as well as Zimmer DBM. We inserted the prefilled prosthesis into the interspace at L3-4 and L4-5 from the right, we then turned and expanded the prosthesis.  We placed a interbody prosthesis at L2-3 on the right without turning it.There was a good snug fit of the prosthesis in the interspace. We then filled and the remainder of the intervertebral disc space with  Bone morphogenic protein soaked collagen sponges, local morselized autograft bone and Zimmer DBM. This completed the posterior lumbar interbody arthrodesis.  During the decompression and insertion of the prosthesis the assistant protected the thecal sac and nerve roots with the D'Errico retractor.  We now turned attention to the instrumentation. Under fluoroscopic guidance we cannulated the bilateral L2, L3, L4 and L5pedicles with the bone probe. We then removed the bone probe. We then tapped the pedicle with a 6.5 millimeter tap. We then removed the tap. We probed inside the tapped pedicle with a ball probe to rule out cortical breaches. We then inserted a 7.5 x 55 millimeter pedicle screw into the  L2, L3, L4 and L5 pedicles bilaterally under fluoroscopic guidance. We then palpated along the medial aspect of the pedicles to rule out cortical breaches. There were none. The nerve roots were not injured. We then connected the unilateral pedicle screws with a lordotic rod. We compressed the construct and secured the rod in place with the caps. We then tightened the caps appropriately. This completed the instrumentation from L2-L5 bilaterally.  We now turned our attention to the posterior lateral arthrodesis at L2-3, L3-4 and L4-5 bilaterally. We used the high-speed drill to decorticate the remainder of the facets, pars, transverse process at L2-3, L3-4 and L4-5 bilaterally. We then applied a combination of  Bone morphogenic protein soaked collagen sponges,local morselized autograft bone and Zimmer  DBM over these decorticated posterior lateral structures. This completed the posterior lateral arthrodesis.  We then obtained hemostasis using bipolar electrocautery. We irrigated the wound out with bacitracin solution. We inspected the thecal sac and nerve roots and noted they were well decompressed. I placed DuraSeal over the small durotomy. We then removed the retractor.   We reapproximated patient's thoracolumbar fascia with interrupted #1 Vicryl suture. We reapproximated patient's subcutaneous tissue with interrupted 2-0 Vicryl suture. The reapproximated patient's skin with Steri-Strips and benzoin. The wound was then coated with bacitracin ointment. A sterile dressing was applied. The drapes were removed. The patient was subsequently returned to the supine position where they were extubated by the anesthesia team. He was then transported to the post anesthesia care unit in stable condition. All sponge instrument and needle counts were reportedly correct at the end of this case.

## 2019-06-25 NOTE — Anesthesia Procedure Notes (Signed)
Procedure Name: Intubation Date/Time: 06/25/2019 7:54 AM Performed by: Candis Shine, CRNA Pre-anesthesia Checklist: Patient identified, Emergency Drugs available, Suction available and Patient being monitored Patient Re-evaluated:Patient Re-evaluated prior to induction Oxygen Delivery Method: Circle System Utilized Preoxygenation: Pre-oxygenation with 100% oxygen Induction Type: IV induction and Rapid sequence Laryngoscope Size: Glidescope and 4 Grade View: Grade I Tube type: Oral Tube size: 7.0 mm Number of attempts: 1 Airway Equipment and Method: Video-laryngoscopy and Rigid stylet Placement Confirmation: ETT inserted through vocal cords under direct vision,  positive ETCO2 and breath sounds checked- equal and bilateral Secured at: 22 cm Tube secured with: Tape Dental Injury: Teeth and Oropharynx as per pre-operative assessment  Comments: Elective glidescope intubation d/t previous neck surgery

## 2019-06-25 NOTE — Progress Notes (Signed)
Orthopedic Tech Progress Note Patient Details:  Selena Rodgers [birth year] [id number] Called in brace. Patient ID: Selena Rodgers, female   DOB: 08-15-1950, 69 y.o.   MRN: [id number]   Selena Rodgers 06/25/2019, 8:26 PM

## 2019-06-25 NOTE — H&P (Signed)
Subjective: The patient is a 69 year old morbidly obese white female with complaint of back and leg pain consistent with neurogenic claudication.  She has failed medical management.  She was worked up with a lumbar MRI which demonstrated lumbar spondylolisthesis and severe spinal stenosis.  I discussed the various treatment options with her.  She has decided proceed with surgery.  Past Medical History:  Diagnosis Date  . Anxiety   . Arthritis   . Chronic back pain   . Spinal stenosis   . Umbilical hernia   . Ventral hernia     Past Surgical History:  Procedure Laterality Date  . ANTERIOR CERVICAL DECOMP/DISCECTOMY FUSION N/A 04/19/2015   Procedure: ANTERIOR CERVICAL DECOMPRESSION/DISCECTOMY FUSION INTERBODY PROTHESIS PLATING BONEGRAFT CERVICAL THREE-FOUR ,CERVICAL FOUR-FIVE;  Surgeon: Newman Pies, MD;  Location: [facility];  Service: Neurosurgery;  Laterality: N/A;  . BREAST BIOPSY    . CARPAL TUNNEL RELEASE    . CHOLECYSTECTOMY    . FOOT SURGERY Right   . IRRIGATION AND DEBRIDEMENT ABDOMEN  01/10/2014   Dr. Marina Gravel  . UMBILICAL HERNIA REPAIR    . VENTRAL HERNIA REPAIR  12/26/2013   Dr. Pat Patrick    Allergies  Allergen Reactions  . Chlorpheniramine Anaphylaxis  . Linseed Oil Anaphylaxis  . Codeine Hives  . Flax Seed Oil [Bio-Flax] Swelling    UNSPECIFIED   . Latex Hives and Other (See Comments)    BLISTERS  . Tape Other (See Comments)    BLISTERS  . Tapentadol Other (See Comments)    BLISTERS  . 2,4-D Dimethylamine (Amisol) Rash    T-DAP  . Aspirin Rash    Social History   Tobacco Use  . Smoking status: Current Some Day Smoker  . Smokeless tobacco: Never Used  . Tobacco comment: 5 cigs daily  Substance Use Topics  . Alcohol use: No    Alcohol/week: 0.0 standard drinks    Family History  Problem Relation Age of Onset  . Diabetes Maternal Grandmother   . Diabetes Maternal Grandfather   . Diabetes Paternal Grandmother   . Diabetes Paternal Grandfather   .  Hypertension Mother    Prior to Admission medications   Medication Sig Start Date End Date Taking? Authorizing Provider  acetaminophen (TYLENOL) 650 MG CR tablet Take 1,300 mg by mouth in the morning and at bedtime.   Yes [provider]  citalopram (CELEXA) 40 MG tablet Take 1 tablet (40 mg total) by mouth daily. Patient taking differently: Take 20 mg by mouth daily.  04/28/15  Yes Angiulli, Lavon Paganini, PA-C  meloxicam (MOBIC) 15 MG tablet Take 15 mg by mouth daily. 03/28/19  Yes [provider]  methocarbamol (ROBAXIN) 750 MG tablet Take 750 mg by mouth every 8 (eight) hours as needed for muscle spasms.  04/18/19  Yes [provider]  omeprazole (PRILOSEC) 40 MG capsule Take 40 mg by mouth daily.    Yes [provider]  sodium chloride (OCEAN) 0.65 % SOLN nasal spray Place 1 spray into both nostrils daily as needed for congestion (dry nose).   Yes [provider]  traMADol (ULTRAM) 50 MG tablet Take 50 mg by mouth every 6 (six) hours as needed. 06/05/19  Yes [provider]  vitamin B-12 (CYANOCOBALAMIN) 1000 MCG tablet Take 1,000 mcg by mouth daily.   Yes [provider]     Review of Systems  Positive ROS: As above  All other systems have been reviewed and were otherwise negative with the exception of those mentioned  in the HPI and as above.  Objective: Vital signs in last 24 hours: Temp:  [97.7 F (36.5 C)] 97.7 F (36.5 C) (06/23 0652) Pulse Rate:  [68] 68 (06/23 0652) Resp:  [18] 18 (06/23 0652) BP: (179)/(57) 179/57 (06/23 0652) SpO2:  [98 %] 98 % (06/23 0652) Estimated body mass index is 41.97 kg/m as calculated from the following:   Height as of 06/23/19: 5\' 7"  (1.702 m).   Weight as of 06/23/19: 121.6 kg.   General Appearance: Alert, morbidly obese Head: Normocephalic, without obvious abnormality, atraumatic Eyes: PERRL, conjunctiva/corneas clear, EOM's intact,    Ears: Normal  Throat: Normal  Neck: Unremarkable  cervical incision is well-healed. Back: unremarkable Lungs: Clear to auscultation bilaterally, respirations unlabored Heart: Regular rate and rhythm, no murmur, rub or gallop Abdomen: Soft, non-tender, morbidly obese Extremities: Extremities normal, atraumatic, no cyanosis or edema Skin: unremarkable  NEUROLOGIC:   Mental status: alert and oriented,Motor Exam - grossly normal Sensory Exam - grossly normal Reflexes:  Coordination - grossly normal Gait - grossly normal Balance - grossly normal Cranial Nerves: I: smell Not tested  II: visual acuity  OS: Normal  OD: Normal   II: visual fields Full to confrontation  II: pupils Equal, round, reactive to light  III,VII: ptosis None  III,IV,VI: extraocular muscles  Full ROM  V: mastication Normal  V: facial light touch sensation  Normal  V,VII: corneal reflex  Present  VII: facial muscle function - upper  Normal  VII: facial muscle function - lower Normal  VIII: hearing Not tested  IX: soft palate elevation  Normal  IX,X: gag reflex Present  XI: trapezius strength  5/5  XI: sternocleidomastoid strength 5/5  XI: neck flexion strength  5/5  XII: tongue strength  Normal    Data Review Lab Results  Component Value Date   WBC 9.8 06/23/2019   HGB 14.1 06/23/2019   HCT 42.8 06/23/2019   MCV 93.2 06/23/2019   PLT 259 06/23/2019   Lab Results  Component Value Date   NA 137 06/23/2019   K 4.3 06/23/2019   CL 103 06/23/2019   CO2 23 06/23/2019   BUN 12 06/23/2019   CREATININE 0.61 06/23/2019   GLUCOSE 97 06/23/2019   No results found for: INR, PROTIME  Assessment/Plan: L2-3, L3-4 and L4-5 spondylolisthesis, spinal stenosis, lumbago, lumbar radiculopathy, neurogenic claudication: I have discussed the situation with the patient.  I have reviewed the diagnostic studies with her and pointed out the abnormalities.  We have discussed the various treatment options including surgery.  I described the surgical treatment option for an  history, instrumentation and fusion.  I have shown her surgical models.  We have discussed the risks, benefits, alternatives, expected course, and likelihood of achieving our goals with surgery.  I have answered all her questions.  She has decided to proceed with surgery.   Ophelia Charter 06/25/2019 7:24 AM

## 2019-06-25 NOTE — Anesthesia Postprocedure Evaluation (Signed)
Anesthesia Post Note  Patient: PATRICK SOHM  Procedure(s) Performed: POSTERIOR LUMBAR INTERBODY FUSION, DECOMPRESSIVE LAMINECTOMY, POSTERIOR INSTRUMENTATION LUMBAR TWO- LUMBAR THREE, LUMBAR THREE- LUMBAR FOUR, LUMBAR FOUR- LUMBAR FIVE (N/A )     Patient location during evaluation: PACU Anesthesia Type: General Level of consciousness: awake Pain management: pain level controlled Vital Signs Assessment: post-procedure vital signs reviewed and stable Respiratory status: spontaneous breathing Cardiovascular status: stable Postop Assessment: no apparent nausea or vomiting Anesthetic complications: no   No complications documented.  Last Vitals:  Vitals:   06/25/19 1700 06/25/19 1715  BP: 137/65 (!) 145/60  Pulse: 77 76  Resp: 16 17  Temp:    SpO2: 97% 97%    Last Pain:  Vitals:   06/25/19 1700  TempSrc:   PainSc: 8                  Nagi Furio

## 2019-06-25 NOTE — Progress Notes (Signed)
   Providing Compassionate, Quality Care - Together   Subjective: Patient reports feeling "cramps" in her back. She denies numbness or tingling of her lower extremities.  Objective: Vital signs in last 24 hours: Temp:  [97.7 F (36.5 C)-97.9 F (36.6 C)] 97.9 F (36.6 C) (06/23 1515) Pulse Rate:  [68-80] 76 (06/23 1530) Resp:  [18-20] 19 (06/23 1530) BP: (119-179)/(57-75) 119/75 (06/23 1530) SpO2:  [94 %-98 %] 94 % (06/23 1530)  Intake/Output from previous day: No intake/output data recorded. Intake/Output this shift: Total I/O In: 4320 [I.V.:3500; Blood:320; IV Piggyback:500] Out: 965 [Urine:265; Blood:700]  Alert and oriented x 4 PERRLA CN II-XII grossly intact MAE, Strength and sensation at baseline Incision is covered with Honeycomb dressing and Steri Strips; Dressing is clean, dry, and intact   Lab Results: Recent Labs    06/23/19 1319  WBC 9.8  HGB 14.1  HCT 42.8  PLT 259   BMET Recent Labs    06/23/19 1319  NA 137  K 4.3  CL 103  CO2 23  GLUCOSE 97  BUN 12  CREATININE 0.61  CALCIUM 9.4    Studies/Results: No results found.  Assessment/Plan: Patient underwent an L2-3, L3-4, and L4-5 posterior fusion by [name]. She is recovering in the PACU presently. She will be admitted to the hospital where she can mobilize with therapies.   LOS: 0 days     Viona Gilmore, DNP, AGNP-C Nurse Practitioner  [facility] [address], [place], [place] [address] P: [phone]    F: [phone]  06/25/2019, 3:50 PM

## 2019-06-25 NOTE — Anesthesia Preprocedure Evaluation (Signed)
Anesthesia Evaluation  Patient identified by MRN, date of birth, ID band Patient awake    Reviewed: Allergy & Precautions, NPO status , Patient's Chart, lab work & pertinent test results  Airway Mallampati: II  TM Distance: >3 FB     Dental   Pulmonary neg pulmonary ROS, Current Smoker,    breath sounds clear to auscultation       Cardiovascular hypertension,  Rhythm:Regular Rate:Normal     Neuro/Psych    GI/Hepatic negative GI ROS, Neg liver ROS,   Endo/Other  negative endocrine ROS  Renal/GU negative Renal ROS     Musculoskeletal   Abdominal   Peds  Hematology   Anesthesia Other Findings   Reproductive/Obstetrics                             Anesthesia Physical Anesthesia Plan  ASA: II  Anesthesia Plan: General   Post-op Pain Management:    Induction: Intravenous  PONV Risk Score and Plan: 2 and Ondansetron, Dexamethasone and Midazolam  Airway Management Planned: Oral ETT  Additional Equipment:   Intra-op Plan:   Post-operative Plan: Possible Post-op intubation/ventilation  Informed Consent: I have reviewed the patients History and Physical, chart, labs and discussed the procedure including the risks, benefits and alternatives for the proposed anesthesia with the patient or authorized representative who has indicated his/her understanding and acceptance.     Dental advisory given  Plan Discussed with: Anesthesiologist and CRNA  Anesthesia Plan Comments:         Anesthesia Quick Evaluation

## 2019-06-25 NOTE — Transfer of Care (Signed)
Immediate Anesthesia Transfer of Care Note  Patient: MARVENA TALLY  Procedure(s) Performed: POSTERIOR LUMBAR INTERBODY FUSION, DECOMPRESSIVE LAMINECTOMY, POSTERIOR INSTRUMENTATION LUMBAR TWO- LUMBAR THREE, LUMBAR THREE- LUMBAR FOUR, LUMBAR FOUR- LUMBAR FIVE (N/A )  Patient Location: PACU  Anesthesia Type:General  Level of Consciousness: drowsy  Airway & Oxygen Therapy: Patient Spontanous Breathing and Patient connected to face mask oxygen  Post-op Assessment: Report given to RN and Post -op Vital signs reviewed and stable  Post vital signs: Reviewed and stable  Last Vitals:  Vitals Value Taken Time  BP 123/69 06/25/19 1515  Temp 36.6 C 06/25/19 1515  Pulse 77 06/25/19 1522  Resp 19 06/25/19 1522  SpO2 100 % 06/25/19 1522  Vitals shown include unvalidated device data.  Last Pain:  Vitals:   06/25/19 1515  TempSrc:   PainSc: 5       Patients Stated Pain Goal: 3 (32/67/12 4580)  Complications: No complications documented.

## 2019-06-26 LAB — BASIC METABOLIC PANEL
Anion gap: 8 (ref 5–15)
BUN: 13 mg/dL (ref 8–23)
CO2: 24 mmol/L (ref 22–32)
Calcium: 8.5 mg/dL — ABNORMAL LOW (ref 8.9–10.3)
Chloride: 103 mmol/L (ref 98–111)
Creatinine, Ser: 0.75 mg/dL (ref 0.44–1.00)
GFR calc Af Amer: >60 mL/min (ref >60)
GFR calc non Af Amer: >60 mL/min (ref >60)
Glucose, Bld: 157 mg/dL — ABNORMAL HIGH (ref 70–99)
Potassium: 4.4 mmol/L (ref 3.5–5.1)
Sodium: 135 mmol/L (ref 135–145)

## 2019-06-26 LAB — CBC
HCT: 32 % — ABNORMAL LOW (ref 36.0–46.0)
Hemoglobin: 10.4 g/dL — ABNORMAL LOW (ref 12.0–15.0)
MCH: 30.4 pg (ref 26.0–34.0)
MCHC: 32.5 g/dL (ref 30.0–36.0)
MCV: 93.6 fL (ref 80.0–100.0)
Platelets: 170 10*3/uL (ref 150–400)
RBC: 3.42 MIL/uL — ABNORMAL LOW (ref 3.87–5.11)
RDW: 13 % (ref 11.5–15.5)
WBC: 13.1 10*3/uL — ABNORMAL HIGH (ref 4.0–10.5)
nRBC: 0 % (ref 0.0–0.2)

## 2019-06-26 MED FILL — Thrombin For Soln 5000 Unit: CUTANEOUS | Qty: 5000 | Status: AC

## 2019-06-26 NOTE — NC FL2 (Signed)
[place] LEVEL OF CARE SCREENING TOOL     IDENTIFICATION  Patient Name: Selena Rodgers Birthdate: [birth year] Sex: female Admission Date (Current Location): 06/25/2019  [facility] [place] Number:  Engineering geologist and Address:  [facility], [place] [address], [place], [place] [address]      Provider Number: [id number]  Attending Physician Name and Address:  Newman Pies, MD  Relative Name and Phone Number:       Current Level of Care: Hospital Recommended Level of Care: [place] Prior Approval Number:    Date Approved/Denied:   PASRR Number: [id number]  Discharge Plan: SNF    Current Diagnoses: Patient Active Problem List   Diagnosis Date Noted  . Spondylolisthesis, lumbar region 06/25/2019  . Adjustment disorder with mixed anxiety and depressed mood   . Slow transit constipation   . Myelopathy ([place]) 04/21/2015  . Cervical myelopathy ([place])   . Constipation due to pain medication   . Depression   . Primary osteoarthritis of right shoulder   . S/P spinal surgery   . Abnormality of gait   . Weakness   . Other secondary hypertension   . Post-operative pain   . Arthritis   . Cervical spondylosis with myelopathy 04/19/2015  . 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 Ventral hernia 10/07/2014  . Current tobacco use 10/06/2014  . Anxiety 09/17/2014  . Back pain, chronic 09/17/2014  . Clinical depression 09/17/2014  . BP (high blood pressure) 09/17/2014  . Borderline diabetes 09/17/2014  . Arthritis, degenerative 09/17/2014  . Spinal stenosis 09/17/2014  . Compulsive tobacco user syndrome 09/17/2014  . Abdominal mass 06/30/2014  . Incisional hernia, without obstruction or gangrene 06/30/2014  . Morbid obesity ([place]) 06/30/2014  . Hypercholesteremia 05/23/2013  . 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 Hypercholesterolemia 05/23/2013    Orientation RESPIRATION BLADDER Height & Weight     Self, Time,  Situation, Place  Normal Continent Weight:   Height:     BEHAVIORAL SYMPTOMS/MOOD NEUROLOGICAL BOWEL NUTRITION STATUS      Continent Diet (regular)  AMBULATORY STATUS COMMUNICATION OF NEEDS Skin   Extensive Assist Verbally Surgical wounds (closed back incision, honeycomb dressing)                       Personal Care Assistance Level of Assistance  Bathing, Feeding, Dressing Bathing Assistance: Maximum assistance Feeding assistance: Limited assistance Dressing Assistance: Maximum assistance     Functional Limitations Info             SPECIAL CARE FACTORS FREQUENCY  PT (By licensed PT), OT (By licensed OT)     PT Frequency: 5x/wk OT Frequency: 5x/wk            Contractures Contractures Info: Not present    Additional Factors Info  Code Status, Allergies, Psychotropic Code Status Info: Full Allergies Info: Chlorpheniramine, Linseed Oil, Codeine, Flax Seed Oil (Bio-flax), Latex, Tape, Tapentadol, 2,4-d Dimethylamine (Amisol), Aspirin Psychotropic Info: Celexa 20mg  daily         Current Medications (06/26/2019):  This is the current hospital active medication list Current Facility-Administered Medications  Medication Dose Route Frequency Provider Last Rate Last Admin  . 0.9 %  sodium chloride infusion  250 mL Intravenous Continuous Viona Gilmore D, NP 1 mL/hr at 06/25/19 2114 250 mL at 06/25/19 2114  . acetaminophen (TYLENOL) tablet 650 mg  650 mg Oral Q4H PRN Marye Nettle, NP       Or  .  acetaminophen (TYLENOL) suppository 650 mg  650 mg Rectal Q4H PRN Bergman, Meghan D, NP      . bisacodyl (DULCOLAX) suppository 10 mg  10 mg Rectal Daily PRN Viona Gilmore D, NP      . Chlorhexidine Gluconate Cloth 2 % PADS 6 each  6 each Topical Daily Newman Pies, MD   6 each at 06/26/19 1020  . citalopram (CELEXA) tablet 20 mg  20 mg Oral Daily Bergman, Meghan D, NP   20 mg at 06/26/19 1019  . cyclobenzaprine (FLEXERIL) tablet 10 mg  10 mg Oral TID PRN Viona Gilmore D, NP   10 mg at 06/26/19 0059  . docusate sodium (COLACE) capsule 100 mg  100 mg Oral BID Bergman, Meghan D, NP      . menthol-cetylpyridinium (CEPACOL) lozenge 3 mg  1 lozenge Oral PRN Bergman, Meghan D, NP       Or  . phenol (CHLORASEPTIC) mouth spray 1 spray  1 spray Mouth/Throat PRN Bergman, Meghan D, NP      . morphine 4 MG/ML injection 4 mg  4 mg Intravenous Q2H PRN Bergman, Meghan D, NP      . ondansetron (ZOFRAN) tablet 4 mg  4 mg Oral Q6H PRN Bergman, Meghan D, NP       Or  . ondansetron (ZOFRAN) injection 4 mg  4 mg Intravenous Q6H PRN Bergman, Meghan D, NP      . oxyCODONE (Oxy IR/ROXICODONE) immediate release tablet 10 mg  10 mg Oral Q3H PRN Viona Gilmore D, NP   10 mg at 06/26/19 1019  . oxyCODONE (Oxy IR/ROXICODONE) immediate release tablet 5 mg  5 mg Oral Q3H PRN Bergman, Meghan D, NP      . pantoprazole (PROTONIX) EC tablet 40 mg  40 mg Oral Daily Bergman, Meghan D, NP   40 mg at 06/26/19 1019  . sodium chloride (OCEAN) 0.65 % nasal spray 1 spray  1 spray Each Nare Daily PRN Bergman, Meghan D, NP      . sodium chloride flush (NS) 0.9 % injection 3 mL  3 mL Intravenous Q12H Bergman, Meghan D, NP   3 mL at 06/26/19 1021  . sodium chloride flush (NS) 0.9 % injection 3 mL  3 mL Intravenous PRN Viona Gilmore D, NP   3 mL at 06/25/19 2121  . vitamin B-12 (CYANOCOBALAMIN) tablet 1,000 mcg  1,000 mcg Oral Daily Bergman, Meghan D, NP   1,000 mcg at 06/26/19 1019  . zolpidem (AMBIEN) tablet 5 mg  5 mg Oral QHS PRN Lakin Nettle, NP         Discharge Medications: Please see discharge summary for a list of discharge medications.  Relevant Imaging Results:  Relevant Lab Results:   Additional Information SS#: [id number]  Geralynn Ochs, LCSW

## 2019-06-26 NOTE — Evaluation (Signed)
Physical Therapy Evaluation Patient Details Name: Selena Rodgers MRN: [id number] DOB: 1950/12/06 Today's Date: 06/26/2019   History of Present Illness  69 yo female s/p L2-5 transforaminal lumbar interbody fusion on 6/23. PMH includes ACDF, obesity, cholecystectomy, ventral hernia.  Clinical Impression   Pt presents with LE weakness R>L, impaired sitting and standing balance with history and fear of falling, severe back pain, decreased knowledge of back precautions post-operatively, significant difficulty perform mobility tasks, and inability to ambulate this day due to weakness and fatigue. Pt to benefit from acute PT to address deficits. Pt required mod-max +2 for bed mobility and transfer to stand, requiring use of bariatric stedy in order to safely stand and transfer to [facility] and recliner. Pt recommending SNF level of care post-acutely, per pt she went to [facility] previously and does not want to go back. PT to progress mobility as tolerated, and will continue to follow acutely.      Follow Up Recommendations SNF;Supervision/Assistance - 24 hour    Equipment Recommendations  None recommended by PT    Recommendations for Other Services       Precautions / Restrictions Precautions Precautions: Fall;Back Precaution Booklet Issued: Yes (comment) Precaution Comments: OT provided - PT verbally reviewed no bending, lifting, twisting spine, as well as log roll in and out of bed Required Braces or Orthoses: Spinal Brace Spinal Brace: Lumbar corset;Applied in sitting position Restrictions Weight Bearing Restrictions: No      Mobility  Bed Mobility Overal bed mobility: Needs Assistance Bed Mobility: Rolling;Sidelying to Sit Rolling: Mod assist Sidelying to sit: Max assist;+2 for physical assistance;HOB elevated;+2 for safety/equipment       General bed mobility comments: mod assist for rolling towards L for trunk and LE translation, cuing for use of bedrails to assist. Max +2 for  sidelying to sit for trunk elevation, LE management, pt reaching for pt's shoulders and neck to steady self upon sitting (encouraged not to do this).  Transfers Overall transfer level: Needs assistance Equipment used: Rolling walker (2 wheeled);Ambulation equipment used Transfers: Sit to/from Stand Sit to Stand: Max assist;+2 physical assistance;+2 safety/equipment;From elevated surface         General transfer comment: Max +2 for trunk elevation, hip extension via posterior facilitation, trunk extension via sternal facilitation, and steadying. Pt unsafe and unsteady with use of RW and unable to come to full standing, more successful standing with use of bari stedy and very elevated bed height. 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) Sitting-balance support: Bilateral upper extremity supported;Feet supported Sitting balance-Leahy Scale: Fair     Standing balance support: Bilateral upper extremity supported;During functional activity Standing balance-Leahy Scale: Zero Standing balance comment: requires max +2 to stand                             Pertinent Vitals/Pain Pain Assessment: 0-10 Pain Score: 8  Pain Location: back Pain Descriptors / Indicators: Aching;Burning Pain Intervention(s): Monitored during session;Limited activity within patient's tolerance;Repositioned;Premedicated before session    Home Living Family/patient expects to be discharged to:: Private residence Living Arrangements: Alone;Other relatives Available  Help at Discharge: Available PRN/intermittently (unsure if she has more help) Type of Home: House Home Access: Ramped entrance     Home Layout: One level Home  Equipment: Grab bars - toilet;Grab bars - tub/shower;Hand held shower head;Shower seat;Bedside commode;Adaptive equipment;Walker - 4 wheels      Prior Function Level of Independence: Independent with assistive device(s)         Comments: Has been using a RW since 2017; family assists with IADL tasks however pt did her own launndry and cooking; driving until the last month     Hand Dominance   Dominant Hand: Right    Extremity/Trunk Assessment   Upper Extremity Assessment Upper Extremity Assessment: Defer to OT evaluation    Lower Extremity Assessment Lower Extremity Assessment: Generalized weakness;RLE deficits/detail RLE Deficits / Details: Formal MMT not assessed, + R knee buckling in WB improved with use of bari stedy RLE: Unable to fully assess due to pain    Cervical / Trunk Assessment Cervical / Trunk Assessment: Other exceptions (back sx; hernia)  Communication   Communication: No difficulties  Cognition Arousal/Alertness: Awake/alert Behavior During Therapy: Anxious (fear of falling) Overall Cognitive Status: Within Functional Limits for tasks assessed Area of Impairment: Safety/judgement;Problem solving                         Safety/Judgement: Decreased awareness of safety   Problem Solving: Requires verbal cues;Requires tactile cues;Difficulty sequencing General Comments: Pt very fearful of falling, reaching for PT and environment frantically with transitional movements. Pt with poor safety awareness during mobility, stating "I hold onto the walker when I stand, sissy holds it still" and cannot stand without pulling up on RW. Multimodal cuing throughout session for safety.      General Comments General comments (skin integrity, edema, etc.): pt reports feeling dizzy vs lightheaded upon sitting EOB, BP and HR 141/59 (83) and 93 bpm. Suspect due to pain medications    Exercises     Assessment/Plan    PT Assessment Patient needs continued PT  services  PT Problem List Decreased strength;Decreased mobility;Decreased safety awareness;Decreased activity tolerance;Decreased balance;Decreased knowledge of use of DME;Pain;Obesity;Decreased coordination       PT Treatment Interventions DME instruction;Therapeutic activities;Gait training;Therapeutic exercise;Patient/family education;Balance training;Functional mobility training;Neuromuscular re-education    PT Goals (Current goals can be found in the Care Plan section)  Acute Rehab PT Goals Patient Stated Goal: decrease pain with mobility PT Goal Formulation: With patient Time For Goal Achievement: 07/10/19 Potential to Achieve Goals: Good    Frequency Min 5X/week   Barriers to discharge        Co-evaluation PT/OT/SLP Co-Evaluation/Treatment: Yes Reason for Co-Treatment: Complexity of the patient's impairments (multi-system involvement);For patient/therapist safety;To address functional/ADL transfers PT goals addressed during session: Mobility/safety with mobility;Balance;Strengthening/ROM         AM-PAC PT "6 Clicks" Mobility  Outcome Measure Help needed turning from your back to your side while in a flat bed without using bedrails?: A Lot Help needed moving from lying on your back to sitting on the side of a flat bed without using bedrails?: Total Help needed moving to and from a bed to a chair (including a wheelchair)?: Total Help needed standing up from a chair using your arms (e.g., wheelchair or bedside chair)?: Total Help needed to walk in hospital room?: Total Help needed climbing 3-5 steps with a railing? : Total 6 Click Score: 7    End of Session Equipment Utilized During Treatment: Gait belt;Back brace  Activity Tolerance: Patient limited by fatigue;Patient limited by pain Patient left: in chair;with call bell/phone within reach (pt verbalizes understanding to press call button and wait for assist prior to mobilizing back to bed) Nurse Communication: Mobility  status;Need for lift equipment (use bari stedy +2 for return to bed) PT Visit Diagnosis: Muscle weakness (generalized) (M62.81);Other abnormalities of gait and mobility (R26.89);Unsteadiness on feet (R26.81)    Time: 8270-7867 PT Time Calculation (min) (ACUTE ONLY): 39 min   Charges:   PT Evaluation $PT Eval Low Complexity: 1 Low          Demondre Aguas E, PT Acute Rehabilitation Services Pager [phone]  Office [phone]  Era Parr D Elonda Husky 06/26/2019, 12:58 PM

## 2019-06-26 NOTE — Progress Notes (Signed)
OT Evaluation  - entered in error; See OT evluation

## 2019-06-26 NOTE — Progress Notes (Signed)
Occupational Therapy Treatment Note  Pt seen in conjunction with [facility] and PT to properly fit brace then mobilize to [facility] then to recliner using Stedy. Pt continues to demonstrate RLE weakness and requires Max A +2 with mobility and Total A with LB ADL at this time. Will need rehab at SNF to maximize functional level of independence in order to return home. Will continue to follow acutely.     06/26/19 1334  OT Visit Information  Last OT Received On 06/26/19  Assistance Needed +2  PT/OT/SLP Co-Evaluation/Treatment Yes  Reason for Co-Treatment Complexity of the patient's impairments (multi-system involvement);For patient/therapist safety;To address functional/ADL transfers  OT goals addressed during session ADL's and self-care;Strengthening/ROM  History of Present Illness 69 yo female s/p L2-5 transforaminal lumbar interbody fusion on 6/23. PMH includes ACDF, obesity, cholecystectomy, ventral hernia.  Precautions  Precautions Fall;Back  Precaution Booklet Issued Yes (comment)  Required Braces or Orthoses Spinal Brace  Spinal Brace Lumbar corset;Applied in sitting position ([facility] present to fit pt)  Pain Assessment  Pain Assessment 0-10  Pain Score 8  Pain Location back  Pain Descriptors / Indicators Aching;Burning  Pain Intervention(s) Limited activity within patient's tolerance  Cognition  Arousal/Alertness Awake/alert  Behavior During Therapy Anxious  Overall Cognitive Status Within Functional Limits for tasks assessed  Upper Extremity Assessment  Upper Extremity Assessment Overall WFL for tasks assessed (generalized arthritis)  Lower Extremity Assessment  Lower Extremity Assessment Defer to PT evaluation (R LE buckling)  ADL  Overall ADL's  Needs assistance/impaired  Lower Body Dressing Total assistance;Sit to/from Control and instrumentation engineer for safety/equipment;BSC Charlaine Dalton)  Toileting- Water quality scientist and Hygiene Total assistance  Toileting -  Clothing Manipulation Details (indicate cue type and reason) Sit - stand from [facility] using Stedy; Pt unable to release to complete pericare  Functional mobility during ADLs +2 for physical assistance;Maximal assistance  Bed Mobility  Overal bed mobility Needs Assistance  Sidelying to sit Max assist;+2 for physical assistance  Balance  Overall balance assessment Needs assistance  Sitting balance-Leahy Scale Fair  Standing balance-Leahy Scale Poor  Transfers  Overall transfer level Needs assistance  Transfer via Lift Equipment Stedy  Transfers Sit to/from Stand  Sit to Stand Max assist;+2 physical assistance;+2 safety/equipment;From elevated surface  General transfer comment Max +2 for trunk elevation, hip extension via posterior facilitation, trunk extension via sternal facilitation, and steadying. Pt unsafe and unsteady with use of RW and unable to come to full standing, more successful standing with use of bari stedy and very elevated bed height. Sit to stand x3, from EOBx2, toilet  General Comments  General comments (skin integrity, edema, etc.) BP 141/59; HR 93; Pt complaining of feeling dizzy - most likely related to pain meds  OT - End of Session  Equipment Utilized During Treatment Gait belt;Back brace  Activity Tolerance Patient tolerated treatment well  Patient left in chair;with call bell/phone within reach  Nurse Communication Mobility status;Need for lift equipment  OT Assessment/Plan  OT Plan Discharge plan remains appropriate  OT Visit Diagnosis Unsteadiness on feet (R26.81);Other abnormalities of gait and mobility (R26.89);Muscle weakness (generalized) (M62.81);Repeated falls (R29.6);Pain  Pain - part of body  (back)  OT Frequency (ACUTE ONLY) Min 2X/week  Follow Up Recommendations SNF;Supervision/Assistance - 24 hour  OT Equipment None recommended by OT  AM-PAC OT "6 Clicks" Daily Activity Outcome Measure (Version 2)  Help from another person eating meals? 4  Help from  another person taking care of personal grooming? 3  Help from another person  toileting, which includes using toliet, bedpan, or urinal? 1  Help from another person bathing (including washing, rinsing, drying)? 2  Help from another person to put on and taking off regular upper body clothing? 2  Help from another person to put on and taking off regular lower body clothing? 1  6 Click Score 13  OT Goal Progression  Progress towards OT goals Progressing toward goals  Acute Rehab OT Goals  Patient Stated Goal to do more for herself  OT Goal Formulation With patient  Time For Goal Achievement 07/10/19  Potential to Achieve Goals Good  OT Time Calculation  OT Start Time (ACUTE ONLY) 1056  OT Stop Time (ACUTE ONLY) 1139  OT Time Calculation (min) 43 min  OT General Charges  $OT Visit 1 Visit  OT Treatments  $Self Care/Home Management  23-37 mins   Maurie Boettcher, OT/L   Acute OT Clinical Specialist [place] Pager [phone] Office [phone]

## 2019-06-26 NOTE — Plan of Care (Signed)
  Problem: Education: Goal: Knowledge of General Education information will improve Description: Including pain rating scale, medication(s)/side effects and non-pharmacologic comfort measures Outcome: Progressing   Problem: Safety: Goal: Ability to remain free from injury will improve Outcome: Progressing   

## 2019-06-26 NOTE — Progress Notes (Signed)
OT Evaluation  OT Evaluation  PTA, pt lived at home alone and was modified independent with ADL & IADL tasks until the last few weeks, where she required assistance for IADL tasks. 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 Pt has back brace in room, however unable to fit appropriately on pt - [facility] called and will come to properly fit pt. Eval completed at bed level due to limitations on pain and needing brace to mobilize OOB. Pt will benefit form rehab at SNF to maximize functional level of independence. Will return to mobilize further when [facility] arrives to assist with brace management due to large ventral hernia. Will follow acutely.     06/26/19 0900  OT Visit Information  Last OT Received On 06/26/19  Assistance Needed +2  History of Present Illness 69 yo female s/p L2-5 transforaminal lumbar interbody fusion on 6/23. PMH includes ACDF, obesity, cholecystectomy, ventral hernia.  Precautions  Precautions Back  Precaution Booklet Issued Yes (comment)  Required Braces or Orthoses Spinal Brace  Spinal Brace  (Brace did not fit - needs adjustment - [facility] notified)  Home Living  Family/patient expects to be discharged to: Private residence  Living Arrangements Alone;Other relatives  Available Help at Discharge Available PRN/intermittently (unsure if she has more help)  Type of [place] entrance  Home Layout One level  Bathroom Shower/Tub Tub/shower unit;Curtain  Corporate treasurer No  Home Equipment Grab bars - toilet;Grab bars - tub/shower;Hand held shower head;Shower seat;BSC;Adaptive equipment;Wheelchair - [employer] - 4 wheels;Walker - 2 wheels  Adaptive Equipment Reacher;Sock aid;Long-handled sponge  Prior Function  Level of Independence Independent with assistive device(s)  Comments Has been using a RW since 2017; family assists with IADL tasks however pt  did her own launndry and cooking; driving until the last month; recently bought herself a wc; 3 falls in last 3 weeks - required lifting assist from [employer]  Communication No difficulties  Pain Assessment  Pain Assessment 0-10  Pain Score 8  Pain Location back  Pain Descriptors / Indicators Aching;Burning  Pain Intervention(s) Patient requesting pain meds-RN notified  Cognition  Arousal/Alertness Awake/alert  Behavior During Therapy  (tearful)  Overall Cognitive Status Within Functional Limits for tasks assessed  Upper Extremity Assessment  Upper Extremity Assessment Overall WFL for tasks assessed (arthritis throughout body)  Lower Extremity Assessment  Lower Extremity Assessment Defer to PT evaluation (R leg buckling)  Cervical / Trunk Assessment  Cervical / Trunk Assessment Other exceptions (back sx; hernia)  ADL  Overall ADL's  Needs assistance/impaired  Grooming Set up;Sitting  Upper Body Bathing Minimal assistance;Sitting  Lower Body Bathing Maximal assistance;Bed level  Upper Body Dressing  Moderate assistance;Sitting  Upper Body Dressing Details (indicate cue type and reason) assist to donn brace  Lower Body Dressing Total assistance;Sit to/from Retail buyer  (with use of Stedy)  Toileting- Water quality scientist and Hygiene Total assistance  Toileting - Clothing Manipulation Details (indicate cue type and reason) Declined use of BSC - bedpan placed; attempted to have pt reach around backside to clean however, pt unable  Functional mobility during ADLs +2 for physical assistance;Maximal assistance  Vision- History  Baseline Vision/History Wears glasses  Wears Glasses At all times  Bed Mobility  Overal bed mobility Needs Assistance  Bed Mobility Rolling;Sidelying to Sit  Rolling Mod assist  Transfers  General transfer comment Pt declined due  to pain  General Comments  General comments (skin integrity, edema, etc.) Pt became very tearful  adn states "I'm not giving up I just cant' right now"  OT - End of Session  Activity Tolerance Patient limited by pain  Patient left in bed;with call bell/phone within reach;with bed alarm set  Nurse Communication Mobility status;Need for lift equipment;Other (comment) (brace not fitting properly)  OT Assessment  OT Recommendation/Assessment Patient needs continued OT Services  OT Visit Diagnosis Unsteadiness on feet (R26.81);Other abnormalities of gait and mobility (R26.89);Repeated falls (R29.6);Muscle weakness (generalized) (M62.81);Pain  Pain - part of body  (back)  OT Problem List Decreased strength;Decreased range of motion;Impaired balance (sitting and/or standing);Decreased safety awareness;Decreased knowledge of use of DME or AE;Decreased knowledge of precautions;Obesity;Pain  OT Plan  OT Frequency (ACUTE ONLY) Min 2X/week  OT Treatment/Interventions (ACUTE ONLY) Self-care/ADL training;Therapeutic exercise;DME and/or AE instruction;Therapeutic activities;Patient/family education;Balance training  AM-PAC OT "6 Clicks" Daily Activity Outcome Measure (Version 2)  Help from another person eating meals? 4  Help from another person taking care of personal grooming? 3  Help from another person toileting, which includes using toliet, bedpan, or urinal? 1  Help from another person bathing (including washing, rinsing, drying)? 2  Help from another person to put on and taking off regular upper body clothing? 2  Help from another person to put on and taking off regular lower body clothing? 1  6 Click Score 13  OT Recommendation  Follow Up Recommendations SNF;Supervision/Assistance - 24 hour  OT Equipment None recommended by OT  Individuals Consulted  Consulted and Agree with Results and Recommendations Patient  Acute Rehab OT Goals  Patient Stated Goal to be able to take care of herself  OT Goal Formulation With patient  Time For Goal Achievement 07/10/19  Potential to Achieve Goals Good   OT Time Calculation  OT Start Time (ACUTE ONLY) 0950  OT Stop Time (ACUTE ONLY) 1020  OT Time Calculation (min) 30 min  OT General Charges  $OT Visit 1 Visit  OT Evaluation  $OT Eval Moderate Complexity 1 Mod  OT Treatments  $Self Care/Home Management  8-22 mins  Written Expression  Dominant Hand Right  Maurie Boettcher, OT/L   Acute OT Clinical Specialist [place] Pager [phone] Office [phone]

## 2019-06-26 NOTE — Progress Notes (Signed)
Subjective: The patient is alert and pleasant.  Her back is appropriately sore.  She tells me she is amenable to skilled nursing facility placement in [place].  Objective: Vital signs in last 24 hours: Temp:  [97.5 F (36.4 C)-99.1 F (37.3 C)] 99.1 F (37.3 C) (06/24 1211) Pulse Rate:  [72-96] 96 (06/24 1211) Resp:  [12-20] 18 (06/24 1211) BP: (112-148)/(43-90) 114/59 (06/24 1211) SpO2:  [90 %-99 %] 97 % (06/24 1211) Estimated body mass index is 41.97 kg/m as calculated from the following:   Height as of 06/23/19: 5\' 7"  (1.702 m).   Weight as of 06/23/19: 121.6 kg.   Intake/Output from previous day: 06/23 0701 - 06/24 0700 In: 4562.2 [P.O.:50; I.V.:3571.3; Blood:320; IV Piggyback:621] Out: 2365 [Urine:1665; Blood:700] Intake/Output this shift: No intake/output data recorded.  Physical exam Glasgow Coma Scale 15.  The patient has good strength in her bilateral quadricep, gastrocnemius and dorsiflexors.  Lab Results: Recent Labs    06/26/19 0253  WBC 13.1*  HGB 10.4*  HCT 32.0*  PLT 170   BMET Recent Labs    06/26/19 0253  NA 135  K 4.4  CL 103  CO2 24  GLUCOSE 157*  BUN 13  CREATININE 0.75  CALCIUM 8.5*    Studies/Results: DG Lumbar Spine 2-3 Views  Result Date: 06/25/2019 CLINICAL DATA:  Posterior surgery/fusion L2-L5 EXAM: LUMBAR SPINE - 2-3 VIEW; DG C-ARM 1-60 MIN COMPARISON:  Intraoperative images of 06/25/2019 FINDINGS: Pedicle screws are present at 4 levels of the lumbar spine with intervening disc prostheses. Incomplete visualization of landmarks, unable to accurately assign levels. IMPRESSION: BILATERAL pedicle screws at 4 levels of the lumbar spine with intervening disc prostheses. Electronically Signed   By: Lavonia Dana M.D.   On: 06/25/2019 15:52   DG Lumbar Spine 2-3 Views  Result Date: 06/25/2019 CLINICAL DATA:  Localization in OR EXAM: LUMBAR SPINE - 2-3 VIEW COMPARISON:  Portable exam 0840 hours compared to 05/16/2019 FINDINGS: Five lumbar  vertebra present on prior prior radiographs with a partially lumbarized S1. Recommend correlation of this numbering system with that used on any prior outside imaging to ensure consistency. Metallic probe via dorsal approach projects dorsal to the mid L3 level. Surgical sponge in tissue spreaders are present. IMPRESSION: Dorsal localization of the mid L3 level. Electronically Signed   By: Lavonia Dana M.D.   On: 06/25/2019 15:51   DG C-Arm 1-60 Min  Result Date: 06/25/2019 CLINICAL DATA:  Posterior surgery/fusion L2-L5 EXAM: LUMBAR SPINE - 2-3 VIEW; DG C-ARM 1-60 MIN COMPARISON:  Intraoperative images of 06/25/2019 FINDINGS: Pedicle screws are present at 4 levels of the lumbar spine with intervening disc prostheses. Incomplete visualization of landmarks, unable to accurately assign levels. IMPRESSION: BILATERAL pedicle screws at 4 levels of the lumbar spine with intervening disc prostheses. Electronically Signed   By: Lavonia Dana M.D.   On: 06/25/2019 15:52    Assessment/Plan: Postop day #1: The patient is doing well.  We will mobilize her with PT and OT.  She will likely need skilled nursing facility placement in [place].  LOS: 1 day     Ophelia Charter 06/26/2019, 2:42 PM

## 2019-06-27 NOTE — Progress Notes (Signed)
Subjective: The patient is alert and pleasant.  She complains that she has not gotten a bath.  Her back is appropriately sore after her surgery.  Objective: Vital signs in last 24 hours: Temp:  [98.6 F (37 C)-100.3 F (37.9 C)] 98.6 F (37 C) (06/25 0449) Pulse Rate:  [81-105] 81 (06/25 0449) Resp:  [16-20] 19 (06/25 0449) BP: (114-152)/(51-80) 125/51 (06/25 0449) SpO2:  [96 %-100 %] 96 % (06/25 0449) Estimated body mass index is 41.97 kg/m as calculated from the following:   Height as of 06/23/19: 5\' 7"  (1.702 m).   Weight as of 06/23/19: 121.6 kg.   Intake/Output from previous day: 06/24 0701 - 06/25 0700 In: 66 [P.O.:50] Out: 650 [Urine:650] Intake/Output this shift: Total I/O In: -  Out: 650 [Urine:650]  Physical exam the patient is alert and pleasant.  Her dressing has a small bloodstain and is coming off.  She continues to move her lower extremities well, i.e. better than before surgery.   Lab Results: Recent Labs    06/26/19 0253  WBC 13.1*  HGB 10.4*  HCT 32.0*  PLT 170   BMET Recent Labs    06/26/19 0253  NA 135  K 4.4  CL 103  CO2 24  GLUCOSE 157*  BUN 13  CREATININE 0.75  CALCIUM 8.5*    Studies/Results: DG Lumbar Spine 2-3 Views  Result Date: 06/25/2019 CLINICAL DATA:  Posterior surgery/fusion L2-L5 EXAM: LUMBAR SPINE - 2-3 VIEW; DG C-ARM 1-60 MIN COMPARISON:  Intraoperative images of 06/25/2019 FINDINGS: Pedicle screws are present at 4 levels of the lumbar spine with intervening disc prostheses. Incomplete visualization of landmarks, unable to accurately assign levels. IMPRESSION: BILATERAL pedicle screws at 4 levels of the lumbar spine with intervening disc prostheses. Electronically Signed   By: Lavonia Dana M.D.   On: 06/25/2019 15:52   DG Lumbar Spine 2-3 Views  Result Date: 06/25/2019 CLINICAL DATA:  Localization in OR EXAM: LUMBAR SPINE - 2-3 VIEW COMPARISON:  Portable exam 0840 hours compared to 05/16/2019 FINDINGS: Five lumbar vertebra  present on prior prior radiographs with a partially lumbarized S1. Recommend correlation of this numbering system with that used on any prior outside imaging to ensure consistency. Metallic probe via dorsal approach projects dorsal to the mid L3 level. Surgical sponge in tissue spreaders are present. IMPRESSION: Dorsal localization of the mid L3 level. Electronically Signed   By: Lavonia Dana M.D.   On: 06/25/2019 15:51   DG C-Arm 1-60 Min  Result Date: 06/25/2019 CLINICAL DATA:  Posterior surgery/fusion L2-L5 EXAM: LUMBAR SPINE - 2-3 VIEW; DG C-ARM 1-60 MIN COMPARISON:  Intraoperative images of 06/25/2019 FINDINGS: Pedicle screws are present at 4 levels of the lumbar spine with intervening disc prostheses. Incomplete visualization of landmarks, unable to accurately assign levels. IMPRESSION: BILATERAL pedicle screws at 4 levels of the lumbar spine with intervening disc prostheses. Electronically Signed   By: Lavonia Dana M.D.   On: 06/25/2019 15:52    Assessment/Plan: Postop day #2: The patient is progressing slowly.  She will need skilled nursing facility placement.  We are working on a facility in [place].  I have answered all her questions and encouraged her to mobilize.  LOS: 2 days     Selena Rodgers 06/27/2019, 6:44 AM

## 2019-06-27 NOTE — Progress Notes (Signed)
Physical Therapy Treatment Patient Details Name: Selena Rodgers MRN: [id number] DOB: 05/05/1950 Today's Date: 06/27/2019    History of Present Illness Pt is a 69 yo female s/p L2-5 transforaminal lumbar interbody fusion on 6/23. PMH includes ACDF, obesity, cholecystectomy, ventral hernia.    PT Comments    Pt remains significantly limited overall with mobility secondary to weakness and fatigue. Pt reported that she had been sitting up in the chair for ~3 hrs prior to PT arriving which fatigued her. She required max A x2 with use of STEDY to stand from the chair and was then moved to the bed with the STEDY. She required mod A to return to sidelying on L side as she stated this was a more comfortable position for her. Pt would continue to benefit from skilled physical therapy services at this time while admitted and after d/c to address the below listed limitations in order to improve overall safety and independence with functional mobility.    Follow Up Recommendations  SNF;Supervision/Assistance - 24 hour     Equipment Recommendations  None recommended by PT    Recommendations for Other Services       Precautions / Restrictions Precautions Precautions: Fall;Back Precaution Comments: reviewed with pt throughout Required Braces or Orthoses: Spinal Brace Spinal Brace: Thoracolumbosacral orthotic;Applied in sitting position Restrictions Weight Bearing Restrictions: No    Mobility  Bed Mobility Overal bed mobility: Needs Assistance Bed Mobility: Sit to Sidelying;Rolling Rolling: Supervision       Sit to sidelying: Mod assist General bed mobility comments: assistance needed to return bilateral LEs onto bed  Transfers Overall transfer level: Needs assistance   Transfers: Sit to/from Stand Sit to Stand: Max assist;+2 physical assistance;+2 safety/equipment         General transfer comment: initially pt unable to achieve full upright standing with max A x2 and use of  STEDY; however, on second attempt pt was able to achieve more of an upright posture to bed able to successfully stand with STEDY and max A x2  Ambulation/Gait             General Gait Details: unable   Stairs             Wheelchair Mobility    Modified Rankin (Stroke Patients Only)       Balance Overall balance assessment: Needs assistance Sitting-balance support: Feet supported Sitting balance-Leahy Scale: Fair     Standing balance support: Bilateral upper extremity supported;During functional activity Standing balance-Leahy Scale: Poor Standing balance comment: requires max +2 to stand                            Cognition Arousal/Alertness: Awake/alert Behavior During Therapy: WFL for tasks assessed/performed Overall Cognitive Status: Within Functional Limits for tasks assessed                                        Exercises      General Comments        Pertinent Vitals/Pain Pain Assessment: Faces Faces Pain Scale: Hurts little more Pain Location: back Pain Descriptors / Indicators: Aching;Burning Pain Intervention(s): Monitored during session;Repositioned    Home Living                      Prior Function            PT  Goals (current goals can now be found in the care plan section) Acute Rehab PT Goals PT Goal Formulation: With patient Time For Goal Achievement: 07/10/19 Potential to Achieve Goals: Good Progress towards PT goals: Progressing toward goals    Frequency    Min 5X/week      PT Plan Current plan remains appropriate    Co-evaluation              AM-PAC PT "6 Clicks" Mobility   Outcome Measure  Help needed turning from your back to your side while in a flat bed without using bedrails?: A Little Help needed moving from lying on your back to sitting on the side of a flat bed without using bedrails?: A Lot Help needed moving to and from a bed to a chair (including a  wheelchair)?: Total Help needed standing up from a chair using your arms (e.g., wheelchair or bedside chair)?: Total Help needed to walk in hospital room?: Total Help needed climbing 3-5 steps with a railing? : Total 6 Click Score: 9    End of Session Equipment Utilized During Treatment: Gait belt;Back brace Activity Tolerance: Patient limited by fatigue;Patient limited by pain Patient left: in bed;with call bell/phone within reach;with bed alarm set Nurse Communication: Mobility status;Need for lift equipment PT Visit Diagnosis: Muscle weakness (generalized) (M62.81);Other abnormalities of gait and mobility (R26.89);Unsteadiness on feet (R26.81)     Time: 7564-3329 PT Time Calculation (min) (ACUTE ONLY): 33 min  Charges:  $Therapeutic Activity: 23-37 mins                     Anastasio Champion, DPT  Acute Rehabilitation Services Pager [phone] Office [place] 06/27/2019, 2:58 PM

## 2019-06-27 NOTE — Plan of Care (Signed)
  Problem: Education: Goal: Knowledge of General Education information will improve Description: Including pain rating scale, medication(s)/side effects and non-pharmacologic comfort measures Outcome: Progressing   Problem: Coping: Goal: Level of anxiety will decrease Outcome: Progressing   Problem: Safety: Goal: Ability to remain free from injury will improve Outcome: Progressing   

## 2019-06-27 NOTE — TOC Initial Note (Signed)
Transition of Care [facility]) - Initial/Assessment Note    Patient Details  Name: Selena Rodgers MRN: [id number] Date of Birth: 1950/09/23  [facility] of [facility] CM/SW Contact:    Pollie Friar, RN Phone Number: 06/27/2019, 3:26 PM  Clinical Narrative:                 Recommendations are for SNF and pt is agreeable. She was faxed out in [place] area per her request. Choice provided for facilities and she selected [facility]. CM has left voice mail for Neoma Laming at [facility] to have her start insurance.  TOC following.  Expected Discharge Plan: Skilled Nursing Facility Barriers to Discharge: Continued Medical Work up   Patient Goals and CMS Choice   CMS Medicare.gov Compare Post Acute Care list provided to:: Patient Choice offered to / list presented to : Patient  Expected Discharge Plan and Services Expected Discharge Plan: [place] In-house Referral: Clinical Social Work Discharge Planning Services: CM Consult Post Acute Care Choice: [place] arrangements for the past 2 months: [place]                                      Prior Living Arrangements/Services Living arrangements for the past 2 months: Single Family Home Lives with:: Relatives Patient language and need for interpreter reviewed:: Yes Do you feel safe going back to the place where you live?: Yes      Need for Family Participation in Patient Care: Yes (Comment) Care giver support system in place?: No (comment) Current home services: DME Criminal Activity/Legal Involvement Pertinent to Current Situation/Hospitalization: No - Comment as needed  Activities of Daily Living      Permission Sought/Granted                  Emotional Assessment Appearance:: Appears stated age Attitude/Demeanor/Rapport: Engaged Affect (typically observed): Accepting Orientation: : Oriented to Self, Oriented to Place, Oriented to  Time, Oriented to  Situation   Psych Involvement: No (comment)  Admission diagnosis:  Spondylolisthesis, lumbar region [M43.16] Patient Active Problem List   Diagnosis Date Noted  . Spondylolisthesis, lumbar region 06/25/2019  . Adjustment disorder with mixed anxiety and depressed mood   . Slow transit constipation   . Myelopathy ([place]) 04/21/2015  . Cervical myelopathy ([place])   . Constipation due to pain medication   . Depression   . Primary osteoarthritis of right shoulder   . S/P spinal surgery   . Abnormality of gait   . Weakness   . Other secondary hypertension   . Post-operative pain   . Arthritis   . Cervical spondylosis with myelopathy 04/19/2015  . 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 Ventral hernia 10/07/2014  . Current tobacco use 10/06/2014  . Anxiety 09/17/2014  . Back pain, chronic 09/17/2014  . Clinical depression 09/17/2014  . BP (high blood pressure) 09/17/2014  . Borderline diabetes 09/17/2014  . Arthritis, degenerative 09/17/2014  . Spinal stenosis 09/17/2014  . Compulsive tobacco user syndrome 09/17/2014  . Abdominal mass 06/30/2014  . Incisional hernia, without obstruction or gangrene 06/30/2014  . Morbid obesity ([place]) 06/30/2014  . Hypercholesteremia 05/23/2013  . 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 Hypercholesterolemia 05/23/2013   PCP:  [name], MD Pharmacy:   [id number] - [place], [place] - [address] [place] [address] Phone: [phone] Fax: [phone]  Social Determinants of Health (SDOH) Interventions    Readmission Risk Interventions No flowsheet data found.

## 2019-06-28 MED ORDER — ENOXAPARIN SODIUM 40 MG/0.4ML ~~LOC~~ SOLN
40.0000 mg | SUBCUTANEOUS | Status: DC
  Administered 2019-06-28 – 2019-07-04 (×7): 40 mg via SUBCUTANEOUS
  Filled 2019-06-28 (×7): qty 0.4

## 2019-06-28 NOTE — Progress Notes (Signed)
While working with therapy patient's dressing at her back was saturated as well as some leaking occurring from the wound. Dressing was changed. MD notified. Awaiting further instructions. Patient denied pain and tolerated the dressing change. RN will continue to monitor.

## 2019-06-28 NOTE — Progress Notes (Signed)
Physical Therapy Treatment Patient Details Name: Selena Rodgers MRN: [id number] DOB: [birth year] Today's Date: 06/28/2019    History of Present Illness Pt is a 69 yo female s/p L2-5 transforaminal lumbar interbody fusion on 6/23. PMH includes ACDF, obesity, cholecystectomy, ventral hernia.    PT Comments    Patient received in bed and motivated to participate with PT. Patient continues to require significant assist for all mobility and functional tasks. Cueing throughout for safety and precautions. Required use of STEDY for sit to stand at bedside with Max A +2 - 2 trials to clear buttocks from bedside to come into standing, however heavy forward trunk flexion. Patient to continue to require SNF at discharge. Will continue to follow.    Follow Up Recommendations  SNF;Supervision/Assistance - 24 hour     Equipment Recommendations  None recommended by PT    Recommendations for Other Services       Precautions / Restrictions Precautions Precautions: Fall;Back Required Braces or Orthoses: Spinal Brace Spinal Brace: Thoracolumbosacral orthotic;Applied in sitting position    Mobility  Bed Mobility Overal bed mobility: Needs Assistance Bed Mobility: Sidelying to Sit   Sidelying to sit: Mod assist;Max assist;+2 for physical assistance       General bed mobility comments: increased time to bring feet to EOB; Mod/Max A+2 to come into sitting at EOB; cueing for weight shifting and upright posture  Transfers Overall transfer level: Needs assistance   Transfers: Sit to/from Stand Sit to Stand: Max assist;+2 physical assistance;+2 safety/equipment         General transfer comment: 2 trials to come to standing at bedside with use of STEDY; cueing for posturing and for UE assist  Ambulation/Gait             General Gait Details: unable   Stairs             Wheelchair Mobility    Modified Rankin (Stroke Patients Only)       Balance Overall balance  assessment: Needs assistance Sitting-balance support: Feet supported Sitting balance-Leahy Scale: Fair     Standing balance support: Bilateral upper extremity supported;During functional activity Standing balance-Leahy Scale: Poor Standing balance comment: requires max +2 to stand                            Cognition Arousal/Alertness: Awake/alert Behavior During Therapy: WFL for tasks assessed/performed Overall Cognitive Status: Within Functional Limits for tasks assessed                                        Exercises      General Comments General comments (skin integrity, edema, etc.): increased drainage at incision site prior to transfer - nunrsing made aware and changing dressing while patient sitting EOB      Pertinent Vitals/Pain Pain Assessment: 0-10 Pain Score: 6  Pain Location: back Pain Descriptors / Indicators: Aching;Discomfort;Sore Pain Intervention(s): Limited activity within patient's tolerance;Monitored during session;Repositioned;Patient requesting pain meds-RN notified    Home Living                      Prior Function            PT Goals (current goals can now be found in the care plan section) Acute Rehab PT Goals Patient Stated Goal: to do more for herself PT Goal Formulation: With patient Time  For Goal Achievement: 07/10/19 Potential to Achieve Goals: Good Progress towards PT goals: Progressing toward goals    Frequency    Min 5X/week      PT Plan Current plan remains appropriate    Co-evaluation              AM-PAC PT "6 Clicks" Mobility   Outcome Measure  Help needed turning from your back to your side while in a flat bed without using bedrails?: A Little Help needed moving from lying on your back to sitting on the side of a flat bed without using bedrails?: A Lot Help needed moving to and from a bed to a chair (including a wheelchair)?: Total Help needed standing up from a chair using  your arms (e.g., wheelchair or bedside chair)?: Total Help needed to walk in hospital room?: Total Help needed climbing 3-5 steps with a railing? : Total 6 Click Score: 9    End of Session Equipment Utilized During Treatment: Gait belt;Back brace Activity Tolerance: Patient tolerated treatment well;Patient limited by fatigue Patient left: in chair;with call bell/phone within reach Nurse Communication: Mobility status;Need for lift equipment PT Visit Diagnosis: Muscle weakness (generalized) (M62.81);Other abnormalities of gait and mobility (R26.89);Unsteadiness on feet (R26.81)     Time: 1000-1030 PT Time Calculation (min) (ACUTE ONLY): 30 min  Charges:  $Therapeutic Activity: 8-22 mins                    Milana Na, PT, DPT Supplemental Physical Therapist 06/28/19 10:35 AM Office: [phone]

## 2019-06-28 NOTE — Progress Notes (Signed)
Subjective: Patient reports pain well-controlled  Objective: Vital signs in last 24 hours: Temp:  [98.5 F (36.9 C)-99.6 F (37.6 C)] 98.5 F (36.9 C) (06/26 0751) Pulse Rate:  [67-91] 72 (06/26 0751) Resp:  [16-19] 18 (06/26 0751) BP: (116-135)/(44-65) 135/56 (06/26 0751) SpO2:  [94 %-99 %] 99 % (06/26 0751)  Intake/Output from previous day: No intake/output data recorded. Intake/Output this shift: No intake/output data recorded.  NAD Obese, LSO brace in place Dressing redressed, soft, nontender.  No purulent drainage.  Small amount of serosanguinous fluid noted, no active drainage 4+/5 in LEs  Lab Results: Recent Labs    06/26/19 0253  WBC 13.1*  HGB 10.4*  HCT 32.0*  PLT 170   BMET Recent Labs    06/26/19 0253  NA 135  K 4.4  CL 103  CO2 24  GLUCOSE 157*  BUN 13  CREATININE 0.75  CALCIUM 8.5*    Studies/Results: No results found.  Assessment/Plan: S/p lumbar PSIF - awaiting SNF - continue mobilization - DVT prophylaxis    Vallarie Mare 06/28/2019, 10:13 AM

## 2019-06-29 NOTE — Progress Notes (Signed)
Patient ID: Selena Rodgers, female   DOB: August 03, 1950, 69 y.o.   MRN: [id number] Vital signs are stable Patient notes that she has reasonable amount of discomfort in her low back which makes moving difficult she is eager to get on to rehab and is looking to be transferred to [facility] in the near future.  Continue supportive care here.

## 2019-06-29 NOTE — Progress Notes (Signed)
Physical Therapy Treatment Patient Details Name: Selena Rodgers MRN: [id number] DOB: February 27, 1950 Today's Date: 06/29/2019    History of Present Illness Pt is a 69 yo female s/p L2-5 transforaminal lumbar interbody fusion on 6/23. PMH includes ACDF, obesity, cholecystectomy, ventral hernia.    PT Comments    Patient tearful upon PT arrival - PT providing encouragement to continue to progress with mobility. Session limited to bed level mobility and transfers with STEDY. Overall, Max A +2 for rolling, sideyling <> sit, and sit to stand at bedside. Max cueing for sequencing and use of B UE for increased trunk support with limited carryover. Patient to continue to benefit from skilled services to address limited mobility.     Follow Up Recommendations  SNF;Supervision/Assistance - 24 hour     Equipment Recommendations  None recommended by PT    Recommendations for Other Services       Precautions / Restrictions Precautions Precautions: Fall;Back Required Braces or Orthoses: Spinal Brace Spinal Brace: Thoracolumbosacral orthotic;Applied in sitting position Restrictions Weight Bearing Restrictions: No    Mobility  Bed Mobility Overal bed mobility: Needs Assistance Bed Mobility: Rolling;Sidelying to Sit;Sit to Sidelying Rolling: Min assist Sidelying to sit: Mod assist;Max assist;+2 for physical assistance     Sit to sidelying: Mod assist;+2 for physical assistance General bed mobility comments: increased time and effort to come to EOB - cueing for sequencing throughout; Overall Max A +2 for bed mobility   Transfers Overall transfer level: Needs assistance   Transfers: Sit to/from Stand Sit to Stand: Max assist;+2 physical assistance;+2 safety/equipment         General transfer comment: sit to stand at bedside to STEDY - max cueing for hand placement and sequencing with tendency for heavy forward flexion  Ambulation/Gait             General Gait Details:  unable   Stairs             Wheelchair Mobility    Modified Rankin (Stroke Patients Only)       Balance Overall balance assessment: Needs assistance Sitting-balance support: Feet supported Sitting balance-Leahy Scale: Fair     Standing balance support: Bilateral upper extremity supported;During functional activity Standing balance-Leahy Scale: Poor Standing balance comment: requires max +2 to stand                            Cognition Arousal/Alertness: Awake/alert Behavior During Therapy: WFL for tasks assessed/performed Overall Cognitive Status: Within Functional Limits for tasks assessed                                        Exercises      General Comments General comments (skin integrity, edema, etc.): continued drainage with nursing called to change dressing at bedside      Pertinent Vitals/Pain Pain Assessment: Faces Faces Pain Scale: Hurts even more Pain Location: back Pain Descriptors / Indicators: Aching;Discomfort;Sore Pain Intervention(s): Limited activity within patient's tolerance;Monitored during session;Repositioned    Home Living                      Prior Function            PT Goals (current goals can now be found in the care plan section) Acute Rehab PT Goals Patient Stated Goal: to do more for herself PT Goal Formulation: With  patient Time For Goal Achievement: 07/10/19 Potential to Achieve Goals: Good Progress towards PT goals: Progressing toward goals    Frequency    Min 5X/week      PT Plan Current plan remains appropriate    Co-evaluation              AM-PAC PT "6 Clicks" Mobility   Outcome Measure  Help needed turning from your back to your side while in a flat bed without using bedrails?: A Lot Help needed moving from lying on your back to sitting on the side of a flat bed without using bedrails?: A Lot Help needed moving to and from a bed to a chair (including a  wheelchair)?: Total Help needed standing up from a chair using your arms (e.g., wheelchair or bedside chair)?: Total Help needed to walk in hospital room?: Total Help needed climbing 3-5 steps with a railing? : Total 6 Click Score: 8    End of Session Equipment Utilized During Treatment: Gait belt;Back brace Activity Tolerance: Patient tolerated treatment well;Patient limited by fatigue Patient left: in bed;with call bell/phone within reach Nurse Communication: Mobility status;Need for lift equipment PT Visit Diagnosis: Muscle weakness (generalized) (M62.81);Other abnormalities of gait and mobility (R26.89);Unsteadiness on feet (R26.81)     Time: 6754-4920 PT Time Calculation (min) (ACUTE ONLY): 40 min  Charges:  $Therapeutic Activity: 38-52 mins                     Milana Na, PT, DPT Supplemental Physical Therapist 06/29/19 11:23 AM Office: [phone]

## 2019-06-29 NOTE — Progress Notes (Signed)
While working with PT patients' incisional dressing was changed. Drainage is still sanguinous. 4x4 gauze was 75% saturated with small amount of active leaking during change. RN will continue to monitor.

## 2019-06-30 MED ORDER — CEPHALEXIN 500 MG PO CAPS
1000.0000 mg | ORAL_CAPSULE | Freq: Four times a day (QID) | ORAL | Status: DC
  Administered 2019-06-30 – 2019-07-04 (×17): 1000 mg via ORAL
  Filled 2019-06-30 (×17): qty 2

## 2019-06-30 NOTE — Progress Notes (Signed)
Occupational Therapy Treatment Patient Details Name: Selena Rodgers MRN: [id number] DOB: [birth year] Today's Date: 06/30/2019    History of present illness Pt is a 69 yo female s/p L2-5 transforaminal lumbar interbody fusion on 6/23. PMH includes ACDF, obesity, cholecystectomy, ventral hernia.   OT comments  Pt seen in conjunction with PT to maximize participation and activity tolerance. Overall, pt requires total - MAX A +2 for squat pivot transfer from BSC>recliner. Pt declined use of stedy, but pt unable to come into full stand during squat pivot transfer. Pt very fearful during movement noted to frantically reach out for therapist during transfer. Pt currently requires total A for LB ADLs and MAX A for UB ADLs. Continue to recommend SNF, will follow.   Follow Up Recommendations  SNF;Supervision/Assistance - 24 hour    Equipment Recommendations  None recommended by OT    Recommendations for Other Services      Precautions / Restrictions Precautions Precautions: Fall;Back Precaution Booklet Issued: Yes (comment) Required Braces or Orthoses: Spinal Brace Spinal Brace: Thoracolumbosacral orthotic;Applied in sitting position Restrictions Weight Bearing Restrictions: No       Mobility Bed Mobility               General bed mobility comments: entered session with pt on BSC  Transfers Overall transfer level: Needs assistance Equipment used: 2 person hand held assist Transfers: Squat Pivot Transfers     Squat pivot transfers: Total assist;+2 physical assistance     General transfer comment: pt declined used of stedy; total A +2 for squat pivot from BSC>recliner. pt anxious with all movement, noted to pull on therapist and needs direct specific cues to follow commands related to mobility    Balance Overall balance assessment: Needs assistance Sitting-balance support: Feet supported Sitting balance-Leahy Scale: Fair       Standing balance-Leahy Scale: Zero                              ADL either performed or assessed with clinical judgement   ADL Overall ADL's : Needs assistance/impaired                 Upper Body Dressing : Maximal assistance;Sitting Upper Body Dressing Details (indicate cue type and reason): assist to donn brace Lower Body Dressing: Total assistance;Sit to/from stand Lower Body Dressing Details (indicate cue type and reason): to don shoes Toilet Transfer: Total assistance;+2 for physical assistance;+2 for safety/equipment;Maximal assistance;BSC;Stand-pivot Toilet Transfer Details (indicate cue type and reason): total A- MAX A +2 for stand pivot from BSC>recliner Toileting- Clothing Manipulation and Hygiene: Total assistance;Sit to/from stand       Functional mobility during ADLs: +2 for physical assistance;Maximal assistance;Total assistance General ADL Comments: pt continues to present with cognitive impairments, back precautions, generalized weakness, decreased ability to care for self and pain     Vision       Perception     Praxis      Cognition Arousal/Alertness: Awake/alert Behavior During Therapy: WFL for tasks assessed/performed Overall Cognitive Status: Impaired/Different from baseline Area of Impairment: Safety/judgement;Problem solving;Following commands;Attention;Awareness                   Current Attention Level: Focused   Following Commands: Follows one step commands with increased time Safety/Judgement: Decreased awareness of safety Awareness: Intellectual Problem Solving: Requires verbal cues;Requires tactile cues;Difficulty sequencing General Comments: pt easily distracted, needing MAX cues to focus on session. very insistent on doing things  her way, needs increased time to process and follow cues        Exercises     Shoulder Instructions       General Comments placed pad between brace and skin as pt declined wearing second gown; noted continued drainage from  incision    Pertinent Vitals/ Pain       Pain Assessment: Faces Faces Pain Scale: Hurts whole lot Pain Location: back; L hip Pain Descriptors / Indicators: Aching;Discomfort;Sore Pain Intervention(s): Limited activity within patient's tolerance;Monitored during session;Repositioned;Patient requesting pain meds-RN notified  Home Living                                          Prior Functioning/Environment              Frequency  Min 2X/week        Progress Toward Goals  OT Goals(current goals can now be found in the care plan section)  Progress towards OT goals: Progressing toward goals  Acute Rehab OT Goals Patient Stated Goal: to do more for herself OT Goal Formulation: With patient Time For Goal Achievement: 07/10/19 Potential to Achieve Goals: Good  Plan Discharge plan remains appropriate;Frequency remains appropriate    Co-evaluation      Reason for Co-Treatment: For patient/therapist safety;To address functional/ADL transfers;Necessary to address cognition/behavior during functional activity;Complexity of the patient's impairments (multi-system involvement)   OT goals addressed during session: ADL's and self-care      AM-PAC OT "6 Clicks" Daily Activity     Outcome Measure   Help from another person eating meals?: None Help from another person taking care of personal grooming?: A Little Help from another person toileting, which includes using toliet, bedpan, or urinal?: Total Help from another person bathing (including washing, rinsing, drying)?: A Lot Help from another person to put on and taking off regular upper body clothing?: A Lot Help from another person to put on and taking off regular lower body clothing?: Total 6 Click Score: 13    End of Session Equipment Utilized During Treatment: Back brace  OT Visit Diagnosis: Unsteadiness on feet (R26.81);Other abnormalities of gait and mobility (R26.89);Muscle weakness (generalized)  (M62.81);Repeated falls (R29.6);Pain   Activity Tolerance Patient limited by pain   Patient Left in chair;with call bell/phone within reach;Other (comment) (ordered lift pad to place behind pt to lift back to bed)   Nurse Communication Mobility status;Need for lift equipment        Time: 6004-5997 OT Time Calculation (min): 28 min  Charges: OT General Charges $OT Visit: 1 Visit OT Treatments $Self Care/Home Management : 8-22 mins  Lanier Clam., COTA/L Acute Rehabilitation Services [phone]    Ihor Gully 06/30/2019, 5:02 PM

## 2019-06-30 NOTE — Progress Notes (Signed)
Physical Therapy Treatment Patient Details Name: Selena Rodgers MRN: [id number] DOB: [birth year] Today's Date: 06/30/2019    History of Present Illness Pt is a 69 yo female s/p L2-5 transforaminal lumbar interbody fusion on 6/23. PMH includes ACDF, obesity, cholecystectomy, ventral hernia.    PT Comments    Pt was anxious but will to participate.  Several options tried for transfers including use of the RW before choosing a face to face assist for therapists safety since the patient declined use of the STEDY.   Follow Up Recommendations  SNF;Supervision/Assistance - 24 hour     Equipment Recommendations  None recommended by PT    Recommendations for Other Services       Precautions / Restrictions Precautions Precautions: Fall;Back Precaution Booklet Issued: Yes (comment) Required Braces or Orthoses: Spinal Brace Spinal Brace: Thoracolumbosacral orthotic;Applied in sitting position Restrictions Weight Bearing Restrictions: No    Mobility  Bed Mobility Overal bed mobility: Needs Assistance Bed Mobility: Sidelying to Sit   Sidelying to sit: Max assist       General bed mobility comments: entered session with pt on BSC  Transfers Overall transfer level: Needs assistance Equipment used: 2 person hand held assist Transfers: Squat Pivot Transfers     Squat pivot transfers: Total assist;+2 physical assistance     General transfer comment: pt declined used of stedy; total A +2 for squat pivot from BSC>recliner. pt anxious with all movement, noted to pull on therapist and needs direct specific cues to follow commands related to mobility  Ambulation/Gait                 Stairs             Wheelchair Mobility    Modified Rankin (Stroke Patients Only)       Balance Overall balance assessment: Needs assistance Sitting-balance support: Feet supported Sitting balance-Leahy Scale: Fair     Standing balance support: Bilateral upper extremity  supported;During functional activity Standing balance-Leahy Scale: Zero Standing balance comment: requires max +2 to stand                            Cognition Arousal/Alertness: Awake/alert Behavior During Therapy: WFL for tasks assessed/performed Overall Cognitive Status: Impaired/Different from baseline Area of Impairment: Safety/judgement;Problem solving;Following commands;Attention;Awareness                   Current Attention Level: Focused   Following Commands: Follows one step commands with increased time Safety/Judgement: Decreased awareness of safety Awareness: Intellectual Problem Solving: Requires verbal cues;Requires tactile cues;Difficulty sequencing General Comments: pt easily distracted, needing MAX cues to focus on session. very insistent on doing things her way, needs increased time to process and follow cues      Exercises      General Comments General comments (skin integrity, edema, etc.): placed pad between brace and skin as pt declined wearing second gown; noted continued drainage from incision      Pertinent Vitals/Pain Pain Assessment: Faces Faces Pain Scale: Hurts whole lot Pain Location: back; L hip Pain Descriptors / Indicators: Aching;Discomfort;Sore Pain Intervention(s): Monitored during session    Home Living                      Prior Function            PT Goals (current goals can now be found in the care plan section) Acute Rehab PT Goals Patient Stated Goal: to do  more for herself PT Goal Formulation: With patient Time For Goal Achievement: 07/10/19 Potential to Achieve Goals: Fair Progress towards PT goals: Progressing toward goals (slow changes)    Frequency    Min 5X/week      PT Plan Current plan remains appropriate    Co-evaluation PT/OT/SLP Co-Evaluation/Treatment: Yes Reason for Co-Treatment: Complexity of the patient's impairments (multi-system involvement) PT goals addressed during  session: Mobility/safety with mobility OT goals addressed during session: ADL's and self-care;Strengthening/ROM      AM-PAC PT "6 Clicks" Mobility   Outcome Measure  Help needed turning from your back to your side while in a flat bed without using bedrails?: A Lot Help needed moving from lying on your back to sitting on the side of a flat bed without using bedrails?: A Lot Help needed moving to and from a bed to a chair (including a wheelchair)?: Total Help needed standing up from a chair using your arms (e.g., wheelchair or bedside chair)?: Total Help needed to walk in hospital room?: Total Help needed climbing 3-5 steps with a railing? : Total 6 Click Score: 8    End of Session Equipment Utilized During Treatment: Back brace Activity Tolerance: Patient tolerated treatment well;Patient limited by pain Patient left: Other (comment);with call bell/phone within reach;with nursing/sitter in room Nurse Communication: Mobility status PT Visit Diagnosis: Muscle weakness (generalized) (M62.81);Other abnormalities of gait and mobility (R26.89);Unsteadiness on feet (R26.81)     Time: 9381-8299 PT Time Calculation (min) (ACUTE ONLY): 27 min  Charges:  $Therapeutic Activity: 8-22 mins                     06/30/2019  Ginger Carne., PT Acute Rehabilitation Services [phone]  (pager) [phone]  (office)   Tessie Fass Amylee Lodato 06/30/2019, 6:34 PM

## 2019-06-30 NOTE — Progress Notes (Signed)
Progress Note  Pt entered into the transfer anxious and in pain, scare she was going to fall.    06/30/19 1700  PT Visit Information  Last PT Received On 06/30/19  Assistance Needed +2  History of Present Illness Pt is a 69 yo female s/p L2-5 transforaminal lumbar interbody fusion on 6/23. PMH includes ACDF, obesity, cholecystectomy, ventral hernia.  Precautions  Precautions Fall;Back  Required Braces or Orthoses Spinal Brace  Spinal Brace TLSO;Applied in sitting position  Pain Assessment  Pain Assessment Faces  Faces Pain Scale 8  Pain Location L hip  Pain Descriptors / Indicators Grimacing;Guarding;Discomfort  Pain Intervention(s) Monitored during session;Limited activity within patient's tolerance  Cognition  Arousal/Alertness Awake/alert  Behavior During Therapy WFL for tasks assessed/performed  Overall Cognitive Status Impaired/Different from baseline  Area of Impairment Safety/judgement;Problem solving;Following commands;Attention;Awareness  Current Attention Level Sustained  Following Commands Follows one step commands with increased time  Safety/Judgement Decreased awareness of safety  Awareness Intellectual  Problem Solving Difficulty sequencing;Requires verbal cues;Requires tactile cues  Bed Mobility  Overal bed mobility Needs Assistance  Bed Mobility Sidelying to Sit  Sidelying to sit Max assist  Transfers  Overall transfer level Needs assistance  Equipment used None  Transfers Squat Pivot Transfers  Squat pivot transfers Total assist  General transfer comment face to face assist of 2 persons to get to the [facility].  Pt having difficulty getting knees extended.  Balance  Overall balance assessment Needs assistance  Sitting-balance support Feet supported  Sitting balance-Leahy Scale Fair  Standing balance-Leahy Scale Zero  General Comments  General comments (skin integrity, edema, etc.) placed pad between brace and skin as pt declined wearing second gown; noted  continued drainage from incision  PT - End of Session  Equipment Utilized During Treatment Back brace  Activity Tolerance Patient tolerated treatment well;Patient limited by pain  Patient left Other (comment);with call bell/phone within reach;with nursing/sitter in room (sitting on the [facility])  Nurse Communication Mobility status   PT - Assessment/Plan  PT Plan Current plan remains appropriate  PT Visit Diagnosis Muscle weakness (generalized) (M62.81);Other abnormalities of gait and mobility (R26.89);Unsteadiness on feet (R26.81)  PT Frequency (ACUTE ONLY) Min 5X/week  Follow Up Recommendations SNF;Supervision/Assistance - 24 hour  PT equipment None recommended by PT  AM-PAC PT "6 Clicks" Mobility Outcome Measure (Version 2)  Help needed turning from your back to your side while in a flat bed without using bedrails? 2  Help needed moving from lying on your back to sitting on the side of a flat bed without using bedrails? 2  Help needed moving to and from a bed to a chair (including a wheelchair)? 1  Help needed standing up from a chair using your arms (e.g., wheelchair or bedside chair)? 1  Help needed to walk in hospital room? 1  Help needed climbing 3-5 steps with a railing?  1  6 Click Score 8  Consider Recommendation of Discharge To: CIR/SNF/LTACH  PT Goal Progression  Progress towards PT goals Progressing toward goals  Acute Rehab PT Goals  PT Goal Formulation With patient  Time For Goal Achievement 07/10/19  Potential to Achieve Goals Fair  PT Time Calculation  PT Start Time (ACUTE ONLY) 1505  PT Stop Time (ACUTE ONLY) 1514  PT Time Calculation (min) (ACUTE ONLY) 9 min  PT General Charges  $$ ACUTE PT VISIT 1 Visit  PT Treatments  $Therapeutic Activity 8-22 mins   06/30/2019  Ginger Carne., PT Acute Rehabilitation Services [phone]  (pager) [phone]  (  office)

## 2019-06-30 NOTE — Progress Notes (Addendum)
Subjective: The patient is alert and pleasant.  She has not ambulated.  She denies headaches.  She wants to take a shower.  She wants to be transferred to a skilled nursing facility in [place].  Objective: Vital signs in last 24 hours: Temp:  [98.4 F (36.9 C)-99.7 F (37.6 C)] 98.6 F (37 C) (06/28 0436) Pulse Rate:  [66-76] 66 (06/28 0436) Resp:  [16-19] 17 (06/28 0436) BP: (110-146)/(46-104) 137/46 (06/28 0436) SpO2:  [96 %-99 %] 96 % (06/28 0436) Estimated body mass index is 41.97 kg/m as calculated from the following:   Height as of 06/23/19: 5\' 7"  (1.702 m).   Weight as of 06/23/19: 121.6 kg.   Intake/Output from previous day: 06/27 0701 - 06/28 0700 In: -  Out: 1600 [Urine:1600] Intake/Output this shift: No intake/output data recorded.  Physical exam the patient is alert and oriented.  Her dressing was changed last night and has some serosanguineous drainage.  Her lower extremity strength is grossly normal except for some giveaway.  Lab Results: No results for input(s): WBC, HGB, HCT, PLT in the last 72 hours. BMET No results for input(s): NA, K, CL, CO2, GLUCOSE, BUN, CREATININE, CALCIUM in the last 72 hours.  Studies/Results: No results found.  Assessment/Plan: Postop day #5: I have encouraged the patient to ambulate.  She can take a shower with a honeycomb dressing on.  Wound drainage: This appears to be serosanguineous drainage.  She does not have headaches so I do not think this is spinal fluid.  I will start her on empiric Keflex and continue dressing changes.  LOS: 5 days     Ophelia Charter 06/30/2019, 8:07 AM

## 2019-07-01 NOTE — Progress Notes (Signed)
Pt. Assisted OOB and to the chair using the hoyer lift. Writer encouraged patient to walk/stand pivot to the chair. Pt declined stating she knows her body and knows that she is unable to walk at this time and feels more comfortable using the hoyer lift.

## 2019-07-01 NOTE — Progress Notes (Signed)
Physical Therapy Treatment Patient Details Name: Selena Rodgers MRN: [id number] DOB: December 27, 1950 Today's Date: 07/01/2019    History of Present Illness Pt is a 69 yo female s/p L2-5 transforaminal lumbar interbody fusion on 6/23. PMH includes ACDF, obesity, cholecystectomy, ventral hernia.    PT Comments    Encouraged pt to push past the L hip pain and worked on 4 standing trials.    Follow Up Recommendations  SNF;Supervision/Assistance - 24 hour     Equipment Recommendations  None recommended by PT    Recommendations for Other Services       Precautions / Restrictions Precautions Precautions: Fall;Back Precaution Booklet Issued: Yes (comment) Required Braces or Orthoses: Spinal Brace Spinal Brace: Thoracolumbosacral orthotic;Applied in sitting position    Mobility  Bed Mobility               General bed mobility comments: pt in the recliner on arrival  Transfers Overall transfer level: Needs assistance Equipment used: 2 person hand held assist;Rolling walker (2 wheeled) Transfers: Sit to/from Stand Sit to Stand: Max assist;+2 physical assistance         General transfer comment: pt completed 4 submaximal sit to stands at edge of the chair, 2 standing trials with the RW and 2 face to face assisted stands, all with the assist of the pad  Ambulation/Gait             General Gait Details: unable   Stairs             Wheelchair Mobility    Modified Rankin (Stroke Patients Only)       Balance Overall balance assessment: Needs assistance Sitting-balance support: Feet supported Sitting balance-Leahy Scale: Fair     Standing balance support: Bilateral upper extremity supported;During functional activity Standing balance-Leahy Scale: Zero Standing balance comment: requires max +2 to stand                            Cognition Arousal/Alertness: Awake/alert Behavior During Therapy: WFL for tasks assessed/performed Overall  Cognitive Status: Impaired/Different from baseline Area of Impairment: Safety/judgement;Problem solving;Following commands;Attention;Awareness                   Current Attention Level: Focused   Following Commands: Follows one step commands with increased time Safety/Judgement: Decreased awareness of safety Awareness: Intellectual Problem Solving: Requires verbal cues;Requires tactile cues;Difficulty sequencing General Comments: pt easily distracted, needing MAX cues to focus on session. very insistent on doing things her way, needs increased time to process and follow cues      Exercises      General Comments        Pertinent Vitals/Pain Pain Assessment: Faces Faces Pain Scale: Hurts even more Pain Location: back; L hip Pain Descriptors / Indicators: Aching;Discomfort;Sore Pain Intervention(s): Premedicated before session;Monitored during session;Limited activity within patient's tolerance    Home Living                      Prior Function            PT Goals (current goals can now be found in the care plan section) Acute Rehab PT Goals Patient Stated Goal: to do more for herself PT Goal Formulation: With patient Time For Goal Achievement: 07/10/19 Potential to Achieve Goals: Fair Progress towards PT goals: Progressing toward goals (slow)    Frequency    Min 5X/week      PT Plan Current plan remains appropriate  Co-evaluation              AM-PAC PT "6 Clicks" Mobility   Outcome Measure  Help needed turning from your back to your side while in a flat bed without using bedrails?: A Lot Help needed moving from lying on your back to sitting on the side of a flat bed without using bedrails?: A Lot Help needed moving to and from a bed to a chair (including a wheelchair)?: Total Help needed standing up from a chair using your arms (e.g., wheelchair or bedside chair)?: Total Help needed to walk in hospital room?: Total Help needed  climbing 3-5 steps with a railing? : Total 6 Click Score: 8    End of Session Equipment Utilized During Treatment: Back brace Activity Tolerance: Patient tolerated treatment well;Patient limited by pain Patient left: with call bell/phone within reach;in chair Nurse Communication: Mobility status PT Visit Diagnosis: Muscle weakness (generalized) (M62.81);Other abnormalities of gait and mobility (R26.89);Unsteadiness on feet (R26.81)     Time: 2103-1281 PT Time Calculation (min) (ACUTE ONLY): 31 min  Charges:  $Therapeutic Activity: 23-37 mins                     07/01/2019  Ginger Carne., PT Acute Rehabilitation Services [phone]  (pager) [phone]  (office)   Selena Rodgers 07/01/2019, 5:45 PM

## 2019-07-01 NOTE — Progress Notes (Signed)
Subjective: The patient is alert and pleasant.  She is anxious to get to rehab in [place].  She was again slow to mobilize yesterday with PT.  Objective: Vital signs in last 24 hours: Temp:  [98 F (36.7 C)-99.2 F (37.3 C)] 98.6 F (37 C) (06/29 0733) Pulse Rate:  [65-105] 75 (06/29 0733) Resp:  [16-20] 20 (06/29 0733) BP: (102-154)/(43-70) 137/70 (06/29 0733) SpO2:  [96 %-100 %] 97 % (06/29 0733) Estimated body mass index is 41.97 kg/m as calculated from the following:   Height as of 06/23/19: 5\' 7"  (1.702 m).   Weight as of 06/23/19: 121.6 kg.   Intake/Output from previous day: 06/28 0701 - 06/29 0700 In: 120 [P.O.:120] Out: 2650 [Urine:2650] Intake/Output this shift: No intake/output data recorded.  Physical exam the patient is alert and oriented.  She is moving her lower extremities well.  Her dressing from yesterday is clean and dry.  Lab Results: No results for input(s): WBC, HGB, HCT, PLT in the last 72 hours. BMET No results for input(s): NA, K, CL, CO2, GLUCOSE, BUN, CREATININE, CALCIUM in the last 72 hours.  Studies/Results: No results found.  Assessment/Plan: Postop day #6: The patient is ready for transfer to rehab when a bed is available.  I have again encouraged her to mobilize/ambulate.  I have answered all her questions.  LOS: 6 days     Ophelia Charter 07/01/2019, 7:48 AM

## 2019-07-02 LAB — SARS CORONAVIRUS 2 BY RT PCR (HOSPITAL ORDER, PERFORMED IN ~~LOC~~ HOSPITAL LAB): SARS Coronavirus 2: NEGATIVE

## 2019-07-02 MED FILL — Heparin Sodium (Porcine) Inj 1000 Unit/ML: INTRAMUSCULAR | Qty: 30 | Status: AC

## 2019-07-02 MED FILL — Sodium Chloride IV Soln 0.9%: INTRAVENOUS | Qty: 3000 | Status: AC

## 2019-07-02 NOTE — Progress Notes (Signed)
Physical Therapy Treatment Patient Details Name: Selena Rodgers MRN: [id number] DOB: 29-Jun-1950 Today's Date: 07/02/2019    History of Present Illness Pt is a 69 yo female s/p L2-5 transforaminal lumbar interbody fusion on 6/23. PMH includes ACDF, obesity, cholecystectomy, ventral hernia.    PT Comments    Pt making slow progress toward goals and needs encouragement to complete enough activity to continue to progress.  Emphasis on transitions to EOB, balance and sit to stands with primary stress on use of the RW, but then face to face standing assist when AD doesn't work enough.    Follow Up Recommendations  SNF;Supervision/Assistance - 24 hour     Equipment Recommendations  None recommended by PT    Recommendations for Other Services       Precautions / Restrictions Precautions Precautions: Fall;Back Precaution Booklet Issued: Yes (comment) Required Braces or Orthoses: Spinal Brace Spinal Brace: Applied in sitting position;Lumbar corset    Mobility  Bed Mobility Overal bed mobility: Needs Assistance Bed Mobility: Rolling;Sidelying to Sit;Sit to Sidelying Rolling: Min assist Sidelying to sit: Mod assist;+2 for safety/equipment     Sit to sidelying: Mod assist;+2 for safety/equipment    Transfers Overall transfer level: Needs assistance Equipment used: 2 person hand held assist;Rolling walker (2 wheeled) Transfers: Sit to/from Stand Sit to Stand: Max assist;+2 physical assistance         General transfer comment: Again pt completed 4 submaximal sit to stands at edge of the chair, 2 standing trials with the RW and 2 face to face assisted stands, all with the assist of the pad  Ambulation/Gait             General Gait Details: unable   Stairs             Wheelchair Mobility    Modified Rankin (Stroke Patients Only)       Balance Overall balance assessment: Needs assistance Sitting-balance support: Feet supported Sitting balance-Leahy  Scale: Fair     Standing balance support: Bilateral upper extremity supported;During functional activity Standing balance-Leahy Scale: Zero Standing balance comment: requires max +2 to stand                            Cognition Arousal/Alertness: Awake/alert Behavior During Therapy: WFL for tasks assessed/performed Overall Cognitive Status: Within Functional Limits for tasks assessed                                        Exercises      General Comments        Pertinent Vitals/Pain Pain Assessment: Faces Faces Pain Scale: Hurts even more Pain Location: back; L hip Pain Descriptors / Indicators: Aching;Discomfort;Sore Pain Intervention(s): Premedicated before session;Monitored during session    Home Living                      Prior Function            PT Goals (current goals can now be found in the care plan section) Acute Rehab PT Goals Patient Stated Goal: to do more for herself PT Goal Formulation: With patient Time For Goal Achievement: 07/10/19 Potential to Achieve Goals: Fair Progress towards PT goals: Progressing toward goals (slow)    Frequency    Min 5X/week      PT Plan Current plan remains appropriate  Co-evaluation              AM-PAC PT "6 Clicks" Mobility   Outcome Measure  Help needed turning from your back to your side while in a flat bed without using bedrails?: A Lot Help needed moving from lying on your back to sitting on the side of a flat bed without using bedrails?: A Lot Help needed moving to and from a bed to a chair (including a wheelchair)?: Total Help needed standing up from a chair using your arms (e.g., wheelchair or bedside chair)?: Total Help needed to walk in hospital room?: Total Help needed climbing 3-5 steps with a railing? : Total 6 Click Score: 8    End of Session Equipment Utilized During Treatment: Back brace Activity Tolerance: Patient tolerated treatment  well;Patient limited by pain Patient left: with call bell/phone within reach;in chair Nurse Communication: Mobility status PT Visit Diagnosis: Muscle weakness (generalized) (M62.81);Other abnormalities of gait and mobility (R26.89)     Time: 1188-6773 PT Time Calculation (min) (ACUTE ONLY): 25 min  Charges:  $Therapeutic Activity: 23-37 mins                     07/02/2019  Ginger Carne., PT Acute Rehabilitation Services [phone]  (pager) [phone]  (office)   [name] 07/02/2019, 6:56 PM

## 2019-07-02 NOTE — Progress Notes (Signed)
Subjective: The patient is alert and pleasant.  She looks and feels better.  She is anxious to go to rehab.  Objective: Vital signs in last 24 hours: Temp:  [98.4 F (36.9 C)-99 F (37.2 C)] 99 F (37.2 C) (06/30 1602) Pulse Rate:  [68-79] 76 (06/30 1602) Resp:  [18-20] 18 (06/30 1602) BP: (106-127)/(42-61) 127/61 (06/30 1602) SpO2:  [95 %-98 %] 95 % (06/30 1602) Estimated body mass index is 41.97 kg/m as calculated from the following:   Height as of 06/23/19: 5\' 7"  (1.702 m).   Weight as of 06/23/19: 121.6 kg.   Intake/Output from previous day: 06/29 0701 - 06/30 0700 In: 720 [P.O.:720] Out: 700 [Urine:700] Intake/Output this shift: No intake/output data recorded.  Physical exam the patient is alert and pleasant.  Her dressing is clean and dry.  Her lower extremity strength is grossly normal.  Lab Results: No results for input(s): WBC, HGB, HCT, PLT in the last 72 hours. BMET No results for input(s): NA, K, CL, CO2, GLUCOSE, BUN, CREATININE, CALCIUM in the last 72 hours.  Studies/Results: No results found.  Assessment/Plan: Postop day #7: We are still awaiting skilled nursing facility placement.  LOS: 7 days     Ophelia Charter 07/02/2019, 7:41 PM

## 2019-07-02 NOTE — TOC Progression Note (Signed)
Transition of Care [facility]) - Progression Note    Patient Details  Name: [name] MRN: [id number] Date of Birth: May 23, 1950  [facility] of [facility]) CM/SW Contact  Pollie Friar, RN Phone Number: 07/02/2019, 11:53 AM  Clinical Narrative:    CM has met with the patient this am as she is very upset about not getting to rehab yet. CM reached out to [facility] and they have not received auth for SNF rehab. The patient asked CM to please assist her in calling [employer]. CM assisted and [employer] asked that the medical team call them with updated information. CM has done this and faxed them clinical information. CM has asked they expedite the re [facility].  CM has asked for covid test in case we get authorization back today.  TOC following.   Expected Discharge Plan: [place] Barriers to Discharge: Continued Medical Work up  Expected Discharge Plan and Services Expected Discharge Plan: [place] In-house Referral: Clinical Social Work Discharge Planning Services: CM Consult Post Acute Care Choice: [place] arrangements for the past 2 months: Single Family Home                                       Social Determinants of Health (SDOH) Interventions    Readmission Risk Interventions No flowsheet data found.

## 2019-07-02 NOTE — Progress Notes (Signed)
   Providing Compassionate, Quality Care - Together   Subjective: Patient reports she is anxious to get to [facility]. She tells me her surgical wound has not drained today. Her dressing was changed earlier today by the nursing staff.  Objective: Vital signs in last 24 hours: Temp:  [98 F (36.7 C)-99 F (37.2 C)] 99 F (37.2 C) (06/30 1602) Pulse Rate:  [68-83] 76 (06/30 1602) Resp:  [18-20] 18 (06/30 1602) BP: (106-142)/(42-63) 127/61 (06/30 1602) SpO2:  [95 %-99 %] 95 % (06/30 1602)  Intake/Output from previous day: 06/29 0701 - 06/30 0700 In: 720 [P.O.:720] Out: 700 [Urine:700] Intake/Output this shift: Total I/O In: 240 [P.O.:240] Out: 900 [Urine:900]  Alert and oriented x 4 PERRLA CN II-XII grossly intact MAE Dressing is clean, dry, and intact  Lab Results: No results for input(s): WBC, HGB, HCT, PLT in the last 72 hours. BMET No results for input(s): NA, K, CL, CO2, GLUCOSE, BUN, CREATININE, CALCIUM in the last 72 hours.  Studies/Results: No results found.  Assessment/Plan: Patient is 7 days status post three level lumbar fusion by [name]. She is ready for transfer to SNF.   LOS: 7 days    -Plan is to discharge tomorrow once insurance has approved -Encourage patient to mobilize   Viona Gilmore, DNP, AGNP-C Nurse Practitioner  [facility] [place]. [address], [place], [place] [address] P: [phone]    F: [phone]  07/02/2019, 5:09 PM

## 2019-07-03 NOTE — Progress Notes (Signed)
Physical Therapy Treatment Patient Details Name: Selena Rodgers MRN: [id number] DOB: [birth year] Today's Date: 07/03/2019    History of Present Illness Pt is a 69 yo female s/p L2-5 transforaminal lumbar interbody fusion on 6/23. PMH includes ACDF, obesity, cholecystectomy, ventral hernia.    PT Comments    Patient seen for mobility progression. Pt requires +2 assist for all mobility this session. Attempted sit to stands X 2 trials however pt unable to lift buttocks from EOB. Pt able to later scoot EOB to drop arm recliner with min-mod A +2. Continue to progress as tolerated with anticipated d/c to SNF for further skilled PT services.      Follow Up Recommendations  SNF;Supervision/Assistance - 24 hour     Equipment Recommendations  None recommended by PT    Recommendations for Other Services       Precautions / Restrictions Precautions Precautions: Fall;Back Precaution Booklet Issued: Yes (comment) Precaution Comments: reviewed with pt throughout Required Braces or Orthoses: Spinal Brace Spinal Brace: Applied in sitting position;Lumbar corset Restrictions Weight Bearing Restrictions: No    Mobility  Bed Mobility Overal bed mobility: Needs Assistance Bed Mobility: Rolling;Sidelying to Sit Rolling: Min assist Sidelying to sit: +2 for physical assistance;Max assist       General bed mobility comments: cues for sequencing and use of rail goint toward L side; pt given cues to push throw L UE and pt reports "oh but thats my bad elbow"; assistance required to bring hips/bilat LE to EOB and to elevate trunk into sitting   Transfers Overall transfer level: Needs assistance Equipment used: Rolling walker (2 wheeled) Transfers: Sit to/from Stand;Lateral/Scoot Transfers Sit to Stand: Total assist;+2 physical assistance;From elevated surface        Lateral/Scoot Transfers: Mod assist;+2 safety/equipment;Min assist;+2 physical assistance General transfer comment: attempted to  stand X 2 trials with total A +2 however pt unable to power up and lift buttocks from EOB; pt educated on technique, positioning, and cues for sequencing to laterally scoot to drop arm recliner; min-mod A and use of bed pad required to scoot  Ambulation/Gait             General Gait Details: unable   Stairs             Wheelchair Mobility    Modified Rankin (Stroke Patients Only)       Balance Overall balance assessment: Needs assistance Sitting-balance support: Feet supported Sitting balance-Leahy Scale: Fair   Postural control: Posterior lean Standing balance support: Bilateral upper extremity supported;During functional activity Standing balance-Leahy Scale: Zero Standing balance comment: unable to stand to date                            Cognition Arousal/Alertness: Awake/alert Behavior During Therapy: WFL for tasks assessed/performed Overall Cognitive Status: Impaired/Different from baseline Area of Impairment: Safety/judgement;Problem solving                   Current Attention Level: Focused   Following Commands: Follows one step commands with increased time Safety/Judgement: Decreased awareness of safety Awareness: Intellectual Problem Solving: Requires verbal cues;Requires tactile cues;Difficulty sequencing General Comments: pt requiring significant cues to sequence throughout session, often interupting therapists in the middle of sequencing but asking the very question the therapist was attempting to explain      Exercises      General Comments        Pertinent Vitals/Pain Pain Assessment: Faces Faces Pain Scale: Hurts  even more Pain Location: back; L hip Pain Descriptors / Indicators: Aching;Discomfort;Sore Pain Intervention(s): Limited activity within patient's tolerance;Monitored during session;Repositioned    Home Living                      Prior Function            PT Goals (current goals can now be  found in the care plan section) Acute Rehab PT Goals Patient Stated Goal: to do more for herself Progress towards PT goals: Progressing toward goals    Frequency    Min 5X/week      PT Plan Current plan remains appropriate    Co-evaluation PT/OT/SLP Co-Evaluation/Treatment: Yes Reason for Co-Treatment: For patient/therapist safety;To address functional/ADL transfers PT goals addressed during session: Mobility/safety with mobility;Balance;Proper use of DME OT goals addressed during session: ADL's and self-care;Strengthening/ROM      AM-PAC PT "6 Clicks" Mobility   Outcome Measure  Help needed turning from your back to your side while in a flat bed without using bedrails?: A Lot Help needed moving from lying on your back to sitting on the side of a flat bed without using bedrails?: A Lot Help needed moving to and from a bed to a chair (including a wheelchair)?: A Lot Help needed standing up from a chair using your arms (e.g., wheelchair or bedside chair)?: Total Help needed to walk in hospital room?: Total Help needed climbing 3-5 steps with a railing? : Total 6 Click Score: 9    End of Session Equipment Utilized During Treatment: Back brace Activity Tolerance: Patient tolerated treatment well Patient left: with call bell/phone within reach;in chair;with chair alarm set Nurse Communication: Mobility status;Need for lift equipment PT Visit Diagnosis: Muscle weakness (generalized) (M62.81);Other abnormalities of gait and mobility (R26.89)     Time: 1000-1041 PT Time Calculation (min) (ACUTE ONLY): 41 min  Charges:  $Therapeutic Activity: 23-37 mins                     Selena Rodgers, Selena Rodgers Acute Rehabilitation Services Pager: [phone] Office: [phone]     Selena Rodgers 07/03/2019, 4:05 PM

## 2019-07-03 NOTE — Progress Notes (Signed)
Occupational Therapy Treatment Patient Details Name: Selena Rodgers MRN: [id number] DOB: [birth year] Today's Date: 07/03/2019    History of present illness Pt is a 69 yo female s/p L2-5 transforaminal lumbar interbody fusion on 6/23. PMH includes ACDF, obesity, cholecystectomy, ventral hernia.   OT comments  Patient continues to make minimal progress towards goals in skilled OT session. Patient's session encompassed cotreat with PT in order to address functional deficits, as pt is a heavy max-total plus 2 whose participation waxes and wanes in the middle of the session. While pt is oriented, pt required multi-modal cues to sequence with regard to bed mobility, sit to stand transfers, and incremental scooting to the chair. Despite encouragement, pt appeared to be confused when therapists were promoting the patient to do as much as possible, often stating "1,2,3, go" and then not initiating any sort of movement. While pt remains motivated to work with therapy, she is unaware of her current deficits as she is expressing interest in showering and has yet to ambulate. Discharge recommendations remain appropriate at this time; will continue to follow acutely.    Follow Up Recommendations  SNF;Supervision/Assistance - 24 hour    Equipment Recommendations  None recommended by OT    Recommendations for Other Services      Precautions / Restrictions Precautions Precautions: Fall;Back Precaution Booklet Issued: Yes (comment) Precaution Comments: reviewed with pt throughout Required Braces or Orthoses: Spinal Brace Spinal Brace: Applied in sitting position;Lumbar corset Restrictions Weight Bearing Restrictions: No       Mobility Bed Mobility Overal bed mobility: Needs Assistance Bed Mobility: Rolling;Sidelying to Sit;Sit to Sidelying Rolling: Min assist Sidelying to sit: +2 for physical assistance;Max assist       General bed mobility comments: Pt requiring max A of 2 to sequence  appropriately, and when cued to use a specific appendage to assist would state "oh but thats my bad elbow" etc etc  Transfers Overall transfer level: Needs assistance Equipment used: 2 person hand held assist;Rolling walker (2 wheeled) Transfers: Sit to/from Stand Sit to Stand: Total assist;+2 safety/equipment;+2 physical assistance         General transfer comment: Pt and therapists attempted sit to stand x2 in session, however it became clear that pt wanted significant help to power up and was not putting a lot of weight into her legs, due to safety concerns, pt instructed how to complete an incremental scoot to the chair, pt was able to complete this with mod verbal cues however on three separate occasions counted "1,2,3 go" for the therapists to move her instead of engaging to move    Balance Overall balance assessment: Needs assistance Sitting-balance support: Feet supported Sitting balance-Leahy Scale: Fair   Postural control: Posterior lean Standing balance support: Bilateral upper extremity supported;During functional activity Standing balance-Leahy Scale: Zero Standing balance comment: unable to stand to date                           ADL either performed or assessed with clinical judgement   ADL Overall ADL's : Needs assistance/impaired     Grooming: Set up;Sitting;Brushing hair               Lower Body Dressing: Total assistance;Bed level Lower Body Dressing Details (indicate cue type and reason): to don shoes and socks Toilet Transfer: Total assistance;+2 for physical assistance;+2 for safety/equipment;Maximal assistance;Stand-pivot;Cueing for safety;Cueing for sequencing Toilet Transfer Details (indicate cue type and reason): simulated with incremental scoots to chair  as pt's participation in session waxed and waned consistently; making it unsafe to complete a stand pivot         Functional mobility during ADLs: +2 for physical assistance;Maximal  assistance;Total assistance General ADL Comments: pt continues to present with cognitive impairments, back precautions, generalized weakness, decreased ability to care for self and pain, pt often in session would state "1,2,3, go!" and then provide no assist to help in movement     Vision       Perception     Praxis      Cognition Arousal/Alertness: Awake/alert Behavior During Therapy: [facility] for tasks assessed/performed Overall Cognitive Status: Impaired/Different from baseline Area of Impairment: Safety/judgement;Problem solving;Following commands;Attention;Awareness                   Current Attention Level: Focused   Following Commands: Follows one step commands with increased time Safety/Judgement: Decreased awareness of safety Awareness: Intellectual Problem Solving: Requires verbal cues;Requires tactile cues;Difficulty sequencing General Comments: pt requiring significant cues to sequence throughout session, often interupting therapists in the middle of sequencing but asking the very question the therapist was attempting to explain        Exercises     Shoulder Instructions       General Comments      Pertinent Vitals/ Pain       Pain Assessment: Faces Faces Pain Scale: Hurts even more Pain Location: back; L hip Pain Descriptors / Indicators: Aching;Discomfort;Sore Pain Intervention(s): Limited activity within patient's tolerance;Monitored during session;Repositioned  Home Living                                          Prior Functioning/Environment              Frequency  Min 2X/week        Progress Toward Goals  OT Goals(current goals can now be found in the care plan section)  Progress towards OT goals: Progressing toward goals  Acute Rehab OT Goals Patient Stated Goal: to do more for herself OT Goal Formulation: With patient Time For Goal Achievement: 07/10/19 Potential to Achieve Goals: Good  Plan Discharge plan  remains appropriate;Frequency remains appropriate    Co-evaluation    PT/OT/SLP Co-Evaluation/Treatment: Yes Reason for Co-Treatment: Complexity of the patient's impairments (multi-system involvement);Necessary to address cognition/behavior during functional activity;For patient/therapist safety;To address functional/ADL transfers PT goals addressed during session: Mobility/safety with mobility;Strengthening/ROM OT goals addressed during session: ADL's and self-care;Strengthening/ROM      AM-PAC OT "6 Clicks" Daily Activity     Outcome Measure   Help from another person eating meals?: None Help from another person taking care of personal grooming?: A Little Help from another person toileting, which includes using toliet, bedpan, or urinal?: Total Help from another person bathing (including washing, rinsing, drying)?: A Lot Help from another person to put on and taking off regular upper body clothing?: A Lot Help from another person to put on and taking off regular lower body clothing?: Total 6 Click Score: 13    End of Session Equipment Utilized During Treatment: Back brace  OT Visit Diagnosis: Unsteadiness on feet (R26.81);Other abnormalities of gait and mobility (R26.89);Muscle weakness (generalized) (M62.81);Repeated falls (R29.6);Pain Pain - part of body:  (Back)   Activity Tolerance Patient limited by pain   Patient Left in chair;with call bell/phone within reach   Nurse Communication Mobility status;Need for lift equipment  Time: 1000-1041 OT Time Calculation (min): 41 min  Charges: OT General Charges $OT Visit: 1 Visit OT Treatments $Self Care/Home Management : 8-22 mins  Corinne Ports E. Daiel Strohecker, COTA/L Acute Rehabilitation Services [place] 07/03/2019, 12:58 PM

## 2019-07-03 NOTE — Progress Notes (Signed)
Subjective: The patient is alert and pleasant.  She is looking forward to being transferred to a [facility] skilled nursing facility.  She feels she is getting stronger.  Objective: Vital signs in last 24 hours: Temp:  [98.3 F (36.8 C)-99 F (37.2 C)] 98.8 F (37.1 C) (07/01 0744) Pulse Rate:  [72-82] 75 (07/01 0744) Resp:  [17-20] 18 (07/01 0744) BP: (102-156)/(47-70) 102/64 (07/01 0744) SpO2:  [93 %-98 %] 94 % (07/01 0744) Estimated body mass index is 41.97 kg/m as calculated from the following:   Height as of 06/23/19: 5\' 7"  (1.702 m).   Weight as of 06/23/19: 121.6 kg.   Intake/Output from previous day: 06/30 0701 - 07/01 0700 In: 1120 [P.O.:1120] Out: 2250 [Urine:2250] Intake/Output this shift: No intake/output data recorded.  Physical exam the patient is alert and pleasant.  Her dressing which was changed yesterday is clean and dry.  She is moving her lower extremities well.  Lab Results: No results for input(s): WBC, HGB, HCT, PLT in the last 72 hours. BMET No results for input(s): NA, K, CL, CO2, GLUCOSE, BUN, CREATININE, CALCIUM in the last 72 hours.  Studies/Results: No results found.  Assessment/Plan: Postop day #8: Perhaps today she will go to rehab/skilled nursing facility.  I gave her discharge instructions and answered all her questions.  LOS: 8 days     Ophelia Charter 07/03/2019, 9:14 AM

## 2019-07-03 NOTE — Progress Notes (Signed)
Has orders to walk her in the hallway but she is unable to stand at this time.  She said she could sit on the side of the bed for a few seconds with therapy assist but definitely could not walk and therapy is aware and it is noted in their documentation.  She did attempt to sit up on the side of the bed but was unable to do so then she gave up.  Has been using IS and is encouraged to help mobilize in the bed.

## 2019-07-04 MED ORDER — DOCUSATE SODIUM 100 MG PO CAPS: 100.0000 mg | ORAL_CAPSULE | Freq: Two times a day (BID) | ORAL | 0 refills | Status: DC

## 2019-07-04 MED ORDER — OXYCODONE HCL 5 MG PO TABS: 5.0000 mg | ORAL_TABLET | ORAL | 0 refills | Status: DC | PRN

## 2019-07-04 MED ORDER — CEPHALEXIN 500 MG PO CAPS: 1000.0000 mg | ORAL_CAPSULE | Freq: Four times a day (QID) | ORAL | 0 refills | Status: AC

## 2019-07-04 MED ORDER — CYCLOBENZAPRINE HCL 10 MG PO TABS: 10.0000 mg | ORAL_TABLET | Freq: Three times a day (TID) | ORAL | 0 refills | Status: DC | PRN

## 2019-07-04 NOTE — Discharge Summary (Signed)
Physician Discharge Summary     Providing Compassionate, Quality Care - Together   Patient ID: Selena Rodgers MRN: [id number] DOB/AGE: 04-27-1950 69 y.o.  Admit date: 06/25/2019 Discharge date: 07/04/2019  Admission Diagnoses: Spondylolisthesis, lumbar region  Discharge Diagnoses:  Active Problems:   Spondylolisthesis, lumbar region   Discharged Condition: good  Hospital Course: Patient was admitted to [facility] following three level posterior lumbar fusion by [name] on 06/25/2019. Her postoperative course has been complicated by issues with mobility that were present pre-operatively. She has worked with both physical and occupational therapies who are recommending a skilled nursing facility at discharge. She needs 2+ assist for standing. She is tolerating a normal diet. She is not having any bowel or bladder dysfunction. Her pain is well-controlled with oral pain medication. He is ready for discharge to SNF.   Consults: rehabilitation medicine  Significant Diagnostic Studies: radiology: DG Lumbar Spine 2-3 Views  Result Date: 06/25/2019 CLINICAL DATA:  Posterior surgery/fusion L2-L5 EXAM: LUMBAR SPINE - 2-3 VIEW; DG C-ARM 1-60 MIN COMPARISON:  Intraoperative images of 06/25/2019 FINDINGS: Pedicle screws are present at 4 levels of the lumbar spine with intervening disc prostheses. Incomplete visualization of landmarks, unable to accurately assign levels. IMPRESSION: BILATERAL pedicle screws at 4 levels of the lumbar spine with intervening disc prostheses. Electronically Signed   By: Lavonia Dana M.D.   On: 06/25/2019 15:52   DG Lumbar Spine 2-3 Views  Result Date: 06/25/2019 CLINICAL DATA:  Localization in OR EXAM: LUMBAR SPINE - 2-3 VIEW COMPARISON:  Portable exam 0840 hours compared to 05/16/2019 FINDINGS: Five lumbar vertebra present on prior prior radiographs with a partially lumbarized S1. Recommend correlation of this numbering system with that used on any prior outside imaging  to ensure consistency. Metallic probe via dorsal approach projects dorsal to the mid L3 level. Surgical sponge in tissue spreaders are present. IMPRESSION: Dorsal localization of the mid L3 level. Electronically Signed   By: Lavonia Dana M.D.   On: 06/25/2019 15:51   DG C-Arm 1-60 Min  Result Date: 06/25/2019 CLINICAL DATA:  Posterior surgery/fusion L2-L5 EXAM: LUMBAR SPINE - 2-3 VIEW; DG C-ARM 1-60 MIN COMPARISON:  Intraoperative images of 06/25/2019 FINDINGS: Pedicle screws are present at 4 levels of the lumbar spine with intervening disc prostheses. Incomplete visualization of landmarks, unable to accurately assign levels. IMPRESSION: BILATERAL pedicle screws at 4 levels of the lumbar spine with intervening disc prostheses. Electronically Signed   By: Lavonia Dana M.D.   On: 06/25/2019 15:52     Treatments: surgery:  L3-4 and L4-5 laminectomy, bilateral L2-3 Laminotomy/foraminotomies/medial facetectomy to decompress the bilateral L3, L4 and L5 nerve roots(the work required to do this was in addition to the work required to do the posterior lumbar interbody fusion because of the patient's  Severe spinal stenosis, facet arthropathy. Etc. requiring a wide decompression of the nerve roots.); L2-3, L3-4 and L4-5 transforaminal lumbar interbody fusion with bone morphogenic protein soaked collagen sponges, local morselized autograft bone and Zimmer DBM; insertion of interbody prosthesis at L2-3, L3-4 and L4-5(globus peek expandable interbody prosthesis); posterior segmental instrumentation from L2 to L5with globus titanium pedicle screws and rods; posterior lateral arthrodesis at L2-3, L3-4 and L4-5 with bone morphogenic protein soaked collagen sponges, local morselized autograft bone and Zimmer DBM.   Discharge Exam: Blood pressure (!) 144/61, pulse 76, temperature 97.9 F (36.6 C), temperature source Oral, resp. rate 18, SpO2 97 %.   Alert and oriented x 4 PERRLA CN II-XII grossly intact MAE Dressing  is dry and intact. There is a small amount of dried serosanguinous drainage.  Disposition: Discharge disposition: 03-Skilled Nursing Facility        Allergies as of 07/04/2019      Reactions   Chlorpheniramine Anaphylaxis   Linseed Oil Anaphylaxis   Codeine Hives   Flax Seed Oil [bio-flax] Swelling   UNSPECIFIED    Latex Hives, Other (See Comments)   BLISTERS   Tape Other (See Comments)   BLISTERS   Tapentadol Other (See Comments)   BLISTERS   2,4-d Dimethylamine (amisol) Rash   T-DAP   Aspirin Rash      Medication List    STOP taking these medications   methocarbamol 750 MG tablet Commonly known as: ROBAXIN   traMADol 50 MG tablet Commonly known as: ULTRAM     TAKE these medications   acetaminophen 650 MG CR tablet Commonly known as: TYLENOL Take 1,300 mg by mouth in the morning and at bedtime.   cephALEXin 500 MG capsule Commonly known as: KEFLEX Take 2 capsules (1,000 mg total) by mouth every 6 (six) hours for 4 days. (Patient started on 06/30/2019)   citalopram 40 MG tablet Commonly known as: CELEXA Take 1 tablet (40 mg total) by mouth daily. What changed: how much to take   cyclobenzaprine 10 MG tablet Commonly known as: FLEXERIL Take 1 tablet (10 mg total) by mouth 3 (three) times daily as needed for muscle spasms.   docusate sodium 100 MG capsule Commonly known as: COLACE Take 1 capsule (100 mg total) by mouth 2 (two) times daily.   meloxicam 15 MG tablet Commonly known as: MOBIC Take 15 mg by mouth daily.   omeprazole 40 MG capsule Commonly known as: PRILOSEC Take 40 mg by mouth daily.   oxyCODONE 5 MG immediate release tablet Commonly known as: Oxy IR/ROXICODONE Take 1-2 tablets (5-10 mg total) by mouth every 4 (four) hours as needed for moderate pain ((score 4 to 6)).   sodium chloride 0.65 % Soln nasal spray Commonly known as: OCEAN Place 1 spray into both nostrils daily as needed for congestion (dry nose).   vitamin B-12 1000 MCG  tablet Commonly known as: CYANOCOBALAMIN Take 1,000 mcg by mouth daily.       Contact information for follow-up providers    Newman Pies, MD. Schedule an appointment as soon as possible for a visit in 2 week(s).   Specialty: Neurosurgery Contact information: [address] [place] [address] [phone]            Contact information for after-discharge care    Destination    [facility] .   Service: Skilled Nursing Contact information: [address] [place] [phone]                  Signed: Rivky Nettle 07/04/2019, 10:41 AM

## 2019-07-04 NOTE — Discharge Instructions (Signed)
Wound Care Keep incision covered and dry. Keep sterile gauze dressing on wound if it continues to drain. Change dressing twice daily and as needed.    Do not put any creams, lotions, or ointments on incision. Leave steri-strips on back.  They will fall off by themselves. Activity Walk each and every day, increasing distance each day. No lifting greater than 5 lbs. No driving for 2 weeks; may ride as a passenger locally. Diet Resume your normal diet.  Return to Work Will be discussed at your follow up appointment. Call Your Doctor If Any of These Occur Redness, drainage, or swelling at the wound.  Temperature greater than 101 degrees. Severe pain not relieved by pain medication. Incision starts to come apart. Follow Up Appt Call today for appointment in 1-2 weeks [phone]) or for problems.  If you have any hardware placed in your spine, you will need an x-ray before your appointment.

## 2019-07-04 NOTE — TOC Transition Note (Signed)
Transition of Care [facility]) - CM/SW Discharge Note   Patient Details  Name: Selena Rodgers MRN: [id number] Date of Birth: 05-02-1950  [facility] CM/SW Contact:  Pollie Friar, RN Phone Number: 07/04/2019, 12:03 PM   Clinical Narrative:    Pt is discharging to [facility]. She will transport via [facility]. Report called to PTAR and d/c packet at the desk. Bedside RN updated.   Room: 304 Number for report: [phone]   Final next level of care: Skilled Nursing Facility Barriers to Discharge: No Barriers Identified   Patient Goals and CMS Choice   CMS Medicare.gov Compare Post Acute Care list provided to:: Patient Choice offered to / list presented to : Patient  Discharge Placement              Patient chooses bed at: [facility] Patient to be transferred to facility by: [place] Name of family member notified: Pt notified her sister Patient and family notified of of transfer: 07/04/19  Discharge Plan and Services In-house Referral: Clinical Social Work Discharge Planning Services: CM Consult Post Acute Care Choice: [place]                               Social Determinants of Health (SDOH) Interventions     Readmission Risk Interventions No flowsheet data found.

## 2019-07-04 NOTE — Progress Notes (Signed)
Report called to Anderson Malta, LPN at [facility] at [phone]. AVS printed with education provided. Awaiting PTAR.

## 2019-07-09 ENCOUNTER — Encounter (HOSPITAL_COMMUNITY): Payer: Self-pay | Admitting: Neurosurgery

## 2019-07-13 ENCOUNTER — Other Ambulatory Visit: Payer: Self-pay

## 2019-07-13 ENCOUNTER — Inpatient Hospital Stay (HOSPITAL_COMMUNITY)
Admission: AD | Admit: 2019-07-13 | Discharge: 2019-07-28 | DRG: 982 | Disposition: A | Discharge status: Skilled Nursing Facility | Payer: Medicare HMO | Source: Other Acute Inpatient Hospital | Attending: Internal Medicine | Admitting: Internal Medicine

## 2019-07-13 ENCOUNTER — Emergency Department
Admission: EM | Admit: 2019-07-13 | Discharge: 2019-07-13 | Disposition: A | Discharge status: Other Acute Inpatient Hospital | Payer: Medicare HMO | Attending: Emergency Medicine | Admitting: Emergency Medicine

## 2019-07-13 ENCOUNTER — Encounter (HOSPITAL_COMMUNITY): Payer: Self-pay | Admitting: Family Medicine

## 2019-07-13 ENCOUNTER — Emergency Department: Payer: Medicare HMO

## 2019-07-13 DIAGNOSIS — G8929 Other chronic pain: Secondary | ICD-10-CM

## 2019-07-13 DIAGNOSIS — R197 Diarrhea, unspecified: Secondary | ICD-10-CM

## 2019-07-13 DIAGNOSIS — M1711 Unilateral primary osteoarthritis, right knee: Secondary | ICD-10-CM | POA: Diagnosis present

## 2019-07-13 DIAGNOSIS — Z79899 Other long term (current) drug therapy: Secondary | ICD-10-CM

## 2019-07-13 DIAGNOSIS — Z791 Long term (current) use of non-steroidal anti-inflammatories (NSAID): Secondary | ICD-10-CM

## 2019-07-13 DIAGNOSIS — Z833 Family history of diabetes mellitus: Secondary | ICD-10-CM | POA: Diagnosis not present

## 2019-07-13 DIAGNOSIS — M545 Low back pain, unspecified: Secondary | ICD-10-CM

## 2019-07-13 DIAGNOSIS — Z885 Allergy status to narcotic agent status: Secondary | ICD-10-CM

## 2019-07-13 DIAGNOSIS — Z720 Tobacco use: Secondary | ICD-10-CM | POA: Diagnosis present

## 2019-07-13 DIAGNOSIS — K439 Ventral hernia without obstruction or gangrene: Secondary | ICD-10-CM | POA: Diagnosis present

## 2019-07-13 DIAGNOSIS — Z9049 Acquired absence of other specified parts of digestive tract: Secondary | ICD-10-CM | POA: Diagnosis not present

## 2019-07-13 DIAGNOSIS — F4323 Adjustment disorder with mixed anxiety and depressed mood: Secondary | ICD-10-CM | POA: Diagnosis present

## 2019-07-13 DIAGNOSIS — K219 Gastro-esophageal reflux disease without esophagitis: Secondary | ICD-10-CM | POA: Diagnosis present

## 2019-07-13 DIAGNOSIS — R202 Paresthesia of skin: Secondary | ICD-10-CM | POA: Diagnosis present

## 2019-07-13 DIAGNOSIS — Z888 Allergy status to other drugs, medicaments and biological substances status: Secondary | ICD-10-CM | POA: Diagnosis not present

## 2019-07-13 DIAGNOSIS — Z20822 Contact with and (suspected) exposure to covid-19: Secondary | ICD-10-CM | POA: Insufficient documentation

## 2019-07-13 DIAGNOSIS — Z7401 Bed confinement status: Secondary | ICD-10-CM | POA: Diagnosis not present

## 2019-07-13 DIAGNOSIS — R200 Anesthesia of skin: Secondary | ICD-10-CM | POA: Diagnosis present

## 2019-07-13 DIAGNOSIS — F17200 Nicotine dependence, unspecified, uncomplicated: Secondary | ICD-10-CM | POA: Diagnosis present

## 2019-07-13 DIAGNOSIS — Z8249 Family history of ischemic heart disease and other diseases of the circulatory system: Secondary | ICD-10-CM | POA: Diagnosis not present

## 2019-07-13 DIAGNOSIS — F17210 Nicotine dependence, cigarettes, uncomplicated: Secondary | ICD-10-CM | POA: Insufficient documentation

## 2019-07-13 DIAGNOSIS — Z981 Arthrodesis status: Secondary | ICD-10-CM | POA: Diagnosis not present

## 2019-07-13 DIAGNOSIS — R29898 Other symptoms and signs involving the musculoskeletal system: Secondary | ICD-10-CM

## 2019-07-13 DIAGNOSIS — S064X9A Epidural hemorrhage with loss of consciousness of unspecified duration, initial encounter: Secondary | ICD-10-CM

## 2019-07-13 DIAGNOSIS — Z79891 Long term (current) use of opiate analgesic: Secondary | ICD-10-CM

## 2019-07-13 DIAGNOSIS — G9782 Other postprocedural complications and disorders of nervous system: Secondary | ICD-10-CM

## 2019-07-13 DIAGNOSIS — M25569 Pain in unspecified knee: Secondary | ICD-10-CM

## 2019-07-13 DIAGNOSIS — M549 Dorsalgia, unspecified: Secondary | ICD-10-CM | POA: Diagnosis present

## 2019-07-13 DIAGNOSIS — M4802 Spinal stenosis, cervical region: Secondary | ICD-10-CM | POA: Diagnosis not present

## 2019-07-13 DIAGNOSIS — M1990 Unspecified osteoarthritis, unspecified site: Secondary | ICD-10-CM | POA: Diagnosis present

## 2019-07-13 DIAGNOSIS — W19XXXA Unspecified fall, initial encounter: Secondary | ICD-10-CM | POA: Diagnosis present

## 2019-07-13 DIAGNOSIS — I10 Essential (primary) hypertension: Secondary | ICD-10-CM | POA: Diagnosis present

## 2019-07-13 DIAGNOSIS — A0472 Enterocolitis due to Clostridium difficile, not specified as recurrent: Secondary | ICD-10-CM | POA: Diagnosis present

## 2019-07-13 DIAGNOSIS — R220 Localized swelling, mass and lump, head: Secondary | ICD-10-CM

## 2019-07-13 DIAGNOSIS — M48061 Spinal stenosis, lumbar region without neurogenic claudication: Secondary | ICD-10-CM | POA: Diagnosis present

## 2019-07-13 DIAGNOSIS — G8220 Paraplegia, unspecified: Secondary | ICD-10-CM | POA: Diagnosis present

## 2019-07-13 DIAGNOSIS — T8189XA Other complications of procedures, not elsewhere classified, initial encounter: Secondary | ICD-10-CM

## 2019-07-13 DIAGNOSIS — Z6841 Body Mass Index (BMI) 40.0 and over, adult: Secondary | ICD-10-CM | POA: Diagnosis not present

## 2019-07-13 DIAGNOSIS — Z91040 Latex allergy status: Secondary | ICD-10-CM | POA: Diagnosis not present

## 2019-07-13 DIAGNOSIS — S064XAA Epidural hemorrhage with loss of consciousness status unknown, initial encounter: Secondary | ICD-10-CM

## 2019-07-13 DIAGNOSIS — M4800 Spinal stenosis, site unspecified: Secondary | ICD-10-CM | POA: Diagnosis present

## 2019-07-13 DIAGNOSIS — T8189XD Other complications of procedures, not elsewhere classified, subsequent encounter: Secondary | ICD-10-CM | POA: Diagnosis not present

## 2019-07-13 HISTORY — DX: Gastro-esophageal reflux disease without esophagitis: K21.9

## 2019-07-13 HISTORY — DX: Depression, unspecified: F32.A

## 2019-07-13 LAB — CBC WITH DIFFERENTIAL/PLATELET
Abs Immature Granulocytes: 0.06 10*3/uL (ref 0.00–0.07)
Basophils Absolute: 0.1 10*3/uL (ref 0.0–0.1)
Basophils Relative: 1 %
Eosinophils Absolute: 1 10*3/uL — ABNORMAL HIGH (ref 0.0–0.5)
Eosinophils Relative: 7 %
HCT: 35.2 % — ABNORMAL LOW (ref 36.0–46.0)
Hemoglobin: 11.7 g/dL — ABNORMAL LOW (ref 12.0–15.0)
Immature Granulocytes: 1 %
Lymphocytes Relative: 14 %
Lymphs Abs: 1.8 10*3/uL (ref 0.7–4.0)
MCH: 30.1 pg (ref 26.0–34.0)
MCHC: 33.2 g/dL (ref 30.0–36.0)
MCV: 90.5 fL (ref 80.0–100.0)
Monocytes Absolute: 0.7 10*3/uL (ref 0.1–1.0)
Monocytes Relative: 6 %
Neutro Abs: 9.1 10*3/uL — ABNORMAL HIGH (ref 1.7–7.7)
Neutrophils Relative %: 71 %
Platelets: 307 10*3/uL (ref 150–400)
RBC: 3.89 MIL/uL (ref 3.87–5.11)
RDW: 13.4 % (ref 11.5–15.5)
WBC: 12.8 10*3/uL — ABNORMAL HIGH (ref 4.0–10.5)
nRBC: 0 % (ref 0.0–0.2)

## 2019-07-13 LAB — COMPREHENSIVE METABOLIC PANEL
ALT: 8 U/L (ref 0–44)
AST: 18 U/L (ref 15–41)
Albumin: 3.3 g/dL — ABNORMAL LOW (ref 3.5–5.0)
Alkaline Phosphatase: 140 U/L — ABNORMAL HIGH (ref 38–126)
Anion gap: 9 (ref 5–15)
BUN: 14 mg/dL (ref 8–23)
CO2: 25 mmol/L (ref 22–32)
Calcium: 8.6 mg/dL — ABNORMAL LOW (ref 8.9–10.3)
Chloride: 99 mmol/L (ref 98–111)
Creatinine, Ser: 0.72 mg/dL (ref 0.44–1.00)
GFR calc Af Amer: >60 mL/min (ref >60)
GFR calc non Af Amer: >60 mL/min (ref >60)
Glucose, Bld: 133 mg/dL — ABNORMAL HIGH (ref 70–99)
Potassium: 4.8 mmol/L (ref 3.5–5.1)
Sodium: 133 mmol/L — ABNORMAL LOW (ref 135–145)
Total Bilirubin: 0.8 mg/dL (ref 0.3–1.2)
Total Protein: 7.2 g/dL (ref 6.5–8.1)

## 2019-07-13 LAB — SARS CORONAVIRUS 2 BY RT PCR (HOSPITAL ORDER, PERFORMED IN ~~LOC~~ HOSPITAL LAB): SARS Coronavirus 2: NEGATIVE

## 2019-07-13 MED ORDER — ACETAMINOPHEN 325 MG PO TABS
650.0000 mg | ORAL_TABLET | Freq: Three times a day (TID) | ORAL | Status: DC
  Administered 2019-07-13: 650 mg via ORAL
  Filled 2019-07-13 (×2): qty 2

## 2019-07-13 MED ORDER — TRAMADOL HCL 50 MG PO TABS
100.0000 mg | ORAL_TABLET | Freq: Three times a day (TID) | ORAL | Status: DC
  Administered 2019-07-13 – 2019-07-18 (×12): 100 mg via ORAL
  Filled 2019-07-13 (×14): qty 2

## 2019-07-13 MED ORDER — MORPHINE SULFATE (PF) 2 MG/ML IV SOLN: 2.0000 mg | INTRAVENOUS | Status: DC | PRN

## 2019-07-13 MED ORDER — LORAZEPAM 2 MG/ML IJ SOLN
1.0000 mg | Freq: Once | INTRAMUSCULAR | Status: AC | PRN
  Administered 2019-07-13: 1 mg via INTRAVENOUS

## 2019-07-13 MED ORDER — LORAZEPAM 2 MG/ML IJ SOLN
1.0000 mg | Freq: Once | INTRAMUSCULAR | Status: DC | PRN
  Filled 2019-07-13: qty 1

## 2019-07-13 MED ORDER — MELOXICAM 7.5 MG PO TABS
15.0000 mg | ORAL_TABLET | Freq: Every day | ORAL | Status: DC
  Filled 2019-07-13: qty 2

## 2019-07-13 MED ORDER — ALBUTEROL SULFATE (2.5 MG/3ML) 0.083% IN NEBU: 2.5000 mg | INHALATION_SOLUTION | RESPIRATORY_TRACT | Status: DC | PRN

## 2019-07-13 MED ORDER — SODIUM CHLORIDE 0.9 % IV SOLN: INTRAVENOUS | Status: DC

## 2019-07-13 MED ORDER — PROMETHAZINE HCL 25 MG PO TABS: 12.5000 mg | ORAL_TABLET | Freq: Four times a day (QID) | ORAL | Status: DC | PRN

## 2019-07-13 MED ORDER — PANTOPRAZOLE SODIUM 40 MG PO TBEC
40.0000 mg | DELAYED_RELEASE_TABLET | Freq: Every day | ORAL | Status: DC
  Administered 2019-07-15 – 2019-07-28 (×14): 40 mg via ORAL
  Filled 2019-07-13 (×15): qty 1

## 2019-07-13 MED ORDER — CITALOPRAM HYDROBROMIDE 20 MG PO TABS
20.0000 mg | ORAL_TABLET | Freq: Every day | ORAL | Status: DC
  Administered 2019-07-15 – 2019-07-26 (×12): 20 mg via ORAL
  Filled 2019-07-13 (×13): qty 1

## 2019-07-13 NOTE — Plan of Care (Signed)

## 2019-07-13 NOTE — ED Triage Notes (Signed)
Pt BIBA via [facility] w/ CC of unable to care for self or complete ADLs.  Recently had back surgery for bulging disc on 06/23 and sent to rehab facility.  Pt then AMA from rehab and went home.  Now unable to care for self and perform ADLs.  Currently gcs 15 awake/alert x4, PEARL, rr even/unlabored, abd round, obese, soft and nontender. Hyperactive bs x4, skin warm/pink/dry.  VSS, dr. Jari Pigg and team at bedside for assessment.  Pt placed on bedside monitor.

## 2019-07-13 NOTE — ED Notes (Signed)
Pt had bowel movement,  Diaper and chucks pads changed.  Pt cleansed with warm, pre-moistened wipes.  Clean linen, chucks and diaper placed.  Pure wick placed as well.  PT and family updated on plan of care and transfer status.  Verbalized understanding. Will continue to monitor/reassess.

## 2019-07-13 NOTE — H&P (Signed)
History and Physical    Selena Rodgers FUX:[id number] DOB: 1950-02-05 DOA: 07/13/2019  PCP: [name], MD   Patient coming from: [facility] emergency room  Chief Complaint: Weakness of legs with decreased feeling in right leg  HPI: Selena Rodgers is a 69 y.o. female with medical history significant for chronic low back pain, spinal stenosis, large ventral hernia, depression/anxiety, GERD and arthritis.  She underwent laminectomy by [name] on June 23 of this year secondary to lumbar spondylolithiasis and severe spinal stenosis.  She was discharged to a SNF left there AMA after a few days.  She has been at home for the last 9 days.  She reports that since getting home she has been sitting on recliner or bed bound and has not had difficulty ambulating and caring for self.  Her sister was staying with her to help her.  She reports that for the first few days she gets that the recliner and extend and flex her legs to help exercise them for the last 3 days she has not been able to move her right leg.  She is also had tingling and numbness in her right leg from her hip to her ankle. She went to the emergency room at [facility] this morning secondary to weakness and pain in her right leg.  MRI lumbar spine shows postoperative changes with L2-L5 fusion hardware.  Large complex fluid collection in the laminectomy bed from L2-L5 this measures approximately 6.6 x 4.0 x 4.1 cm.  Moderate mass-effect on the posterior aspect of thecal sac and findings worrisome for abscess or CSF leak.  Case was discussed with neurosurgery here at [facility] who recommended patient be transferred to our facility for neurosurgical evaluation.  Reports she has not had any fever, chills, nausea vomiting.  She denies any falls since going home except for when EMS was trying to get her up this morning to go to the emergency room she fell and landed on her knees but did not have  any head injury or loss of consciousness.  She reports her knees are bruised but not in severe pain. She reports she has been having loose watery diarrhea for the last 3 to 4 days.  She reports she has had more than 12 episodes of watery diarrhea a day for the last 2 days.  She has not noticed any blood in stool.  She reports she took a stool softener when she was at the skilled nursing facility but none since she went home.  States she has not taken any laxatives.   Review of Systems:  General: Reports weakness of legs .  Denies fever, chills, weight loss, night sweats.  Denies dizziness.  Denies change in appetite HENT: Denies head trauma, headache, denies change in hearing, tinnitus.  Denies nasal congestion or bleeding.  Denies sore throat, sores in mouth.  Denies difficulty swallowing Eyes: Denies blurry vision, pain in eye, drainage.  Denies discoloration of eyes. Neck: Denies pain.  Denies swelling.  Denies pain with movement. Cardiovascular: Denies chest pain, palpitations.  Denies edema.  Denies orthopnea Respiratory: Denies shortness of breath, cough.  Denies wheezing.  Denies sputum production Gastrointestinal: Denies abdominal pain.  Denies nausea, vomiting.  Denies melena.  Denies hematemesis. Musculoskeletal: Reports chronic low back pain.  Reports limitation of movement of legs right more than left.  Denies deformity or swelling.  Denies arthralgias or myalgias. Genitourinary: Denies pelvic pain.  Denies urinary frequency or hesitancy.  Denies dysuria.  Skin: Denies rash.  Denies petechiae, purpura, ecchymosis. Neurological: Denies headache.  Denies syncope.  Denies seizure activity.  Denies Slurred speech, drooping face.  Denies visual change. Psychiatric: Denies depression, anxiety.  Denies suicidal thoughts or ideation.  Denies hallucinations.  Past Medical History:  Diagnosis Date  . Anxiety   . Arthritis   . Chronic back pain   . Spinal stenosis   . Umbilical hernia   .  Ventral hernia     Past Surgical History:  Procedure Laterality Date  . ANTERIOR CERVICAL DECOMP/DISCECTOMY FUSION N/A 04/19/2015   Procedure: ANTERIOR CERVICAL DECOMPRESSION/DISCECTOMY FUSION INTERBODY PROTHESIS PLATING BONEGRAFT CERVICAL THREE-FOUR ,CERVICAL FOUR-FIVE;  Surgeon: Newman Pies, MD;  Location: [facility];  Service: Neurosurgery;  Laterality: N/A;  . BREAST BIOPSY    . CARPAL TUNNEL RELEASE    . CHOLECYSTECTOMY    . FOOT SURGERY Right   . IRRIGATION AND DEBRIDEMENT ABDOMEN  01/10/2014   Dr. Marina Gravel  . UMBILICAL HERNIA REPAIR    . VENTRAL HERNIA REPAIR  12/26/2013   Dr. Pat Patrick    Social History  reports that she has been smoking. She has never used smokeless tobacco. She reports that she does not drink alcohol and does not use drugs.  Allergies  Allergen Reactions  . Chlorpheniramine Anaphylaxis  . Linseed Oil Anaphylaxis  . Codeine Hives  . Flax Seed Oil [Bio-Flax] Swelling    UNSPECIFIED   . Latex Hives and Other (See Comments)    BLISTERS  . Tape Other (See Comments)    BLISTERS  . Tapentadol Other (See Comments)    BLISTERS  . 2,4-D Dimethylamine (Amisol) Rash    T-DAP  . Aspirin Rash    Family History  Problem Relation Age of Onset  . Diabetes Maternal Grandmother   . Diabetes Maternal Grandfather   . Diabetes Paternal Grandmother   . Diabetes Paternal Grandfather   . Hypertension Mother      Prior to Admission medications   Medication Sig Start Date End Date Taking? Authorizing Provider  acetaminophen (TYLENOL) 650 MG CR tablet Take 1,300 mg by mouth in the morning and at bedtime.    [provider]  citalopram (CELEXA) 40 MG tablet Take 1 tablet (40 mg total) by mouth daily. Patient taking differently: Take 20 mg by mouth daily.  04/28/15   Angiulli, Lavon Paganini, PA-C  cyclobenzaprine (FLEXERIL) 10 MG tablet Take 1 tablet (10 mg total) by mouth 3 (three) times daily as needed for muscle spasms. 07/04/19   Viona Gilmore D, NP  docusate  sodium (COLACE) 100 MG capsule Take 1 capsule (100 mg total) by mouth 2 (two) times daily. 07/04/19   Viona Gilmore D, NP  meloxicam (MOBIC) 15 MG tablet Take 15 mg by mouth daily. 03/28/19   [provider]  omeprazole (PRILOSEC) 40 MG capsule Take 40 mg by mouth daily.     [provider]  oxyCODONE (OXY IR/ROXICODONE) 5 MG immediate release tablet Take 1-2 tablets (5-10 mg total) by mouth every 4 (four) hours as needed for moderate pain ((score 4 to 6)). 07/04/19   Viona Gilmore D, NP  sodium chloride (OCEAN) 0.65 % SOLN nasal spray Place 1 spray into both nostrils daily as needed for congestion (dry nose).    [provider]  vitamin B-12 (CYANOCOBALAMIN) 1000 MCG tablet Take 1,000 mcg by mouth daily.    [provider]    Physical Exam: Vitals:   07/13/19 1920  BP: (!) 139/91  Pulse:  71  Resp: 19  Temp: 98.7 F (37.1 C)  TempSrc: Oral  SpO2: 98%    Constitutional: NAD, calm, comfortable Vitals:   07/13/19 1920  BP: (!) 139/91  Pulse: 71  Resp: 19  Temp: 98.7 F (37.1 C)  TempSrc: Oral  SpO2: 98%   General: WDWN, Alert and oriented x3.  Eyes: EOMI, PERRL, lids and conjunctivae normal.  Sclera nonicteric HENT:  Talty/AT, external ears normal.  Nares patent without epistasis.  Mucous membranes are moist. Posterior pharynx clear of any exudate or lesions. Normal dentition.  Neck: Soft, normal range of motion, supple, no masses, no thyromegaly.  Trachea midline Respiratory: clear to auscultation bilaterally, no wheezing, no crackles. Normal respiratory effort. No accessory muscle use.  Cardiovascular: Regular rate and rhythm, no murmurs / rubs / gallops. No extremity edema. 2+ pedal pulses.   Abdomen: Soft, no tenderness, no rebound or guarding.  Morbidly obese.  Large ventral hernia palpated, easily reducible.. Bowel sounds normoactive Musculoskeletal: FROM.  Patient unable to lift her legs off the bed. no clubbing / cyanosis. No joint deformity  upper and lower extremities.  Normal muscle tone.  Skin: Warm, dry, intact no rashes, lesions, ulcers. No induration.  Healing incision of lumbar spine without surrounding erythema or drainage. Neurologic: CN 2-12 grossly intact.  Normal speech.  Decreased sensation in lower extremities right more than left to light touch. Grip strength 5/5 bilaterally. Right lower extremity strength 2 out of 5, left lower extremity strength 3 out of 5. Psychiatric: Normal judgment and insight.  Normal mood.    Labs on Admission: I have personally reviewed following labs and imaging studies  CBC: Recent Labs  Lab 07/13/19 1023  WBC 12.8*  NEUTROABS 9.1*  HGB 11.7*  HCT 35.2*  MCV 90.5  PLT 280    Basic Metabolic Panel: Recent Labs  Lab 07/13/19 1023  NA 133*  K 4.8  CL 99  CO2 25  GLUCOSE 133*  BUN 14  CREATININE 0.72  CALCIUM 8.6*    GFR: Estimated Creatinine Clearance: 93.3 mL/min (by C-G formula based on SCr of 0.72 mg/dL).  Liver Function Tests: Recent Labs  Lab 07/13/19 1023  AST 18  ALT 8  ALKPHOS 140*  BILITOT 0.8  PROT 7.2  ALBUMIN 3.3*    Urine analysis:    Component Value Date/Time   COLORURINE Yellow 12/27/2013 1240   APPEARANCEUR Turbid 12/27/2013 1240   LABSPEC 1.026 12/27/2013 1240   PHURINE 5.0 12/27/2013 1240   GLUCOSEU Negative 12/27/2013 1240   HGBUR 2+ 12/27/2013 1240   BILIRUBINUR Negative 12/27/2013 1240   KETONESUR Negative 12/27/2013 1240   PROTEINUR 30 mg/dL 12/27/2013 1240   NITRITE Negative 12/27/2013 1240   LEUKOCYTESUR Negative 12/27/2013 1240    Radiological Exams on Admission: MR THORACIC SPINE WO CONTRAST  Result Date: 07/13/2019 CLINICAL DATA:  Back pain.  History of recent lumbar fusion. EXAM: MRI THORACIC SPINE WITHOUT CONTRAST TECHNIQUE: Multiplanar, multisequence MR imaging of the thoracic spine was performed. No intravenous contrast was administered. COMPARISON:  None. FINDINGS: Alignment:  Normal Vertebrae: No bone lesions or  fractures. No findings suspicious for discitis or osteomyelitis. Cord: Grossly normal cord signal intensity. No cord lesions or syrinx. Paraspinal and other soft tissues: No significant paraspinal or retroperitoneal findings. Disc levels: Small scattered thoracic disc protrusions but no significant disc protrusions, spinal or foraminal stenosis. IMPRESSION: 1. No significant thoracic disc protrusions, spinal or foraminal stenosis. 2. No findings suspicious for discitis or osteomyelitis. 3. Normal appearance of the thoracic  spinal cord. Electronically Signed   By: Marijo Sanes M.D.   On: 07/13/2019 14:02   MR LUMBAR SPINE WO CONTRAST  Result Date: 07/13/2019 CLINICAL DATA:  History of lumbar fusion approximately 3 weeks ago. Patient with back pain. EXAM: MRI LUMBAR SPINE WITHOUT CONTRAST TECHNIQUE: Multiplanar, multisequence MR imaging of the lumbar spine was performed. No intravenous contrast was administered. COMPARISON:  Lumbar spine MRI 06/03/2019 FINDINGS: Segmentation: There are five lumbar type vertebral bodies. The last full intervertebral disc space is labeled L5-S1. L2-L5 fusion hardware noted. Alignment:  Normal overall alignment. Vertebrae: Significant artifact from the hardware but no obvious acute bony findings. Conus medullaris and cauda equina: Conus extends to the L1-2 level. Conus and cauda equina appear normal. Paraspinal and other soft tissues: There is a large complex fluid collection in the laminectomy bed from L2-L5. It measures approximately 6.6 x 4.0 x 4.1 cm. No significant increased T1 signal intensity to suggest this is a hematoma. There is moderate mass effect on the posterior aspect of the thecal sac and findings worrisome for abscess or CSF leak. There is also a large complex fluid collection in the subcutaneous fat measuring 13.5 cm in length. Disc levels: I do not see any findings suspicious for discitis or osteomyelitis without contrast. There is fairly significant mass effect  on the thecal sac from L2-3 down to L4-5. IMPRESSION: 1. Postoperative changes with L2-L5 fusion hardware. 2. Large complex fluid collection in the laminectomy bed from L2-L5. This measures approximately 6.6 x 4.0 x 4.1 cm. There is moderate mass effect on the posterior aspect of the thecal sac and findings worrisome for abscess or CSF leak. Recommend surgical consultation. 3. Large complex subcutaneous fluid collection measuring 13.5 cm in length. 4. No findings suspicious for discitis or osteomyelitis without contrast. Electronically Signed   By: Marijo Sanes M.D.   On: 07/13/2019 14:01    Assessment/Plan #1 weakness right leg Patient is admitted to [place] floor. Secondary to final wound collection around surgical site in the lumbar region. Consult physical therapy.  #2 lumbar surgical wound fluid collection Neurosurgery to evaluate.  Discussed with Dr. Ellene Route of [name] who performed the surgery see the patient in the morning.  We will keep patient n.p.o. after midnight in case neurosurgical intervention will be required.  #3 paresthesia and pain lower extremity Secondary to lumbar spinal fluid collection status post laminectomy Pain control provided.  Patient is given tramadol every 8 hours as needed for moderate pain.  Morphine ordered for severe pain if needed.  #4 spinal stenosis Chronic.  Status post laminectomy of L2-L5  #5 diarrhea With patient having large volume of watery diarrhea since discharge from the hospital nursing home will check for C. difficile and if positive will treat accordingly.  #6 morbid obesity Patient is a follow-up with PCP for dietary lifestyle interventions for weight loss possible referral for bariatric surgery  #7 tobacco use Smoking cessation education to be provided to patient prior to discharge.  She reports she is actively trying to quit smoking and is now smoking less than half a pack of cigarettes a day    DVT prophylaxis: Padua score  elevated.  SCDs for DVT prophylaxis.  Anticoagulation is not provided in case patient will need neurosurgical intervention Code Status:   Full code Family Communication:  Diagnosis plan discussed with patient.  Patient verbalized understanding agrees with plan.  Further recommendations to follow as clinically indicated  Disposition Plan:   Patient is from:  Home  Anticipated  DC to:  Skilled nursing facility for rehabilitation purposes  Anticipated DC date:  Anticipate greater than 2 midnight stay in the hospital.  Anticipated DC barriers: Discharge barrier may be patient's reluctance to go back to a skilled nursing facility for rehab  Consults called:  Neurosurgery, discussed with Dr. Ellene Route. Admission status:  Inpatient  Severity of Illness: The appropriate patient status for this patient is INPATIENT. Inpatient status is judged to be reasonable and necessary in order to provide the required intensity of service to ensure the patient's safety. The patient's presenting symptoms, physical exam findings, and initial radiographic and laboratory data in the context of their chronic comorbidities is felt to place them at high risk for further clinical deterioration. Furthermore, it is not anticipated that the patient will be medically stable for discharge from the hospital within 2 midnights of admission. The following factors support the patient status of inpatient.   " The patient's presenting symptoms include lower extremity weakness, pain and paresthesia.. " The worrisome physical exam findings include weakness of lower extremities.  Inability to turn over in bed, stand up or ambulate. " The initial radiographic and laboratory data are worrisome because of MRI shows a 6 cm x 4 cm x 4 cm fluid collection at lumbar spine at site of laminectomy. " The chronic co-morbidities include spinal stenosis, morbid obesity.   * I certify that at the point of admission it is my clinical judgment that the  patient will require inpatient hospital care spanning beyond 2 midnights from the point of admission due to high intensity of service, high risk for further deterioration and high frequency of surveillance required.Yevonne Aline Hjalmar Ballengee MD [facility]  How to contact the [facility] Attending or Consulting provider [place] or covering provider during after hours [place], for this patient?   1. Check the care team in [facility] and look for a) attending/consulting [facility] provider listed and b) the [facility] team listed 2. Log into [email or web address] and use [place]'s universal password to access. If you do not have the password, please contact the hospital operator. 3. Locate the [facility] provider you are looking for under [facility] and page to a number that you can be directly reached. 4. If you still have difficulty reaching the provider, please page the [facility] (Director on Call) for the Hospitalists listed on amion for assistance.  07/13/2019, 10:07 PM

## 2019-07-13 NOTE — ED Provider Notes (Signed)
[facility] Emergency Department Provider Note  ____________________________________________   First MD Initiated Contact with Patient 07/13/19 [phone]     (approximate)  I have reviewed the triage vital signs and the nursing notes.   HISTORY  Chief Complaint Back Pain    HPI Selena Rodgers is a 69 y.o. female with chronic back pain who is status post back surgery with [facility] and was discharged to SNF.  Patient left SNF AMA and now cannot take care of herself so returns back to the emergency room.  Patient is requesting that I talked to her sister because she is not exactly sure what is going on.  She denies any recurrent falls.  She endorses feeling weak since the surgery.  According to the sister prior to the surgery she had very poor functional status but was able to stand up slightly.  Since the surgery she has been bedbound.  They got her home and she has been in a recliner since then.  No new fall except today when EMS was trying to get her to the hospital her legs buckled but she stated that she did not hit her head did not lose consciousness.  Patient reports chronic back pain secondary to the surgery, constant, nothing better, nothing makes it worse.       Past Medical History:  Diagnosis Date  . Anxiety   . Arthritis   . Chronic back pain   . Spinal stenosis   . Umbilical hernia   . Ventral hernia     Patient Active Problem List   Diagnosis Date Noted  . Spondylolisthesis, lumbar region 06/25/2019  . Adjustment disorder with mixed anxiety and depressed mood   . Slow transit constipation   . Myelopathy ([place]) 04/21/2015  . Cervical myelopathy ([place])   . Constipation due to pain medication   . Depression   . Primary osteoarthritis of right shoulder   . S/P spinal surgery   . Abnormality of gait   . Weakness   . Other secondary hypertension   . Post-operative pain   . Arthritis   . Cervical spondylosis with myelopathy 04/19/2015  .  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 Ventral hernia 10/07/2014  . Current tobacco use 10/06/2014  . Anxiety 09/17/2014  . Back pain, chronic 09/17/2014  . Clinical depression 09/17/2014  . BP (high blood pressure) 09/17/2014  . Borderline diabetes 09/17/2014  . Arthritis, degenerative 09/17/2014  . Spinal stenosis 09/17/2014  . Compulsive tobacco user syndrome 09/17/2014  . Abdominal mass 06/30/2014  . Incisional hernia, without obstruction or gangrene 06/30/2014  . Morbid obesity ([place]) 06/30/2014  . Hypercholesteremia 05/23/2013  . 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 Hypercholesterolemia 05/23/2013    Past Surgical History:  Procedure Laterality Date  . ANTERIOR CERVICAL DECOMP/DISCECTOMY FUSION N/A 04/19/2015   Procedure: ANTERIOR CERVICAL DECOMPRESSION/DISCECTOMY FUSION INTERBODY PROTHESIS PLATING BONEGRAFT CERVICAL THREE-FOUR ,CERVICAL FOUR-FIVE;  Surgeon: Newman Pies, MD;  Location: [facility];  Service: Neurosurgery;  Laterality: N/A;  . BREAST BIOPSY    . CARPAL TUNNEL RELEASE    . CHOLECYSTECTOMY    . FOOT SURGERY Right   . IRRIGATION AND DEBRIDEMENT ABDOMEN  01/10/2014   Dr. Marina Gravel  . UMBILICAL HERNIA REPAIR    . VENTRAL HERNIA REPAIR  12/26/2013   Dr. Pat Patrick    Prior to Admission medications   Medication Sig Start Date End Date Taking? Authorizing Provider  acetaminophen (TYLENOL) 650 MG CR tablet Take 1,300 mg by mouth in the morning and  at bedtime.    [provider]  citalopram (CELEXA) 40 MG tablet Take 1 tablet (40 mg total) by mouth daily. Patient taking differently: Take 20 mg by mouth daily.  04/28/15   Angiulli, Lavon Paganini, PA-C  cyclobenzaprine (FLEXERIL) 10 MG tablet Take 1 tablet (10 mg total) by mouth 3 (three) times daily as needed for muscle spasms. 07/04/19   Viona Gilmore D, NP  docusate sodium (COLACE) 100 MG capsule Take 1 capsule (100 mg total) by mouth 2 (two) times daily. 07/04/19   Viona Gilmore D, NP  meloxicam (MOBIC) 15 MG tablet  Take 15 mg by mouth daily. 03/28/19   [provider]  omeprazole (PRILOSEC) 40 MG capsule Take 40 mg by mouth daily.     [provider]  oxyCODONE (OXY IR/ROXICODONE) 5 MG immediate release tablet Take 1-2 tablets (5-10 mg total) by mouth every 4 (four) hours as needed for moderate pain ((score 4 to 6)). 07/04/19   Viona Gilmore D, NP  sodium chloride (OCEAN) 0.65 % SOLN nasal spray Place 1 spray into both nostrils daily as needed for congestion (dry nose).    [provider]  vitamin B-12 (CYANOCOBALAMIN) 1000 MCG tablet Take 1,000 mcg by mouth daily.    [provider]    Allergies Chlorpheniramine; Linseed oil; Codeine; Flax seed oil [bio-flax]; Latex; Tape; Tapentadol; 2,4-d dimethylamine (amisol); and Aspirin  Family History  Problem Relation Age of Onset  . Diabetes Maternal Grandmother   . Diabetes Maternal Grandfather   . Diabetes Paternal Grandmother   . Diabetes Paternal Grandfather   . Hypertension Mother     Social History Social History   Tobacco Use  . Smoking status: Current Some Day Smoker  . Smokeless tobacco: Never Used  . Tobacco comment: 5 cigs daily  Vaping Use  . Vaping Use: Never used  Substance Use Topics  . Alcohol use: No    Alcohol/week: 0.0 standard drinks  . Drug use: No      Review of Systems Constitutional: No fever/chills Eyes: No visual changes. ENT: No sore throat. Cardiovascular: Denies chest pain. Respiratory: Denies shortness of breath. Gastrointestinal: No abdominal pain.  No nausea, no vomiting.  No diarrhea.  No constipation. Genitourinary: Negative for dysuria. Musculoskeletal: Positive back pain, weakness Skin: Negative for rash. Neurological: Negative for headaches, focal weakness or numbness. All other ROS negative ____________________________________________   PHYSICAL EXAM:  VITAL SIGNS: Blood pressure 133/89, pulse 80, temperature (!) 96.8 F (36 C), temperature source Oral,  resp. rate 16, height 5\' 7"  (1.702 m), weight 121 kg, SpO2 96 %.  Constitutional: Alert and oriented.  Obese female in no acute distress Eyes: Conjunctivae are normal. EOMI. Head: Atraumatic. Nose: No congestion/rhinnorhea. Mouth/Throat: Mucous membranes are moist.   Neck: No stridor. Trachea Midline. FROM Cardiovascular: Normal rate, regular rhythm. Grossly normal heart sounds.  Good peripheral circulation. Respiratory: Normal respiratory effort.  No retractions. Lungs CTAB. Gastrointestinal: Soft and nontender. No distention. No abdominal bruits.  Musculoskeletal: Unable to lift either leg up off the bed.  Sensation intact.  No joint effusions. Neurologic:  Normal speech and language. No gross focal neurologic deficits are appreciated.  Skin:  Skin is warm, dry and intact. No rash noted. Psychiatric: Mood and affect are normal. Speech and behavior are normal. GU: Deferred  Back: Well-healing surgical incision ____________________________________________   LABS (all labs ordered are listed, but only abnormal results are displayed)  Labs Reviewed  CBC WITH DIFFERENTIAL/PLATELET - Abnormal; Notable for the following components:  Result Value   WBC 12.8 (*)    Hemoglobin 11.7 (*)    HCT 35.2 (*)    Neutro Abs 9.1 (*)    Eosinophils Absolute 1.0 (*)    All other components within normal limits  COMPREHENSIVE METABOLIC PANEL - Abnormal; Notable for the following components:   Sodium 133 (*)    Glucose, Bld 133 (*)    Calcium 8.6 (*)    Albumin 3.3 (*)    Alkaline Phosphatase 140 (*)    All other components within normal limits  SARS CORONAVIRUS 2 BY RT PCR (HOSPITAL ORDER, [place] [facility])  URINALYSIS, COMPLETE (UACMP) WITH MICROSCOPIC   ____________________________________________  RADIOLOGY   Official radiology report(s): MR THORACIC SPINE WO CONTRAST  Result Date: 07/13/2019 CLINICAL DATA:  Back pain.  History of recent lumbar fusion. EXAM:  MRI THORACIC SPINE WITHOUT CONTRAST TECHNIQUE: Multiplanar, multisequence MR imaging of the thoracic spine was performed. No intravenous contrast was administered. COMPARISON:  None. FINDINGS: Alignment:  Normal Vertebrae: No bone lesions or fractures. No findings suspicious for discitis or osteomyelitis. Cord: Grossly normal cord signal intensity. No cord lesions or syrinx. Paraspinal and other soft tissues: No significant paraspinal or retroperitoneal findings. Disc levels: Small scattered thoracic disc protrusions but no significant disc protrusions, spinal or foraminal stenosis. IMPRESSION: 1. No significant thoracic disc protrusions, spinal or foraminal stenosis. 2. No findings suspicious for discitis or osteomyelitis. 3. Normal appearance of the thoracic spinal cord. Electronically Signed   By: Marijo Sanes M.D.   On: 07/13/2019 14:02   MR LUMBAR SPINE WO CONTRAST  Result Date: 07/13/2019 CLINICAL DATA:  History of lumbar fusion approximately 3 weeks ago. Patient with back pain. EXAM: MRI LUMBAR SPINE WITHOUT CONTRAST TECHNIQUE: Multiplanar, multisequence MR imaging of the lumbar spine was performed. No intravenous contrast was administered. COMPARISON:  Lumbar spine MRI 06/03/2019 FINDINGS: Segmentation: There are five lumbar type vertebral bodies. The last full intervertebral disc space is labeled L5-S1. L2-L5 fusion hardware noted. Alignment:  Normal overall alignment. Vertebrae: Significant artifact from the hardware but no obvious acute bony findings. Conus medullaris and cauda equina: Conus extends to the L1-2 level. Conus and cauda equina appear normal. Paraspinal and other soft tissues: There is a large complex fluid collection in the laminectomy bed from L2-L5. It measures approximately 6.6 x 4.0 x 4.1 cm. No significant increased T1 signal intensity to suggest this is a hematoma. There is moderate mass effect on the posterior aspect of the thecal sac and findings worrisome for abscess or CSF  leak. There is also a large complex fluid collection in the subcutaneous fat measuring 13.5 cm in length. Disc levels: I do not see any findings suspicious for discitis or osteomyelitis without contrast. There is fairly significant mass effect on the thecal sac from L2-3 down to L4-5. IMPRESSION: 1. Postoperative changes with L2-L5 fusion hardware. 2. Large complex fluid collection in the laminectomy bed from L2-L5. This measures approximately 6.6 x 4.0 x 4.1 cm. There is moderate mass effect on the posterior aspect of the thecal sac and findings worrisome for abscess or CSF leak. Recommend surgical consultation. 3. Large complex subcutaneous fluid collection measuring 13.5 cm in length. 4. No findings suspicious for discitis or osteomyelitis without contrast. Electronically Signed   By: Marijo Sanes M.D.   On: 07/13/2019 14:01    ____________________________________________   PROCEDURES  Procedure(s) performed (including Critical Care):  Procedures   ____________________________________________   INITIAL IMPRESSION / ASSESSMENT AND PLAN / ED COURSE  BETZAIDA CREMEENS was evaluated in Emergency Department on 07/13/2019 for the symptoms described in the history of present illness. She was evaluated in the context of the global COVID-19 pandemic, which necessitated consideration that the patient might be at risk for infection with the SARS-CoV-2 virus that causes COVID-19. Institutional protocols and algorithms that pertain to the evaluation of patients at risk for COVID-19 are in a state of rapid change based on information released by regulatory bodies including the CDC and federal and state organizations. These policies and algorithms were followed during the patient's care in the ED.    Patient is a 69 year old who comes in with concerns for progressive decline secondary to her spinal surgery.  I suspect that most likely this is just deconditioning secondary to the surgery and now being out of  rehab however family's concern that there might be something going on status post the surgery given she has not been able to ambulate has had a decline in mobility since the surgery.  Will get repeat MRI to make sure no evidence of cord compression, discitis, fracture or any other acute pathology.  Suspect that if MRI is negative patient will require PT OT and replacement.  Given patient has been not moved out of a chair for the past few days we will get some basic labs to make sure no electrolyte abnormalities, AKI  Labs are reassuring  IMPRESSION:  1. Postoperative changes with L2-L5 fusion hardware.  2. Large complex fluid collection in the laminectomy bed from L2-L5.  This measures approximately 6.6 x 4.0 x 4.1 cm. There is moderate  mass effect on the posterior aspect of the thecal sac and findings  worrisome for abscess or CSF leak. Recommend surgical consultation.  3. Large complex subcutaneous fluid collection measuring 13.5 cm in  length.  4. No findings suspicious for discitis or osteomyelitis without  contrast.   D/w Dr. Vertell Limber NS who recommends transfer to [facility].   D/w Dr. Doristine Bosworth from hospital team and accepted for transfer  ______________________________________   FINAL CLINICAL IMPRESSION(S) / ED DIAGNOSES   Final diagnoses:  Chronic low back pain without sciatica, unspecified back pain laterality  Postoperative surgical complication involving nervous system associated with nervous system procedure, unspecified complication      MEDICATIONS GIVEN DURING THIS VISIT:  Medications  LORazepam (ATIVAN) injection 1 mg (has no administration in time range)  LORazepam (ATIVAN) injection 1 mg (1 mg Intravenous Given 07/13/19 1306)     ED Discharge Orders    None       Note:  This document was prepared using Dragon voice recognition software and may include unintentional dictation errors.   Vanessa Patagonia, MD 07/13/19 [phone]

## 2019-07-13 NOTE — ED Notes (Signed)
Report called to Judeen Hammans, RN on [place] at [facility] for continuation of care.

## 2019-07-13 NOTE — ED Notes (Signed)
emtala reviewed by this RN, pt verbalized consent to transfer on arrival pt stated her desire to be [facility].

## 2019-07-14 ENCOUNTER — Inpatient Hospital Stay (HOSPITAL_COMMUNITY): Payer: Medicare HMO | Admitting: Critical Care Medicine

## 2019-07-14 ENCOUNTER — Encounter (HOSPITAL_COMMUNITY)
Admission: AD | Disposition: A | Discharge status: Skilled Nursing Facility | Payer: Self-pay | Source: Other Acute Inpatient Hospital | Attending: Internal Medicine

## 2019-07-14 ENCOUNTER — Encounter (HOSPITAL_COMMUNITY): Payer: Self-pay | Admitting: Family Medicine

## 2019-07-14 DIAGNOSIS — M4802 Spinal stenosis, cervical region: Secondary | ICD-10-CM

## 2019-07-14 HISTORY — PX: WOUND EXPLORATION: SHX6188

## 2019-07-14 LAB — HIV ANTIBODY (ROUTINE TESTING W REFLEX): HIV Screen 4th Generation wRfx: NONREACTIVE

## 2019-07-14 LAB — SURGICAL PCR SCREEN
MRSA, PCR: NEGATIVE
Staphylococcus aureus: NEGATIVE

## 2019-07-14 LAB — CBC
HCT: 33.2 % — ABNORMAL LOW (ref 36.0–46.0)
Hemoglobin: 10.3 g/dL — ABNORMAL LOW (ref 12.0–15.0)
MCH: 28.9 pg (ref 26.0–34.0)
MCHC: 31 g/dL (ref 30.0–36.0)
MCV: 93.3 fL (ref 80.0–100.0)
Platelets: 271 10*3/uL (ref 150–400)
RBC: 3.56 MIL/uL — ABNORMAL LOW (ref 3.87–5.11)
RDW: 13 % (ref 11.5–15.5)
WBC: 9.9 10*3/uL (ref 4.0–10.5)
nRBC: 0 % (ref 0.0–0.2)

## 2019-07-14 LAB — BASIC METABOLIC PANEL
Anion gap: 7 (ref 5–15)
BUN: 7 mg/dL — ABNORMAL LOW (ref 8–23)
CO2: 28 mmol/L (ref 22–32)
Calcium: 8.8 mg/dL — ABNORMAL LOW (ref 8.9–10.3)
Chloride: 102 mmol/L (ref 98–111)
Creatinine, Ser: 0.72 mg/dL (ref 0.44–1.00)
GFR calc Af Amer: >60 mL/min (ref >60)
GFR calc non Af Amer: >60 mL/min (ref >60)
Glucose, Bld: 128 mg/dL — ABNORMAL HIGH (ref 70–99)
Potassium: 4.4 mmol/L (ref 3.5–5.1)
Sodium: 137 mmol/L (ref 135–145)

## 2019-07-14 LAB — C DIFFICILE QUICK SCREEN W PCR REFLEX
C Diff antigen: POSITIVE — AB
C Diff toxin: NEGATIVE

## 2019-07-14 LAB — CLOSTRIDIUM DIFFICILE BY PCR, REFLEXED: Toxigenic C. Difficile by PCR: POSITIVE — AB

## 2019-07-14 SURGERY — WOUND EXPLORATION
Anesthesia: General | Site: Spine Lumbar

## 2019-07-14 MED ORDER — HYDROMORPHONE HCL 1 MG/ML IJ SOLN
0.2500 mg | INTRAMUSCULAR | Status: DC | PRN
  Administered 2019-07-14 (×4): 0.5 mg via INTRAVENOUS

## 2019-07-14 MED ORDER — CHLORHEXIDINE GLUCONATE 0.12 % MT SOLN
OROMUCOSAL | Status: AC
  Administered 2019-07-14: 15 mL via OROMUCOSAL
  Filled 2019-07-14: qty 15

## 2019-07-14 MED ORDER — SODIUM CHLORIDE 0.9% FLUSH
3.0000 mL | Freq: Two times a day (BID) | INTRAVENOUS | Status: DC
  Administered 2019-07-14 – 2019-07-23 (×12): 3 mL via INTRAVENOUS

## 2019-07-14 MED ORDER — FENTANYL CITRATE (PF) 250 MCG/5ML IJ SOLN
INTRAMUSCULAR | Status: DC | PRN
  Administered 2019-07-14: 100 ug via INTRAVENOUS
  Administered 2019-07-14: 50 ug via INTRAVENOUS

## 2019-07-14 MED ORDER — BISACODYL 10 MG RE SUPP: 10.0000 mg | Freq: Every day | RECTAL | Status: DC | PRN

## 2019-07-14 MED ORDER — FENTANYL CITRATE (PF) 250 MCG/5ML IJ SOLN
INTRAMUSCULAR | Status: AC
  Filled 2019-07-14: qty 5

## 2019-07-14 MED ORDER — PROMETHAZINE HCL 25 MG/ML IJ SOLN: 6.2500 mg | INTRAMUSCULAR | Status: DC | PRN

## 2019-07-14 MED ORDER — PROPOFOL 10 MG/ML IV BOLUS
INTRAVENOUS | Status: AC
  Filled 2019-07-14: qty 20

## 2019-07-14 MED ORDER — LIDOCAINE 2% (20 MG/ML) 5 ML SYRINGE
INTRAMUSCULAR | Status: DC | PRN
  Administered 2019-07-14: 100 mg via INTRAVENOUS

## 2019-07-14 MED ORDER — PHENOL 1.4 % MT LIQD: 1.0000 | OROMUCOSAL | Status: DC | PRN

## 2019-07-14 MED ORDER — ONDANSETRON HCL 4 MG PO TABS: 4.0000 mg | ORAL_TABLET | Freq: Four times a day (QID) | ORAL | Status: DC | PRN

## 2019-07-14 MED ORDER — SUGAMMADEX SODIUM 200 MG/2ML IV SOLN
INTRAVENOUS | Status: DC | PRN
  Administered 2019-07-14: 500 mg via INTRAVENOUS

## 2019-07-14 MED ORDER — ONDANSETRON HCL 4 MG/2ML IJ SOLN: 4.0000 mg | Freq: Four times a day (QID) | INTRAMUSCULAR | Status: DC | PRN

## 2019-07-14 MED ORDER — LACTATED RINGERS IV SOLN: INTRAVENOUS | Status: DC

## 2019-07-14 MED ORDER — ACETAMINOPHEN 500 MG PO TABS
1000.0000 mg | ORAL_TABLET | Freq: Four times a day (QID) | ORAL | Status: AC
  Administered 2019-07-14 – 2019-07-15 (×3): 1000 mg via ORAL
  Filled 2019-07-14 (×3): qty 2

## 2019-07-14 MED ORDER — MENTHOL 3 MG MT LOZG: 1.0000 | LOZENGE | OROMUCOSAL | Status: DC | PRN

## 2019-07-14 MED ORDER — ROCURONIUM BROMIDE 10 MG/ML (PF) SYRINGE
PREFILLED_SYRINGE | INTRAVENOUS | Status: DC | PRN
  Administered 2019-07-14: 60 mg via INTRAVENOUS

## 2019-07-14 MED ORDER — MORPHINE SULFATE (PF) 4 MG/ML IV SOLN: 4.0000 mg | INTRAVENOUS | Status: DC | PRN

## 2019-07-14 MED ORDER — CHLORHEXIDINE GLUCONATE 0.12 % MT SOLN: 15.0000 mL | Freq: Once | OROMUCOSAL | Status: AC

## 2019-07-14 MED ORDER — ACETAMINOPHEN 325 MG PO TABS
650.0000 mg | ORAL_TABLET | ORAL | Status: DC | PRN
  Administered 2019-07-16: 650 mg via ORAL
  Filled 2019-07-14: qty 2

## 2019-07-14 MED ORDER — CYCLOBENZAPRINE HCL 10 MG PO TABS
10.0000 mg | ORAL_TABLET | Freq: Three times a day (TID) | ORAL | Status: DC | PRN
  Administered 2019-07-14: 10 mg via ORAL
  Filled 2019-07-14 (×2): qty 1

## 2019-07-14 MED ORDER — SODIUM CHLORIDE 0.9 % IV SOLN
INTRAVENOUS | Status: DC | PRN
  Administered 2019-07-14: 500 mL

## 2019-07-14 MED ORDER — 0.9 % SODIUM CHLORIDE (POUR BTL) OPTIME
TOPICAL | Status: DC | PRN
  Administered 2019-07-14: 1000 mL

## 2019-07-14 MED ORDER — DEXTROSE 5 % IV SOLN
INTRAVENOUS | Status: DC | PRN
  Administered 2019-07-14: 3 g via INTRAVENOUS

## 2019-07-14 MED ORDER — SODIUM CHLORIDE 0.9% FLUSH: 3.0000 mL | INTRAVENOUS | Status: DC | PRN

## 2019-07-14 MED ORDER — MEPERIDINE HCL 25 MG/ML IJ SOLN: 6.2500 mg | INTRAMUSCULAR | Status: DC | PRN

## 2019-07-14 MED ORDER — HYDROMORPHONE HCL 1 MG/ML IJ SOLN
INTRAMUSCULAR | Status: AC
  Filled 2019-07-14: qty 1

## 2019-07-14 MED ORDER — ACETAMINOPHEN 650 MG RE SUPP: 650.0000 mg | RECTAL | Status: DC | PRN

## 2019-07-14 MED ORDER — PHENYLEPHRINE HCL (PRESSORS) 10 MG/ML IV SOLN
INTRAVENOUS | Status: DC | PRN
Start: 2019-07-14 — End: 2019-07-14
  Administered 2019-07-14 (×3): 120 ug via INTRAVENOUS

## 2019-07-14 MED ORDER — BACITRACIN ZINC 500 UNIT/GM EX OINT
TOPICAL_OINTMENT | CUTANEOUS | Status: AC
  Filled 2019-07-14: qty 28.35

## 2019-07-14 MED ORDER — OXYCODONE HCL 5 MG PO TABS: 5.0000 mg | ORAL_TABLET | ORAL | Status: DC | PRN

## 2019-07-14 MED ORDER — ONDANSETRON HCL 4 MG/2ML IJ SOLN
INTRAMUSCULAR | Status: DC | PRN
  Administered 2019-07-14: 4 mg via INTRAVENOUS

## 2019-07-14 MED ORDER — THROMBIN 5000 UNITS EX SOLR
CUTANEOUS | Status: AC
  Filled 2019-07-14: qty 5000

## 2019-07-14 MED ORDER — ZOLPIDEM TARTRATE 5 MG PO TABS
5.0000 mg | ORAL_TABLET | Freq: Every evening | ORAL | Status: DC | PRN
  Filled 2019-07-14: qty 1

## 2019-07-14 MED ORDER — CEFAZOLIN SODIUM-DEXTROSE 2-4 GM/100ML-% IV SOLN
2.0000 g | Freq: Three times a day (TID) | INTRAVENOUS | Status: AC
  Administered 2019-07-15 (×2): 2 g via INTRAVENOUS
  Filled 2019-07-14 (×2): qty 100

## 2019-07-14 MED ORDER — BACITRACIN ZINC 500 UNIT/GM EX OINT
TOPICAL_OINTMENT | CUTANEOUS | Status: DC | PRN
  Administered 2019-07-14: 1 via TOPICAL

## 2019-07-14 MED ORDER — DOCUSATE SODIUM 100 MG PO CAPS
100.0000 mg | ORAL_CAPSULE | Freq: Two times a day (BID) | ORAL | Status: DC
  Administered 2019-07-17 – 2019-07-20 (×4): 100 mg via ORAL
  Filled 2019-07-14 (×11): qty 1

## 2019-07-14 MED ORDER — PROPOFOL 10 MG/ML IV BOLUS
INTRAVENOUS | Status: DC | PRN
  Administered 2019-07-14: 150 mg via INTRAVENOUS

## 2019-07-14 MED ORDER — MIDAZOLAM HCL 2 MG/2ML IJ SOLN
INTRAMUSCULAR | Status: AC
  Filled 2019-07-14: qty 2

## 2019-07-14 MED ORDER — VANCOMYCIN HCL 1000 MG IV SOLR
INTRAVENOUS | Status: AC
  Filled 2019-07-14: qty 1000

## 2019-07-14 MED ORDER — OXYCODONE HCL 5 MG PO TABS
10.0000 mg | ORAL_TABLET | ORAL | Status: DC | PRN
  Administered 2019-07-14 – 2019-07-15 (×3): 10 mg via ORAL
  Filled 2019-07-14 (×3): qty 2

## 2019-07-14 MED ORDER — LIDOCAINE-EPINEPHRINE 1 %-1:100000 IJ SOLN
INTRAMUSCULAR | Status: AC
  Filled 2019-07-14: qty 1

## 2019-07-14 MED ORDER — PHENYLEPHRINE HCL-NACL 10-0.9 MG/250ML-% IV SOLN
INTRAVENOUS | Status: DC | PRN
  Administered 2019-07-14: 40 ug/min via INTRAVENOUS

## 2019-07-14 MED ORDER — BUPIVACAINE-EPINEPHRINE 0.5% -1:200000 IJ SOLN
INTRAMUSCULAR | Status: AC
  Filled 2019-07-14: qty 1

## 2019-07-14 MED ORDER — SODIUM CHLORIDE 0.9 % IV SOLN: 250.0000 mL | INTRAVENOUS | Status: DC

## 2019-07-14 SURGICAL SUPPLY — 53 items
BAG DECANTER FOR FLEXI CONT (MISCELLANEOUS) ×2 IMPLANT
BENZOIN TINCTURE PRP APPL 2/3 (GAUZE/BANDAGES/DRESSINGS) ×2 IMPLANT
BLADE CLIPPER SURG (BLADE) IMPLANT
CANISTER SUCT 3000ML PPV (MISCELLANEOUS) ×2 IMPLANT
CARTRIDGE OIL MAESTRO DRILL (MISCELLANEOUS) IMPLANT
CLSR STERI-STRIP ANTIMIC 1/2X4 (GAUZE/BANDAGES/DRESSINGS) ×2 IMPLANT
COVER WAND RF STERILE (DRAPES) ×2 IMPLANT
DIFFUSER DRILL AIR PNEUMATIC (MISCELLANEOUS) ×2 IMPLANT
DRAPE LAPAROTOMY 100X72 PEDS (DRAPES) IMPLANT
DRAPE LAPAROTOMY 100X72X124 (DRAPES) IMPLANT
DRAPE SURG 17X23 STRL (DRAPES) ×8 IMPLANT
DRSG OPSITE POSTOP 4X8 (GAUZE/BANDAGES/DRESSINGS) ×2 IMPLANT
ELECT REM PT RETURN 9FT ADLT (ELECTROSURGICAL) ×2
ELECTRODE REM PT RTRN 9FT ADLT (ELECTROSURGICAL) ×1 IMPLANT
EVACUATOR 3/16  PVC DRAIN (DRAIN) ×1
EVACUATOR 3/16 PVC DRAIN (DRAIN) ×1 IMPLANT
GAUZE 4X4 16PLY RFD (DISPOSABLE) IMPLANT
GAUZE SPONGE 4X4 12PLY STRL (GAUZE/BANDAGES/DRESSINGS) ×2 IMPLANT
GLOVE BIO SURGEON STRL SZ 6.5 (GLOVE) IMPLANT
GLOVE BIO SURGEON STRL SZ8 (GLOVE) ×2 IMPLANT
GLOVE BIO SURGEON STRL SZ8.5 (GLOVE) IMPLANT
GLOVE BIOGEL PI IND STRL 6.5 (GLOVE) ×1 IMPLANT
GLOVE BIOGEL PI IND STRL 7.5 (GLOVE) ×1 IMPLANT
GLOVE BIOGEL PI IND STRL 8.5 (GLOVE) ×1 IMPLANT
GLOVE BIOGEL PI INDICATOR 6.5 (GLOVE) ×1
GLOVE BIOGEL PI INDICATOR 7.5 (GLOVE) ×1
GLOVE BIOGEL PI INDICATOR 8.5 (GLOVE) ×1
GLOVE EXAM NITRILE XL STR (GLOVE) IMPLANT
GLOVE SURG SS PI 7.5 STRL IVOR (GLOVE) ×2 IMPLANT
GLOVE SURG SS PI 8.0 STRL IVOR (GLOVE) ×2 IMPLANT
GLOVE SURG SS PI 8.5 STRL IVOR (GLOVE) ×1
GLOVE SURG SS PI 8.5 STRL STRW (GLOVE) ×1 IMPLANT
GOWN STRL REUS W/ TWL LRG LVL3 (GOWN DISPOSABLE) IMPLANT
GOWN STRL REUS W/ TWL XL LVL3 (GOWN DISPOSABLE) IMPLANT
GOWN STRL REUS W/TWL LRG LVL3 (GOWN DISPOSABLE)
GOWN STRL REUS W/TWL XL LVL3 (GOWN DISPOSABLE)
KIT BASIN OR (CUSTOM PROCEDURE TRAY) ×2 IMPLANT
KIT TURNOVER KIT B (KITS) ×2 IMPLANT
NEEDLE HYPO 22GX1.5 SAFETY (NEEDLE) IMPLANT
NS IRRIG 1000ML POUR BTL (IV SOLUTION) ×2 IMPLANT
OIL CARTRIDGE MAESTRO DRILL (MISCELLANEOUS)
PACK LAMINECTOMY NEURO (CUSTOM PROCEDURE TRAY) ×2 IMPLANT
PAD ARMBOARD 7.5X6 YLW CONV (MISCELLANEOUS) ×6 IMPLANT
STRIP CLOSURE SKIN 1/2X4 (GAUZE/BANDAGES/DRESSINGS) ×2 IMPLANT
SUT ETHILON 2 0 PSLX (SUTURE) ×4 IMPLANT
SUT VIC AB 1 CT1 18XBRD ANBCTR (SUTURE) ×1 IMPLANT
SUT VIC AB 1 CT1 8-18 (SUTURE) ×1
SUT VIC AB 2-0 CP2 18 (SUTURE) ×2 IMPLANT
SWAB COLLECTION DEVICE MRSA (MISCELLANEOUS) ×2 IMPLANT
SWAB CULTURE ESWAB REG 1ML (MISCELLANEOUS) ×2 IMPLANT
TOWEL GREEN STERILE (TOWEL DISPOSABLE) ×2 IMPLANT
TOWEL GREEN STERILE FF (TOWEL DISPOSABLE) ×2 IMPLANT
WATER STERILE IRR 1000ML POUR (IV SOLUTION) ×2 IMPLANT

## 2019-07-14 NOTE — Consult Note (Signed)
Reason for Consult: Epidural mass Referring Physician: Dr. Chong Sicilian Selena Rodgers is an 69 y.o. female.  HPI: The patient is a 69 year old morbidly obese white female on whom I performed a lumbar decompression, instrumentation and fusion on 06/25/2019.  The patient had a prolonged hospital course.  She was slow to mobilize.  We had PT and OT see her.  She was eventually transferred to [facility].  She did not have a good experience there and signed out AMA.  She tells me she went home and seemed to doing fairly well until a few days ago when she began having progressive weakness in her legs.  She went to the [place] [facility] yesterday and was worked up with a lumbar MRI which demonstrated an epidural mass and she was transferred to [facility].  A neurosurgical consultation was requested.  Presently the patient is alert and pleasant.  She feels her legs have become weaker over the last few days.  She denies headaches, drainage from her wound, etc.  Past Medical History:  Diagnosis Date  . Anxiety   . Arthritis   . Chronic back pain   . Depression   . GERD (gastroesophageal reflux disease)   . Hypertension   . Spinal stenosis   . Umbilical hernia   . Ventral hernia     Past Surgical History:  Procedure Laterality Date  . ANTERIOR CERVICAL DECOMP/DISCECTOMY FUSION N/A 04/19/2015   Procedure: ANTERIOR CERVICAL DECOMPRESSION/DISCECTOMY FUSION INTERBODY PROTHESIS PLATING BONEGRAFT CERVICAL THREE-FOUR ,CERVICAL FOUR-FIVE;  Surgeon: Newman Pies, MD;  Location: [facility];  Service: Neurosurgery;  Laterality: N/A;  . BREAST BIOPSY    . CARPAL TUNNEL RELEASE    . CHOLECYSTECTOMY    . FOOT SURGERY Right   . IRRIGATION AND DEBRIDEMENT ABDOMEN  01/10/2014   Dr. Marina Gravel  . UMBILICAL HERNIA REPAIR    . VENTRAL HERNIA REPAIR  12/26/2013   Dr. Pat Patrick    Family History  Problem Relation Age of Onset  . Diabetes Maternal Grandmother   . Diabetes Maternal Grandfather   . Diabetes  Paternal Grandmother   . Diabetes Paternal Grandfather   . Hypertension Mother     Social History:  reports that she has been smoking. She has never used smokeless tobacco. She reports that she does not drink alcohol and does not use drugs.  Allergies:  Allergies  Allergen Reactions  . Chlorpheniramine Anaphylaxis  . Linseed Oil Anaphylaxis  . Codeine Hives  . Flax Seed Oil [Bio-Flax] Swelling    UNSPECIFIED   . Latex Hives and Other (See Comments)    BLISTERS  . Tape Other (See Comments)    BLISTERS  . Tapentadol Other (See Comments)    BLISTERS  . 2,4-D Dimethylamine (Amisol) Rash    T-DAP  . Aspirin Rash    Medications:  I have reviewed the patient's current medications. Prior to Admission:  Medications Prior to Admission  Medication Sig Dispense Refill Last Dose  . acetaminophen (TYLENOL) 650 MG CR tablet Take 1,300 mg by mouth in the morning and at bedtime.   07/13/2019 at Unknown time  . citalopram (CELEXA) 40 MG tablet Take 1 tablet (40 mg total) by mouth daily. (Patient taking differently: Take 20 mg by mouth daily. ) 30 tablet 1 07/13/2019 at Unknown time  . docusate sodium (COLACE) 100 MG capsule Take 1 capsule (100 mg total) by mouth 2 (two) times daily. (Patient taking differently: Take 100 mg by mouth 2 (two) times daily as  needed for mild constipation. ) 10 capsule 0 Past Month at Unknown time  . meloxicam (MOBIC) 15 MG tablet Take 15 mg by mouth daily.   07/13/2019 at Unknown time  . omeprazole (PRILOSEC) 40 MG capsule Take 40 mg by mouth daily.    07/13/2019 at Unknown time  . sodium chloride (OCEAN) 0.65 % SOLN nasal spray Place 1 spray into both nostrils daily as needed for congestion (dry nose).   Past Month at Unknown time  . traMADol (ULTRAM) 50 MG tablet Take 50-100 mg by mouth 4 (four) times daily as needed for pain.   07/13/2019 at Unknown time  . vitamin B-12 (CYANOCOBALAMIN) 1000 MCG tablet Take 1,000 mcg by mouth daily.   07/13/2019 at Unknown time  .  cyclobenzaprine (FLEXERIL) 10 MG tablet Take 1 tablet (10 mg total) by mouth 3 (three) times daily as needed for muscle spasms. (Patient not taking: Reported on 07/14/2019) 30 tablet 0 Not Taking at Unknown time   Scheduled: . [MAR Hold] acetaminophen  650 mg Oral Q8H  . [MAR Hold] citalopram  20 mg Oral Daily  . [MAR Hold] meloxicam  15 mg Oral Daily  . [MAR Hold] pantoprazole  40 mg Oral Daily  . [MAR Hold] traMADol  100 mg Oral Q8H   Continuous: . sodium chloride 125 mL/hr at 07/13/19 2304  . lactated ringers 10 mL/hr at 07/14/19 1300   PRN:[MAR Hold] albuterol, [MAR Hold]  morphine injection, [MAR Hold] promethazine Anti-infectives (From admission, onward)   None       Results for orders placed or performed during the hospital encounter of 07/13/19 (from the past 48 hour(s))  Surgical pcr screen     Status: None   Collection Time: 07/13/19 10:41 PM   Specimen: Nasal Mucosa; Nasal Swab  Result Value Ref Range   MRSA, PCR NEGATIVE NEGATIVE   Staphylococcus aureus NEGATIVE NEGATIVE    Comment: (NOTE) The Xpert SA Assay (FDA approved for NASAL specimens in patients 60 years of age and older), is one component of a comprehensive surveillance program. It is not intended to diagnose infection nor to guide or monitor treatment. Performed at [facility], [place] [address] [place], [place] [address]   C Difficile Quick Screen w PCR reflex     Status: Abnormal   Collection Time: 07/14/19 12:46 AM   Specimen: STOOL  Result Value Ref Range   C Diff antigen POSITIVE (A) NEGATIVE   C Diff toxin NEGATIVE NEGATIVE   C Diff interpretation Results are indeterminate. See PCR results.     Comment: Performed at [facility], [place] [address] [place], [place] [address]  C. Diff by PCR, Reflexed     Status: Abnormal   Collection Time: 07/14/19 12:46 AM  Result Value Ref Range   Toxigenic C. Difficile by PCR POSITIVE (A) NEGATIVE    Comment: Positive for toxigenic C. difficile with  little to no toxin production. Only treat if clinical presentation suggests symptomatic illness. Performed at [facility], [place] [address], [place], [place] [address]   HIV Antibody (routine testing w rflx)     Status: None   Collection Time: 07/14/19  3:19 AM  Result Value Ref Range   HIV Screen 4th Generation wRfx Non Reactive Non Reactive    Comment: Performed at [facility], [place] [address], [place], [place] [address]  Basic metabolic panel     Status: Abnormal   Collection Time: 07/14/19  3:19 AM  Result  Value Ref Range   Sodium 137 135 - 145 mmol/L   Potassium 4.4 3.5 - 5.1 mmol/L   Chloride 102 98 - 111 mmol/L   CO2 28 22 - 32 mmol/L   Glucose, Bld 128 (H) 70 - 99 mg/dL    Comment: Glucose reference range applies only to samples taken after fasting for at least 8 hours.   BUN 7 (L) 8 - 23 mg/dL   Creatinine, Ser 0.72 0.44 - 1.00 mg/dL   Calcium 8.8 (L) 8.9 - 10.3 mg/dL   GFR calc non Af Amer >60 >60 mL/min   GFR calc Af Amer >60 >60 mL/min   Anion gap 7 5 - 15    Comment: Performed at [place] [address] [place], [place] [address]  CBC     Status: Abnormal   Collection Time: 07/14/19  3:19 AM  Result Value Ref Range   WBC 9.9 4.0 - 10.5 K/uL   RBC 3.56 (L) 3.87 - 5.11 MIL/uL   Hemoglobin 10.3 (L) 12.0 - 15.0 g/dL   HCT 33.2 (L) 36 - 46 %   MCV 93.3 80.0 - 100.0 fL   MCH 28.9 26.0 - 34.0 pg   MCHC 31.0 30.0 - 36.0 g/dL   RDW 13.0 11.5 - 15.5 %   Platelets 271 150 - 400 K/uL   nRBC 0.0 0.0 - 0.2 %    Comment: Performed at [facility], [place] [address], [place],  [address]    MR THORACIC SPINE WO CONTRAST  Result Date: 07/13/2019 CLINICAL DATA:  Back pain.  History of recent lumbar fusion. EXAM: MRI THORACIC SPINE WITHOUT CONTRAST TECHNIQUE: Multiplanar, multisequence MR imaging of the thoracic spine was performed. No intravenous contrast was administered. COMPARISON:  None. FINDINGS: Alignment:  Normal Vertebrae: No bone lesions  or fractures. No findings suspicious for discitis or osteomyelitis. Cord: Grossly normal cord signal intensity. No cord lesions or syrinx. Paraspinal and other soft tissues: No significant paraspinal or retroperitoneal findings. Disc levels: Small scattered thoracic disc protrusions but no significant disc protrusions, spinal or foraminal stenosis. IMPRESSION: 1. No significant thoracic disc protrusions, spinal or foraminal stenosis. 2. No findings suspicious for discitis or osteomyelitis. 3. Normal appearance of the thoracic spinal cord. Electronically Signed   By: Marijo Sanes M.D.   On: 07/13/2019 14:02   MR LUMBAR SPINE WO CONTRAST  Result Date: 07/13/2019 CLINICAL DATA:  History of lumbar fusion approximately 3 weeks ago. Patient with back pain. EXAM: MRI LUMBAR SPINE WITHOUT CONTRAST TECHNIQUE: Multiplanar, multisequence MR imaging of the lumbar spine was performed. No intravenous contrast was administered. COMPARISON:  Lumbar spine MRI 06/03/2019 FINDINGS: Segmentation: There are five lumbar type vertebral bodies. The last full intervertebral disc space is labeled L5-S1. L2-L5 fusion hardware noted. Alignment:  Normal overall alignment. Vertebrae: Significant artifact from the hardware but no obvious acute bony findings. Conus medullaris and cauda equina: Conus extends to the L1-2 level. Conus and cauda equina appear normal. Paraspinal and other soft tissues: There is a large complex fluid collection in the laminectomy bed from L2-L5. It measures approximately 6.6 x 4.0 x 4.1 cm. No significant increased T1 signal intensity to suggest this is a hematoma. There is moderate mass effect on the posterior aspect of the thecal sac and findings worrisome for abscess or CSF leak. There is also a large complex fluid collection in the subcutaneous fat measuring 13.5 cm in length. Disc levels: I do not see any findings suspicious for discitis or  osteomyelitis without contrast. There is fairly significant mass  effect on the thecal sac from L2-3 down to L4-5. IMPRESSION: 1. Postoperative changes with L2-L5 fusion hardware. 2. Large complex fluid collection in the laminectomy bed from L2-L5. This measures approximately 6.6 x 4.0 x 4.1 cm. There is moderate mass effect on the posterior aspect of the thecal sac and findings worrisome for abscess or CSF leak. Recommend surgical consultation. 3. Large complex subcutaneous fluid collection measuring 13.5 cm in length. 4. No findings suspicious for discitis or osteomyelitis without contrast. Electronically Signed   By: Marijo Sanes M.D.   On: 07/13/2019 14:01    ROS Blood pressure (!) 158/67, pulse 78, temperature 98.1 F (36.7 C), temperature source Oral, resp. rate 18, height 5' 7.01" (1.702 m), weight 121 kg, SpO2 93 %. Estimated body mass index is 41.77 kg/m as calculated from the following:   Height as of this encounter: 5' 7.01" (1.702 m).   Weight as of this encounter: 121 kg.  Physical Exam  General: An obese pleasant 69 year old white female in no apparent distress  HEENT: Unremarkable  Thorax: Symmetric  Abdomen: Obese with a large ventral hernia  Neurologic exam: The patient is alert and oriented.  Her lower extremity strength is approximately 4+/5 in her bile gastrocnemius and dorsiflexors.  She has approximately 3-4 5 left quadricep strength and 2/4 right quadricep strength.  Back exam: The patient's lumbar incision is healing well without signs of infection or drainage.  I have reviewed the patient's lumbar MRI: It demonstrates an epidural mass/fluid with compression of the thecal sac.  Assessment/Plan: Epidural mass/fluid, paraparesis: I have discussed the situation with the patient.  We have discussed the possibilities including infection/abscess, spinal fluid leak, epidural hematoma, seroma, etc.  We have discussed the various treatment options including observation versus exploration of her lumbar wound and drainage of the fluid.  I  recommended the surgery.  I described the surgery to her.  We have discussed the risk surgery including risks of anesthesia, infection, spinal fluid leak, etc.  We have discussed the risks, benefits, alternatives, expected postoperative course, and likelihood of achieving our goals with surgery.  I have answered all her questions.  She has decided to proceed with surgery.  Ophelia Charter 07/14/2019, 3:58 PM

## 2019-07-14 NOTE — Progress Notes (Signed)
I reviewed the patient's lumbar MRI.  She has a epidural fluid collection with compression of the thecal sac.  I will plan an exploration of her wound with evacuation of a presumed hematoma when OR becomes available this afternoon.

## 2019-07-14 NOTE — Progress Notes (Signed)
PROGRESS NOTE    Selena Rodgers   ELF:[id number]  DOB: [birth year]  DOA: 07/13/2019 PCP: [name], MD   Brief Narrative:  Selena Rodgers is a 68 y.o. female with medical history significant for chronic low back pain, spinal stenosis, large ventral hernia, depression/anxiety, GERD and arthritis.  She underwent laminectomy by [name] on June 23 of this year secondary to lumbar spondylolithiasis and severe spinal stenosis.  She was discharged to a SNF left there AMA after a few days.  She has been at home for the last 9 days.  She reports that since getting home she has been sitting on recliner or bed bound and has not had difficulty ambulating and caring for self.  Her sister was staying with her to help her.  She reports that for the first few days she gets that the recliner and extend and flex her legs to help exercise them for the last 3 days she has not been able to move her right leg.  She is also had tingling and numbness in her right leg from her hip to her ankle. She went to the emergency room at [facility] this morning secondary to weakness and pain in her right leg.  MRI lumbar spine shows postoperative changes with L2-L5 fusion hardware.  Large complex fluid collection in the laminectomy bed from L2-L5 this measures approximately 6.6 x 4.0 x 4.1 cm.  Moderate mass-effect on the posterior aspect of thecal sac and findings worrisome for abscess or CSF leak.  Case was discussed with neurosurgery here at [facility] who recommended patient be transferred to our facility for neurosurgical evaluation.  MRI lumbar spine shows postoperative changes with L2-L5 fusion hardware.  Large complex fluid collection in the laminectomy bed from L2-L5 this measures approximately 6.6 x 4.0 x 4.1 cm.  Moderate mass-effect on the posterior aspect of thecal sac and findings worrisome for abscess or CSF leak.  Case was discussed with neurosurgery here at [facility] who recommended  patient be transferred to our facility for neurosurgical evaluation. '   She reports she has been having loose watery diarrhea for the last 3 to 4 days.  She reports she has had more than 12 episodes of watery diarrhea a day for the last 2 days.   Subjective: Ongoing watery stools.  Assessment & Plan:   Principal Problem:   Weakness and Paresthesia of right lower extremity S/p laminectomy of L2-3, L3-4, L4-5 - NS plans to take back to OR this afternoon  Active Problems:     Diarrhea - C diff antigen and PCR positive, normal WBC count today, no fever, no tenderness on exam- follow for now   Morbid obesity  - needs to lose weight  Large ventral hernia - non tender       Time spent in minutes: 35 DVT prophylaxis: SCDs Start: 07/13/19 2228  Code Status: Full code Family Communication:  Disposition Plan:  Status is: Inpatient  Remains inpatient appropriate because:needs lumbar surgery and likely has c diff.   Dispo: The patient is from: Home              Anticipated d/c is to: TBD              Anticipated d/c date is: > 3 days              Patient currently is not medically stable to d/c.      Consultants:   NS Procedures:  Antimicrobials:  Anti-infectives (From admission, onward)   None       Objective: Vitals:   07/13/19 1920 07/14/19 0406 07/14/19 0820  BP: (!) 139/91 (!) 141/53 (!) 158/67  Pulse: 71 75 78  Resp: 19 17 18   Temp: 98.7 F (37.1 C) 98.3 F (36.8 C) 98.1 F (36.7 C)  TempSrc: Oral Oral Oral  SpO2: 98% 93% 93%    Intake/Output Summary (Last 24 hours) at 07/14/2019 1224 Last data filed at 07/14/2019 0900 Gross per 24 hour  Intake 741.67 ml  Output 300 ml  Net 441.67 ml   There were no vitals filed for this visit.  Examination: General exam: Appears comfortable  HEENT: PERRLA, oral mucosa moist, no sclera icterus or thrush Respiratory system: Clear to auscultation. Respiratory effort normal. Cardiovascular system: S1 &  S2 heard, RRR.   Gastrointestinal system: Abdomen soft, non-tender, nondistended. Normal bowel sounds. Central nervous system: Alert and oriented. Weakness in left leg noted. Extremities: No cyanosis, clubbing or edema Skin: No rashes or ulcers Psychiatry:  Mood & affect appropriate.     Data Reviewed: I have personally reviewed following labs and imaging studies  CBC: Recent Labs  Lab 07/13/19 1023 07/14/19 0319  WBC 12.8* 9.9  NEUTROABS 9.1*  --   HGB 11.7* 10.3*  HCT 35.2* 33.2*  MCV 90.5 93.3  PLT 307 673   Basic Metabolic Panel: Recent Labs  Lab 07/13/19 1023 07/14/19 0319  NA 133* 137  K 4.8 4.4  CL 99 102  CO2 25 28  GLUCOSE 133* 128*  BUN 14 7*  CREATININE 0.72 0.72  CALCIUM 8.6* 8.8*   GFR: Estimated Creatinine Clearance: 93.3 mL/min (by C-G formula based on SCr of 0.72 mg/dL). Liver Function Tests: Recent Labs  Lab 07/13/19 1023  AST 18  ALT 8  ALKPHOS 140*  BILITOT 0.8  PROT 7.2  ALBUMIN 3.3*   No results for input(s): LIPASE, AMYLASE in the last 168 hours. No results for input(s): AMMONIA in the last 168 hours. Coagulation Profile: No results for input(s): INR, PROTIME in the last 168 hours. Cardiac Enzymes: No results for input(s): CKTOTAL, CKMB, CKMBINDEX, TROPONINI in the last 168 hours. BNP (last 3 results) No results for input(s): PROBNP in the last 8760 hours. HbA1C: No results for input(s): HGBA1C in the last 72 hours. CBG: No results for input(s): GLUCAP in the last 168 hours. Lipid Profile: No results for input(s): CHOL, HDL, LDLCALC, TRIG, CHOLHDL, LDLDIRECT in the last 72 hours. Thyroid Function Tests: No results for input(s): TSH, T4TOTAL, FREET4, T3FREE, THYROIDAB in the last 72 hours. Anemia Panel: No results for input(s): VITAMINB12, FOLATE, FERRITIN, TIBC, IRON, RETICCTPCT in the last 72 hours. Urine analysis:    Component Value Date/Time   COLORURINE Yellow 12/27/2013 1240   APPEARANCEUR Turbid 12/27/2013 1240    LABSPEC 1.026 12/27/2013 1240   PHURINE 5.0 12/27/2013 1240   GLUCOSEU Negative 12/27/2013 1240   HGBUR 2+ 12/27/2013 1240   BILIRUBINUR Negative 12/27/2013 1240   KETONESUR Negative 12/27/2013 1240   PROTEINUR 30 mg/dL 12/27/2013 1240   NITRITE Negative 12/27/2013 1240   LEUKOCYTESUR Negative 12/27/2013 1240   Sepsis Labs: @LABRCNTIP (procalcitonin:4,lacticidven:4) ) Recent Results (from the past 240 hour(s))  SARS Coronavirus 2 by RT PCR (hospital order, performed in [place] hospital lab) Nasopharyngeal Nasopharyngeal Swab     Status: None   Collection Time: 07/13/19 10:23 AM   Specimen: Nasopharyngeal Swab  Result Value Ref Range Status   SARS Coronavirus 2 NEGATIVE NEGATIVE Final  Comment: (NOTE) SARS-CoV-2 target nucleic acids are NOT DETECTED.  The SARS-CoV-2 RNA is generally detectable in upper and lower respiratory specimens during the acute phase of infection. The lowest concentration of SARS-CoV-2 viral copies this assay can detect is 250 copies / mL. A negative result does not preclude SARS-CoV-2 infection and should not be used as the sole basis for treatment or other patient management decisions.  A negative result may occur with improper specimen collection / handling, submission of specimen other than nasopharyngeal swab, presence of viral mutation(s) within the areas targeted by this assay, and inadequate number of viral copies (<250 copies / mL). A negative result must be combined with clinical observations, patient history, and epidemiological information.  Fact Sheet for Patients:   StrictlyIdeas.no  Fact Sheet for Healthcare Providers: BankingDealers.co.za  This test is not yet approved or  cleared by the [place] FDA and has been authorized for detection and/or diagnosis of SARS-CoV-2 by FDA under an Emergency Use Authorization (EUA).  This EUA will remain in effect (meaning this test can be used)  for the duration of the COVID-19 declaration under Section 564(b)(1) of the Act, 21 U.S.C. section 360bbb-3(b)(1), unless the authorization is terminated or revoked sooner.  Performed at [facility], [address], [place], [place] [address]   Surgical pcr screen     Status: None   Collection Time: 07/13/19 10:41 PM   Specimen: Nasal Mucosa; Nasal Swab  Result Value Ref Range Status   MRSA, PCR NEGATIVE NEGATIVE Final   Staphylococcus aureus NEGATIVE NEGATIVE Final    Comment: (NOTE) The Xpert SA Assay (FDA approved for NASAL specimens in patients 66 years of age and older), is one component of a comprehensive surveillance program. It is not intended to diagnose infection nor to guide or monitor treatment. Performed at [facility], [place] [address] [place], [place] [address]   C Difficile Quick Screen w PCR reflex     Status: Abnormal   Collection Time: 07/14/19 12:46 AM   Specimen: STOOL  Result Value Ref Range Status   C Diff antigen POSITIVE (A) NEGATIVE Final   C Diff toxin NEGATIVE NEGATIVE Final   C Diff interpretation Results are indeterminate. See PCR results.  Final    Comment: Performed at [facility], [place] [address], [place], [place] [address]  C. Diff by PCR, Reflexed     Status: Abnormal   Collection Time: 07/14/19 12:46 AM  Result Value Ref Range Status   Toxigenic C. Difficile by PCR POSITIVE (A) NEGATIVE Final    Comment: Positive for toxigenic C. difficile with little to no toxin production. Only treat if clinical presentation suggests symptomatic illness. Performed at [facility], [place] [address] [place], [place] [address]          Radiology Studies: MR THORACIC SPINE WO CONTRAST  Result Date: 07/13/2019 CLINICAL DATA:  Back pain.  History of recent lumbar fusion. EXAM: MRI THORACIC SPINE WITHOUT CONTRAST TECHNIQUE: Multiplanar, multisequence MR imaging of the thoracic spine was performed. No intravenous contrast was  administered. COMPARISON:  None. FINDINGS: Alignment:  Normal Vertebrae: No bone lesions or fractures. No findings suspicious for discitis or osteomyelitis. Cord: Grossly normal cord signal intensity. No cord lesions or syrinx. Paraspinal and other soft tissues: No significant paraspinal or retroperitoneal findings. Disc levels: Small scattered thoracic disc protrusions but no significant disc protrusions, spinal or foraminal stenosis. IMPRESSION: 1. No significant thoracic disc protrusions, spinal or foraminal stenosis. 2. No findings  suspicious for discitis or osteomyelitis. 3. Normal appearance of the thoracic spinal cord. Electronically Signed   By: Marijo Sanes M.D.   On: 07/13/2019 14:02   MR LUMBAR SPINE WO CONTRAST  Result Date: 07/13/2019 CLINICAL DATA:  History of lumbar fusion approximately 3 weeks ago. Patient with back pain. EXAM: MRI LUMBAR SPINE WITHOUT CONTRAST TECHNIQUE: Multiplanar, multisequence MR imaging of the lumbar spine was performed. No intravenous contrast was administered. COMPARISON:  Lumbar spine MRI 06/03/2019 FINDINGS: Segmentation: There are five lumbar type vertebral bodies. The last full intervertebral disc space is labeled L5-S1. L2-L5 fusion hardware noted. Alignment:  Normal overall alignment. Vertebrae: Significant artifact from the hardware but no obvious acute bony findings. Conus medullaris and cauda equina: Conus extends to the L1-2 level. Conus and cauda equina appear normal. Paraspinal and other soft tissues: There is a large complex fluid collection in the laminectomy bed from L2-L5. It measures approximately 6.6 x 4.0 x 4.1 cm. No significant increased T1 signal intensity to suggest this is a hematoma. There is moderate mass effect on the posterior aspect of the thecal sac and findings worrisome for abscess or CSF leak. There is also a large complex fluid collection in the subcutaneous fat measuring 13.5 cm in length. Disc levels: I do not see any findings  suspicious for discitis or osteomyelitis without contrast. There is fairly significant mass effect on the thecal sac from L2-3 down to L4-5. IMPRESSION: 1. Postoperative changes with L2-L5 fusion hardware. 2. Large complex fluid collection in the laminectomy bed from L2-L5. This measures approximately 6.6 x 4.0 x 4.1 cm. There is moderate mass effect on the posterior aspect of the thecal sac and findings worrisome for abscess or CSF leak. Recommend surgical consultation. 3. Large complex subcutaneous fluid collection measuring 13.5 cm in length. 4. No findings suspicious for discitis or osteomyelitis without contrast. Electronically Signed   By: Marijo Sanes M.D.   On: 07/13/2019 14:01      Scheduled Meds: . acetaminophen  650 mg Oral Q8H  . citalopram  20 mg Oral Daily  . meloxicam  15 mg Oral Daily  . pantoprazole  40 mg Oral Daily  . traMADol  100 mg Oral Q8H   Continuous Infusions: . sodium chloride 125 mL/hr at 07/13/19 2304     LOS: 1 day      Debbe Odea, MD [facility] Pager: [email or web address] 07/14/2019, 12:24 PM

## 2019-07-14 NOTE — Anesthesia Procedure Notes (Signed)
Procedure Name: Intubation Date/Time: 07/14/2019 4:14 PM Performed by: Arneta Mahmood T, CRNA Pre-anesthesia Checklist: Patient identified, Emergency Drugs available, Suction available and Patient being monitored Patient Re-evaluated:Patient Re-evaluated prior to induction Oxygen Delivery Method: Circle system utilized Preoxygenation: Pre-oxygenation with 100% oxygen Induction Type: IV induction Ventilation: Mask ventilation without difficulty Laryngoscope Size: Mac and 3 Grade View: Grade I Tube type: Oral Tube size: 7.0 mm Number of attempts: 1 Airway Equipment and Method: Stylet and Oral airway Placement Confirmation: ETT inserted through vocal cords under direct vision,  positive ETCO2 and breath sounds checked- equal and bilateral Secured at: 22 cm Tube secured with: Tape Dental Injury: Teeth and Oropharynx as per pre-operative assessment

## 2019-07-14 NOTE — Transfer of Care (Signed)
Immediate Anesthesia Transfer of Care Note  Patient: Selena Rodgers  Procedure(s) Performed: LUMBAR EXPLORATION EVACUATION OF HEMATOMA (N/A Spine Lumbar)  Patient Location: PACU  Anesthesia Type:General  Level of Consciousness: awake, alert  and oriented  Airway & Oxygen Therapy: Patient Spontanous Breathing and Patient connected to nasal cannula oxygen  Post-op Assessment: Report given to RN, Post -op Vital signs reviewed and stable and Patient moving all extremities  Post vital signs: Reviewed and stable  Last Vitals:  Vitals Value Taken Time  BP 110/50 07/14/19 1742  Temp 36.5 C 07/14/19 1742  Pulse 71 07/14/19 1750  Resp 14 07/14/19 1750  SpO2 97 % 07/14/19 1750  Vitals shown include unvalidated device data.  Last Pain:  Vitals:   07/14/19 0820  TempSrc: Oral  PainSc:          Complications: No complications documented.

## 2019-07-14 NOTE — Anesthesia Preprocedure Evaluation (Addendum)
Anesthesia Evaluation  Patient identified by MRN, date of birth, ID band Patient awake    Reviewed: Allergy & Precautions, NPO status , Patient's Chart, lab work & pertinent test results  Airway Mallampati: II  TM Distance: >3 FB Neck ROM: Full    Dental  (+) Dental Advisory Given   Pulmonary neg pulmonary ROS, Current Smoker,    Pulmonary exam normal breath sounds clear to auscultation       Cardiovascular hypertension, Normal cardiovascular exam Rhythm:Regular Rate:Normal     Neuro/Psych PSYCHIATRIC DISORDERS Anxiety Depression negative neurological ROS     GI/Hepatic Neg liver ROS, GERD  ,  Endo/Other  Morbid obesity  Renal/GU negative Renal ROS  negative genitourinary   Musculoskeletal  (+) Arthritis ,   Abdominal (+) + obese,   Peds negative pediatric ROS (+)  Hematology negative hematology ROS (+)   Anesthesia Other Findings   Reproductive/Obstetrics negative OB ROS                           Anesthesia Physical Anesthesia Plan  ASA: III  Anesthesia Plan: General   Post-op Pain Management:    Induction: Intravenous  PONV Risk Score and Plan: 3 and Ondansetron, Dexamethasone, Midazolam and Treatment may vary due to age or medical condition  Airway Management Planned: Oral ETT  Additional Equipment: None  Intra-op Plan:   Post-operative Plan: Extubation in OR  Informed Consent: I have reviewed the patients History and Physical, chart, labs and discussed the procedure including the risks, benefits and alternatives for the proposed anesthesia with the patient or authorized representative who has indicated his/her understanding and acceptance.     Dental advisory given  Plan Discussed with: CRNA  Anesthesia Plan Comments:        Anesthesia Quick Evaluation

## 2019-07-14 NOTE — Op Note (Signed)
Brief history: The patient is a 69 year old obese white female on whom I performed a lumbar decompression and fusion on 06/25/2019.  The patient had a prolonged hospital course and was discharged to a skilled nursing facility.  She left AMA and initially did well at home but has developed progressive lower extremity weakness.  She was worked up with a lumbar MRI which demonstrated epidural fluid.  I discussed the situation with the patient and recommended an exploration of her lumbar wound.  She has weighed the risks, benefits and alternatives and decided to proceed with surgery.  Preop diagnosis: Epidural mass, paraparesis, spinal stenosis  Postop diagnosis: The same  Procedure: Exploration of lumbar wound, evacuation of epidural hematoma  Surgeon: Dr. Earle Gell  Assistant: None  Anesthesia: General tracheal  Estimated blood loss: Minimal  Specimens: Cultures  Drains: 1 large epidural Hemovac drain  Complications: None  Description of procedure: The patient was brought to the operating room by the anesthesia team.  Because of her body habitus she was placed in the prone position on the Calhoun table.  Her lumbosacral region was then prepared with Betadine scrub and Betadine solution.  Sterile drapes were applied.  I then used a scalpel to incise for the patient's fresh surgical scar.  Just below the subcutaneous tissue was dark thin liquid consistent with a liquefied hematoma.  I used the cerebellar retractor for exposure.  I ligated the fascial sutures and dissected down to the epidural space.  We encountered more fluid.  We obtain cultures although I did not see any obvious infection.  I inspected the dura.  It appeared intact and at this point was not compressed.  We irrigated the wound out with bacitracin solution.  I obtain hemostasis with bipolar electrocautery.  I placed a large Hemovac drain in the epidural space and tunneled it out through a separate stab wound.  I then remove the  retractors and reapproximated patient's lumbar fascia with interrupted #1 Vicryl suture.  I reapproximated the subcutaneous tissue with interrupted 2-0 Vicryl suture.  I reapproximated the skin with a running 2-0 nylon suture.  The wound was then coated with bacitracin ointment.  A sterile dressing was applied.  The drapes were removed.  The patient was then returned to the supine position.  By report all sponge, instrument, and needle counts were correct at the end of this case.

## 2019-07-14 NOTE — Plan of Care (Signed)

## 2019-07-15 ENCOUNTER — Encounter (HOSPITAL_COMMUNITY): Payer: Self-pay | Admitting: Neurosurgery

## 2019-07-15 DIAGNOSIS — R202 Paresthesia of skin: Secondary | ICD-10-CM

## 2019-07-15 DIAGNOSIS — T8189XD Other complications of procedures, not elsewhere classified, subsequent encounter: Secondary | ICD-10-CM

## 2019-07-15 LAB — CBC
HCT: 30.1 % — ABNORMAL LOW (ref 36.0–46.0)
Hemoglobin: 9.4 g/dL — ABNORMAL LOW (ref 12.0–15.0)
MCH: 29.7 pg (ref 26.0–34.0)
MCHC: 31.2 g/dL (ref 30.0–36.0)
MCV: 95.3 fL (ref 80.0–100.0)
Platelets: 229 10*3/uL (ref 150–400)
RBC: 3.16 MIL/uL — ABNORMAL LOW (ref 3.87–5.11)
RDW: 13.2 % (ref 11.5–15.5)
WBC: 9.6 10*3/uL (ref 4.0–10.5)
nRBC: 0 % (ref 0.0–0.2)

## 2019-07-15 LAB — BASIC METABOLIC PANEL
Anion gap: 9 (ref 5–15)
BUN: 7 mg/dL — ABNORMAL LOW (ref 8–23)
CO2: 26 mmol/L (ref 22–32)
Calcium: 8.2 mg/dL — ABNORMAL LOW (ref 8.9–10.3)
Chloride: 103 mmol/L (ref 98–111)
Creatinine, Ser: 0.65 mg/dL (ref 0.44–1.00)
GFR calc Af Amer: >60 mL/min (ref >60)
GFR calc non Af Amer: >60 mL/min (ref >60)
Glucose, Bld: 99 mg/dL (ref 70–99)
Potassium: 4.2 mmol/L (ref 3.5–5.1)
Sodium: 138 mmol/L (ref 135–145)

## 2019-07-15 NOTE — Progress Notes (Signed)
PROGRESS NOTE    Selena Rodgers   TGY:[id number]  DOB: 10/25/1950  DOA: 07/13/2019 PCP: [name], MD   Brief Narrative:  Selena Rodgers is a 69 y.o. female with medical history significant for chronic low back pain, spinal stenosis, large ventral hernia, depression/anxiety, GERD and arthritis.  She underwent laminectomy by [name] on June 23 of this year secondary to lumbar spondylolithiasis and severe spinal stenosis.  She was discharged to a SNF left there AMA after a few days.  She has been at home for the last 9 days.  She reports that since getting home she has been sitting on recliner or bed bound and has not had difficulty ambulating and caring for self.  Her sister was staying with her to help her.  She reports that for the first few days she gets that the recliner and extend and flex her legs to help exercise them for the last 3 days she has not been able to move her right leg.  She is also had tingling and numbness in her right leg from her hip to her ankle. She went to the emergency room at [facility] this morning secondary to weakness and pain in her right leg.  MRI lumbar spine shows postoperative changes with L2-L5 fusion hardware.  Large complex fluid collection in the laminectomy bed from L2-L5 this measures approximately 6.6 x 4.0 x 4.1 cm.  Moderate mass-effect on the posterior aspect of thecal sac and findings worrisome for abscess or CSF leak.  Case was discussed with neurosurgery here at [facility] who recommended patient be transferred to our facility for neurosurgical evaluation.  MRI lumbar spine shows postoperative changes with L2-L5 fusion hardware.  Large complex fluid collection in the laminectomy bed from L2-L5 this measures approximately 6.6 x 4.0 x 4.1 cm.  Moderate mass-effect on the posterior aspect of thecal sac and findings worrisome for abscess or CSF leak.  Case was discussed with neurosurgery here at [facility] who recommended  patient be transferred to our facility for neurosurgical evaluation. '   She reports she has been having loose watery diarrhea for the last 3 to 4 days.  She reports she has had more than 12 episodes of watery diarrhea a day for the last 2 days.   Subjective: No more watery stools since admission. She underwent lumbar wound exploration yesterday with evacuation of a hematoma- she has a drain present.  Assessment & Plan:   Principal Problem:   Weakness and Paresthesia of right lower extremity S/p laminectomy of L2-3, L3-4, L4-5 - s/p exploration and drainage of hematoma yesterday - f/u on drain output  Active Problems:     Diarrhea - C diff antigen and PCR positive, normal WBC count, no fever, no tenderness on exam-  - following off of medications for now - no diarrhea since being admitted to the hospital thus far   Morbid obesity  - needs to lose weight  Large ventral hernia - non tender- difficult to completely reduce       Time spent in minutes: 35 DVT prophylaxis: SCD's Start: 07/14/19 2033 SCDs Start: 07/13/19 2228  Code Status: Full code Family Communication:  Disposition Plan:  Status is: Inpatient  Remains inpatient appropriate because: s/p lumbar surgery   Dispo: The patient is from: Home              Anticipated d/c is to: TBD  Anticipated d/c date is: > 3 days              Patient currently is not medically stable to d/c.      Consultants:   NS Procedures:   I and D of lumbar wound, evacuation of hematome Antimicrobials:  Anti-infectives (From admission, onward)   Start     Dose/Rate Route Frequency Ordered Stop   07/15/19 0200  ceFAZolin (ANCEF) IVPB 2g/100 mL premix        2 g 200 mL/hr over 30 Minutes Intravenous Every 8 hours 07/14/19 2032 07/15/19 0953   07/14/19 1703  bacitracin 50,000 Units in sodium chloride 0.9 % 500 mL irrigation  Status:  Discontinued          As needed 07/14/19 1703 07/14/19 1736        Objective: Vitals:   07/14/19 1922 07/15/19 0010 07/15/19 0357 07/15/19 0739  BP: (!) 127/52 131/90 (!) 126/53 (!) 122/41  Pulse: 71 73 67 70  Resp: 17 19 17 17   Temp: 98 F (36.7 C) 98.2 F (36.8 C) 98.5 F (36.9 C) 98.7 F (37.1 C)  TempSrc: Oral Oral  Oral  SpO2: 91% 96% 94% 96%  Weight:      Height:        Intake/Output Summary (Last 24 hours) at 07/15/2019 1007 Last data filed at 07/15/2019 0900 Gross per 24 hour  Intake 2768.33 ml  Output 825 ml  Net 1943.33 ml   Filed Weights   07/14/19 1248  Weight: 121 kg    Examination: General exam: Appears comfortable  HEENT: PERRLA, oral mucosa moist, no sclera icterus or thrush Respiratory system: Clear to auscultation. Respiratory effort normal. Cardiovascular system: S1 & S2 heard,  No murmurs  Gastrointestinal system: Abdomen soft, non-tender, nondistended. Normal bowel sounds  - large left lower quadrant abdominal hernia- not fully reducible- none tender Central nervous system: Alert and oriented. No focal neurological deficits. Extremities: No cyanosis, clubbing or edema Back: drain in back noted Skin: No rashes or ulcers Psychiatry:  Mood & affect appropriate.    Data Reviewed: I have personally reviewed following labs and imaging studies  CBC: Recent Labs  Lab 07/13/19 1023 07/14/19 0319 07/15/19 0416  WBC 12.8* 9.9 9.6  NEUTROABS 9.1*  --   --   HGB 11.7* 10.3* 9.4*  HCT 35.2* 33.2* 30.1*  MCV 90.5 93.3 95.3  PLT 307 271 948   Basic Metabolic Panel: Recent Labs  Lab 07/13/19 1023 07/14/19 0319 07/15/19 0416  NA 133* 137 138  K 4.8 4.4 4.2  CL 99 102 103  CO2 25 28 26   GLUCOSE 133* 128* 99  BUN 14 7* 7*  CREATININE 0.72 0.72 0.65  CALCIUM 8.6* 8.8* 8.2*   GFR: Estimated Creatinine Clearance: 93.3 mL/min (by C-G formula based on SCr of 0.65 mg/dL). Liver Function Tests: Recent Labs  Lab 07/13/19 1023  AST 18  ALT 8  ALKPHOS 140*  BILITOT 0.8  PROT 7.2  ALBUMIN 3.3*   No  results for input(s): LIPASE, AMYLASE in the last 168 hours. No results for input(s): AMMONIA in the last 168 hours. Coagulation Profile: No results for input(s): INR, PROTIME in the last 168 hours. Cardiac Enzymes: No results for input(s): CKTOTAL, CKMB, CKMBINDEX, TROPONINI in the last 168 hours. BNP (last 3 results) No results for input(s): PROBNP in the last 8760 hours. HbA1C: No results for input(s): HGBA1C in the last 72 hours. CBG: No results for input(s): GLUCAP in the last 168 hours.  Lipid Profile: No results for input(s): CHOL, HDL, LDLCALC, TRIG, CHOLHDL, LDLDIRECT in the last 72 hours. Thyroid Function Tests: No results for input(s): TSH, T4TOTAL, FREET4, T3FREE, THYROIDAB in the last 72 hours. Anemia Panel: No results for input(s): VITAMINB12, FOLATE, FERRITIN, TIBC, IRON, RETICCTPCT in the last 72 hours. Urine analysis:    Component Value Date/Time   COLORURINE Yellow 12/27/2013 1240   APPEARANCEUR Turbid 12/27/2013 1240   LABSPEC 1.026 12/27/2013 1240   PHURINE 5.0 12/27/2013 1240   GLUCOSEU Negative 12/27/2013 1240   HGBUR 2+ 12/27/2013 1240   BILIRUBINUR Negative 12/27/2013 1240   KETONESUR Negative 12/27/2013 1240   PROTEINUR 30 mg/dL 12/27/2013 1240   NITRITE Negative 12/27/2013 1240   LEUKOCYTESUR Negative 12/27/2013 1240   Sepsis Labs: @LABRCNTIP (procalcitonin:4,lacticidven:4) ) Recent Results (from the past 240 hour(s))  SARS Coronavirus 2 by RT PCR (hospital order, performed in [place] hospital lab) Nasopharyngeal Nasopharyngeal Swab     Status: None   Collection Time: 07/13/19 10:23 AM   Specimen: Nasopharyngeal Swab  Result Value Ref Range Status   SARS Coronavirus 2 NEGATIVE NEGATIVE Final    Comment: (NOTE) SARS-CoV-2 target nucleic acids are NOT DETECTED.  The SARS-CoV-2 RNA is generally detectable in upper and lower respiratory specimens during the acute phase of infection. The lowest concentration of SARS-CoV-2 viral copies this assay  can detect is 250 copies / mL. A negative result does not preclude SARS-CoV-2 infection and should not be used as the sole basis for treatment or other patient management decisions.  A negative result may occur with improper specimen collection / handling, submission of specimen other than nasopharyngeal swab, presence of viral mutation(s) within the areas targeted by this assay, and inadequate number of viral copies (<250 copies / mL). A negative result must be combined with clinical observations, patient history, and epidemiological information.  Fact Sheet for Patients:   StrictlyIdeas.no  Fact Sheet for Healthcare Providers: BankingDealers.co.za  This test is not yet approved or  cleared by the [place] FDA and has been authorized for detection and/or diagnosis of SARS-CoV-2 by FDA under an Emergency Use Authorization (EUA).  This EUA will remain in effect (meaning this test can be used) for the duration of the COVID-19 declaration under Section 564(b)(1) of the Act, 21 U.S.C. section 360bbb-3(b)(1), unless the authorization is terminated or revoked sooner.  Performed at [facility], [address] [place], [place] [address]   Surgical pcr screen     Status: None   Collection Time: 07/13/19 10:41 PM   Specimen: Nasal Mucosa; Nasal Swab  Result Value Ref Range Status   MRSA, PCR NEGATIVE NEGATIVE Final   Staphylococcus aureus NEGATIVE NEGATIVE Final    Comment: (NOTE) The Xpert SA Assay (FDA approved for NASAL specimens in patients 13 years of age and older), is one component of a comprehensive surveillance program. It is not intended to diagnose infection nor to guide or monitor treatment. Performed at [facility], [place] [address], [place], [place] [address]   C Difficile Quick Screen w PCR reflex     Status: Abnormal   Collection Time: 07/14/19 12:46 AM   Specimen: STOOL  Result Value Ref Range  Status   C Diff antigen POSITIVE (A) NEGATIVE Final   C Diff toxin NEGATIVE NEGATIVE Final   C Diff interpretation Results are indeterminate. See PCR results.  Final    Comment: Performed at [facility], [place] [address] [place], [place] [address]  C. Diff by PCR,  Reflexed     Status: Abnormal   Collection Time: 07/14/19 12:46 AM  Result Value Ref Range Status   Toxigenic C. Difficile by PCR POSITIVE (A) NEGATIVE Final    Comment: Positive for toxigenic C. difficile with little to no toxin production. Only treat if clinical presentation suggests symptomatic illness. Performed at [facility], [place] [address], [place], [place] [address]   Aerobic/Anaerobic Culture (surgical/deep wound)     Status: None (Preliminary result)   Collection Time: 07/14/19  5:08 PM   Specimen: Soft Tissue, Other  Result Value Ref Range Status   Specimen Description WOUND  Final   Special Requests EPIDURAL FLUID SWAB  Final   Gram Stain   Final    FEW WBC PRESENT, PREDOMINANTLY PMN NO ORGANISMS SEEN Performed at [facility], [place] [address] [place],  [address]    Culture PENDING  Incomplete   Report Status PENDING  Incomplete         Radiology Studies: MR THORACIC SPINE WO CONTRAST  Result Date: 07/13/2019 CLINICAL DATA:  Back pain.  History of recent lumbar fusion. EXAM: MRI THORACIC SPINE WITHOUT CONTRAST TECHNIQUE: Multiplanar, multisequence MR imaging of the thoracic spine was performed. No intravenous contrast was administered. COMPARISON:  None. FINDINGS: Alignment:  Normal Vertebrae: No bone lesions or fractures. No findings suspicious for discitis or osteomyelitis. Cord: Grossly normal cord signal intensity. No cord lesions or syrinx. Paraspinal and other soft tissues: No significant paraspinal or retroperitoneal findings. Disc levels: Small scattered thoracic disc protrusions but no significant disc protrusions, spinal or foraminal stenosis. IMPRESSION: 1. No significant  thoracic disc protrusions, spinal or foraminal stenosis. 2. No findings suspicious for discitis or osteomyelitis. 3. Normal appearance of the thoracic spinal cord. Electronically Signed   By: Marijo Sanes M.D.   On: 07/13/2019 14:02   MR LUMBAR SPINE WO CONTRAST  Result Date: 07/13/2019 CLINICAL DATA:  History of lumbar fusion approximately 3 weeks ago. Patient with back pain. EXAM: MRI LUMBAR SPINE WITHOUT CONTRAST TECHNIQUE: Multiplanar, multisequence MR imaging of the lumbar spine was performed. No intravenous contrast was administered. COMPARISON:  Lumbar spine MRI 06/03/2019 FINDINGS: Segmentation: There are five lumbar type vertebral bodies. The last full intervertebral disc space is labeled L5-S1. L2-L5 fusion hardware noted. Alignment:  Normal overall alignment. Vertebrae: Significant artifact from the hardware but no obvious acute bony findings. Conus medullaris and cauda equina: Conus extends to the L1-2 level. Conus and cauda equina appear normal. Paraspinal and other soft tissues: There is a large complex fluid collection in the laminectomy bed from L2-L5. It measures approximately 6.6 x 4.0 x 4.1 cm. No significant increased T1 signal intensity to suggest this is a hematoma. There is moderate mass effect on the posterior aspect of the thecal sac and findings worrisome for abscess or CSF leak. There is also a large complex fluid collection in the subcutaneous fat measuring 13.5 cm in length. Disc levels: I do not see any findings suspicious for discitis or osteomyelitis without contrast. There is fairly significant mass effect on the thecal sac from L2-3 down to L4-5. IMPRESSION: 1. Postoperative changes with L2-L5 fusion hardware. 2. Large complex fluid collection in the laminectomy bed from L2-L5. This measures approximately 6.6 x 4.0 x 4.1 cm. There is moderate mass effect on the posterior aspect of the thecal sac and findings worrisome for abscess or CSF leak. Recommend surgical consultation. 3.  Large complex subcutaneous fluid collection measuring 13.5 cm in length. 4. No findings suspicious for  discitis or osteomyelitis without contrast. Electronically Signed   By: Marijo Sanes M.D.   On: 07/13/2019 14:01      Scheduled Meds: . acetaminophen  1,000 mg Oral Q6H  . citalopram  20 mg Oral Daily  . docusate sodium  100 mg Oral BID  . pantoprazole  40 mg Oral Daily  . sodium chloride flush  3 mL Intravenous Q12H  . traMADol  100 mg Oral Q8H   Continuous Infusions: . sodium chloride 125 mL/hr at 07/14/19 2314  . sodium chloride       LOS: 2 days      Debbe Odea, MD [facility] Pager: [email or web address] 07/15/2019, 10:07 AM

## 2019-07-15 NOTE — Progress Notes (Signed)
Occupational Therapy Evaluation Patient Details Name: Selena Rodgers MRN: [id number] DOB: 07-16-1950 Today's Date: 07/15/2019    History of Present Illness Selena Rodgers is a 69 y.o. female with medical history significant for chronic low back pain, spinal stenosis, large ventral hernia, depression/anxiety, GERD and arthritis.  She underwent laminectomy by [name] on June 23 of this year secondary to lumbar spondylolithiasis and severe spinal stenosis.  She was discharged to a SNF left there AMA after a few days.  She has been at home for the last 9 days.I and D of lumbar wound, evacuation of hematome   Clinical Impression   Patient here 6/23 for lumbar spondylolithiasis and discharged to SNF.  She left AMA and returned home with help of sister.  Patient states she was doing "very well" at home until R leg started feeling weak, though she says she was sitting in lift chair all day and sleeping there.  Prior to June surgery patient using RW and able to complete ADLs independently.  Today patient very motivated, though tearful.  She needs increased cueing for safety and sequencing.  Able to get to EOB with max assist and complete seated grooming with close supervision.  Requiring max-total assist with LB ADLs and min with UB dressing/bathing.  Patient needing increased level of care on discharge.  If refusing SNF she will need caregiver or aid.  Will continue to follow with OT acutely to address the deficits listed below.      Follow Up Recommendations  SNF;Supervision/Assistance - 24 hour (Patient likely refusing SNF due to last experience)    Equipment Recommendations  Other (comment) (defer to next venue)    Recommendations for Other Services       Precautions / Restrictions Precautions Precautions: Fall;Back;Other (comment) (lumbar drain) Precaution Comments: reviewed with pt throughout Required Braces or Orthoses: Spinal Brace Spinal Brace: Applied in sitting position;Lumbar  corset Restrictions Weight Bearing Restrictions: No      Mobility Bed Mobility Overal bed mobility: Needs Assistance Bed Mobility: Rolling;Sidelying to Sit;Sit to Sidelying Rolling: Min assist Sidelying to sit: Max assist     Sit to sidelying: Mod assist General bed mobility comments: Cues for sequencing. Able to move LE off bed when coming up to sit but needing max assist with bring trunk up. Help with getting LE back in bed   Transfers                 General transfer comment: unable to attempt this session    Balance Overall balance assessment: Needs assistance Sitting-balance support: Feet supported Sitting balance-Leahy Scale: Fair Sitting balance - Comments: Initially needing mod assist but quickly progressed to being able to sit EOB and perform grooming with supervision                                   ADL either performed or assessed with clinical judgement   ADL Overall ADL's : Needs assistance/impaired Eating/Feeding: Sitting;Independent   Grooming: Oral care;Wash/dry hands;Wash/dry face;Set up;Sitting   Upper Body Bathing: Minimal assistance;Sitting   Lower Body Bathing: Maximal assistance;Sitting/lateral leans   Upper Body Dressing : Minimal assistance;Sitting   Lower Body Dressing: Maximal assistance;Sitting/lateral leans               Functional mobility during ADLs: Maximal assistance (bed mobility)       Vision Baseline Vision/History: Wears glasses Wears Glasses: At all times Patient Visual Report: No  change from baseline       Perception     Praxis      Pertinent Vitals/Pain Pain Assessment: Faces Faces Pain Scale: Hurts little more Pain Location: Back, general discomfort Pain Descriptors / Indicators: Aching;Discomfort;Sore Pain Intervention(s): Limited activity within patient's tolerance;Monitored during session;Repositioned     Hand Dominance Right   Extremity/Trunk Assessment Upper Extremity  Assessment Upper Extremity Assessment: Generalized weakness (arthitis, more so in R shoulder)   Lower Extremity Assessment Lower Extremity Assessment: Defer to PT evaluation (noted R leg tingling)   Cervical / Trunk Assessment Cervical / Trunk Assessment: Other exceptions (lumbar drain)   Communication Communication Communication: No difficulties   Cognition Arousal/Alertness: Awake/alert Behavior During Therapy: WFL for tasks assessed/performed Overall Cognitive Status: No family/caregiver present to determine baseline cognitive functioning Area of Impairment: Problem solving;Safety/judgement;Awareness                       Following Commands: Follows multi-step commands with increased time Safety/Judgement: Decreased awareness of safety Awareness: Emergent Problem Solving: Difficulty sequencing;Slow processing General Comments: Patient requiring assist with sequencing and problem solving.  Unable to process steps on how to log roll/sit up. Tearful multiple times during session    General Comments  Paient on 2L O2 and SpO2 > 95.     Exercises     Shoulder Instructions      Home Living Family/patient expects to be discharged to:: Private residence Living Arrangements: Alone Available Help at Discharge: Available PRN/intermittently Type of Home: House Home Access: Ramped entrance     Home Layout: One level     Bathroom Shower/Tub: Tub/shower unit;Curtain   Biochemist, clinical: Standard Bathroom Accessibility: No   Home Equipment: Grab bars - toilet;Grab bars - tub/shower;Hand held shower head;Shower seat;Bedside commode;Adaptive equipment;Walker - 4 wheels;Other (comment) (lift chair) Adaptive Equipment: Reacher;Sock aid;Long-handled sponge Additional Comments: Sister stayed with her between rehab and this admission.       Prior Functioning/Environment Level of Independence: Needs assistance  Gait / Transfers Assistance Needed: Not walking or getting OOB at  all between recent rehab and this admission.   ADL's / Homemaking Assistance Needed: Requiring assist with everything at home from sister between rehab visit and this admission.  Was sleeping in lift chair, sister putting bed pan under her.   Comments: Has been using a RW since 2017; family assists with IADL tasks however pt did her own launndry and cooking; driving until may        OT Problem List: Decreased strength;Decreased range of motion;Impaired balance (sitting and/or standing);Decreased safety awareness;Decreased knowledge of use of DME or AE;Decreased knowledge of precautions;Obesity;Pain      OT Treatment/Interventions: Self-care/ADL training;Therapeutic exercise;DME and/or AE instruction;Therapeutic activities;Patient/family education;Balance training    OT Goals(Current goals can be found in the care plan section) Acute Rehab OT Goals Patient Stated Goal: to do more for herself OT Goal Formulation: With patient Time For Goal Achievement: 07/29/19 Potential to Achieve Goals: Good  OT Frequency: Min 2X/week   Barriers to D/C: Decreased caregiver support  Needing > +1 assist for mobility at this time       Co-evaluation              AM-PAC OT "6 Clicks" Daily Activity     Outcome Measure Help from another person eating meals?: None Help from another person taking care of personal grooming?: A Little Help from another person toileting, which includes using toliet, bedpan, or urinal?: Total Help from another person bathing (  including washing, rinsing, drying)?: A Lot Help from another person to put on and taking off regular upper body clothing?: A Lot Help from another person to put on and taking off regular lower body clothing?: A Lot 6 Click Score: 14   End of Session Equipment Utilized During Treatment: Oxygen Nurse Communication: Mobility status  Activity Tolerance: Patient tolerated treatment well Patient left: in bed;with call bell/phone within reach;with  bed alarm set  OT Visit Diagnosis: Unsteadiness on feet (R26.81);Other abnormalities of gait and mobility (R26.89);Muscle weakness (generalized) (M62.81);Repeated falls (R29.6);Pain Pain - part of body:  (back)                Time: 3729-0211 OT Time Calculation (min): 53 min Charges:  OT General Charges $OT Visit: 1 Visit OT Evaluation $OT Eval Moderate Complexity: 1 Mod OT Treatments $Self Care/Home Management : 23-37 mins $Therapeutic Activity: 8-22 mins  August Luz, OTR/L   Phylliss Bob 07/15/2019, 1:17 PM

## 2019-07-15 NOTE — Evaluation (Signed)
Physical Therapy Evaluation Patient Details Name: Selena Rodgers MRN: [id number] DOB: Jan 06, 1950 Today's Date: 07/15/2019   History of Present Illness  Selena Rodgers is a 69 y.o. female with medical history significant for chronic low back pain, spinal stenosis, large ventral hernia, depression/anxiety, GERD and arthritis.  She underwent laminectomy by [name] on June 23 of this year secondary to lumbar spondylolithiasis and severe spinal stenosis.  She was discharged to a SNF left there AMA after a few days.  She has been at home for the last 9 days.I and D of lumbar wound, evacuation of hematome  Clinical Impression  Patient presents with decreased mobility and since her back surgery in June has not walked.  She has some strength in R leg and some improving sensation since recent I&D and is hopeful to improve mobility.  Today she was able to attempt sit to stand to RW x 5 with +1 A but unable to clear her hips.  She is hopeful to qualify for inpatient rehab prior to d/c home with sister there to assist.  PT to follow acutely.     Follow Up Recommendations CIR    Equipment Recommendations  None recommended by PT    Recommendations for Other Services Rehab consult     Precautions / Restrictions Precautions Precautions: Fall;Back;Other (comment) (lumbar drain) Required Braces or Orthoses: Spinal Brace Spinal Brace: Applied in sitting position;Lumbar corset      Mobility  Bed Mobility Overal bed mobility: Needs Assistance Bed Mobility: Sidelying to Sit   Sidelying to sit: Max assist     Sit to sidelying: Mod assist General bed mobility comments: able to get legs off bed, heavy lifting help for trunk, assist for legs back into bed, pt able to pull herself up in bed with rails and bed in trendelenberg  Transfers Overall transfer level: Needs assistance Equipment used: Rolling walker (2 wheeled) Transfers: Sit to/from Stand;Lateral/Scoot Transfers Sit to Stand: Max  assist;From elevated surface        Lateral/Scoot Transfers: Mod assist General transfer comment: attempted with max A from higher EOB x 5 but unable to clear hips to stand, side scooting toward HOB mod A but unable to scoot effectively to move up high enough in bed  Ambulation/Gait                Stairs            Wheelchair Mobility    Modified Rankin (Stroke Patients Only)       Balance Overall balance assessment: Needs assistance   Sitting balance-Leahy Scale: Fair Sitting balance - Comments: Sat EOB without UE support, combed her hair some in sitting                                     Pertinent Vitals/Pain Pain Assessment: Faces Faces Pain Scale: Hurts little more Pain Location: Back, general discomfort Pain Descriptors / Indicators: Aching;Discomfort;Sore Pain Intervention(s): Monitored during session;Repositioned    Home Living Family/patient expects to be discharged to:: Private residence Living Arrangements: Alone Available Help at Discharge: Available PRN/intermittently Type of Home: House Home Access: Ramped entrance     Home Layout: One level Home Equipment: Grab bars - toilet;Grab bars - tub/shower;Hand held shower head;Shower seat;Bedside commode;Adaptive equipment;Walker - 4 wheels;Other (comment) Additional Comments: Sister stayed with her between rehab and this admission.     Prior Function Level of Independence: Needs assistance  Gait / Transfers Assistance Needed: Not walking or getting OOB at all between recent rehab and this admission.    ADL's / Homemaking Assistance Needed: Requiring assist with everything at home from sister between rehab visit and this admission.  Was sleeping in lift chair, sister putting bed pan under her.  Comments: Has been using a RW since 2017; family assists with IADL tasks however pt did her own launndry and cooking; driving until may     Hand Dominance        Extremity/Trunk  Assessment   Upper Extremity Assessment Upper Extremity Assessment: Defer to OT evaluation    Lower Extremity Assessment Lower Extremity Assessment: LLE deficits/detail;RLE deficits/detail RLE Deficits / Details: AAROM WFL, strength hip flexion 3/5, knee extension 2-/5 RLE Sensation: decreased light touch LLE Deficits / Details: AROM WFL, strength hip flexion 3/5, knee extension 4-/5 LLE Sensation: decreased light touch    Cervical / Trunk Assessment Cervical / Trunk Assessment: Other exceptions Cervical / Trunk Exceptions: s/p spinal surgery, large hanging pannus and hernia on L side  Communication   Communication: No difficulties  Cognition Arousal/Alertness: Awake/alert Behavior During Therapy: WFL for tasks assessed/performed Overall Cognitive Status: No family/caregiver present to determine baseline cognitive functioning Area of Impairment: Safety/judgement;Following commands                       Following Commands: Follows multi-step commands with increased time Safety/Judgement: Decreased awareness of safety            General Comments      Exercises     Assessment/Plan    PT Assessment Patient needs continued PT services  PT Problem List Decreased strength;Decreased balance;Decreased knowledge of use of DME;Impaired sensation;Decreased safety awareness;Decreased mobility;Decreased knowledge of precautions       PT Treatment Interventions DME instruction;Therapeutic activities;Therapeutic exercise;Balance training;Functional mobility training;Wheelchair mobility training;Patient/family education    PT Goals (Current goals can be found in the Care Plan section)  Acute Rehab PT Goals Patient Stated Goal: to go to rehab then home PT Goal Formulation: With patient Time For Goal Achievement: 07/29/19 Potential to Achieve Goals: Fair    Frequency Min 3X/week   Barriers to discharge        Co-evaluation               AM-PAC PT "6 Clicks"  Mobility  Outcome Measure Help needed turning from your back to your side while in a flat bed without using bedrails?: A Lot Help needed moving from lying on your back to sitting on the side of a flat bed without using bedrails?: Total Help needed moving to and from a bed to a chair (including a wheelchair)?: Total Help needed standing up from a chair using your arms (e.g., wheelchair or bedside chair)?: Total Help needed to walk in hospital room?: Total Help needed climbing 3-5 steps with a railing? : Total 6 Click Score: 7    End of Session   Activity Tolerance: Patient tolerated treatment well Patient left: in bed;with call bell/phone within reach;with bed alarm set   PT Visit Diagnosis: Other abnormalities of gait and mobility (R26.89);Muscle weakness (generalized) (M62.81)    Time: 7591-6384 PT Time Calculation (min) (ACUTE ONLY): 34 min   Charges:   PT Evaluation $PT Eval Moderate Complexity: 1 Mod PT Treatments $Therapeutic Activity: 8-22 mins        Magda Kiel, PT Acute Rehabilitation Services YKZLD:[phone] [phone] 07/15/2019   Reginia Naas 07/15/2019, 6:21 PM

## 2019-07-15 NOTE — Consult Note (Signed)
Physical Medicine and Rehabilitation Consult Reason for Consult: Progressive lower extremity weakness Referring Physician: [facility]   HPI: Selena Rodgers is a 69 y.o. right-handed female with history of anxiety, depression, morbid obesity with BMI 41.77, GERD, hypertension as well as chronic back pain.  Patient with long complicated history undergoing a lumbar decompression and fusion 06/25/2019 with prolonged hospital course and was discharged to skilled nursing facility 07/04/2019 requiring total assist for sit to stand and moderate assist lateral scoot transfers and spinal brace applied in sitting position.  Patient did leave the facility AMA to home but developed progressive lower extremity weakness.  Per chart review patient lives alone in [place].  Her sister had been staying with her at nighttime.  1 level home with ramped entrance.  MRI of the thoracic lumbar spine showed large complex fluid collection in the laminotomy bed from L2-L5 measuring 6 x 6 x 4 0.0 x 4.1 cm with moderate mass-effect on the posterior aspect of the thecal sac and findings worrisome for abscess or CSF leak.  Large complex subcutaneous fluid collection measuring 13.5 cm in length.  No findings suspicious for discitis or osteomyelitis.  Admission chemistries with WBC 12,800, hemoglobin 11.7, sodium 133, C. difficile positive.  Patient underwent exploration of lumbar wound evacuation of epidural hematoma 07/14/2019 per [name].  Physical and occupational therapy evaluations are pending.  MD has requested physical medicine rehab consult.  Tested C Diff (+)- but LBM yesterday before admission- first time she's eaten in last few days was lunch today per pt.  Review of Systems  Constitutional: Negative for chills and fever.  HENT: Negative for hearing loss.   Eyes: Negative for blurred vision and double vision.  Respiratory: Negative for cough and shortness of breath.   Cardiovascular: Positive for leg swelling.  Negative for chest pain and palpitations.  Gastrointestinal: Positive for constipation. Negative for heartburn, nausea and vomiting.       GERD  Genitourinary: Negative for dysuria, flank pain and hematuria.  Musculoskeletal: Positive for back pain and myalgias.  Skin: Negative for rash.  Neurological: Positive for weakness.  Psychiatric/Behavioral: Positive for depression.       Anxiety  All other systems reviewed and are negative.  Past Medical History:  Diagnosis Date   Anxiety    Arthritis    Chronic back pain    Depression    GERD (gastroesophageal reflux disease)    Hypertension    Spinal stenosis    Umbilical hernia    Ventral hernia    Past Surgical History:  Procedure Laterality Date   ANTERIOR CERVICAL DECOMP/DISCECTOMY FUSION N/A 04/19/2015   Procedure: ANTERIOR CERVICAL DECOMPRESSION/DISCECTOMY FUSION INTERBODY PROTHESIS PLATING BONEGRAFT CERVICAL THREE-FOUR ,CERVICAL FOUR-FIVE;  Surgeon: Newman Pies, MD;  Location: [facility];  Service: Neurosurgery;  Laterality: N/A;   BREAST BIOPSY     CARPAL TUNNEL RELEASE     CHOLECYSTECTOMY     FOOT SURGERY Right    IRRIGATION AND DEBRIDEMENT ABDOMEN  01/10/2014   Dr. Marina Gravel   UMBILICAL HERNIA REPAIR     VENTRAL HERNIA REPAIR  12/26/2013   Dr. Pat Patrick   Family History  Problem Relation Age of Onset   Diabetes Maternal Grandmother    Diabetes Maternal Grandfather    Diabetes Paternal Grandmother    Diabetes Paternal Grandfather    Hypertension Mother    Social History:  reports that she has been smoking. She has never used smokeless tobacco. She reports that she does not drink alcohol  and does not use drugs. Allergies:  Allergies  Allergen Reactions   Chlorpheniramine Anaphylaxis   Linseed Oil Anaphylaxis   Codeine Hives   Flax Seed Oil [Bio-Flax] Swelling    UNSPECIFIED    Latex Hives and Other (See Comments)    BLISTERS   Tape Other (See Comments)    BLISTERS   Tapentadol Other  (See Comments)    BLISTERS   2,4-D Dimethylamine (Amisol) Rash    T-DAP   Aspirin Rash   Medications Prior to Admission  Medication Sig Dispense Refill   acetaminophen (TYLENOL) 650 MG CR tablet Take 1,300 mg by mouth in the morning and at bedtime.     citalopram (CELEXA) 40 MG tablet Take 1 tablet (40 mg total) by mouth daily. (Patient taking differently: Take 20 mg by mouth daily. ) 30 tablet 1   docusate sodium (COLACE) 100 MG capsule Take 1 capsule (100 mg total) by mouth 2 (two) times daily. (Patient taking differently: Take 100 mg by mouth 2 (two) times daily as needed for mild constipation. ) 10 capsule 0   meloxicam (MOBIC) 15 MG tablet Take 15 mg by mouth daily.     omeprazole (PRILOSEC) 40 MG capsule Take 40 mg by mouth daily.      sodium chloride (OCEAN) 0.65 % SOLN nasal spray Place 1 spray into both nostrils daily as needed for congestion (dry nose).     traMADol (ULTRAM) 50 MG tablet Take 50-100 mg by mouth 4 (four) times daily as needed for pain.     vitamin B-12 (CYANOCOBALAMIN) 1000 MCG tablet Take 1,000 mcg by mouth daily.     cyclobenzaprine (FLEXERIL) 10 MG tablet Take 1 tablet (10 mg total) by mouth 3 (three) times daily as needed for muscle spasms. (Patient not taking: Reported on 07/14/2019) 30 tablet 0    Home: Home Living Family/patient expects to be discharged to:: Private residence Living Arrangements: Alone  Functional History:   Functional Status:  Mobility:          ADL:    Cognition: Cognition Orientation Level: Oriented X4    Blood pressure (!) 126/53, pulse 67, temperature 98.5 F (36.9 C), resp. rate 17, height 5' 7.01" (1.702 m), weight 121 kg, SpO2 94 %. Physical Exam Vitals and nursing note reviewed.  Constitutional:      Appearance: She is obese.     Comments: Pt sitting up in bed- said "I'm not here" when asked if had correct pt, just finished lunch, NAD  HENT:     Head: Normocephalic and atraumatic.     Right Ear:  External ear normal.     Left Ear: External ear normal.     Nose: Nose normal. No congestion.     Mouth/Throat:     Mouth: Mucous membranes are moist.     Pharynx: Oropharynx is clear. No oropharyngeal exudate.  Eyes:     Extraocular Movements: Extraocular movements intact.  Cardiovascular:     Comments: RRR- no M/R/G- rate 80s while on monitor Pulmonary:     Comments: Good air movement- CTA B/L- wearing 2L O2 but kept taking off sats 92-95% on 2L vs off Abdominal:     Comments: Soft, NT, ND, (+)BS - very hyperactive   Musculoskeletal:     Cervical back: Neck supple. No rigidity.     Comments: UEs- 5/5 in deltoids, biceps, triceps, WE, grip and finger abd B/L  RLE_ HF 4/5, KE 2/5, KF 4-/5, DF 4+/5, PF 5-/5 LLE- HF 4/5, KE 4/5,  KF 4+/5, DF 5-/5, PF 5-/5  Skin:    Comments: Pt unwilling to let me assess incision- said turning hurt-   Neurological:     Mental Status: She is alert.     Comments: Patient is alert in no acute distress.  Follows full commands and oriented x3.  Light touch intact in all 4 extremities- but pt noted "before 2nd surgery, was "asleep" in LEs"  Psychiatric:        Mood and Affect: Mood normal.        Behavior: Behavior normal.     Results for orders placed or performed during the hospital encounter of 07/13/19 (from the past 24 hour(s))  Aerobic/Anaerobic Culture (surgical/deep wound)     Status: None (Preliminary result)   Collection Time: 07/14/19  5:08 PM   Specimen: Soft Tissue, Other  Result Value Ref Range   Specimen Description WOUND    Special Requests EPIDURAL FLUID SWAB    Gram Stain      FEW WBC PRESENT, PREDOMINANTLY PMN NO ORGANISMS SEEN Performed at [facility], [address] [place], [place] [address]    Culture PENDING    Report Status PENDING   CBC     Status: Abnormal   Collection Time: 07/15/19  4:16 AM  Result Value Ref Range   WBC 9.6 4.0 - 10.5 K/uL   RBC 3.16 (L) 3.87 - 5.11 MIL/uL   Hemoglobin 9.4 (L) 12.0 -  15.0 g/dL   HCT 30.1 (L) 36 - 46 %   MCV 95.3 80.0 - 100.0 fL   MCH 29.7 26.0 - 34.0 pg   MCHC 31.2 30.0 - 36.0 g/dL   RDW 13.2 11.5 - 15.5 %   Platelets 229 150 - 400 K/uL   nRBC 0.0 0.0 - 0.2 %  Basic metabolic panel     Status: Abnormal   Collection Time: 07/15/19  4:16 AM  Result Value Ref Range   Sodium 138 135 - 145 mmol/L   Potassium 4.2 3.5 - 5.1 mmol/L   Chloride 103 98 - 111 mmol/L   CO2 26 22 - 32 mmol/L   Glucose, Bld 99 70 - 99 mg/dL   BUN 7 (L) 8 - 23 mg/dL   Creatinine, Ser 0.65 0.44 - 1.00 mg/dL   Calcium 8.2 (L) 8.9 - 10.3 mg/dL   GFR calc non Af Amer >60 >60 mL/min   GFR calc Af Amer >60 >60 mL/min   Anion gap 9 5 - 15   MR THORACIC SPINE WO CONTRAST  Result Date: 07/13/2019 CLINICAL DATA:  Back pain.  History of recent lumbar fusion. EXAM: MRI THORACIC SPINE WITHOUT CONTRAST TECHNIQUE: Multiplanar, multisequence MR imaging of the thoracic spine was performed. No intravenous contrast was administered. COMPARISON:  None. FINDINGS: Alignment:  Normal Vertebrae: No bone lesions or fractures. No findings suspicious for discitis or osteomyelitis. Cord: Grossly normal cord signal intensity. No cord lesions or syrinx. Paraspinal and other soft tissues: No significant paraspinal or retroperitoneal findings. Disc levels: Small scattered thoracic disc protrusions but no significant disc protrusions, spinal or foraminal stenosis. IMPRESSION: 1. No significant thoracic disc protrusions, spinal or foraminal stenosis. 2. No findings suspicious for discitis or osteomyelitis. 3. Normal appearance of the thoracic spinal cord. Electronically Signed   By: Marijo Sanes M.D.   On: 07/13/2019 14:02   MR LUMBAR SPINE WO CONTRAST  Result Date: 07/13/2019 CLINICAL DATA:  History of lumbar fusion approximately 3 weeks ago. Patient with back pain. EXAM: MRI LUMBAR SPINE  WITHOUT CONTRAST TECHNIQUE: Multiplanar, multisequence MR imaging of the lumbar spine was performed. No intravenous contrast  was administered. COMPARISON:  Lumbar spine MRI 06/03/2019 FINDINGS: Segmentation: There are five lumbar type vertebral bodies. The last full intervertebral disc space is labeled L5-S1. L2-L5 fusion hardware noted. Alignment:  Normal overall alignment. Vertebrae: Significant artifact from the hardware but no obvious acute bony findings. Conus medullaris and cauda equina: Conus extends to the L1-2 level. Conus and cauda equina appear normal. Paraspinal and other soft tissues: There is a large complex fluid collection in the laminectomy bed from L2-L5. It measures approximately 6.6 x 4.0 x 4.1 cm. No significant increased T1 signal intensity to suggest this is a hematoma. There is moderate mass effect on the posterior aspect of the thecal sac and findings worrisome for abscess or CSF leak. There is also a large complex fluid collection in the subcutaneous fat measuring 13.5 cm in length. Disc levels: I do not see any findings suspicious for discitis or osteomyelitis without contrast. There is fairly significant mass effect on the thecal sac from L2-3 down to L4-5. IMPRESSION: 1. Postoperative changes with L2-L5 fusion hardware. 2. Large complex fluid collection in the laminectomy bed from L2-L5. This measures approximately 6.6 x 4.0 x 4.1 cm. There is moderate mass effect on the posterior aspect of the thecal sac and findings worrisome for abscess or CSF leak. Recommend surgical consultation. 3. Large complex subcutaneous fluid collection measuring 13.5 cm in length. 4. No findings suspicious for discitis or osteomyelitis without contrast. Electronically Signed   By: Marijo Sanes M.D.   On: 07/13/2019 14:01     Assessment/Plan: Diagnosis: s/p hematoma evacuation s/p recent L2-L5 fusion with LE weakness and C Diff (+) 1. Does the need for close, 24 hr/day medical supervision in concert with the patient's rehab needs make it unreasonable for this patient to be served in a less intensive setting?  Yes 2. Co-Morbidities requiring supervision/potential complications: C diff- although not clear if needs treatment yet, LE weakness, decreased O2 sats and need for recent O2, morbid obesity,  3. Due to bowel management, safety, skin/wound care, disease management, medication administration, pain management and patient education, does the patient require 24 hr/day rehab nursing? Yes 4. Does the patient require coordinated care of a physician, rehab nurse, therapy disciplines of PT and OT to address physical and functional deficits in the context of the above medical diagnosis(es)? Yes Addressing deficits in the following areas: balance, endurance, locomotion, strength, transferring, bathing, dressing, feeding, grooming and toileting 5. Can the patient actively participate in an intensive therapy program of at least 3 hrs of therapy per day at least 5 days per week? Yes 6. The potential for patient to make measurable gains while on inpatient rehab is good 7. Anticipated functional outcomes upon discharge from inpatient rehab are modified independent and supervision  with PT, modified independent and supervision with OT, n/a with SLP. 8. Estimated rehab length of stay to reach the above functional goals is: 10-14 days 9. Anticipated discharge destination: Home 10. Overall Rehab/Functional Prognosis: good  RECOMMENDATIONS: This patient's condition is appropriate for continued rehabilitative care in the following setting: CIR Patient has agreed to participate in recommended program. Potentially Note that insurance prior authorization may be required for reimbursement for recommended care.  Comment:  1. Pt is a good candidate for CIR< however will need to sort out C Diff status and if needs to treat PRIOR to CIR admission- since if on treatment, cannot leave her room, which  makes gait training extremely difficult.   2, Suggest weaning O2 as tolerated and flutter valve.  Might have one, but didn't see  any in room.   3. Will submit for insurance and will monitor remotely to make sure pt continues to be appropriate for CIR_ although lives alone, sister can help intermittently- pt was mod I prior.   4. Thank you for this consult.        Lavon Paganini Angiulli, PA-C 07/15/2019    I have personally performed a face to face diagnostic evaluation of this patient and formulated the key components of the plan.  Additionally, I have personally reviewed laboratory data, imaging studies, as well as relevant notes and concur with the physician assistant's documentation above.

## 2019-07-15 NOTE — Progress Notes (Signed)
Subjective: The patient is alert and pleasant.  She is in no apparent distress.  Objective: Vital signs in last 24 hours: Temp:  [97.7 F (36.5 C)-98.5 F (36.9 C)] 98.5 F (36.9 C) (07/13 0357) Pulse Rate:  [67-78] 67 (07/13 0357) Resp:  [14-19] 17 (07/13 0357) BP: (98-158)/(50-90) 126/53 (07/13 0357) SpO2:  [91 %-100 %] 94 % (07/13 0357) Weight:  [517 kg] 121 kg (07/12 1248) Estimated body mass index is 41.77 kg/m as calculated from the following:   Height as of this encounter: 5' 7.01" (1.702 m).   Weight as of this encounter: 121 kg.   Intake/Output from previous day: 07/12 0701 - 07/13 0700 In: 2408.3 [I.V.:2258.3; IV Piggyback:150] Out: 825 [Urine:600; Drains:125; Blood:100] Intake/Output this shift: No intake/output data recorded.  Physical exam the patient is alert and oriented.  She is moving her lower extremities well.  Lab Results: Recent Labs    07/14/19 0319 07/15/19 0416  WBC 9.9 9.6  HGB 10.3* 9.4*  HCT 33.2* 30.1*  PLT 271 229   BMET Recent Labs    07/14/19 0319 07/15/19 0416  NA 137 138  K 4.4 4.2  CL 102 103  CO2 28 26  GLUCOSE 128* 99  BUN 7* 7*  CREATININE 0.72 0.65  CALCIUM 8.8* 8.2*    Studies/Results: MR THORACIC SPINE WO CONTRAST  Result Date: 07/13/2019 CLINICAL DATA:  Back pain.  History of recent lumbar fusion. EXAM: MRI THORACIC SPINE WITHOUT CONTRAST TECHNIQUE: Multiplanar, multisequence MR imaging of the thoracic spine was performed. No intravenous contrast was administered. COMPARISON:  None. FINDINGS: Alignment:  Normal Vertebrae: No bone lesions or fractures. No findings suspicious for discitis or osteomyelitis. Cord: Grossly normal cord signal intensity. No cord lesions or syrinx. Paraspinal and other soft tissues: No significant paraspinal or retroperitoneal findings. Disc levels: Small scattered thoracic disc protrusions but no significant disc protrusions, spinal or foraminal stenosis. IMPRESSION: 1. No significant thoracic  disc protrusions, spinal or foraminal stenosis. 2. No findings suspicious for discitis or osteomyelitis. 3. Normal appearance of the thoracic spinal cord. Electronically Signed   By: Marijo Sanes M.D.   On: 07/13/2019 14:02   MR LUMBAR SPINE WO CONTRAST  Result Date: 07/13/2019 CLINICAL DATA:  History of lumbar fusion approximately 3 weeks ago. Patient with back pain. EXAM: MRI LUMBAR SPINE WITHOUT CONTRAST TECHNIQUE: Multiplanar, multisequence MR imaging of the lumbar spine was performed. No intravenous contrast was administered. COMPARISON:  Lumbar spine MRI 06/03/2019 FINDINGS: Segmentation: There are five lumbar type vertebral bodies. The last full intervertebral disc space is labeled L5-S1. L2-L5 fusion hardware noted. Alignment:  Normal overall alignment. Vertebrae: Significant artifact from the hardware but no obvious acute bony findings. Conus medullaris and cauda equina: Conus extends to the L1-2 level. Conus and cauda equina appear normal. Paraspinal and other soft tissues: There is a large complex fluid collection in the laminectomy bed from L2-L5. It measures approximately 6.6 x 4.0 x 4.1 cm. No significant increased T1 signal intensity to suggest this is a hematoma. There is moderate mass effect on the posterior aspect of the thecal sac and findings worrisome for abscess or CSF leak. There is also a large complex fluid collection in the subcutaneous fat measuring 13.5 cm in length. Disc levels: I do not see any findings suspicious for discitis or osteomyelitis without contrast. There is fairly significant mass effect on the thecal sac from L2-3 down to L4-5. IMPRESSION: 1. Postoperative changes with L2-L5 fusion hardware. 2. Large complex fluid collection in the laminectomy  bed from L2-L5. This measures approximately 6.6 x 4.0 x 4.1 cm. There is moderate mass effect on the posterior aspect of the thecal sac and findings worrisome for abscess or CSF leak. Recommend surgical consultation. 3. Large  complex subcutaneous fluid collection measuring 13.5 cm in length. 4. No findings suspicious for discitis or osteomyelitis without contrast. Electronically Signed   By: Marijo Sanes M.D.   On: 07/13/2019 14:01    Assessment/Plan: Postop day #1: The patient is doing well.  Her drain has put out 125 cc.  I will leave it in likely until tomorrow.  I have encouraged the patient to mobilize, ambulate and participate in therapies.  I will asked the rehab team to assess her for inpatient rehab.  C. difficile positive: Treatment per the hospitalist service.  LOS: 2 days     Ophelia Charter 07/15/2019, 7:34 AM

## 2019-07-15 NOTE — Progress Notes (Signed)
Orthopedic Tech Progress Note Patient Details:  Selena Rodgers 26-Nov-1950 [id number]  Patient ID: [name], female   DOB: 25-Jan-1950, 69 y.o.   MRN: [id number] Pt has brace  Karolee Stamps 07/15/2019, 7:15 AM

## 2019-07-15 NOTE — Anesthesia Postprocedure Evaluation (Signed)
Anesthesia Post Note  Patient: Selena Rodgers  Procedure(s) Performed: LUMBAR EXPLORATION EVACUATION OF HEMATOMA (N/A Spine Lumbar)     Patient location during evaluation: PACU Anesthesia Type: General Level of consciousness: sedated and patient cooperative Pain management: pain level controlled Vital Signs Assessment: post-procedure vital signs reviewed and stable Respiratory status: spontaneous breathing Cardiovascular status: stable Anesthetic complications: no   No complications documented.  Last Vitals:  Vitals:   07/15/19 0357 07/15/19 0739  BP: (!) 126/53 (!) 122/41  Pulse: 67 70  Resp: 17 17  Temp: 36.9 C 37.1 C  SpO2: 94% 96%    Last Pain:  Vitals:   07/15/19 0900  TempSrc:   PainSc: [place]

## 2019-07-15 NOTE — Progress Notes (Signed)
No BM today.

## 2019-07-16 MED ORDER — MELOXICAM 7.5 MG PO TABS
15.0000 mg | ORAL_TABLET | Freq: Every day | ORAL | Status: DC
  Administered 2019-07-16 – 2019-07-28 (×13): 15 mg via ORAL
  Filled 2019-07-16 (×13): qty 2

## 2019-07-16 MED ORDER — ACETAMINOPHEN 325 MG PO TABS
650.0000 mg | ORAL_TABLET | Freq: Four times a day (QID) | ORAL | Status: DC
  Administered 2019-07-16 – 2019-07-19 (×11): 650 mg via ORAL
  Filled 2019-07-16 (×13): qty 2

## 2019-07-16 NOTE — Plan of Care (Signed)

## 2019-07-16 NOTE — Progress Notes (Signed)
Inpatient Rehabilitation Admissions Coordinator  I met with patient at bedside for rehab assessment. We discussed goals and expectations of a possible CIR admit. I will begin insurance authorization for a possible CIR admit. She prefers CIR rather than SNF.  Danne Baxter, RN, MSN Rehab Admissions Coordinator [phone] 07/16/2019 2:32 PM

## 2019-07-16 NOTE — Progress Notes (Signed)
PROGRESS NOTE    Selena Rodgers   VVO:[id number]  DOB: 1950/12/15  DOA: 07/13/2019 PCP: [name], MD   Brief Narrative:  Selena Rodgers is a 69 y.o. female with medical history significant for chronic low back pain, spinal stenosis, large ventral hernia, depression/anxiety, GERD and arthritis.  She underwent laminectomy by [name] on June 23 of this year secondary to lumbar spondylolithiasis and severe spinal stenosis.  She was discharged to a SNF,  left there AMA after a few days.  She has been at home for the last 9 days.    Patient continued to have worsening weakness of the legs and back pain so came back to the emergency room.  Found to have hematoma and admitted to [facility].  She reports she has been having loose watery diarrhea for the last 3 to 4 days.  She reports she has had more than 12 episodes of watery diarrhea the day before admission.  No more bowel movement since admission.  Subjective: Patient seen and examined.  Sister at the bedside.  No overnight events.  No more bowel movement since Sunday, however reluctant to use any bowel regimen because of bad episode of diarrhea.  Remains afebrile. 2 L oxygen started overnight?  Probably has underlying undiagnosed sleep apnea. Flexeril makes her weak and difficulty ambulation, she wants to use around-the-clock Tylenol and tramadol at night.  Assessment & Plan:   Principal Problem:   Weakness and Paresthesia of right lower extremity secondary to epidural fluid collection with compression of the thecal sac, S/p laminectomy of L2-3, L3-4, L4-5 - s/p exploration and drainage of hematoma.  Clinically improving.  Working with PT OT.  Postop management as per surgery.  Adequate pain medications.  Will discontinue Flexeril.  Patient has good pain control on round-the-clock Tylenol, she wants to use Mobic and some tramadol at night.  Referred to rehab.  Active Problems:     Diarrhea - C diff antigen and PCR positive,  normal WBC count, no fever, no tenderness on exam-  - following off of medications for now - no diarrhea since being admitted to the hospital thus far   Morbid obesity  - needs to lose weight     Time spent in minutes: 25 minutes DVT prophylaxis: SCD's Start: 07/14/19 2033 SCDs Start: 07/13/19 2228  Code Status: Full code Family Communication: Sister at the bedside Disposition Plan:  Status is: Inpatient  Remains inpatient appropriate because: s/p lumbar surgery, needs rehab   Dispo: The patient is from: Home              Anticipated d/c is to: TBD              Anticipated d/c date is: 1 day              Patient currently is not medically stable to d/c.   Consultants:   NS Procedures:   I and D of lumbar wound, evacuation of hematoma Antimicrobials:  Anti-infectives (From admission, onward)   Start     Dose/Rate Route Frequency Ordered Stop   07/15/19 0200  ceFAZolin (ANCEF) IVPB 2g/100 mL premix        2 g 200 mL/hr over 30 Minutes Intravenous Every 8 hours 07/14/19 2032 07/15/19 0953   07/14/19 1703  bacitracin 50,000 Units in sodium chloride 0.9 % 500 mL irrigation  Status:  Discontinued          As needed 07/14/19 1703 07/14/19 1736  Objective: Vitals:   07/15/19 0739 07/15/19 1306 07/15/19 2109 07/16/19 0518  BP: (!) 122/41 (!) 123/59 (!) 150/61 (!) 142/55  Pulse: 70 79 72 71  Resp: 17 17 20 16   Temp: 98.7 F (37.1 C) 98.5 F (36.9 C) 98.5 F (36.9 C) 98.4 F (36.9 C)  TempSrc: Oral Oral Oral Oral  SpO2: 96% 98% 91% 94%  Weight:      Height:        Intake/Output Summary (Last 24 hours) at 07/16/2019 1117 Last data filed at 07/16/2019 0835 Gross per 24 hour  Intake 240 ml  Output 365 ml  Net -125 ml   Filed Weights   07/14/19 1248  Weight: 121 kg    Examination: Physical Exam Constitutional:      Appearance: Normal appearance.  HENT:     Head: Atraumatic.  Cardiovascular:     Rate and Rhythm: Regular rhythm.     Heart sounds:  Normal heart sounds.  Abdominal:     Palpations: Abdomen is soft.  Musculoskeletal:        General: Normal range of motion.     Comments: Dressing back with drain, not removed by me.   Neurological:     Mental Status: She is alert.      Data Reviewed: I have personally reviewed following labs and imaging studies  CBC: Recent Labs  Lab 07/13/19 1023 07/14/19 0319 07/15/19 0416  WBC 12.8* 9.9 9.6  NEUTROABS 9.1*  --   --   HGB 11.7* 10.3* 9.4*  HCT 35.2* 33.2* 30.1*  MCV 90.5 93.3 95.3  PLT 307 271 629   Basic Metabolic Panel: Recent Labs  Lab 07/13/19 1023 07/14/19 0319 07/15/19 0416  NA 133* 137 138  K 4.8 4.4 4.2  CL 99 102 103  CO2 25 28 26   GLUCOSE 133* 128* 99  BUN 14 7* 7*  CREATININE 0.72 0.72 0.65  CALCIUM 8.6* 8.8* 8.2*   GFR: Estimated Creatinine Clearance: 93.3 mL/min (by C-G formula based on SCr of 0.65 mg/dL). Liver Function Tests: Recent Labs  Lab 07/13/19 1023  AST 18  ALT 8  ALKPHOS 140*  BILITOT 0.8  PROT 7.2  ALBUMIN 3.3*   No results for input(s): LIPASE, AMYLASE in the last 168 hours. No results for input(s): AMMONIA in the last 168 hours. Coagulation Profile: No results for input(s): INR, PROTIME in the last 168 hours. Cardiac Enzymes: No results for input(s): CKTOTAL, CKMB, CKMBINDEX, TROPONINI in the last 168 hours. BNP (last 3 results) No results for input(s): PROBNP in the last 8760 hours. HbA1C: No results for input(s): HGBA1C in the last 72 hours. CBG: No results for input(s): GLUCAP in the last 168 hours. Lipid Profile: No results for input(s): CHOL, HDL, LDLCALC, TRIG, CHOLHDL, LDLDIRECT in the last 72 hours. Thyroid Function Tests: No results for input(s): TSH, T4TOTAL, FREET4, T3FREE, THYROIDAB in the last 72 hours. Anemia Panel: No results for input(s): VITAMINB12, FOLATE, FERRITIN, TIBC, IRON, RETICCTPCT in the last 72 hours. Urine analysis:    Component Value Date/Time   COLORURINE Yellow 12/27/2013 1240    APPEARANCEUR Turbid 12/27/2013 1240   LABSPEC 1.026 12/27/2013 1240   PHURINE 5.0 12/27/2013 1240   GLUCOSEU Negative 12/27/2013 1240   HGBUR 2+ 12/27/2013 1240   BILIRUBINUR Negative 12/27/2013 1240   KETONESUR Negative 12/27/2013 1240   PROTEINUR 30 mg/dL 12/27/2013 1240   NITRITE Negative 12/27/2013 1240   LEUKOCYTESUR Negative 12/27/2013 1240   Sepsis Labs: @LABRCNTIP (procalcitonin:4,lacticidven:4) ) Recent Results (from the past  240 hour(s))  SARS Coronavirus 2 by RT PCR (hospital order, performed in [facility] hospital lab) Nasopharyngeal Nasopharyngeal Swab     Status: None   Collection Time: 07/13/19 10:23 AM   Specimen: Nasopharyngeal Swab  Result Value Ref Range Status   SARS Coronavirus 2 NEGATIVE NEGATIVE Final    Comment: (NOTE) SARS-CoV-2 target nucleic acids are NOT DETECTED.  The SARS-CoV-2 RNA is generally detectable in upper and lower respiratory specimens during the acute phase of infection. The lowest concentration of SARS-CoV-2 viral copies this assay can detect is 250 copies / mL. A negative result does not preclude SARS-CoV-2 infection and should not be used as the sole basis for treatment or other patient management decisions.  A negative result may occur with improper specimen collection / handling, submission of specimen other than nasopharyngeal swab, presence of viral mutation(s) within the areas targeted by this assay, and inadequate number of viral copies (<250 copies / mL). A negative result must be combined with clinical observations, patient history, and epidemiological information.  Fact Sheet for Patients:   StrictlyIdeas.no  Fact Sheet for Healthcare Providers: BankingDealers.co.za  This test is not yet approved or  cleared by the [place] FDA and has been authorized for detection and/or diagnosis of SARS-CoV-2 by FDA under an Emergency Use Authorization (EUA).  This EUA will remain in  effect (meaning this test can be used) for the duration of the COVID-19 declaration under Section 564(b)(1) of the Act, 21 U.S.C. section 360bbb-3(b)(1), unless the authorization is terminated or revoked sooner.  Performed at [facility], [address], [place], [place] [address]   Surgical pcr screen     Status: None   Collection Time: 07/13/19 10:41 PM   Specimen: Nasal Mucosa; Nasal Swab  Result Value Ref Range Status   MRSA, PCR NEGATIVE NEGATIVE Final   Staphylococcus aureus NEGATIVE NEGATIVE Final    Comment: (NOTE) The Xpert SA Assay (FDA approved for NASAL specimens in patients 95 years of age and older), is one component of a comprehensive surveillance program. It is not intended to diagnose infection nor to guide or monitor treatment. Performed at [facility], [place] [address] [place], [place] [address]   C Difficile Quick Screen w PCR reflex     Status: Abnormal   Collection Time: 07/14/19 12:46 AM   Specimen: STOOL  Result Value Ref Range Status   C Diff antigen POSITIVE (A) NEGATIVE Final   C Diff toxin NEGATIVE NEGATIVE Final   C Diff interpretation Results are indeterminate. See PCR results.  Final    Comment: Performed at [facility], [place] [address], [place], [place] [address]  C. Diff by PCR, Reflexed     Status: Abnormal   Collection Time: 07/14/19 12:46 AM  Result Value Ref Range Status   Toxigenic C. Difficile by PCR POSITIVE (A) NEGATIVE Final    Comment: Positive for toxigenic C. difficile with little to no toxin production. Only treat if clinical presentation suggests symptomatic illness. Performed at [facility], [place] [address] [place], [place] [address]   Aerobic/Anaerobic Culture (surgical/deep wound)     Status: None (Preliminary result)   Collection Time: 07/14/19  5:08 PM   Specimen: Soft Tissue, Other  Result Value Ref Range Status   Specimen Description WOUND  Final   Special Requests EPIDURAL FLUID SWAB  Final     Gram Stain   Final    FEW WBC PRESENT, PREDOMINANTLY PMN NO ORGANISMS SEEN  Culture   Final    NO GROWTH 2 DAYS Performed at [facility], [place] [address], [place], [place] [address]    Report Status PENDING  Incomplete         Radiology Studies: No results found.    Scheduled Meds: . acetaminophen  650 mg Oral Q6H  . citalopram  20 mg Oral Daily  . docusate sodium  100 mg Oral BID  . meloxicam  15 mg Oral Daily  . pantoprazole  40 mg Oral Daily  . sodium chloride flush  3 mL Intravenous Q12H  . traMADol  100 mg Oral Q8H   Continuous Infusions: . sodium chloride       LOS: 3 days      Barb Merino, MD [facility]

## 2019-07-16 NOTE — Progress Notes (Signed)
Subjective: The patient is alert and pleasant.  She is in no apparent distress.  She wants to discontinue her muscle relaxers.  Her sister is at the bedside.  Objective: Vital signs in last 24 hours: Temp:  [98.4 F (36.9 C)-98.5 F (36.9 C)] 98.4 F (36.9 C) (07/14 0518) Pulse Rate:  [71-79] 71 (07/14 0518) Resp:  [16-20] 16 (07/14 0518) BP: (123-150)/(55-61) 142/55 (07/14 0518) SpO2:  [91 %-98 %] 94 % (07/14 0518) Estimated body mass index is 41.77 kg/m as calculated from the following:   Height as of this encounter: 5' 7.01" (1.702 m).   Weight as of this encounter: 121 kg.   Intake/Output from previous day: 07/13 0701 - 07/14 0700 In: 600 [P.O.:600] Out: 165 [Drains:165] Intake/Output this shift: No intake/output data recorded.  Physical exam the patient is alert and pleasant.  She is moving her lower extremities well.  Her drain has put out 165 cc over the last 24 hours.  Lab Results: Recent Labs    07/14/19 0319 07/15/19 0416  WBC 9.9 9.6  HGB 10.3* 9.4*  HCT 33.2* 30.1*  PLT 271 229   BMET Recent Labs    07/14/19 0319 07/15/19 0416  NA 137 138  K 4.4 4.2  CL 102 103  CO2 28 26  GLUCOSE 128* 99  BUN 7* 7*  CREATININE 0.72 0.65  CALCIUM 8.8* 8.2*    Studies/Results: No results found.  Assessment/Plan: Postop day #2: The patient is doing well.  We are awaiting rehab placement.  I have answered all their questions.  LOS: 3 days     Ophelia Charter 07/16/2019, 9:12 AM

## 2019-07-16 NOTE — Plan of Care (Signed)

## 2019-07-17 NOTE — Plan of Care (Signed)

## 2019-07-17 NOTE — Progress Notes (Signed)
Physical Therapy Treatment Patient Details Name: Selena Rodgers MRN: [id number] DOB: [birth year] Today's Date: 07/17/2019    History of Present Illness Selena Rodgers is a 69 y.o. female with medical history significant for chronic low back pain, spinal stenosis, large ventral hernia, depression/anxiety, GERD and arthritis.  She underwent laminectomy by [name] on June 23 of this year secondary to lumbar spondylolithiasis and severe spinal stenosis.  She was discharged to a SNF left there AMA after a few days.  She has been at home for the last 9 days.I and D of lumbar wound, evacuation of hematome    PT Comments    Pt motivated to participate in PT/OT session this day. Pt tolerated repeated transfer training this session, with emphasis on form and safety during standing. Pt able to stand statically only, could not progress to pivot or pre-gait stepping this day. Pt limited by RLE weakness and R patellar pain during knee extension. Pt states multiple times during session she desires d/c to CIR to "walk again", pt remains a great CIR candidate at this time. PT to continue to follow acutely.    Follow Up Recommendations  CIR     Equipment Recommendations  None recommended by PT    Recommendations for Other Services Rehab consult     Precautions / Restrictions Precautions Precautions: Fall;Back;Other (comment) (lumbar drain) Precaution Comments: reviewed spinal precautions, log roll technique in and out of bed Required Braces or Orthoses: Spinal Brace Spinal Brace: Applied in sitting position;Lumbar corset Restrictions Weight Bearing Restrictions: No    Mobility  Bed Mobility Overal bed mobility: Needs Assistance Bed Mobility: Rolling;Sidelying to Sit;Sit to Sidelying Rolling: Min assist Sidelying to sit: Mod assist;HOB elevated;+2 for physical assistance     Sit to sidelying: Mod assist;HOB elevated General bed mobility comments: Min assist for R roll for completion of  truncal translation, mod assist +1-2 for sidelying<>sit for trunk elevation/lowering, LE lifting and translation into and out of bed.  Transfers Overall transfer level: Needs assistance Equipment used: Rolling walker (2 wheeled) (recliner facing backwards for handles (bari stedy in use)) Transfers: Sit to/from Stand Sit to Stand: Mod assist;+2 physical assistance;+2 safety/equipment;From elevated surface         General transfer comment: stand x3 from elevated bed height, mod +2 for initial power up, truncal rise, steadying, and R knee extension facilitation. Pt able to stand statically only, could not progress to pivot or pre-gait stepping this day.  Ambulation/Gait             General Gait Details: unable   Stairs             Wheelchair Mobility    Modified Rankin (Stroke Patients Only)       Balance Overall balance assessment: Needs assistance Sitting-balance support: Feet supported Sitting balance-Leahy Scale: Fair     Standing balance support: Bilateral upper extremity supported;During functional activity Standing balance-Leahy Scale: Poor Standing balance comment: reliant on external support, standing tolerance ~10 seconds                            Cognition Arousal/Alertness: Awake/alert Behavior During Therapy: WFL for tasks assessed/performed Overall Cognitive Status: Within Functional Limits for tasks assessed                           Safety/Judgement: Decreased awareness of safety     General Comments: pleasant, motivated  Exercises Other Exercises Other Exercises: PT educated on ankle pumps, quad sets to perform bed-level    General Comments        Pertinent Vitals/Pain Pain Assessment: Faces Faces Pain Scale: Hurts little more Pain Location: Back, general discomfort Pain Descriptors / Indicators: Aching;Discomfort;Sore Pain Intervention(s): Limited activity within patient's tolerance;Monitored during  session;Repositioned    Home Living                      Prior Function            PT Goals (current goals can now be found in the care plan section) Acute Rehab PT Goals Patient Stated Goal: to go to rehab then home PT Goal Formulation: With patient Time For Goal Achievement: 07/29/19 Potential to Achieve Goals: Fair Progress towards PT goals: Progressing toward goals    Frequency    Min 3X/week      PT Plan Current plan remains appropriate    Co-evaluation PT/OT/SLP Co-Evaluation/Treatment: Yes Reason for Co-Treatment: For patient/therapist safety;To address functional/ADL transfers PT goals addressed during session: Mobility/safety with mobility;Balance;Strengthening/ROM        AM-PAC PT "6 Clicks" Mobility   Outcome Measure  Help needed turning from your back to your side while in a flat bed without using bedrails?: A Lot Help needed moving from lying on your back to sitting on the side of a flat bed without using bedrails?: A Lot Help needed moving to and from a bed to a chair (including a wheelchair)?: A Lot Help needed standing up from a chair using your arms (e.g., wheelchair or bedside chair)?: Total Help needed to walk in hospital room?: Total Help needed climbing 3-5 steps with a railing? : Total 6 Click Score: 9    End of Session Equipment Utilized During Treatment: Back brace;Gait belt Activity Tolerance: Patient tolerated treatment well;Patient limited by fatigue Patient left: in bed;with call bell/phone within reach;with bed alarm set;Other (comment) (on bed pan) Nurse Communication: Mobility status PT Visit Diagnosis: Other abnormalities of gait and mobility (R26.89);Muscle weakness (generalized) (M62.81)     Time: 2263-3354 PT Time Calculation (min) (ACUTE ONLY): 40 min  Charges:  $Therapeutic Activity: 8-22 mins                     Shylah Dossantos E, PT Acute Rehabilitation Services Pager [phone]  Office [phone]   Ponciano Shealy D  Elonda Husky 07/17/2019, 3:57 PM

## 2019-07-17 NOTE — Progress Notes (Signed)
Subjective: The patient is alert and pleasant.  She looks and feels better.  She feels her legs are stronger.  She is awaiting rehab placement.  Objective: Vital signs in last 24 hours: Temp:  [98.2 F (36.8 C)-98.7 F (37.1 C)] 98.2 F (36.8 C) (07/15 0815) Pulse Rate:  [69-72] 69 (07/15 0815) Resp:  [18] 18 (07/15 0815) BP: (125-140)/(45-73) 125/45 (07/15 0815) SpO2:  [90 %-97 %] 91 % (07/15 0815) Estimated body mass index is 41.77 kg/m as calculated from the following:   Height as of this encounter: 5' 7.01" (1.702 m).   Weight as of this encounter: 121 kg.   Intake/Output from previous day: 07/14 0701 - 07/15 0700 In: 220 [P.O.:220] Out: 310 [Urine:200; Drains:110] Intake/Output this shift: Total I/O In: 243 [P.O.:240; I.V.:3] Out: -   Physical exam the patient is alert and oriented.  She looks well.  She is moving her lower extremities well.  Lab Results: Recent Labs    07/15/19 0416  WBC 9.6  HGB 9.4*  HCT 30.1*  PLT 229   BMET Recent Labs    07/15/19 0416  NA 138  K 4.2  CL 103  CO2 26  GLUCOSE 99  BUN 7*  CREATININE 0.65  CALCIUM 8.2*    Studies/Results: No results found.  Assessment/Plan: Postop day #3: The patient is progressing slowly.  She is awaiting rehab placement.  We will remove her Hemovac drain.  Her cultures were negative.  LOS: 4 days     Selena Rodgers 07/17/2019, 11:53 AM

## 2019-07-17 NOTE — Progress Notes (Signed)
Hemovac removed per order.

## 2019-07-17 NOTE — Progress Notes (Addendum)
Occupational Therapy Treatment Patient Details Name: Selena Rodgers MRN: [id number] DOB: 05-May-1950 Today's Date: 07/17/2019    History of present illness Selena Rodgers is a 69 y.o. female with medical history significant for chronic low back pain, spinal stenosis, large ventral hernia, depression/anxiety, GERD and arthritis.  She underwent laminectomy by [name] on June 23 of this year secondary to lumbar spondylolithiasis and severe spinal stenosis.  She was discharged to a SNF left there AMA after a few days.  She has been at home for the last 9 days.I and D of lumbar wound, evacuation of hematome   OT comments  Pt progressing well toward stated goals this date. Session focused on sit <> stand trials to advance pt ability to engage in [place]. Pt was able to recall back precautions this session and don brace with min A. She currently requires max A to don socks/shoes while supine in bed (not yet able to advance to figure 4 method). Charlaine Dalton was not available during entirety of session, so pt used back of recliner handles to pull to stand. She currently requires heavy mod A +2 with assist for knee extension once hips are cleared from bed. Pt is able to tolerate standing ~10 seconds. She completed this x3. Pt continues to benefit from education/ reassurance from therapists regarding safety, current deficits, and progress. Recs were updated to CIR to reflect current progress. Believe pt is good candidate for intensive therapy program to progress BADLs, mobility, fall reduction and safety. Will continue to follow.   Follow Up Recommendations  CIR;Supervision/Assistance - 24 hour    Equipment Recommendations  3 in 1 bedside commode;Wheelchair (measurements OT);Wheelchair cushion (measurements OT);Hospital bed    Recommendations for Other Services      Precautions / Restrictions Precautions Precautions: Fall;Cervical Precaution Comments: reviewed spinal precautions, log roll technique in  and out of bed Required Braces or Orthoses: Spinal Brace Spinal Brace: Applied in sitting position;Lumbar corset Restrictions Weight Bearing Restrictions: No       Mobility Bed Mobility Overal bed mobility: Needs Assistance Bed Mobility: Rolling;Sidelying to Sit;Sit to Sidelying Rolling: Min assist Sidelying to sit: Mod assist;HOB elevated;+2 for physical assistance     Sit to sidelying: Mod assist;HOB elevated General bed mobility comments: Min assist for R roll for completion of truncal translation, mod assist +1-2 for sidelying<>sit for trunk elevation/lowering, LE lifting and translation into and out of bed.  Transfers Overall transfer level: Needs assistance Equipment used:  (back of recliner handles) Transfers: Sit to/from Stand Sit to Stand: Mod assist;+2 physical assistance;+2 safety/equipment;From elevated surface         General transfer comment: stand x3 from elevated bed height, mod +2 for initial power up, truncal rise, steadying, and R knee extension facilitation. Pt able to stand statically only, could not progress to pivot or pre-gait stepping this day.    Balance Overall balance assessment: Needs assistance Sitting-balance support: Feet supported Sitting balance-Leahy Scale: Fair     Standing balance support: Bilateral upper extremity supported;During functional activity Standing balance-Leahy Scale: Poor Standing balance comment: reliant on external support, standing tolerance ~10 seconds                           ADL either performed or assessed with clinical judgement   ADL Overall ADL's : Needs assistance/impaired                 Upper Body Dressing : Minimal assistance;Sitting Upper Body Dressing  Details (indicate cue type and reason): to don lumbar corset Lower Body Dressing: Maximal assistance;Bed level Lower Body Dressing Details (indicate cue type and reason): attempted supine in bed, pt requiring max A for positioning of  shoe/sock. Not yet able to tolerate figure 4 seated EOB Toilet Transfer: +2 for physical assistance;+2 for safety/equipment;Cueing for safety;Cueing for sequencing;Total assistance Toilet Transfer Details (indicate cue type and reason): pt is max A +2 to stand this date. pulling from back of recliner (stedy not available) with assist to lock knees from OT/PT. pt will require lift equipment/stedy to safely support BLEs without buckling to advance t/fs Toileting- Clothing Manipulation and Hygiene: Total assistance;Sit to/from stand;Sitting/lateral lean       Functional mobility during ADLs: Maximal assistance;+2 for physical assistance;+2 for safety/equipment (sit <> stand only x3) General ADL Comments: pt able to progress to tolerating x3 standing trials to progress toward OOB BADLs     Vision Baseline Vision/History: Wears glasses Wears Glasses: At all times Patient Visual Report: No change from baseline     Perception     Praxis      Cognition Arousal/Alertness: Awake/alert Behavior During Therapy: WFL for tasks assessed/performed Overall Cognitive Status: Within Functional Limits for tasks assessed Area of Impairment: Safety/judgement;Problem solving                         Safety/Judgement: Decreased awareness of safety;Decreased awareness of deficits   Problem Solving: Requires verbal cues General Comments: pt making jokes with therapists, very pleasand motivated to engage in session. Pt does show some increased awareness of safety/deficitis with problem solving due to her long term decision making/planning (ex. leaving SNF AMA).        Exercises Other Exercises Other Exercises: PT educated on ankle pumps, quad sets to perform bed-level   Shoulder Instructions       General Comments VSS on RA    Pertinent Vitals/ Pain       Pain Assessment: Faces Faces Pain Scale: Hurts little more Pain Location: back, R knee with extension Pain Descriptors / Indicators:  Aching;Discomfort;Sore Pain Intervention(s): Limited activity within patient's tolerance;Monitored during session;Repositioned  Home Living                                          Prior Functioning/Environment              Frequency           Progress Toward Goals  OT Goals(current goals can now be found in the care plan section)  Progress towards OT goals: Progressing toward goals  Acute Rehab OT Goals Patient Stated Goal: to go to rehab then home OT Goal Formulation: With patient Time For Goal Achievement: 07/29/19 Potential to Achieve Goals: Good  Plan Discharge plan remains appropriate;Frequency remains appropriate    Co-evaluation      Reason for Co-Treatment: For patient/therapist safety;To address functional/ADL transfers PT goals addressed during session: Mobility/safety with mobility;Balance;Strengthening/ROM OT goals addressed during session: ADL's and self-care;Proper use of Adaptive equipment and DME      AM-PAC OT "6 Clicks" Daily Activity     Outcome Measure   Help from another person eating meals?: A Little Help from another person taking care of personal grooming?: A Little Help from another person toileting, which includes using toliet, bedpan, or urinal?: Total Help from another person bathing (including washing, rinsing,  drying)?: A Lot Help from another person to put on and taking off regular upper body clothing?: A Lot Help from another person to put on and taking off regular lower body clothing?: A Lot 6 Click Score: 13    End of Session Equipment Utilized During Treatment: Gait belt;Back brace  OT Visit Diagnosis: Unsteadiness on feet (R26.81);Other abnormalities of gait and mobility (R26.89);Muscle weakness (generalized) (M62.81);Repeated falls (R29.6);Pain Pain - Right/Left: Left Pain - part of body: Knee (back)   Activity Tolerance Patient tolerated treatment well   Patient Left in bed;with call bell/phone  within reach;with bed alarm set;Other (comment) (on bed pan)   Nurse Communication Mobility status (pt on bed pan)        Time: 2778-2423 OT Time Calculation (min): 38 min  Charges: OT General Charges $OT Visit: 1 Visit OT Treatments $Self Care/Home Management : 23-37 mins  Zenovia Jarred, MSOT, OTR/L [facility] Office Number: [phone] Pager: [phone]  Zenovia Jarred 07/17/2019, 4:29 PM

## 2019-07-17 NOTE — Progress Notes (Signed)
PROGRESS NOTE    Selena Rodgers   JSH:[id number]  DOB: Sep 16, 1950  DOA: 07/13/2019 PCP: [name], MD   Brief Narrative:  Selena Rodgers is a 69 y.o. female with medical history significant for chronic low back pain, spinal stenosis, large ventral hernia, depression/anxiety, GERD and arthritis.  She underwent laminectomy by [name] on June 23 of this year secondary to lumbar spondylolithiasis and severe spinal stenosis.  She was discharged to a SNF,  left there AMA after a few days.  She has been at home for the last 9 days.    Patient continued to have worsening weakness of the legs and back pain so came back to the emergency room.  Found to have hematoma and admitted to [facility].  She reports she has been having loose watery diarrhea for the last 3 to 4 days.  She reports she has had more than 12 episodes of watery diarrhea the day before admission.  No more bowel movement since admission.  Subjective: Patient was seen and examined.  He still has difficulty walking and bearing weight, however she is very highly motivated and she thinks her legs are getting stronger.  She was able to lift up her right leg and was very happy about it. No bowel movement for the last 24 hours.  Assessment & Plan:   Principal Problem:   Weakness and Paresthesia of right lower extremity secondary to epidural fluid collection with compression of the thecal sac, S/p laminectomy of L2-3, L3-4, L4-5 - s/p exploration and drainage of hematoma.  Clinically improving.  Working with PT OT.  Postop management as per surgery.  Adequate pain medications.   Pain well controlled on scheduled Tylenol, Mobic and tramadol at night. Waiting for acute inpatient rehab bed availability.  Active Problems:     Diarrhea - C diff antigen and PCR positive, normal WBC count, no fever, no tenderness on exam-  - following off of medications for now - no diarrhea since being admitted to the hospital thus far    Morbid obesity  - needs to lose weight     Time spent in minutes: 25 minutes DVT prophylaxis: SCD's Start: 07/14/19 2033 SCDs Start: 07/13/19 2228  Code Status: Full code Family Communication: None today. Disposition Plan:  Status is: Inpatient  Remains inpatient appropriate because: s/p lumbar surgery, needs rehab   Dispo: The patient is from: Home              Anticipated d/c is to: TBD              Anticipated d/c date is: 1 day              Patient currently is not medically stable to d/c.   Consultants:   NS Procedures:   I and D of lumbar wound, evacuation of hematoma, negative cultures Antimicrobials:  Anti-infectives (From admission, onward)   Start     Dose/Rate Route Frequency Ordered Stop   07/15/19 0200  ceFAZolin (ANCEF) IVPB 2g/100 mL premix        2 g 200 mL/hr over 30 Minutes Intravenous Every 8 hours 07/14/19 2032 07/15/19 0953   07/14/19 1703  bacitracin 50,000 Units in sodium chloride 0.9 % 500 mL irrigation  Status:  Discontinued          As needed 07/14/19 1703 07/14/19 1736       Objective: Vitals:   07/16/19 1209 07/16/19 1925 07/17/19 0317 07/17/19 0815  BP: 138/61 133/73 140/68 Marland Kitchen)  125/45  Pulse: 70 70 72 69  Resp: 18 18 18 18   Temp: 98.6 F (37 C) 98.7 F (37.1 C) 98.5 F (36.9 C) 98.2 F (36.8 C)  TempSrc: Oral Oral Oral Oral  SpO2: 93% 97% 90% 91%  Weight:      Height:        Intake/Output Summary (Last 24 hours) at 07/17/2019 1416 Last data filed at 07/17/2019 1100 Gross per 24 hour  Intake 463 ml  Output 70 ml  Net 393 ml   Filed Weights   07/14/19 1248  Weight: 121 kg    Examination: Physical Exam Constitutional:      Appearance: Normal appearance.  HENT:     Head: Atraumatic.  Cardiovascular:     Rate and Rhythm: Regular rhythm.     Heart sounds: Normal heart sounds.  Abdominal:     Palpations: Abdomen is soft.  Musculoskeletal:        General: Normal range of motion.     Comments: Dressing back with  drain, not removed by me.   Neurological:     Mental Status: She is alert.    Moves all extremities.  Both legs are generalized weakness.  Able to lift up against gravity.  Data Reviewed: I have personally reviewed following labs and imaging studies  CBC: Recent Labs  Lab 07/13/19 1023 07/14/19 0319 07/15/19 0416  WBC 12.8* 9.9 9.6  NEUTROABS 9.1*  --   --   HGB 11.7* 10.3* 9.4*  HCT 35.2* 33.2* 30.1*  MCV 90.5 93.3 95.3  PLT 307 271 784   Basic Metabolic Panel: Recent Labs  Lab 07/13/19 1023 07/14/19 0319 07/15/19 0416  NA 133* 137 138  K 4.8 4.4 4.2  CL 99 102 103  CO2 25 28 26   GLUCOSE 133* 128* 99  BUN 14 7* 7*  CREATININE 0.72 0.72 0.65  CALCIUM 8.6* 8.8* 8.2*   GFR: Estimated Creatinine Clearance: 93.3 mL/min (by C-G formula based on SCr of 0.65 mg/dL). Liver Function Tests: Recent Labs  Lab 07/13/19 1023  AST 18  ALT 8  ALKPHOS 140*  BILITOT 0.8  PROT 7.2  ALBUMIN 3.3*   No results for input(s): LIPASE, AMYLASE in the last 168 hours. No results for input(s): AMMONIA in the last 168 hours. Coagulation Profile: No results for input(s): INR, PROTIME in the last 168 hours. Cardiac Enzymes: No results for input(s): CKTOTAL, CKMB, CKMBINDEX, TROPONINI in the last 168 hours. BNP (last 3 results) No results for input(s): PROBNP in the last 8760 hours. HbA1C: No results for input(s): HGBA1C in the last 72 hours. CBG: No results for input(s): GLUCAP in the last 168 hours. Lipid Profile: No results for input(s): CHOL, HDL, LDLCALC, TRIG, CHOLHDL, LDLDIRECT in the last 72 hours. Thyroid Function Tests: No results for input(s): TSH, T4TOTAL, FREET4, T3FREE, THYROIDAB in the last 72 hours. Anemia Panel: No results for input(s): VITAMINB12, FOLATE, FERRITIN, TIBC, IRON, RETICCTPCT in the last 72 hours. Urine analysis:    Component Value Date/Time   COLORURINE Yellow 12/27/2013 1240   APPEARANCEUR Turbid 12/27/2013 1240   LABSPEC 1.026 12/27/2013 1240     PHURINE 5.0 12/27/2013 1240   GLUCOSEU Negative 12/27/2013 1240   HGBUR 2+ 12/27/2013 1240   BILIRUBINUR Negative 12/27/2013 1240   KETONESUR Negative 12/27/2013 1240   PROTEINUR 30 mg/dL 12/27/2013 1240   NITRITE Negative 12/27/2013 1240   LEUKOCYTESUR Negative 12/27/2013 1240   Sepsis Labs: @LABRCNTIP (procalcitonin:4,lacticidven:4) ) Recent Results (from the past 240 hour(s))  SARS Coronavirus  2 by RT PCR (hospital order, performed in [facility] hospital lab) Nasopharyngeal Nasopharyngeal Swab     Status: None   Collection Time: 07/13/19 10:23 AM   Specimen: Nasopharyngeal Swab  Result Value Ref Range Status   SARS Coronavirus 2 NEGATIVE NEGATIVE Final    Comment: (NOTE) SARS-CoV-2 target nucleic acids are NOT DETECTED.  The SARS-CoV-2 RNA is generally detectable in upper and lower respiratory specimens during the acute phase of infection. The lowest concentration of SARS-CoV-2 viral copies this assay can detect is 250 copies / mL. A negative result does not preclude SARS-CoV-2 infection and should not be used as the sole basis for treatment or other patient management decisions.  A negative result may occur with improper specimen collection / handling, submission of specimen other than nasopharyngeal swab, presence of viral mutation(s) within the areas targeted by this assay, and inadequate number of viral copies (<250 copies / mL). A negative result must be combined with clinical observations, patient history, and epidemiological information.  Fact Sheet for Patients:   StrictlyIdeas.no  Fact Sheet for Healthcare Providers: BankingDealers.co.za  This test is not yet approved or  cleared by the [place] FDA and has been authorized for detection and/or diagnosis of SARS-CoV-2 by FDA under an Emergency Use Authorization (EUA).  This EUA will remain in effect (meaning this test can be used) for the duration of  the COVID-19 declaration under Section 564(b)(1) of the Act, 21 U.S.C. section 360bbb-3(b)(1), unless the authorization is terminated or revoked sooner.  Performed at [facility], [address] [place], [place] [address]   Surgical pcr screen     Status: None   Collection Time: 07/13/19 10:41 PM   Specimen: Nasal Mucosa; Nasal Swab  Result Value Ref Range Status   MRSA, PCR NEGATIVE NEGATIVE Final   Staphylococcus aureus NEGATIVE NEGATIVE Final    Comment: (NOTE) The Xpert SA Assay (FDA approved for NASAL specimens in patients 21 years of age and older), is one component of a comprehensive surveillance program. It is not intended to diagnose infection nor to guide or monitor treatment. Performed at [facility], [place] [address] [place], [place] [address]   C Difficile Quick Screen w PCR reflex     Status: Abnormal   Collection Time: 07/14/19 12:46 AM   Specimen: STOOL  Result Value Ref Range Status   C Diff antigen POSITIVE (A) NEGATIVE Final   C Diff toxin NEGATIVE NEGATIVE Final   C Diff interpretation Results are indeterminate. See PCR results.  Final    Comment: Performed at [facility], [place] [address] [place], [place] [address]  C. Diff by PCR, Reflexed     Status: Abnormal   Collection Time: 07/14/19 12:46 AM  Result Value Ref Range Status   Toxigenic C. Difficile by PCR POSITIVE (A) NEGATIVE Final    Comment: Positive for toxigenic C. difficile with little to no toxin production. Only treat if clinical presentation suggests symptomatic illness. Performed at [facility], [place] [address] [place], [place] [address]   Aerobic/Anaerobic Culture (surgical/deep wound)     Status: None (Preliminary result)   Collection Time: 07/14/19  5:08 PM   Specimen: Soft Tissue, Other  Result Value Ref Range Status   Specimen Description WOUND  Final   Special Requests EPIDURAL FLUID SWAB  Final   Gram Stain   Final    FEW WBC PRESENT, PREDOMINANTLY  PMN NO ORGANISMS SEEN    Culture   Final  NO GROWTH 3 DAYS NO ANAEROBES ISOLATED; CULTURE IN PROGRESS FOR 5 DAYS Performed at [facility], [place] [address], [place], [place] [address]    Report Status PENDING  Incomplete         Radiology Studies: No results found.    Scheduled Meds: . acetaminophen  650 mg Oral Q6H  . citalopram  20 mg Oral Daily  . docusate sodium  100 mg Oral BID  . meloxicam  15 mg Oral Daily  . pantoprazole  40 mg Oral Daily  . sodium chloride flush  3 mL Intravenous Q12H  . traMADol  100 mg Oral Q8H   Continuous Infusions: . sodium chloride       LOS: 4 days   Total time spent: 25 minutes   Barb Merino, MD [facility]

## 2019-07-18 NOTE — Progress Notes (Signed)
Inpatient Rehabilitation Admissions Coordinator  Per Dr. Sloan Leiter, patient has received a denial form [employer] after his peer to peer. I spoke with patient and she is aware that SNF rehab will be needed. I have contacted Levada Dy with TOC and we will sign off at this time.  Danne Baxter, RN, MSN Rehab Admissions Coordinator [phone] 07/18/2019 1:09 PM

## 2019-07-18 NOTE — TOC Progression Note (Addendum)
Transition of Care [facility]) - Progression Note    Patient Details  Name: Selena Rodgers MRN: [id number] Date of Birth: August 08, 1950  Transition of Care [facility]) CM/SW Contact  Sharin Mons, RN Phone Number: 07/18/2019, 1:17 PM  Clinical Narrative:    [place] Medicare denied CIR.  Pt is aware that she needs SNF is agreeable. Medicare star rating list provided to pt . Pt's first choice [employer] NCM discussed insurance authorization process .Patient expressed being hopeful for rehab and to feel better soon. No further questions reported at this time. NCM to continue to follow and assist with discharge planning needs. Pt states  has not had COVID vaccine per the advisement of her PCP 2/2 to her allergies.     Expected Discharge Plan: Skilled Nursing Facility Barriers to Discharge: Insurance Authorization, No SNF bed  Expected Discharge Plan and Services Expected Discharge Plan: [place]                                               Social Determinants of Health (SDOH) Interventions    Readmission Risk Interventions No flowsheet data found.

## 2019-07-18 NOTE — NC FL2 (Signed)
[place] LEVEL OF CARE SCREENING TOOL     IDENTIFICATION  Patient Name: Selena Rodgers Birthdate: Dec 08, 1950 Sex: female Admission Date (Current Location): 07/13/2019  [facility] and [place] Number:  Engineering geologist and Address:  [facility], [place] [address], [place], [place] [address]      Provider Number: [id number]  Attending Physician Name and Address:  Barb Merino, MD  Relative Name and Phone Number:       Current Level of Care: Hospital Recommended Level of Care: [place] Prior Approval Number:    Date Approved/Denied:   PASRR Number: [id number]  Discharge Plan: SNF    Current Diagnoses: Patient Active Problem List   Diagnosis Date Noted  . Lumbar surgical wound fluid collection 07/13/2019  . Right leg weakness 07/13/2019  . Diarrhea 07/13/2019  . Paresthesia of lower extremity 07/13/2019  . Paresthesia of lower limb 07/13/2019  . Spondylolisthesis, lumbar region 06/25/2019  . Adjustment disorder with mixed anxiety and depressed mood   . Slow transit constipation   . Myelopathy ([place]) 04/21/2015  . Cervical myelopathy ([place])   . Constipation due to pain medication   . Depression   . Primary osteoarthritis of right shoulder   . S/P spinal surgery   . Abnormality of gait   . Weakness   . Other secondary hypertension   . Post-operative pain   . Arthritis   . Cervical spondylosis with myelopathy 04/19/2015  . 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 Ventral hernia 10/07/2014  . Current tobacco use 10/06/2014  . Anxiety 09/17/2014  . Back pain, chronic 09/17/2014  . Clinical depression 09/17/2014  . BP (high blood pressure) 09/17/2014  . Borderline diabetes 09/17/2014  . Arthritis, degenerative 09/17/2014  . Spinal stenosis 09/17/2014  . Compulsive tobacco user syndrome 09/17/2014  . Abdominal mass 06/30/2014  . Incisional hernia, without obstruction or gangrene 06/30/2014  . Morbid obesity ([place])  06/30/2014  . Hypercholesteremia 05/23/2013  . 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 Hypercholesterolemia 05/23/2013    Orientation RESPIRATION BLADDER Height & Weight     Self, Time, Situation, Place  Normal Continent Weight: 121 kg Height:  5' 7.01" (170.2 cm)  BEHAVIORAL SYMPTOMS/MOOD NEUROLOGICAL BOWEL NUTRITION STATUS      Continent    AMBULATORY STATUS COMMUNICATION OF NEEDS Skin   Extensive Assist Verbally Normal                       Personal Care Assistance Level of Assistance  Bathing, Feeding, Dressing Bathing Assistance: Maximum assistance Feeding assistance: Independent Dressing Assistance: Maximum assistance     Functional Limitations Info  Sight, Hearing, Speech Sight Info: Adequate Hearing Info: Adequate Speech Info: Adequate    SPECIAL CARE FACTORS FREQUENCY        PT Frequency: 5x/week, evaluate and treat OT Frequency: 5x/week, evaluate and treat            Contractures Contractures Info: Not present    Additional Factors Info  Code Status, Allergies, Psychotropic Code Status Info: Full Code Allergies Info: Chlorpheniramine, Linseed Oil, Codeine, Flax Seed Oil Bio-flax, Latex, Tape, Tapentadol, 2,4-d Dimethylamine (Amisol), Aspirin Psychotropic Info: Celexa 20mg  daily         Current Medications (07/18/2019):  This is the current hospital active medication list Current Facility-Administered Medications  Medication Dose Route Frequency Provider Last Rate Last Admin  . 0.9 %  sodium chloride infusion  250 mL Intravenous Continuous Newman Pies, MD      .  acetaminophen (TYLENOL) tablet 650 mg  650 mg Oral Q6H Barb Merino, MD   650 mg at 07/18/19 1327  . albuterol (PROVENTIL) (2.5 MG/3ML) 0.083% nebulizer solution 2.5 mg  2.5 mg Nebulization Q2H PRN Newman Pies, MD      . bisacodyl (DULCOLAX) suppository 10 mg  10 mg Rectal Daily PRN Newman Pies, MD      . citalopram (CELEXA) tablet 20 mg  20 mg Oral Daily Newman Pies, MD   20 mg at 07/18/19 0919  . docusate sodium (COLACE) capsule 100 mg  100 mg Oral BID Newman Pies, MD   100 mg at 07/17/19 2107  . meloxicam (MOBIC) tablet 15 mg  15 mg Oral Daily Barb Merino, MD   15 mg at 07/18/19 0919  . menthol-cetylpyridinium (CEPACOL) lozenge 3 mg  1 lozenge Oral PRN Newman Pies, MD       Or  . phenol (CHLORASEPTIC) mouth spray 1 spray  1 spray Mouth/Throat PRN Newman Pies, MD      . morphine 4 MG/ML injection 4 mg  4 mg Intravenous Q2H PRN Newman Pies, MD      . ondansetron [facility]) tablet 4 mg  4 mg Oral Q6H PRN Newman Pies, MD       Or  . ondansetron [facility]) injection 4 mg  4 mg Intravenous Q6H PRN Newman Pies, MD      . oxyCODONE (Oxy IR/ROXICODONE) immediate release tablet 10 mg  10 mg Oral Q3H PRN Newman Pies, MD   10 mg at 07/15/19 2053  . oxyCODONE (Oxy IR/ROXICODONE) immediate release tablet 5 mg  5 mg Oral Q3H PRN Newman Pies, MD      . pantoprazole (PROTONIX) EC tablet 40 mg  40 mg Oral Daily Newman Pies, MD   40 mg at 07/18/19 0919  . promethazine (PHENERGAN) tablet 12.5 mg  12.5 mg Oral Q6H PRN Newman Pies, MD      . sodium chloride flush (NS) 0.9 % injection 3 mL  3 mL Intravenous Q12H Newman Pies, MD   3 mL at 07/17/19 2107  . sodium chloride flush (NS) 0.9 % injection 3 mL  3 mL Intravenous PRN Newman Pies, MD      . traMADol Veatrice Bourbon) tablet 100 mg  100 mg Oral Q8H Newman Pies, MD   100 mg at 07/18/19 0631  . zolpidem (AMBIEN) tablet 5 mg  5 mg Oral QHS PRN Newman Pies, MD         Discharge Medications: Please see discharge summary for a list of discharge medications.  Relevant Imaging Results:  Relevant Lab Results:   Additional Information SS#: [id number]  Sharin Mons, RN

## 2019-07-18 NOTE — Care Management (Addendum)
Called [place] medicare physician reviewer for CIR admission.  Was able to talk to representative after 4 attempts.  Physician to call me back , waiting for call back.   Received a call back, strongly recommended that patient is rehab suitable candidate, insurance physician recommended SNF level of therapy.

## 2019-07-18 NOTE — TOC Progression Note (Addendum)
Transition of Care [facility]) - Progression Note    Patient Details  Name: Selena Rodgers MRN: [id number] Date of Birth: 1950-12-07  Transition of [facility]) CM/SW Contact  Sharin Mons, RN Phone Number: 07/18/2019, 10:14 AM  Clinical Narrative:    Pt with recent admit, laminectomy by [name] on June 23. Discharged to [facility] and left AMA 2/2 care given.  Pt with hx of chronic low back pain, spinal stenosis, large ventral hernia, depression/anxiety, GERD and arthritis.    Presents with worsening weakness of the legs and back pain.  From home with sister. States sister works during the day but is able to leave work to assist with needs if needed. States sister's job is close to home.  ONETTA SPAINHOWER (Sister)     [phone]           Noted PT's recommendation for CIR, pt agreeable. Insurance authorization pending.   TOC team will continue to monitor and follow for needs ....  Expected Discharge Plan: IP Rehab Facility Barriers to Discharge: Continued Medical Work up, Orthoptist and Services Expected Discharge Plan: [place]                                               Social Determinants of Health (SDOH) Interventions    Readmission Risk Interventions No flowsheet data found.

## 2019-07-18 NOTE — Progress Notes (Signed)
PROGRESS NOTE    Selena Rodgers   IRC:[id number]  DOB: Nov 09, 1950  DOA: 07/13/2019 PCP: [name], MD   Brief Narrative:  Selena Rodgers is a 69 y.o. female with medical history significant for chronic low back pain, spinal stenosis, large ventral hernia, depression/anxiety, GERD and arthritis.  She underwent laminectomy by [name] on June 23 of this year secondary to lumbar spondylolithiasis and severe spinal stenosis.  She was discharged to a SNF,  left there AMA after a few days.  She has been at home for the last 9 days.    Patient continued to have worsening weakness of the legs and back pain so came back to the emergency room.  Found to have hematoma and admitted to [facility].  She reports she has been having loose watery diarrhea for the last 3 to 4 days.  She reports she has had more than 12 episodes of watery diarrhea the day before admission.  No more bowel movement since admission.  Subjective: Patient seen and examined.  No overnight events.  No bowel movement yet.  Pain is well controlled.  Very motivated to work with therapies.  Assessment & Plan:   Principal Problem:   Weakness and Paresthesia of right lower extremity secondary to epidural fluid collection with compression of the thecal sac, S/p laminectomy of L2-3, L3-4, L4-5 - s/p exploration and drainage of hematoma.  Clinically improving.  Working with PT OT.  Postop management as per surgery.  Adequate pain medications.   Pain well controlled on scheduled Tylenol, Mobic and tramadol at night. Waiting for acute inpatient rehab bed availability.  Active Problems:     Diarrhea - C diff antigen and PCR positive, normal WBC count, no fever, no tenderness on exam-  - following off of medications for now - no diarrhea since being admitted to the hospital thus far - Start bowel regimen now.   Morbid obesity  - needs to lose weight   Very large ventral hernia -Uncomplicated.  Irreducible.  Time  spent in minutes: 25 minutes DVT prophylaxis: SCD's Start: 07/14/19 2033 SCDs Start: 07/13/19 2228  Code Status: Full code Family Communication: None today. Disposition Plan:  Status is: Inpatient  Remains inpatient appropriate because: s/p lumbar surgery, needs rehab   Dispo: The patient is from: Home              Anticipated d/c is to: Acute inpatient rehab.              Anticipated d/c date is: When insurance approval /bed available.              Patient currently is medically stable for discharge.   Consultants:   NS Procedures:   I and D of lumbar wound, evacuation of hematoma, negative cultures Antimicrobials:  Anti-infectives (From admission, onward)   Start     Dose/Rate Route Frequency Ordered Stop   07/15/19 0200  ceFAZolin (ANCEF) IVPB 2g/100 mL premix        2 g 200 mL/hr over 30 Minutes Intravenous Every 8 hours 07/14/19 2032 07/15/19 0953   07/14/19 1703  bacitracin 50,000 Units in sodium chloride 0.9 % 500 mL irrigation  Status:  Discontinued          As needed 07/14/19 1703 07/14/19 1736       Objective: Vitals:   07/17/19 1948 07/17/19 2018 07/18/19 0423 07/18/19 0733  BP: 130/80 (!) 163/60 (!) 170/82 139/66  Pulse: 82 92 82 70  Resp: 18  18 16 15   Temp: 98.5 F (36.9 C) 98.3 F (36.8 C) 98.6 F (37 C) 98.4 F (36.9 C)  TempSrc: Oral Oral Oral Oral  SpO2: 93% 91% 92% 91%  Weight:      Height:        Intake/Output Summary (Last 24 hours) at 07/18/2019 1002 Last data filed at 07/17/2019 1700 Gross per 24 hour  Intake 720 ml  Output --  Net 720 ml   Filed Weights   07/14/19 1248  Weight: 121 kg    Examination: Physical Exam Constitutional:      Appearance: Normal appearance.  HENT:     Head: Atraumatic.  Cardiovascular:     Rate and Rhythm: Regular rhythm.     Heart sounds: Normal heart sounds.  Abdominal:     Palpations: Abdomen is soft.     Comments: Very large reducible ventral hernia present.  Musculoskeletal:         General: Normal range of motion.     Comments: Dry dressing on the back.  Nontender.  Neurological:     Mental Status: She is alert.    Moves all extremities.  Both legs are generalized weakness.  Able to lift up against gravity.  Data Reviewed: I have personally reviewed following labs and imaging studies  CBC: Recent Labs  Lab 07/13/19 1023 07/14/19 0319 07/15/19 0416  WBC 12.8* 9.9 9.6  NEUTROABS 9.1*  --   --   HGB 11.7* 10.3* 9.4*  HCT 35.2* 33.2* 30.1*  MCV 90.5 93.3 95.3  PLT 307 271 465   Basic Metabolic Panel: Recent Labs  Lab 07/13/19 1023 07/14/19 0319 07/15/19 0416  NA 133* 137 138  K 4.8 4.4 4.2  CL 99 102 103  CO2 25 28 26   GLUCOSE 133* 128* 99  BUN 14 7* 7*  CREATININE 0.72 0.72 0.65  CALCIUM 8.6* 8.8* 8.2*   GFR: Estimated Creatinine Clearance: 93.3 mL/min (by C-G formula based on SCr of 0.65 mg/dL). Liver Function Tests: Recent Labs  Lab 07/13/19 1023  AST 18  ALT 8  ALKPHOS 140*  BILITOT 0.8  PROT 7.2  ALBUMIN 3.3*   No results for input(s): LIPASE, AMYLASE in the last 168 hours. No results for input(s): AMMONIA in the last 168 hours. Coagulation Profile: No results for input(s): INR, PROTIME in the last 168 hours. Cardiac Enzymes: No results for input(s): CKTOTAL, CKMB, CKMBINDEX, TROPONINI in the last 168 hours. BNP (last 3 results) No results for input(s): PROBNP in the last 8760 hours. HbA1C: No results for input(s): HGBA1C in the last 72 hours. CBG: No results for input(s): GLUCAP in the last 168 hours. Lipid Profile: No results for input(s): CHOL, HDL, LDLCALC, TRIG, CHOLHDL, LDLDIRECT in the last 72 hours. Thyroid Function Tests: No results for input(s): TSH, T4TOTAL, FREET4, T3FREE, THYROIDAB in the last 72 hours. Anemia Panel: No results for input(s): VITAMINB12, FOLATE, FERRITIN, TIBC, IRON, RETICCTPCT in the last 72 hours. Urine analysis:    Component Value Date/Time   COLORURINE Yellow 12/27/2013 1240    APPEARANCEUR Turbid 12/27/2013 1240   LABSPEC 1.026 12/27/2013 1240   PHURINE 5.0 12/27/2013 1240   GLUCOSEU Negative 12/27/2013 1240   HGBUR 2+ 12/27/2013 1240   BILIRUBINUR Negative 12/27/2013 1240   KETONESUR Negative 12/27/2013 1240   PROTEINUR 30 mg/dL 12/27/2013 1240   NITRITE Negative 12/27/2013 1240   LEUKOCYTESUR Negative 12/27/2013 1240   Sepsis Labs: @LABRCNTIP (procalcitonin:4,lacticidven:4) ) Recent Results (from the past 240 hour(s))  SARS Coronavirus 2 by RT  PCR (hospital order, performed in [facility] lab) Nasopharyngeal Nasopharyngeal Swab     Status: None   Collection Time: 07/13/19 10:23 AM   Specimen: Nasopharyngeal Swab  Result Value Ref Range Status   SARS Coronavirus 2 NEGATIVE NEGATIVE Final    Comment: (NOTE) SARS-CoV-2 target nucleic acids are NOT DETECTED.  The SARS-CoV-2 RNA is generally detectable in upper and lower respiratory specimens during the acute phase of infection. The lowest concentration of SARS-CoV-2 viral copies this assay can detect is 250 copies / mL. A negative result does not preclude SARS-CoV-2 infection and should not be used as the sole basis for treatment or other patient management decisions.  A negative result may occur with improper specimen collection / handling, submission of specimen other than nasopharyngeal swab, presence of viral mutation(s) within the areas targeted by this assay, and inadequate number of viral copies (<250 copies / mL). A negative result must be combined with clinical observations, patient history, and epidemiological information.  Fact Sheet for Patients:   StrictlyIdeas.no  Fact Sheet for Healthcare Providers: BankingDealers.co.za  This test is not yet approved or  cleared by the [place] FDA and has been authorized for detection and/or diagnosis of SARS-CoV-2 by FDA under an Emergency Use Authorization (EUA).  This EUA will remain in  effect (meaning this test can be used) for the duration of the COVID-19 declaration under Section 564(b)(1) of the Act, 21 U.S.C. section 360bbb-3(b)(1), unless the authorization is terminated or revoked sooner.  Performed at [facility], [address], [place], [place] [address]   Surgical pcr screen     Status: None   Collection Time: 07/13/19 10:41 PM   Specimen: Nasal Mucosa; Nasal Swab  Result Value Ref Range Status   MRSA, PCR NEGATIVE NEGATIVE Final   Staphylococcus aureus NEGATIVE NEGATIVE Final    Comment: (NOTE) The Xpert SA Assay (FDA approved for NASAL specimens in patients 73 years of age and older), is one component of a comprehensive surveillance program. It is not intended to diagnose infection nor to guide or monitor treatment. Performed at [facility], [place] [address] [place], [place] [address]   C Difficile Quick Screen w PCR reflex     Status: Abnormal   Collection Time: 07/14/19 12:46 AM   Specimen: STOOL  Result Value Ref Range Status   C Diff antigen POSITIVE (A) NEGATIVE Final   C Diff toxin NEGATIVE NEGATIVE Final   C Diff interpretation Results are indeterminate. See PCR results.  Final    Comment: Performed at [facility], [place] [address] [place], [place] [address]  C. Diff by PCR, Reflexed     Status: Abnormal   Collection Time: 07/14/19 12:46 AM  Result Value Ref Range Status   Toxigenic C. Difficile by PCR POSITIVE (A) NEGATIVE Final    Comment: Positive for toxigenic C. difficile with little to no toxin production. Only treat if clinical presentation suggests symptomatic illness. Performed at [facility], [place] [address], [place], [place] [address]   Aerobic/Anaerobic Culture (surgical/deep wound)     Status: None (Preliminary result)   Collection Time: 07/14/19  5:08 PM   Specimen: Soft Tissue, Other  Result Value Ref Range Status   Specimen Description WOUND  Final   Special Requests EPIDURAL FLUID SWAB  Final    Gram Stain   Final    FEW WBC PRESENT, PREDOMINANTLY PMN NO ORGANISMS SEEN    Culture   Final  NO GROWTH 3 DAYS NO ANAEROBES ISOLATED; CULTURE IN PROGRESS FOR 5 DAYS Performed at [facility], [place] [address], [place], [place] [address]    Report Status PENDING  Incomplete         Radiology Studies: No results found.    Scheduled Meds: . acetaminophen  650 mg Oral Q6H  . citalopram  20 mg Oral Daily  . docusate sodium  100 mg Oral BID  . meloxicam  15 mg Oral Daily  . pantoprazole  40 mg Oral Daily  . sodium chloride flush  3 mL Intravenous Q12H  . traMADol  100 mg Oral Q8H   Continuous Infusions: . sodium chloride       LOS: 5 days   Total time spent: 25 minutes   Barb Merino, MD [facility]

## 2019-07-18 NOTE — Plan of Care (Signed)
  Problem: Activity: Goal: Risk for activity intolerance will decrease Outcome: Progressing   Problem: Pain Managment: Goal: General experience of comfort will improve Outcome: Progressing   

## 2019-07-19 LAB — AEROBIC/ANAEROBIC CULTURE W GRAM STAIN (SURGICAL/DEEP WOUND): Culture: NO GROWTH

## 2019-07-19 MED ORDER — TRAMADOL HCL 50 MG PO TABS
100.0000 mg | ORAL_TABLET | Freq: Every evening | ORAL | Status: DC | PRN
  Filled 2019-07-19: qty 2

## 2019-07-19 MED ORDER — ACETAMINOPHEN 500 MG PO TABS
1000.0000 mg | ORAL_TABLET | Freq: Three times a day (TID) | ORAL | Status: DC
  Administered 2019-07-19 – 2019-07-28 (×28): 1000 mg via ORAL
  Filled 2019-07-19 (×28): qty 2

## 2019-07-19 MED ORDER — ALUM & MAG HYDROXIDE-SIMETH 200-200-20 MG/5ML PO SUSP
30.0000 mL | Freq: Four times a day (QID) | ORAL | Status: DC | PRN
  Administered 2019-07-19 – 2019-07-27 (×11): 30 mL via ORAL
  Filled 2019-07-19 (×11): qty 30

## 2019-07-19 NOTE — Plan of Care (Signed)

## 2019-07-19 NOTE — Progress Notes (Addendum)
Pt has been refusing the SCD, Pt states " I dont need it,I'm  Moving my legs and MD is aware."  Attending MD made aware.

## 2019-07-19 NOTE — Progress Notes (Signed)
PROGRESS NOTE    Selena Rodgers   KDX:[id number]  DOB: 02-Nov-1950  DOA: 07/13/2019 PCP: [name], MD   Brief Narrative:  Selena Rodgers is a 69 y.o. female with medical history significant for chronic low back pain, spinal stenosis, large ventral hernia, depression/anxiety, GERD and arthritis.  She underwent laminectomy by [name] on June 23 of this year secondary to lumbar spondylolithiasis and severe spinal stenosis.  She was discharged to a SNF,  left there AMA after a few days.  She has been at home for the last 9 days.    Patient continued to have worsening weakness of the legs and back pain so came back to the emergency room.  Found to have hematoma and admitted to [facility].  She reports she has been having loose watery diarrhea for the last 3 to 4 days.  She reports she has had more than 12 episodes of watery diarrhea the day before admission.  No more bowel movement since admission.  Subjective: Patient seen and examined.  Today she is complaining of gas pain.  No bowel movement since admission.  Earlier she was reluctant to take laxatives because of explosive diarrhea she had, now she is agreeable to take it. Very emotional and sad because CIR was not approved.  Assessment & Plan:   Principal Problem:   Weakness and Paresthesia of right lower extremity secondary to epidural fluid collection with compression of the thecal sac, S/p laminectomy of L2-3, L3-4, L4-5 - s/p exploration and drainage of hematoma.  Clinically improving.  Working with PT OT. Adequate pain medications.   Pain well controlled on scheduled Tylenol, Mobic and tramadol at night. Declined by CIR.  Waiting to go to SNF.  Diarrhea and positive C. difficile: - C diff antigen and PCR positive, normal WBC count, no fever, no tenderness on exam-  -No more diarrhea since admission.  So not treated. - Start bowel regimen now.   Morbid obesity  - needs to lose weight   Very large ventral  hernia -Uncomplicated.  Irreducible.  She is planning to follow-up with surgeon once back issues improved.  Time spent in minutes: 25 minutes DVT prophylaxis: SCD's Start: 07/14/19 2033 SCDs Start: 07/13/19 2228  Code Status: Full code Family Communication: None today. Disposition Plan:  Status is: Inpatient  Remains inpatient appropriate because: s/p lumbar surgery, needs rehab   Dispo: The patient is from: Home              Anticipated d/c is to: Skilled nursing facility.              Anticipated d/c date is: When insurance approval /bed available.              Patient currently is medically stable for discharge only to a skilled level of care.   Consultants:   NS Procedures:   I and D of lumbar wound, evacuation of hematoma, negative cultures Antimicrobials:  Anti-infectives (From admission, onward)   Start     Dose/Rate Route Frequency Ordered Stop   07/15/19 0200  ceFAZolin (ANCEF) IVPB 2g/100 mL premix        2 g 200 mL/hr over 30 Minutes Intravenous Every 8 hours 07/14/19 2032 07/15/19 0953   07/14/19 1703  bacitracin 50,000 Units in sodium chloride 0.9 % 500 mL irrigation  Status:  Discontinued          As needed 07/14/19 1703 07/14/19 1736       Objective: Vitals:  07/18/19 1559 07/18/19 1923 07/19/19 0349 07/19/19 0740  BP: 118/71 139/63 (!) 154/77 (!) 149/68  Pulse: 98 81 79 77  Resp: 17 16 16 16   Temp: 98.7 F (37.1 C) 98.4 F (36.9 C) 98.6 F (37 C) 98.3 F (36.8 C)  TempSrc: Oral Oral Oral Oral  SpO2: 97% 93% 94% 95%  Weight:      Height:        Intake/Output Summary (Last 24 hours) at 07/19/2019 1141 Last data filed at 07/19/2019 0900 Gross per 24 hour  Intake 840 ml  Output 500 ml  Net 340 ml   Filed Weights   07/14/19 1248  Weight: 121 kg    Examination: Physical Exam Constitutional:      Appearance: Normal appearance.  HENT:     Head: Atraumatic.  Cardiovascular:     Rate and Rhythm: Regular rhythm.     Heart sounds: Normal  heart sounds.  Abdominal:     Palpations: Abdomen is soft.     Comments: Very large reducible ventral hernia present.  Musculoskeletal:        General: Normal range of motion.     Comments: Dry dressing on the back.  Nontender.  Neurological:     Mental Status: She is alert.  Psychiatric:     Comments: Anxious.    Moves all extremities.  Both legs are generalized weakness.  Able to lift up against gravity.  Data Reviewed: I have personally reviewed following labs and imaging studies  CBC: Recent Labs  Lab 07/13/19 1023 07/14/19 0319 07/15/19 0416  WBC 12.8* 9.9 9.6  NEUTROABS 9.1*  --   --   HGB 11.7* 10.3* 9.4*  HCT 35.2* 33.2* 30.1*  MCV 90.5 93.3 95.3  PLT 307 271 703   Basic Metabolic Panel: Recent Labs  Lab 07/13/19 1023 07/14/19 0319 07/15/19 0416  NA 133* 137 138  K 4.8 4.4 4.2  CL 99 102 103  CO2 25 28 26   GLUCOSE 133* 128* 99  BUN 14 7* 7*  CREATININE 0.72 0.72 0.65  CALCIUM 8.6* 8.8* 8.2*   GFR: Estimated Creatinine Clearance: 93.3 mL/min (by C-G formula based on SCr of 0.65 mg/dL). Liver Function Tests: Recent Labs  Lab 07/13/19 1023  AST 18  ALT 8  ALKPHOS 140*  BILITOT 0.8  PROT 7.2  ALBUMIN 3.3*   No results for input(s): LIPASE, AMYLASE in the last 168 hours. No results for input(s): AMMONIA in the last 168 hours. Coagulation Profile: No results for input(s): INR, PROTIME in the last 168 hours. Cardiac Enzymes: No results for input(s): CKTOTAL, CKMB, CKMBINDEX, TROPONINI in the last 168 hours. BNP (last 3 results) No results for input(s): PROBNP in the last 8760 hours. HbA1C: No results for input(s): HGBA1C in the last 72 hours. CBG: No results for input(s): GLUCAP in the last 168 hours. Lipid Profile: No results for input(s): CHOL, HDL, LDLCALC, TRIG, CHOLHDL, LDLDIRECT in the last 72 hours. Thyroid Function Tests: No results for input(s): TSH, T4TOTAL, FREET4, T3FREE, THYROIDAB in the last 72 hours. Anemia Panel: No results  for input(s): VITAMINB12, FOLATE, FERRITIN, TIBC, IRON, RETICCTPCT in the last 72 hours. Urine analysis:    Component Value Date/Time   COLORURINE Yellow 12/27/2013 1240   APPEARANCEUR Turbid 12/27/2013 1240   LABSPEC 1.026 12/27/2013 1240   PHURINE 5.0 12/27/2013 1240   GLUCOSEU Negative 12/27/2013 1240   HGBUR 2+ 12/27/2013 1240   BILIRUBINUR Negative 12/27/2013 1240   KETONESUR Negative 12/27/2013 1240   PROTEINUR 30  mg/dL 12/27/2013 1240   NITRITE Negative 12/27/2013 1240   LEUKOCYTESUR Negative 12/27/2013 1240   Sepsis Labs: @LABRCNTIP (procalcitonin:4,lacticidven:4) ) Recent Results (from the past 240 hour(s))  SARS Coronavirus 2 by RT PCR (hospital order, performed in [facility] hospital lab) Nasopharyngeal Nasopharyngeal Swab     Status: None   Collection Time: 07/13/19 10:23 AM   Specimen: Nasopharyngeal Swab  Result Value Ref Range Status   SARS Coronavirus 2 NEGATIVE NEGATIVE Final    Comment: (NOTE) SARS-CoV-2 target nucleic acids are NOT DETECTED.  The SARS-CoV-2 RNA is generally detectable in upper and lower respiratory specimens during the acute phase of infection. The lowest concentration of SARS-CoV-2 viral copies this assay can detect is 250 copies / mL. A negative result does not preclude SARS-CoV-2 infection and should not be used as the sole basis for treatment or other patient management decisions.  A negative result may occur with improper specimen collection / handling, submission of specimen other than nasopharyngeal swab, presence of viral mutation(s) within the areas targeted by this assay, and inadequate number of viral copies (<250 copies / mL). A negative result must be combined with clinical observations, patient history, and epidemiological information.  Fact Sheet for Patients:   StrictlyIdeas.no  Fact Sheet for Healthcare Providers: BankingDealers.co.za  This test is not yet approved or   cleared by the [place] FDA and has been authorized for detection and/or diagnosis of SARS-CoV-2 by FDA under an Emergency Use Authorization (EUA).  This EUA will remain in effect (meaning this test can be used) for the duration of the COVID-19 declaration under Section 564(b)(1) of the Act, 21 U.S.C. section 360bbb-3(b)(1), unless the authorization is terminated or revoked sooner.  Performed at [facility], [address] [place], [place] [address]   Surgical pcr screen     Status: None   Collection Time: 07/13/19 10:41 PM   Specimen: Nasal Mucosa; Nasal Swab  Result Value Ref Range Status   MRSA, PCR NEGATIVE NEGATIVE Final   Staphylococcus aureus NEGATIVE NEGATIVE Final    Comment: (NOTE) The Xpert SA Assay (FDA approved for NASAL specimens in patients 46 years of age and older), is one component of a comprehensive surveillance program. It is not intended to diagnose infection nor to guide or monitor treatment. Performed at [facility], [place] [address] [place], [place] [address]   C Difficile Quick Screen w PCR reflex     Status: Abnormal   Collection Time: 07/14/19 12:46 AM   Specimen: STOOL  Result Value Ref Range Status   C Diff antigen POSITIVE (A) NEGATIVE Final   C Diff toxin NEGATIVE NEGATIVE Final   C Diff interpretation Results are indeterminate. See PCR results.  Final    Comment: Performed at [facility], [place] [address] [place], [place] [address]  C. Diff by PCR, Reflexed     Status: Abnormal   Collection Time: 07/14/19 12:46 AM  Result Value Ref Range Status   Toxigenic C. Difficile by PCR POSITIVE (A) NEGATIVE Final    Comment: Positive for toxigenic C. difficile with little to no toxin production. Only treat if clinical presentation suggests symptomatic illness. Performed at [facility], [place] [address] [place], [place] [address]   Aerobic/Anaerobic Culture (surgical/deep wound)     Status: None (Preliminary result)    Collection Time: 07/14/19  5:08 PM   Specimen: Soft Tissue, Other  Result Value Ref Range Status   Specimen Description WOUND  Final   Special  Requests EPIDURAL FLUID SWAB  Final   Gram Stain   Final    FEW WBC PRESENT, PREDOMINANTLY PMN NO ORGANISMS SEEN    Culture   Final    NO GROWTH 4 DAYS NO ANAEROBES ISOLATED; CULTURE IN PROGRESS FOR 5 DAYS Performed at [facility], [address] [place], [place] [address]    Report Status PENDING  Incomplete         Radiology Studies: No results found.    Scheduled Meds: . acetaminophen  1,000 mg Oral TID  . citalopram  20 mg Oral Daily  . docusate sodium  100 mg Oral BID  . meloxicam  15 mg Oral Daily  . pantoprazole  40 mg Oral Daily  . sodium chloride flush  3 mL Intravenous Q12H   Continuous Infusions: . sodium chloride       LOS: 6 days   Total time spent: 25 minutes   Barb Merino, MD [facility]

## 2019-07-20 NOTE — Progress Notes (Signed)
PROGRESS NOTE  Selena Rodgers  DOB: 12/05/1950  PCP: [name], MD IHK:[id number]  DOA: 07/13/2019  LOS: 7 days   No chief complaint on file.  Brief narrative: Selena Rodgers is a 69 y.o. femalewith PMH significant for chronic low back pain, spinal stenosis, large ventral hernia, depression/anxiety, GERD and arthritis. She underwent laminectomy by [name] on June 25, 2019 for lumbar spondylolithiasis and severe spinal stenosis. She was discharged to a SNF,  left there AMA after a few days.  She stayed at home for the 7-10 days.  She continued to have worsening weakness of the legs and back pain so came back to the emergency room on 7/11.   In the ED, she underwent MRI lumbar spine which showed a large complex fluid collection in the laminectomy bed from L2-L5 measuring approximately 6.6 x 4.0 x 4.1 cm. There is moderate mass effect on the posterior aspect of the thecal sac and findings worrisome for abscess or CSF leak.  It also showed large complex subcutaneous fluid collection measuring 13.5 cm in length.  She was admitted to hospitalist service.   Subjective: Patient was seen and examined this morning.  Elderly obese Caucasian female.  Sitting up in chair.  Not in distress.  She said she was given a Dulcolax yesterday.  Had multiple bowel movements last 24 hours, last one today.  Assessment/Plan: Epidural fluid collection with compression of the thecal sac -Presented with weakness and paresthesia of right lower extremity.  -s/p laminectomy of L2-3, L3-4, L4-5. -s/p exploration and drainage of the collection.  Clinically improving.  Working with PT OT. -Pain well controlled on scheduled Tylenol, Mobic and tramadol at night. -Declined by CIR.  Waiting to go to SNF.  Acute diarrhea C. difficile antigen positive, toxin negative -On admission, patient reported loose watery diarrhea for 3 to 4 days.  -Stool sample sent on 7/12 was positive for C. difficile antigen  and PCR but negative for toxin.  -Patient stopped having bowel movement and in fact did not have any till 7/17 until she had a Dulcolax.  -No fever, normal WBC count.  No abdominal tenderness.  -Not treated for C. difficile.  Morbid obesity - Body mass index is 41.77 kg/m. Patient has been advised to make an attempt to improve diet and exercise patterns to aid in weight loss.   Very large ventral hernia -Uncomplicated.  Irreducible.  She is planning to follow-up with surgeon once back issues improved.  Mobility: PT/OT evaluation.  Rehab recommended. Code Status:   Code Status: Full Code  Nutritional status: Body mass index is 41.77 kg/m.     Diet Order            Diet regular Room service appropriate? Yes; Fluid consistency: Thin  Diet effective now                 DVT prophylaxis: SCD's Start: 07/14/19 2033 SCDs Start: 07/13/19 2228   Antimicrobials:  Currently on antibiotics Fluid: None  Consultants: Neurosurgery Family Communication:  None  Status is: Inpatient  Remains inpatient appropriate because needs rehab.   Dispo: The patient is from: Home              Anticipated d/c is to: SNF              Anticipated d/c date is: When insurance authorization/bed is available              Patient currently is medically stable to d/c.  Infusions:  . sodium chloride      Scheduled Meds: . acetaminophen  1,000 mg Oral TID  . citalopram  20 mg Oral Daily  . docusate sodium  100 mg Oral BID  . meloxicam  15 mg Oral Daily  . pantoprazole  40 mg Oral Daily  . sodium chloride flush  3 mL Intravenous Q12H    Antimicrobials: Anti-infectives (From admission, onward)   Start     Dose/Rate Route Frequency Ordered Stop   07/15/19 0200  ceFAZolin (ANCEF) IVPB 2g/100 mL premix        2 g 200 mL/hr over 30 Minutes Intravenous Every 8 hours 07/14/19 2032 07/15/19 0953   07/14/19 1703  bacitracin 50,000 Units in sodium chloride 0.9 % 500 mL irrigation  Status:   Discontinued          As needed 07/14/19 1703 07/14/19 1736      PRN meds: albuterol, alum & mag hydroxide-simeth, bisacodyl, menthol-cetylpyridinium **OR** phenol, morphine injection, ondansetron **OR** ondansetron (ZOFRAN) IV, oxyCODONE, oxyCODONE, promethazine, sodium chloride flush, traMADol, zolpidem   Objective: Vitals:   07/20/19 0221 07/20/19 0814  BP: (!) 165/83 (!) 154/87  Pulse: 76 73  Resp: 16 16  Temp: 98.2 F (36.8 C) 98.5 F (36.9 C)  SpO2: 94% 97%    Intake/Output Summary (Last 24 hours) at 07/20/2019 1448 Last data filed at 07/20/2019 0900 Gross per 24 hour  Intake 480 ml  Output --  Net 480 ml   Filed Weights   07/14/19 1248  Weight: 121 kg   Weight change:  Body mass index is 41.77 kg/m.   Physical Exam: General exam: Appears calm and comfortable.  Morbidly obese Skin: No rashes, lesions or ulcers. HEENT: Atraumatic, normocephalic, supple neck, no obvious bleeding Lungs: Clear to auscultation bilaterally CVS: Regular rate and rhythm, no murmur GI/Abd soft, nontender, nondistended, also present CNS: Alert, awake, oriented x3 Psychiatry: Mood appropriate Extremities: No pedal edema, no calf tenderness  Data Review: I have personally reviewed the laboratory data and studies available.  Recent Labs  Lab 07/14/19 0319 07/15/19 0416  WBC 9.9 9.6  HGB 10.3* 9.4*  HCT 33.2* 30.1*  MCV 93.3 95.3  PLT 271 229   Recent Labs  Lab 07/14/19 0319 07/15/19 0416  NA 137 138  K 4.4 4.2  CL 102 103  CO2 28 26  GLUCOSE 128* 99  BUN 7* 7*  CREATININE 0.72 0.65  CALCIUM 8.8* 8.2*    Signed, Terrilee Croak, MD [facility] Pager: [phone] (Secure Chat preferred). 07/20/2019

## 2019-07-21 LAB — BASIC METABOLIC PANEL
Anion gap: 9 (ref 5–15)
BUN: 13 mg/dL (ref 8–23)
CO2: 23 mmol/L (ref 22–32)
Calcium: 9.1 mg/dL (ref 8.9–10.3)
Chloride: 107 mmol/L (ref 98–111)
Creatinine, Ser: 0.7 mg/dL (ref 0.44–1.00)
GFR calc Af Amer: >60 mL/min (ref >60)
GFR calc non Af Amer: >60 mL/min (ref >60)
Glucose, Bld: 138 mg/dL — ABNORMAL HIGH (ref 70–99)
Potassium: 4 mmol/L (ref 3.5–5.1)
Sodium: 139 mmol/L (ref 135–145)

## 2019-07-21 LAB — CBC WITH DIFFERENTIAL/PLATELET
Abs Immature Granulocytes: 0.03 10*3/uL (ref 0.00–0.07)
Basophils Absolute: 0.1 10*3/uL (ref 0.0–0.1)
Basophils Relative: 1 %
Eosinophils Absolute: 0.8 10*3/uL — ABNORMAL HIGH (ref 0.0–0.5)
Eosinophils Relative: 8 %
HCT: 35.4 % — ABNORMAL LOW (ref 36.0–46.0)
Hemoglobin: 11.1 g/dL — ABNORMAL LOW (ref 12.0–15.0)
Immature Granulocytes: 0 %
Lymphocytes Relative: 20 %
Lymphs Abs: 2 10*3/uL (ref 0.7–4.0)
MCH: 29.5 pg (ref 26.0–34.0)
MCHC: 31.4 g/dL (ref 30.0–36.0)
MCV: 94.1 fL (ref 80.0–100.0)
Monocytes Absolute: 0.7 10*3/uL (ref 0.1–1.0)
Monocytes Relative: 7 %
Neutro Abs: 6.5 10*3/uL (ref 1.7–7.7)
Neutrophils Relative %: 64 %
Platelets: 236 10*3/uL (ref 150–400)
RBC: 3.76 MIL/uL — ABNORMAL LOW (ref 3.87–5.11)
RDW: 13.8 % (ref 11.5–15.5)
WBC: 10.1 10*3/uL (ref 4.0–10.5)
nRBC: 0 % (ref 0.0–0.2)

## 2019-07-21 MED ORDER — LOPERAMIDE HCL 2 MG PO CAPS
2.0000 mg | ORAL_CAPSULE | Freq: Once | ORAL | Status: AC
  Administered 2019-07-21: 2 mg via ORAL
  Filled 2019-07-21: qty 1

## 2019-07-21 NOTE — Plan of Care (Signed)
°  Problem: Education: Goal: Knowledge of General Education information will improve Description: Including pain rating scale, medication(s)/side effects and non-pharmacologic comfort measures 07/21/2019 0851 by Madaline Brilliant, RN Outcome: Progressing 07/21/2019 0848 by Madaline Brilliant, RN Outcome: Progressing   Problem: Health Behavior/Discharge Planning: Goal: Ability to manage health-related needs will improve 07/21/2019 0851 by Madaline Brilliant, RN Outcome: Progressing 07/21/2019 0848 by Madaline Brilliant, RN Outcome: Progressing   Problem: Clinical Measurements: Goal: Ability to maintain clinical measurements within normal limits will improve 07/21/2019 0851 by Madaline Brilliant, RN Outcome: Progressing 07/21/2019 0848 by Madaline Brilliant, RN Outcome: Progressing Goal: Will remain free from infection 07/21/2019 0851 by Madaline Brilliant, RN Outcome: Progressing 07/21/2019 0848 by Madaline Brilliant, RN Outcome: Progressing Goal: Diagnostic test results will improve 07/21/2019 0851 by Madaline Brilliant, RN Outcome: Progressing 07/21/2019 0848 by Madaline Brilliant, RN Outcome: Progressing Goal: Respiratory complications will improve 07/21/2019 0851 by Madaline Brilliant, RN Outcome: Progressing 07/21/2019 0848 by Madaline Brilliant, RN Outcome: Progressing Goal: Cardiovascular complication will be avoided 07/21/2019 0851 by Madaline Brilliant, RN Outcome: Progressing 07/21/2019 0848 by Madaline Brilliant, RN Outcome: Progressing   Problem: Activity: Goal: Risk for activity intolerance will decrease 07/21/2019 0851 by Madaline Brilliant, RN Outcome: Progressing 07/21/2019 0848 by Madaline Brilliant, RN Outcome: Progressing   Problem: Nutrition: Goal: Adequate nutrition will be maintained 07/21/2019 0851 by Madaline Brilliant, RN Outcome: Progressing 07/21/2019 0848 by Madaline Brilliant, RN Outcome: Progressing   Problem: Coping: Goal: Level of anxiety will decrease 07/21/2019 0851 by Madaline Brilliant, RN Outcome: Progressing 07/21/2019 0848 by Madaline Brilliant, RN Outcome: Progressing   Problem: Elimination: Goal: Will not experience complications related to bowel motility 07/21/2019 0851 by Madaline Brilliant, RN Outcome: Progressing 07/21/2019 0848 by Madaline Brilliant, RN Outcome: Progressing Goal: Will not experience complications related to urinary retention 07/21/2019 0851 by Madaline Brilliant, RN Outcome: Progressing 07/21/2019 0848 by Madaline Brilliant, RN Outcome: Progressing   Problem: Pain Managment: Goal: General experience of comfort will improve 07/21/2019 0851 by Madaline Brilliant, RN Outcome: Progressing 07/21/2019 0848 by Madaline Brilliant, RN Outcome: Progressing   Problem: Safety: Goal: Ability to remain free from injury will improve 07/21/2019 0851 by Madaline Brilliant, RN Outcome: Progressing 07/21/2019 0848 by Madaline Brilliant, RN Outcome: Progressing   Problem: Skin Integrity: Goal: Risk for impaired skin integrity will decrease 07/21/2019 0851 by Madaline Brilliant, RN Outcome: Progressing 07/21/2019 0848 by Madaline Brilliant, RN Outcome: Progressing

## 2019-07-21 NOTE — Plan of Care (Signed)

## 2019-07-21 NOTE — Progress Notes (Signed)
Occupational Therapy Treatment Patient Details Name: Selena Rodgers MRN: [id number] DOB: 08/21/1950 Today's Date: 07/21/2019    History of present illness Selena Rodgers is a 69 y.o. female with medical history significant for chronic low back pain, spinal stenosis, large ventral hernia, depression/anxiety, GERD and arthritis.  She underwent laminectomy by [name] on June 23 of this year secondary to lumbar spondylolithiasis and severe spinal stenosis.  She was discharged to a SNF left there AMA after a few days.  She has been at home for the last 9 days.I and D of lumbar wound, evacuation of hematome   OT comments  Pt pleased with her progress and ability to take steps using sara plus. Pt able to perform grooming with set up aat EOB, UB dressing with min assist (front opening gown) and max for back brace, and total assist for socks and shoes. Updated d/c to SNF as insurance has denied CIR.  Follow Up Recommendations  SNF;Supervision/Assistance - 24 hour    Equipment Recommendations  3 in 1 bedside commode;Wheelchair (measurements OT);Wheelchair cushion (measurements OT);Hospital bed    Recommendations for Other Services      Precautions / Restrictions Precautions Precautions: Back;Fall Precaution Booklet Issued: Yes (comment) Precaution Comments: reviewed spinal precautions, log roll technique in and out of bed Required Braces or Orthoses: Spinal Brace Spinal Brace: Applied in sitting position;Lumbar corset Restrictions Other Position/Activity Restrictions: Pt has large hernia on L side of abdomen.       Mobility Bed Mobility Overal bed mobility: Needs Assistance Bed Mobility: Rolling;Sidelying to Sit Rolling: Min assist Sidelying to sit: Mod assist;HOB elevated;+2 for physical assistance       General bed mobility comments: Pt performed rolling to R and L side to advance to edge of bed.  Pt required assistance to elevate trunk into a seated position.  Once in  sitting reports mild dizziness but subsides.  Reports wearing glasses reduces dizziness.  Transfers Overall transfer level: Needs assistance Equipment used: 4-wheeled walker;Bilateral platform walker (sara + sit to stand with foot plate removed.) Transfers: Sit to/from Stand Sit to Stand: Total assist;+2 physical assistance         General transfer comment: Pt performed sit to stand from edge of bed with sara + sit to stand lift.  Pt required cues for use to maintain upper trunk control.  R knee instability noted in standing.    Balance Overall balance assessment: Needs assistance   Sitting balance-Leahy Scale: Fair       Standing balance-Leahy Scale: Poor                             ADL either performed or assessed with clinical judgement   ADL Overall ADL's : Needs assistance/impaired     Grooming: Wash/dry hands;Wash/dry face;Brushing hair;Sitting;Set up           Upper Body Dressing : Minimal assistance;Sitting Upper Body Dressing Details (indicate cue type and reason): front opening gown, max assist for back brace Lower Body Dressing: Total assistance;Sitting/lateral leans Lower Body Dressing Details (indicate cue type and reason): shoes and socks     Toileting- Clothing Manipulation and Hygiene: Total assistance;Bed level               Vision       Perception     Praxis      Cognition Arousal/Alertness: Awake/alert Behavior During Therapy: WFL for tasks assessed/performed Overall Cognitive Status: Within Functional Limits for tasks  assessed Area of Impairment: Safety/judgement;Problem solving                   Current Attention Level: Focused   Following Commands: Follows multi-step commands with increased time Safety/Judgement: Decreased awareness of safety;Decreased awareness of deficits Awareness: Emergent Problem Solving: Requires verbal cues General Comments: pt anxious upon arrival with concerns about her diarrhea,  but after seeing progress using sara plus, pt in much better spirits        Exercises     Shoulder Instructions       General Comments      Pertinent Vitals/ Pain       Pain Assessment: Faces Faces Pain Scale: Hurts little more Pain Location: back, R knee with extension Pain Descriptors / Indicators: Aching;Discomfort;Sore Pain Intervention(s): Monitored during session;Repositioned  Home Living                                          Prior Functioning/Environment              Frequency  Min 2X/week        Progress Toward Goals  OT Goals(current goals can now be found in the care plan section)  Progress towards OT goals: Progressing toward goals  Acute Rehab OT Goals Patient Stated Goal: to go to rehab then home OT Goal Formulation: With patient Time For Goal Achievement: 07/29/19 Potential to Achieve Goals: Good  Plan Discharge plan needs to be updated    Co-evaluation    PT/OT/SLP Co-Evaluation/Treatment: Yes Reason for Co-Treatment: Complexity of the patient's impairments (multi-system involvement) PT goals addressed during session: Mobility/safety with mobility OT goals addressed during session: ADL's and self-care      AM-PAC OT "6 Clicks" Daily Activity     Outcome Measure   Help from another person eating meals?: None Help from another person taking care of personal grooming?: A Little Help from another person toileting, which includes using toliet, bedpan, or urinal?: Total Help from another person bathing (including washing, rinsing, drying)?: A Lot Help from another person to put on and taking off regular upper body clothing?: A Little Help from another person to put on and taking off regular lower body clothing?: Total 6 Click Score: 14    End of Session Equipment Utilized During Treatment: Back brace  OT Visit Diagnosis: Unsteadiness on feet (R26.81);Other abnormalities of gait and mobility (R26.89);Muscle weakness  (generalized) (M62.81);Repeated falls (R29.6);Pain   Activity Tolerance Patient tolerated treatment well   Patient Left in chair;with call bell/phone within reach   Nurse Communication Need for lift equipment        Time: 1610-9604 OT Time Calculation (min): 42 min  Charges: OT General Charges $OT Visit: 1 Visit OT Treatments $Self Care/Home Management : 8-22 mins  Nestor Lewandowsky, OTR/L Acute Rehabilitation Services Pager: [phone] Office: [phone]   Malka So 07/21/2019, 1:34 PM

## 2019-07-21 NOTE — Progress Notes (Signed)
Physical Therapy Treatment Patient Details Name: Selena Rodgers MRN: [id number] DOB: 1950-07-30 Today's Date: 07/21/2019    History of Present Illness Selena Rodgers is a 69 y.o. female with medical history significant for chronic low back pain, spinal stenosis, large ventral hernia, depression/anxiety, GERD and arthritis.  She underwent laminectomy by [name] on June 23 of this year secondary to lumbar spondylolithiasis and severe spinal stenosis.  She was discharged to a SNF left there AMA after a few days.  She has been at home for the last 9 days.I and D of lumbar wound, evacuation of hematome    PT Comments    Pt supine in bed on arrival.  Pt reports she has had bowel incontinence but incontinence is mostly urine.  PTA and NT rolled patient for clean-up.  Pt required +2 mod assistance to rise into sitting after pericare.  Utilized sara + to achieve standing and progress gt.  Pt able to ambulate x 8 ft with R knee instability supported by blocking plate.  Continue to recommend aggressive rehab in a post acute setting before returning home.    Follow Up Recommendations  CIR     Equipment Recommendations  None recommended by PT    Recommendations for Other Services       Precautions / Restrictions Precautions Precautions: Fall;Cervical Precaution Booklet Issued: Yes (comment) Precaution Comments: reviewed spinal precautions, log roll technique in and out of bed Required Braces or Orthoses: Spinal Brace Spinal Brace: Applied in sitting position;Lumbar corset Restrictions Other Position/Activity Restrictions: Pt has large hernia on L side of abdomen.    Mobility  Bed Mobility Overal bed mobility: Needs Assistance Bed Mobility: Rolling;Sidelying to Sit Rolling: Min assist Sidelying to sit: Mod assist;HOB elevated;+2 for physical assistance       General bed mobility comments: Pt performed rolling to R and L side to advance to edge of bed.  Pt required assistance to  elevate trunk into a seated position.  Once in sitting reports mild dizziness but subsides.  Reports wearing glasses reduces dizziness.  Transfers Overall transfer level: Needs assistance Equipment used: 4-wheeled walker;Bilateral platform walker (sara + sit to stand with foot plate removed.) Transfers: Sit to/from Stand Sit to Stand: Total assist;+2 physical assistance (dependent lift into standing with cues for upper trunk engagement and pressing through B platform.)         General transfer comment: Pt performed sit to stand from edge of bed with sara + sit to stand lift.  Pt required cues for use to maintain upper trunk control.  R knee instability noted in standing.  Ambulation/Gait Ambulation/Gait assistance: Mod assist;+2 physical assistance Gait Distance (Feet): 8 Feet Assistive device: 4-wheeled walker;Bilateral platform walker (sara + sit to stand lift.) Gait Pattern/deviations: Step-to pattern;Trunk flexed;Decreased stance time - right;Decreased stride length;Decreased dorsiflexion - right;Antalgic     General Gait Details: Pt with poor quad activation on R side. Noted to buckle a few times.  Pt able to maintain standing in frame with +2 for weight shifting and progressing sara + forward inline with patient's steps.  Pt slow and guarded with purposeful steps and close chair follow.   Stairs             Wheelchair Mobility    Modified Rankin (Stroke Patients Only)       Balance Overall balance assessment: Needs assistance   Sitting balance-Leahy Scale: Fair       Standing balance-Leahy Scale: Poor  Cognition Arousal/Alertness: Awake/alert Behavior During Therapy: WFL for tasks assessed/performed Overall Cognitive Status: Within Functional Limits for tasks assessed Area of Impairment: Safety/judgement;Problem solving                   Current Attention Level: Focused   Following Commands: Follows  multi-step commands with increased time Safety/Judgement: Decreased awareness of safety;Decreased awareness of deficits Awareness: Emergent Problem Solving: Requires verbal cues General Comments: pt making jokes with therapists, very pleasand motivated to engage in session. Pt does show some increased awareness of safety/deficitis with problem solving due to her long term decision making/planning (ex. leaving SNF AMA).      Exercises      General Comments        Pertinent Vitals/Pain Pain Assessment: Faces Faces Pain Scale: Hurts little more Pain Location: back, R knee with extension Pain Descriptors / Indicators: Aching;Discomfort;Sore Pain Intervention(s): Monitored during session;Repositioned    Home Living                      Prior Function            PT Goals (current goals can now be found in the care plan section) Acute Rehab PT Goals Patient Stated Goal: to go to rehab then home Potential to Achieve Goals: Fair Progress towards PT goals: Progressing toward goals    Frequency    Min 3X/week      PT Plan Current plan remains appropriate    Co-evaluation PT/OT/SLP Co-Evaluation/Treatment: Yes Reason for Co-Treatment: Complexity of the patient's impairments (multi-system involvement) PT goals addressed during session: Mobility/safety with mobility OT goals addressed during session: ADL's and self-care      AM-PAC PT "6 Clicks" Mobility   Outcome Measure  Help needed turning from your back to your side while in a flat bed without using bedrails?: A Lot Help needed moving from lying on your back to sitting on the side of a flat bed without using bedrails?: A Lot Help needed moving to and from a bed to a chair (including a wheelchair)?: A Lot Help needed standing up from a chair using your arms (e.g., wheelchair or bedside chair)?: Total Help needed to walk in hospital room?: Total Help needed climbing 3-5 steps with a railing? : Total 6 Click  Score: 9    End of Session Equipment Utilized During Treatment: Back brace;Gait belt (sara + for ambulation) Activity Tolerance: Patient tolerated treatment well;Patient limited by fatigue Patient left: in bed;with call bell/phone within reach;with bed alarm set;Other (comment) Nurse Communication: Mobility status PT Visit Diagnosis: Other abnormalities of gait and mobility (R26.89);Muscle weakness (generalized) (M62.81)     Time: 3818-2993 PT Time Calculation (min) (ACUTE ONLY): 46 min  Charges:  $Gait Training: 8-22 mins $Therapeutic Activity: 8-22 mins                     Erasmo Leventhal , PTA Acute Rehabilitation Services Pager [phone] Office [phone]     Selena Rodgers 07/21/2019, 12:57 PM

## 2019-07-21 NOTE — Progress Notes (Signed)
PROGRESS NOTE  Selena Rodgers  DOB: [birth year]  PCP: [name], MD DGL:[id number]  DOA: 07/13/2019  LOS: 8 days   No chief complaint on file.  Brief narrative: Selena Rodgers is a 69 y.o. femalewith PMH significant for chronic low back pain, spinal stenosis, large ventral hernia, depression/anxiety, GERD and arthritis. She underwent laminectomy by [name] on June 25, 2019 for lumbar spondylolithiasis and severe spinal stenosis. She was discharged to a SNF,  left there AMA after a few days.  She stayed at home for the 7-10 days.  She continued to have worsening weakness of the legs and back pain so came back to the emergency room on 7/11.   In the ED, she underwent MRI lumbar spine which showed a large complex fluid collection in the laminectomy bed from L2-L5 measuring approximately 6.6 x 4.0 x 4.1 cm. There is moderate mass effect on the posterior aspect of the thecal sac and findings worrisome for abscess or CSF leak.  It also showed large complex subcutaneous fluid collection measuring 13.5 cm in length.  She was admitted to hospitalist service.   Subjective: Patient was seen and examined this morning.  It [birth year] today.  Patient in tears, depressed for being in the hospital on her birthday. Complains of 6-7 episodes of loose bowel movement last night.  Received 1 dose of Imodium earlier today. No fever.  WBC count normal.  Assessment/Plan: Epidural fluid collection with compression of the thecal sac -Presented with weakness and paresthesia of right lower extremity.  -s/p laminectomy of L2-3, L3-4, L4-5. -s/p exploration and drainage of the collection.  Clinically improving.  Working with PT OT. -Pain well controlled on scheduled Tylenol, Mobic and tramadol at night. -Declined by CIR.  Waiting to go to SNF.  Acute diarrhea C. difficile antigen positive, toxin negative -On admission, patient reported loose watery diarrhea for 3 to 4 days.  -Stool  sample sent on 7/12 was positive for C. difficile antigen and PCR but negative for toxin.  -Patient stopped having bowel movement and in fact did not have any till 7/17 until she had a Dulcolax.  -Her last 48 hours, patient has multiple episodes of low volume stool output but without fever, abdominal tenderness or leukocytosis.   -Not on treatment for C. difficile.  On Imodium as needed -Monitor for next 24 hours.  If continues to have diarrhea, will start on oral vancomycin as well.  Morbid obesity - Body mass index is 41.77 kg/m. Patient has been advised to make an attempt to improve diet and exercise patterns to aid in weight loss.   Very large ventral hernia -Uncomplicated.  Irreducible.  She is planning to follow-up with surgeon once back issues improved.  Mobility: PT/OT evaluation.  Rehab recommended. Code Status:   Code Status: Full Code  Nutritional status: Body mass index is 41.77 kg/m.     Diet Order            Diet regular Room service appropriate? Yes; Fluid consistency: Thin  Diet effective now                 DVT prophylaxis: SCD's Start: 07/14/19 2033 SCDs Start: 07/13/19 2228   Antimicrobials:  Currently on antibiotics Fluid: None  Consultants: Neurosurgery Family Communication:  None  Status is: Inpatient  Remains inpatient appropriate because needs SNF.   Dispo: The patient is from: Home              Anticipated d/c is  to: SNF              Anticipated d/c date is: When insurance authorization/bed is available              Patient currently expect medical stability by tomorrow.   Infusions:  . sodium chloride      Scheduled Meds: . acetaminophen  1,000 mg Oral TID  . citalopram  20 mg Oral Daily  . meloxicam  15 mg Oral Daily  . pantoprazole  40 mg Oral Daily  . sodium chloride flush  3 mL Intravenous Q12H    Antimicrobials: Anti-infectives (From admission, onward)   Start     Dose/Rate Route Frequency Ordered Stop   07/15/19 0200   ceFAZolin (ANCEF) IVPB 2g/100 mL premix        2 g 200 mL/hr over 30 Minutes Intravenous Every 8 hours 07/14/19 2032 07/15/19 0953   07/14/19 1703  bacitracin 50,000 Units in sodium chloride 0.9 % 500 mL irrigation  Status:  Discontinued          As needed 07/14/19 1703 07/14/19 1736      PRN meds: albuterol, alum & mag hydroxide-simeth, bisacodyl, menthol-cetylpyridinium **OR** phenol, morphine injection, ondansetron **OR** ondansetron (ZOFRAN) IV, oxyCODONE, oxyCODONE, promethazine, sodium chloride flush, traMADol, zolpidem   Objective: Vitals:   07/20/19 2051 07/21/19 0450  BP: (!) 155/65 (!) 159/71  Pulse: 70 60  Resp: 16 18  Temp: 99 F (37.2 C) 98.5 F (36.9 C)  SpO2: 94% 94%    Intake/Output Summary (Last 24 hours) at 07/21/2019 1137 Last data filed at 07/21/2019 0500 Gross per 24 hour  Intake 240 ml  Output 400 ml  Net -160 ml   Filed Weights   07/14/19 1248  Weight: 121 kg   Weight change:  Body mass index is 41.77 kg/m.   Physical Exam: General exam: Appears calm and comfortable.  Morbidly obese. Skin: No rashes, lesions or ulcers. HEENT: Atraumatic, normocephalic, supple neck, no obvious bleeding Lungs: Clear to auscultation bilaterally CVS: Regular rate and rhythm, no murmur GI/Abd soft, no tenderness, nondistended, also present CNS: Alert, awake, oriented x3 Psychiatry: Tearful this morning Extremities: No pedal edema, no calf tenderness  Data Review: I have personally reviewed the laboratory data and studies available.  Recent Labs  Lab 07/15/19 0416 07/21/19 0240  WBC 9.6 10.1  NEUTROABS  --  6.5  HGB 9.4* 11.1*  HCT 30.1* 35.4*  MCV 95.3 94.1  PLT 229 236   Recent Labs  Lab 07/15/19 0416 07/21/19 0240  NA 138 139  K 4.2 4.0  CL 103 107  CO2 26 23  GLUCOSE 99 138*  BUN 7* 13  CREATININE 0.65 0.70  CALCIUM 8.2* 9.1    Signed, Terrilee Croak, MD [facility] Pager: [phone] (Secure Chat  preferred). 07/21/2019

## 2019-07-21 NOTE — Plan of Care (Signed)
  Problem: Education: Goal: Knowledge of General Education information will improve Description: Including pain rating scale, medication(s)/side effects and non-pharmacologic comfort measures Outcome: Progressing   Problem: Activity: Goal: Risk for activity intolerance will decrease Outcome: Progressing   Problem: Coping: Goal: Level of anxiety will decrease Outcome: Progressing   Problem: Pain Managment: Goal: General experience of comfort will improve Outcome: Progressing   Problem: Skin Integrity: Goal: Risk for impaired skin integrity will decrease Outcome: Progressing   

## 2019-07-21 NOTE — Progress Notes (Signed)
Very distressed in regards to her bowel issue- she's had 6 loose stools through night- small ones- causing a lot of irritation-barrier cream is being applied after each-but she is requesting something to stop it. Has been explained to her if its c- diff antidiarrhea medication cannot be used.

## 2019-07-22 MED ORDER — HYDROCORTISONE ACETATE 25 MG RE SUPP
25.0000 mg | Freq: Two times a day (BID) | RECTAL | Status: DC
  Administered 2019-07-23 – 2019-07-24 (×2): 25 mg via RECTAL
  Filled 2019-07-22 (×8): qty 1

## 2019-07-22 NOTE — Progress Notes (Signed)
The patient has sat up in a recliner most of the day and tolerated well.  The patient still needs the Maxi lift to transport from bed to chair.

## 2019-07-22 NOTE — Progress Notes (Signed)
Subjective:   The patient is alert and pleasant.  She is in no apparent distress.  She ambulated yesterday.  Objective: Vital signs in last 24 hours: Temp:  [97.5 F (36.4 C)-98.8 F (37.1 C)] 98.2 F (36.8 C) (07/20 0416) Pulse Rate:  [68-70] 69 (07/20 0416) Resp:  [16-18] 16 (07/20 0416) BP: (138-161)/(59-80) 138/59 (07/20 0416) SpO2:  [88 %-100 %] 94 % (07/20 0416) Estimated body mass index is 41.77 kg/m as calculated from the following:   Height as of this encounter: 5' 7.01" (1.702 m).   Weight as of this encounter: 121 kg.   Intake/Output from previous day: No intake/output data recorded. Intake/Output this shift: No intake/output data recorded.  Physical exam   The patient is alert and oriented.  Her wound is healing well.  I removed the dressing.  She is moving her lower extremities well.  Lab Results: Recent Labs   07/21/19 0240 WBC 10.1 HGB 11.1* HCT 35.4* PLT 236  BMET Recent Labs   07/21/19 0240 NA 139 K 4.0 CL 107 CO2 23 GLUCOSE 138* BUN 13 CREATININE 0.70 CALCIUM 9.1   Studies/Results: No results found.  Assessment/Plan:   Postop day number 8 status post evacuation of epidural fluid collection:  The patient is slowly progressing.  We are awaiting skilled  nursing facility replacement.  I have answered all her questions.  LOS: 9 days     Ophelia Charter 07/22/2019, 8:20 AM

## 2019-07-22 NOTE — Plan of Care (Signed)

## 2019-07-22 NOTE — Progress Notes (Signed)
PROGRESS NOTE  Selena Rodgers  DOB: 1950-04-15  PCP: [name], MD LNL:[id number]  DOA: 07/13/2019  LOS: 9 days   No chief complaint on file.  Brief narrative: Selena Rodgers is a 69 y.o. femalewith PMH significant for chronic low back pain, spinal stenosis, large ventral hernia, depression/anxiety, GERD and arthritis. She underwent laminectomy by [name] on June 25, 2019 for lumbar spondylolithiasis and severe spinal stenosis. She was discharged to a SNF,  left there AMA after a few days.  She stayed at home for the 7-10 days.  She continued to have worsening weakness of the legs and back pain so came back to the emergency room on 7/11.   In the ED, she underwent MRI lumbar spine which showed a large complex fluid collection in the laminectomy bed from L2-L5 measuring approximately 6.6 x 4.0 x 4.1 cm. There is moderate mass effect on the posterior aspect of the thecal sac and findings worrisome for abscess or CSF leak.  It also showed large complex subcutaneous fluid collection measuring 13.5 cm in length.  She was admitted to hospitalist service.   Subjective: Patient was seen and examined this morning.  Patient is in good mood today.  Diarrhea has stopped. Patient has been accepted at a facility.  Pending insurance authorization.  Assessment/Plan: Epidural fluid collection with compression of the thecal sac -Presented with weakness and paresthesia of right lower extremity.  -s/p laminectomy of L2-3, L3-4, L4-5. -s/p exploration and drainage of the collection.  Clinically improving.  Working with PT OT. -Pain well controlled on scheduled Tylenol, Mobic and tramadol at night. -Pending SNF placement  Acute diarrhea C. difficile antigen positive, toxin negative -On admission, patient reported loose watery diarrhea for 3 to 4 days.  -Stool sample sent on 7/12 was positive for C. difficile antigen and PCR but negative for toxin.  -Patient stopped having bowel  movement and in fact did not have any till 7/17 until she had a Dulcolax.  -She had multiple loose bowel movements after Dulcolax.  None in last 24 hours.  -No fever, abdominal tenderness or leukocytosis. -Not on treatment for C. difficile.  On Imodium as needed  Morbid obesity - Body mass index is 41.77 kg/m. Patient has been advised to make an attempt to improve diet and exercise patterns to aid in weight loss.   Very large ventral hernia -Uncomplicated. Irreducible. She is planning to follow-up with surgeon once back issues improved.  Mobility: PT/OT evaluation.  Rehab recommended. Code Status:   Code Status: Full Code  Nutritional status: Body mass index is 41.77 kg/m.     Diet Order            Diet regular Room service appropriate? Yes; Fluid consistency: Thin  Diet effective now                 DVT prophylaxis: SCD's Start: 07/14/19 2033 SCDs Start: 07/13/19 2228   Antimicrobials:  Currently on antibiotics Fluid: None  Consultants: Neurosurgery Family Communication:  None  Status is: Inpatient  Remains inpatient appropriate because needs SNF.  Dispo: The patient is from: Home              Anticipated d/c is to: SNF              Anticipated d/c date is: When insurance authorization is available             Patient is medically stable for discharge   Infusions:  . sodium chloride  Scheduled Meds: . acetaminophen  1,000 mg Oral TID  . citalopram  20 mg Oral Daily  . meloxicam  15 mg Oral Daily  . pantoprazole  40 mg Oral Daily  . sodium chloride flush  3 mL Intravenous Q12H    Antimicrobials: Anti-infectives (From admission, onward)   Start     Dose/Rate Route Frequency Ordered Stop   07/15/19 0200  ceFAZolin (ANCEF) IVPB 2g/100 mL premix        2 g 200 mL/hr over 30 Minutes Intravenous Every 8 hours 07/14/19 2032 07/15/19 0953   07/14/19 1703  bacitracin 50,000 Units in sodium chloride 0.9 % 500 mL irrigation  Status:  Discontinued           As needed 07/14/19 1703 07/14/19 1736      PRN meds: albuterol, alum & mag hydroxide-simeth, bisacodyl, menthol-cetylpyridinium **OR** phenol, morphine injection, ondansetron **OR** ondansetron (ZOFRAN) IV, oxyCODONE, oxyCODONE, promethazine, sodium chloride flush, traMADol, zolpidem   Objective: Vitals:   07/22/19 0416 07/22/19 0900  BP: (!) 138/59 (!) 159/65  Pulse: 69 60  Resp: 16 17  Temp: 98.2 F (36.8 C) 98.4 F (36.9 C)  SpO2: 94% 95%   No intake or output data in the 24 hours ending 07/22/19 1100 Filed Weights   07/14/19 1248  Weight: 121 kg   Weight change:  Body mass index is 41.77 kg/m.   Physical Exam: General exam: Appears calm and comfortable.  Morbidly obese.  Not in physical distress Skin: No rashes, lesions or ulcers. HEENT: Atraumatic, normocephalic, supple neck, no obvious bleeding Lungs: Clear to auscultation bilaterally CVS: Regular rate and rhythm, no murmur GI/Abd soft, no tenderness, nondistended, also present CNS: Alert, awake, oriented x3 Psychiatry: Happy mood Extremities: No pedal edema, no calf tenderness  Data Review: I have personally reviewed the laboratory data and studies available.  Recent Labs  Lab 07/21/19 0240  WBC 10.1  NEUTROABS 6.5  HGB 11.1*  HCT 35.4*  MCV 94.1  PLT 236   Recent Labs  Lab 07/21/19 0240  NA 139  K 4.0  CL 107  CO2 23  GLUCOSE 138*  BUN 13  CREATININE 0.70  CALCIUM 9.1    Signed, Terrilee Croak, MD [facility] Pager: [phone] (Secure Chat preferred). 07/22/2019

## 2019-07-22 NOTE — Plan of Care (Signed)
  Problem: Education: Goal: Knowledge of General Education information will improve Description: Including pain rating scale, medication(s)/side effects and non-pharmacologic comfort measures Outcome: Progressing   Problem: Education: Goal: Knowledge of General Education information will improve Description: Including pain rating scale, medication(s)/side effects and non-pharmacologic comfort measures Outcome: Progressing   Problem: Education: Goal: Knowledge of General Education information will improve Description: Including pain rating scale, medication(s)/side effects and non-pharmacologic comfort measures Outcome: Progressing   Problem: Education: Goal: Knowledge of General Education information will improve Description: Including pain rating scale, medication(s)/side effects and non-pharmacologic comfort measures Outcome: Progressing   Problem: Education: Goal: Knowledge of General Education information will improve Description: Including pain rating scale, medication(s)/side effects and non-pharmacologic comfort measures Outcome: Progressing   

## 2019-07-22 NOTE — TOC Progression Note (Signed)
Transition of Care [facility]) - Progression Note    Patient Details  Name: Selena Rodgers MRN: [id number] Date of Birth: 1950/07/14  Transition of Care [facility]) CM/SW Contact  Sharin Mons, RN Phone Number: [phone] 07/22/2019, 9:48 AM  Clinical Narrative:    [facility] accepted and extended bed offer. Pt  Accepted. [facility] admission initiating insurance authorization. TOC team will continue to monitor and follow ....    Expected Discharge Plan: [place] ([facility]) Barriers to Discharge: Insurance Authorization  Expected Discharge Plan and Services Expected Discharge Plan: [place] ([facility])                                               Social Determinants of Health (SDOH) Interventions    Readmission Risk Interventions No flowsheet data found.

## 2019-07-23 ENCOUNTER — Inpatient Hospital Stay (HOSPITAL_COMMUNITY): Payer: Medicare HMO

## 2019-07-23 NOTE — Plan of Care (Signed)

## 2019-07-23 NOTE — Progress Notes (Signed)
Orthopedic Tech Progress Note Patient Details:  STEPHAIE DARDIS Aug 24, 1950 [id number] Called in order to a [facility] for a [place] Patient ID: NILAYA BOUIE, female   DOB: Nov 12, 1950, 69 y.o.   MRN: [id number]   Janit Pagan 07/23/2019, 2:57 PM

## 2019-07-23 NOTE — Progress Notes (Signed)
PROGRESS NOTE  Selena Rodgers  DOB: [birth year]  PCP: [name], MD WCB:[id number]  DOA: 07/13/2019  LOS: 10 days   No chief complaint on file.  Brief narrative: Selena Rodgers is a 69 y.o. femalewith PMH significant for chronic low back pain, spinal stenosis, large ventral hernia, depression/anxiety, GERD and arthritis. She underwent laminectomy by [name] on June 25, 2019 for lumbar spondylolithiasis and severe spinal stenosis. She was discharged to a SNF,  left there AMA after a few days.  She stayed at home for the 7-10 days.  She continued to have worsening weakness of the legs and back pain so came back to the emergency room on 7/11.   In the ED, she underwent MRI lumbar spine which showed a large complex fluid collection in the laminectomy bed from L2-L5 measuring approximately 6.6 x 4.0 x 4.1 cm. There is moderate mass effect on the posterior aspect of the thecal sac and findings worrisome for abscess or CSF leak.  It also showed large complex subcutaneous fluid collection measuring 13.5 cm in length.  She was admitted to hospitalist service.   Subjective: Patient was seen and examined this morning.  Lying on bed.  Not in distress no new symptoms.  Diarrhea has not come back again. Pending insurance authorization for SNF.  Assessment/Plan: Epidural fluid collection with compression of the thecal sac -Presented with weakness and paresthesia of right lower extremity.  -s/p laminectomy of L2-3, L3-4, L4-5. -s/p exploration and drainage of the collection.  Clinically improving.  Working with PT OT. -Pain well controlled on scheduled Tylenol, Mobic and tramadol at night. -Pending SNF placement  Acute diarrhea C. difficile antigen positive, toxin negative -On admission, patient reported loose watery diarrhea for 3 to 4 days.  -Stool sample sent on 7/12 was positive for C. difficile antigen and PCR but negative for toxin.  -Patient stopped having bowel  movement and in fact did not have any till 7/17 until she had a Dulcolax.  -She had multiple loose bowel movements after Dulcolax which self stopped. -No fever, abdominal tenderness or leukocytosis. -Not on treatment for C. difficile. On Imodium as needed.  Morbid obesity - Body mass index is 41.77 kg/m. Patient has been advised to make an attempt to improve diet and exercise patterns to aid in weight loss.   Very large ventral hernia -Uncomplicated. Irreducible. She is planning to follow-up with surgeon once back issues improved.  Mobility: PT/OT evaluation.  Rehab recommended. Code Status:   Code Status: Full Code  Nutritional status: Body mass index is 41.77 kg/m.     Diet Order            Diet regular Room service appropriate? Yes; Fluid consistency: Thin  Diet effective now                 DVT prophylaxis: SCD's Start: 07/14/19 2033 SCDs Start: 07/13/19 2228   Antimicrobials:  Currently on antibiotics Fluid: None  Consultants: Neurosurgery Family Communication:  None  Status is: Inpatient  Remains inpatient appropriate because needs SNF.  Dispo: The patient is from: Home              Anticipated d/c is to: SNF              Anticipated d/c date is: When insurance authorization is available             Patient is medically stable for discharge   Infusions:  . sodium chloride  Scheduled Meds: . acetaminophen  1,000 mg Oral TID  . citalopram  20 mg Oral Daily  . hydrocortisone  25 mg Rectal BID  . meloxicam  15 mg Oral Daily  . pantoprazole  40 mg Oral Daily  . sodium chloride flush  3 mL Intravenous Q12H    Antimicrobials: Anti-infectives (From admission, onward)   Start     Dose/Rate Route Frequency Ordered Stop   07/15/19 0200  ceFAZolin (ANCEF) IVPB 2g/100 mL premix        2 g 200 mL/hr over 30 Minutes Intravenous Every 8 hours 07/14/19 2032 07/15/19 0953   07/14/19 1703  bacitracin 50,000 Units in sodium chloride 0.9 % 500 mL irrigation   Status:  Discontinued          As needed 07/14/19 1703 07/14/19 1736      PRN meds: albuterol, alum & mag hydroxide-simeth, bisacodyl, menthol-cetylpyridinium **OR** phenol, morphine injection, ondansetron **OR** ondansetron (ZOFRAN) IV, oxyCODONE, oxyCODONE, promethazine, sodium chloride flush, traMADol, zolpidem   Objective: Vitals:   07/23/19 0408 07/23/19 0742  BP: (!) 143/57 140/68  Pulse: 64 66  Resp: 17 16  Temp: 98.2 F (36.8 C) 98.6 F (37 C)  SpO2: 96% 97%    Intake/Output Summary (Last 24 hours) at 07/23/2019 1105 Last data filed at 07/23/2019 0900 Gross per 24 hour  Intake 360 ml  Output 700 ml  Net -340 ml   Filed Weights   07/14/19 1248  Weight: 121 kg   Weight change:  Body mass index is 41.77 kg/m.   Physical Exam: General exam: Appears calm and comfortable.  Morbidly obese.  Not in physical distress. Skin: No rashes, lesions or ulcers. HEENT: Atraumatic, normocephalic, supple neck, no obvious bleeding Lungs: Clear to auscultation bilaterally CVS: Regular rate and rhythm, no murmur GI/Abd soft, no tenderness, nondistended, also present CNS: Alert, awake, oriented x3 Psychiatry: Happy mood Extremities: No pedal edema, no calf tenderness  Data Review: I have personally reviewed the laboratory data and studies available.  Recent Labs  Lab 07/21/19 0240  WBC 10.1  NEUTROABS 6.5  HGB 11.1*  HCT 35.4*  MCV 94.1  PLT 236   Recent Labs  Lab 07/21/19 0240  NA 139  K 4.0  CL 107  CO2 23  GLUCOSE 138*  BUN 13  CREATININE 0.70  CALCIUM 9.1    Signed, Terrilee Croak, MD [facility] Pager: [phone] (Secure Chat preferred). 07/23/2019

## 2019-07-23 NOTE — Progress Notes (Addendum)
Physical Therapy Treatment Patient Details Name: Selena Rodgers MRN: [id number] DOB: 05-13-1950 Today's Date: 07/23/2019    History of Present Illness Selena Rodgers is a 69 y.o. female with medical history significant for chronic low back pain, spinal stenosis, large ventral hernia, depression/anxiety, GERD and arthritis.  She underwent laminectomy by [name] on June 23 of this year secondary to lumbar spondylolithiasis and severe spinal stenosis.  She was discharged to a SNF left there AMA after a few days.  She has been at home for the last 9 days.I and D of lumbar wound, evacuation of hematome    PT Comments    Pt is progressing well as evident by increased gt distance.  He R knee remains unstable and she continues to report pain in R knee.  Pt could benefit from ortho consult to address R knee concerns.  Plan for CIR remains appropriate at this time.  Based on CIR denial will update recommendations to snf at this time.  Will inform supervising PT of this need for change in recommendations.      Follow Up Recommendations  SNF     Equipment Recommendations  None recommended by PT    Recommendations for Other Services Rehab consult     Precautions / Restrictions Precautions Precautions: Back;Fall Precaution Booklet Issued: Yes (comment) Precaution Comments: reviewed spinal precautions, log roll technique in and out of bed Required Braces or Orthoses: Spinal Brace (pt reports she does not have to wear it anymore per MD so no brace worn this session.) Restrictions Other Position/Activity Restrictions: Pt has large hernia on L side of abdomen.    Mobility  Bed Mobility Overal bed mobility: Needs Assistance Bed Mobility: Rolling;Sidelying to Sit Rolling: Mod assist;+2 for physical assistance (to the R side of bed.) Sidelying to sit: Mod assist;HOB elevated;+2 for physical assistance       General bed mobility comments: Pt performed rolling to her " bad " side to the R  to come to sitting and prepare for gt training.  Pt followed commands for log rolling, she required assistance to advance LEs to edge of bed and elevate trunk into a seated position. Once in sitting she was able to maintain her balance.  Transfers Overall transfer level: Needs assistance Equipment used: 4-wheeled walker;Bilateral platform walker (sara + with foot plate removed.) Transfers: Sit to/from Stand Sit to Stand: Total assist;+2 physical assistance         General transfer comment: Pt performed sit to stand from edge of bed with sara + sit to stand lift.  Pt required cues for use to maintain upper trunk control.  R knee instability noted in standing.  Ambulation/Gait Ambulation/Gait assistance: Mod assist;+2 physical assistance (w/ close chair follow.) Gait Distance (Feet): 22 Feet Assistive device: 4-wheeled walker;Bilateral platform walker Gait Pattern/deviations: Step-to pattern;Trunk flexed;Decreased stance time - right;Decreased stride length;Decreased dorsiflexion - right;Antalgic     General Gait Details: Pt with poor quad activation on R side. Noted to buckle a few times.  Pt able to maintain standing in frame with +2 for weight shifting and progressing sara + forward inline with patient's steps.  Pt slow and guarded with purposeful steps and close chair follow.   Stairs             Wheelchair Mobility    Modified Rankin (Stroke Patients Only)       Balance  Cognition Arousal/Alertness: Awake/alert Behavior During Therapy: WFL for tasks assessed/performed Overall Cognitive Status: Within Functional Limits for tasks assessed Area of Impairment: Safety/judgement;Problem solving                   Current Attention Level: Focused   Following Commands: Follows multi-step commands with increased time Safety/Judgement: Decreased awareness of safety;Decreased awareness of  deficits Awareness: Emergent Problem Solving: Requires verbal cues General Comments: pt anxious upon arrival with concerns about her diarrhea, but after seeing progress using sara plus, pt in much better spirits      Exercises      General Comments        Pertinent Vitals/Pain Pain Assessment: Faces Faces Pain Scale: Hurts little more Pain Location: back, R knee with extension Pain Descriptors / Indicators: Aching;Discomfort;Sore Pain Intervention(s): Monitored during session;Repositioned    Home Living                      Prior Function            PT Goals (current goals can now be found in the care plan section) Acute Rehab PT Goals Patient Stated Goal: to go to rehab then home Potential to Achieve Goals: Fair Progress towards PT goals: Progressing toward goals    Frequency    Min 3X/week      PT Plan Discharge plan needs to be updated    Co-evaluation              AM-PAC PT "6 Clicks" Mobility   Outcome Measure  Help needed turning from your back to your side while in a flat bed without using bedrails?: A Lot Help needed moving from lying on your back to sitting on the side of a flat bed without using bedrails?: A Lot Help needed moving to and from a bed to a chair (including a wheelchair)?: A Lot Help needed standing up from a chair using your arms (e.g., wheelchair or bedside chair)?: Total Help needed to walk in hospital room?: Total Help needed climbing 3-5 steps with a railing? : Total 6 Click Score: 9    End of Session Equipment Utilized During Treatment: Gait belt Activity Tolerance: Patient tolerated treatment well;Patient limited by fatigue Patient left: with call bell/phone within reach;Other (comment);in chair;with chair alarm set Nurse Communication: Mobility status PT Visit Diagnosis: Other abnormalities of gait and mobility (R26.89);Muscle weakness (generalized) (M62.81)     Time: 1540-0867 PT Time Calculation (min)  (ACUTE ONLY): 29 min  Charges:  $Gait Training: 8-22 mins $Therapeutic Activity: 8-22 mins                     Erasmo Leventhal , PTA Acute Rehabilitation Services Pager [phone] Office [phone]     Arbie Blankley Eli Hose 07/23/2019, 2:07 PM

## 2019-07-23 NOTE — Progress Notes (Signed)
   Providing Compassionate, Quality Care - Together   Subjective: Patient reports no issues overnight. She is anxious to discharge to [facility].  Objective: Vital signs in last 24 hours: Temp:  [98.2 F (36.8 C)-98.6 F (37 C)] 98.6 F (37 C) (07/21 0742) Pulse Rate:  [64-71] 66 (07/21 0742) Resp:  [16-18] 16 (07/21 0742) BP: (140-152)/(49-68) 140/68 (07/21 0742) SpO2:  [96 %-100 %] 97 % (07/21 0742)  Intake/Output from previous day: 07/20 0701 - 07/21 0700 In: -  Out: 700 [Urine:700] Intake/Output this shift: Total I/O In: 360 [P.O.:360] Out: 500 [Urine:500]  Alert and oriented x 4, tearful PERRLA CN II-XII grossly intact MAE Incision is clean, dry, and intact   Lab Results: Recent Labs    07/21/19 0240  WBC 10.1  HGB 11.1*  HCT 35.4*  PLT 236   BMET Recent Labs    07/21/19 0240  NA 139  K 4.0  CL 107  CO2 23  GLUCOSE 138*  BUN 13  CREATININE 0.70  CALCIUM 9.1    Studies/Results: No results found.  Assessment/Plan: She is 9 days status post evacuation of epidural fluid collection. She is slowly progressing. Patient will discharge to [facility] once insurance authorization is complete.   LOS: 10 days    Viona Gilmore, DNP, AGNP-C Nurse Practitioner  [facility] [address], [address], [place], [place] [address] P: [phone]    F: [phone]  07/23/2019, 12:44 PM

## 2019-07-24 NOTE — Progress Notes (Signed)
Occupational Therapy Treatment Patient Details Name: Selena Rodgers MRN: [id number] DOB: 01/28/1950 Today's Date: 07/24/2019    History of present illness Selena Rodgers is a 69 y.o. female with medical history significant for chronic low back pain, spinal stenosis, large ventral hernia, depression/anxiety, GERD and arthritis.  She underwent laminectomy by [name] on June 23 of this year secondary to lumbar spondylolithiasis and severe spinal stenosis.  She was discharged to a SNF left there AMA after a few days.  She has been at home for the last 9 days.I and D of lumbar wound, evacuation of hematome   OT comments  Pt emotional this day . Pt really wants to get home but knows she will need rehab first. Pt declined donning back brace  Follow Up Recommendations  SNF;Supervision/Assistance - 24 hour    Equipment Recommendations  3 in 1 bedside commode;Wheelchair (measurements OT);Wheelchair cushion (measurements OT);Hospital bed       Precautions / Restrictions Precautions Precautions: Back;Fall Precaution Booklet Issued: Yes (comment) Required Braces or Orthoses: Spinal Brace (pt reports she does not have to wear it anymore per MD so no brace worn this session.) Restrictions Other Position/Activity Restrictions: Pt has large hernia on L side of abdomen.       Mobility Bed Mobility          pt inchair        Transfers Overall transfer level: Needs assistance Equipment used: Rolling walker (2 wheeled) Transfers: Sit to/from Stand Sit to Stand: Total assist;+2 physical assistance (did not come to full stand)         General transfer comment: pt did half stand edge of chair in order for CNA to perform hygiene    Balance Overall balance assessment: Needs assistance Sitting-balance support: Feet supported Sitting balance-Leahy Scale: Fair     Standing balance support: Bilateral upper extremity supported;During functional activity Standing balance-Leahy Scale:  Poor Standing balance comment: reliant on external support, standing tolerance ~10 seconds                           ADL either performed or assessed with clinical judgement   ADL Overall ADL's : Needs assistance/impaired                             Toileting- Clothing Manipulation and Hygiene: Total assistance;Sit to/from stand;Cueing for sequencing;Cueing for compensatory techniques;Cueing for safety;+2 for physical assistance;+2 for safety/equipment Toileting - Clothing Manipulation Details (indicate cue type and reason): sit to stand from chair for hygiene. 2 person A from chair for sit to stand       General ADL Comments: pt very emotional this day crying on and off during OT session.     Vision Baseline Vision/History: Wears glasses Wears Glasses: At all times Patient Visual Report: No change from baseline            Cognition Arousal/Alertness: Awake/alert Behavior During Therapy: WFL for tasks assessed/performed Overall Cognitive Status: Within Functional Limits for tasks assessed Area of Impairment: Safety/judgement;Problem solving                   Current Attention Level: Focused   Following Commands: Follows multi-step commands with increased time Safety/Judgement: Decreased awareness of safety;Decreased awareness of deficits Awareness: Emergent Problem Solving: Requires verbal cues                     Pertinent  Vitals/ Pain       Pain Assessment: Faces Pain Score: 3  Pain Location: back, R knee with extension Pain Descriptors / Indicators: Aching;Discomfort;Sore         Frequency  Min 2X/week        Progress Toward Goals  OT Goals(current goals can now be found in the care plan section)  Progress towards OT goals: Progressing toward goals  Acute Rehab OT Goals Patient Stated Goal: to go to rehab then home OT Goal Formulation: With patient Time For Goal Achievement: 07/29/19 Potential to Achieve Goals:  Good  Plan Discharge plan needs to be updated       AM-PAC OT "6 Clicks" Daily Activity     Outcome Measure   Help from another person eating meals?: None Help from another person taking care of personal grooming?: A Little Help from another person toileting, which includes using toliet, bedpan, or urinal?: Total Help from another person bathing (including washing, rinsing, drying)?: A Lot Help from another person to put on and taking off regular upper body clothing?: A Little Help from another person to put on and taking off regular lower body clothing?: Total 6 Click Score: 14    End of Session Equipment Utilized During Treatment: Back brace  OT Visit Diagnosis: Unsteadiness on feet (R26.81);Other abnormalities of gait and mobility (R26.89);Muscle weakness (generalized) (M62.81);Repeated falls (R29.6);Pain   Activity Tolerance Patient tolerated treatment well   Patient Left in chair;with call bell/phone within reach   Nurse Communication Need for lift equipment        Time: 6387-5643 OT Time Calculation (min): 28 min  Charges: OT General Charges $OT Visit: 1 Visit OT Treatments $Self Care/Home Management : 23-37 mins  Kari Baars, [place] [phone] Office- [phone], Edwena Felty D 07/24/2019, 4:34 PM

## 2019-07-24 NOTE — Plan of Care (Signed)
  Problem: Education: Goal: Knowledge of General Education information will improve Description Including pain rating scale, medication(s)/side effects and non-pharmacologic comfort measures Outcome: Progressing   

## 2019-07-24 NOTE — Progress Notes (Addendum)
PROGRESS NOTE  Selena Rodgers  DOB: Jan 16, 1950  PCP: [name], MD OFB:[id number]  DOA: 07/13/2019  LOS: 11 days   No chief complaint on file.  Brief narrative: Selena Rodgers is a 69 y.o. femalewith PMH significant for chronic low back pain, spinal stenosis, large ventral hernia, depression/anxiety, GERD and arthritis. She underwent laminectomy by [name] on June 25, 2019 for lumbar spondylolithiasis and severe spinal stenosis. She was discharged to a SNF,  left there AMA after a few days.  She stayed at home for the 7-10 days.  She continued to have worsening weakness of the legs and back pain so came back to the emergency room on 7/11.   In the ED, she underwent MRI lumbar spine which showed a large complex fluid collection in the laminectomy bed from L2-L5 measuring approximately 6.6 x 4.0 x 4.1 cm. There is moderate mass effect on the posterior aspect of the thecal sac and findings worrisome for abscess or CSF leak.  It also showed large complex subcutaneous fluid collection measuring 13.5 cm in length.  She was admitted to hospitalist service.   Subjective: Patient was seen and examined this morning.  Sitting up in chair.  Not in distress.  No new symptoms.  Waiting for SNF placement.  Assessment/Plan: Epidural fluid collection with compression of the thecal sac -Presented with weakness and paresthesia of right lower extremity.  -s/p laminectomy of L2-3, L3-4, L4-5. -s/p exploration and drainage of the collection.  Clinically improving.  Working with PT OT.  -Pain well controlled on scheduled Tylenol, Mobic and tramadol at night. -Pending SNF placement  Acute diarrhea C. difficile antigen positive, toxin negative -On admission, patient reported loose watery diarrhea for 3 to 4 days.  -Stool sample sent on 7/12 was positive for C. difficile antigen and PCR but negative for toxin.  -Patient stopped having bowel movement and in fact did not have any till  7/17 until she had a Dulcolax.  -She had multiple loose bowel movements after Dulcolax which self stopped. -No fever, abdominal tenderness or leukocytosis. -Not on treatment for C. difficile. On Imodium as needed.  Both selection. -While working with physical therapy, patient has been complaining of right knee pain.  Discussed with orthopedic surgery.  X-ray did not show any fracture or dislocation.  Hinged brace has been applied.  Morbid obesity - Body mass index is 41.77 kg/m. Patient has been advised to make an attempt to improve diet and exercise patterns to aid in weight loss.   Very large ventral hernia -Uncomplicated. Irreducible. She is planning to follow-up with surgeon once back issues improved.  Mobility: PT/OT evaluation.  Rehab recommended. Code Status:   Code Status: Full Code  Nutritional status: Body mass index is 41.77 kg/m.     Diet Order            Diet regular Room service appropriate? Yes; Fluid consistency: Thin  Diet effective now                 DVT prophylaxis: SCD's Start: 07/14/19 2033 SCDs Start: 07/13/19 2228   Antimicrobials:  Currently on antibiotics Fluid: None  Consultants: Neurosurgery Family Communication:  None  Status is: Inpatient  Remains inpatient appropriate because needs SNF.  Dispo: The patient is from: Home              Anticipated d/c is to: SNF              Anticipated d/c date is: When insurance  authorization is available             Patient is medically stable for discharge   Infusions:  . sodium chloride      Scheduled Meds: . acetaminophen  1,000 mg Oral TID  . citalopram  20 mg Oral Daily  . hydrocortisone  25 mg Rectal BID  . meloxicam  15 mg Oral Daily  . pantoprazole  40 mg Oral Daily  . sodium chloride flush  3 mL Intravenous Q12H    Antimicrobials: Anti-infectives (From admission, onward)   Start     Dose/Rate Route Frequency Ordered Stop   07/15/19 0200  ceFAZolin (ANCEF) IVPB 2g/100 mL premix         2 g 200 mL/hr over 30 Minutes Intravenous Every 8 hours 07/14/19 2032 07/15/19 0953   07/14/19 1703  bacitracin 50,000 Units in sodium chloride 0.9 % 500 mL irrigation  Status:  Discontinued          As needed 07/14/19 1703 07/14/19 1736      PRN meds: albuterol, alum & mag hydroxide-simeth, bisacodyl, menthol-cetylpyridinium **OR** phenol, morphine injection, ondansetron **OR** ondansetron (ZOFRAN) IV, oxyCODONE, oxyCODONE, promethazine, sodium chloride flush, traMADol, zolpidem   Objective: Vitals:   07/24/19 0213 07/24/19 0744  BP: (!) 142/36 (!) 154/69  Pulse: 65 73  Resp: 17 18  Temp: 98 F (36.7 C) 98.4 F (36.9 C)  SpO2: 98% 93%    Intake/Output Summary (Last 24 hours) at 07/24/2019 1309 Last data filed at 07/24/2019 0900 Gross per 24 hour  Intake 960 ml  Output --  Net 960 ml   Filed Weights   07/14/19 1248  Weight: 121 kg   Weight change:  Body mass index is 41.77 kg/m.   Physical Exam: General exam: Appears calm and comfortable. Morbidly obese. Not in physical distress. Skin: No rashes, lesions or ulcers. HEENT: Atraumatic, normocephalic, supple neck, no obvious bleeding Lungs: Clear to auscultation bilaterally. CVS: Regular rate and rhythm, no murmur GI/Abd soft, no tenderness, nondistended, also present CNS: Alert, awake, oriented x3 Psychiatry: Mood appropriate Extremities: No pedal edema, no calf tenderness  Data Review: I have personally reviewed the laboratory data and studies available.  Recent Labs  Lab 07/21/19 0240  WBC 10.1  NEUTROABS 6.5  HGB 11.1*  HCT 35.4*  MCV 94.1  PLT 236   Recent Labs  Lab 07/21/19 0240  NA 139  K 4.0  CL 107  CO2 23  GLUCOSE 138*  BUN 13  CREATININE 0.70  CALCIUM 9.1    Signed, Terrilee Croak, MD [facility] Pager: [phone] (Secure Chat preferred). 07/24/2019

## 2019-07-25 DIAGNOSIS — S064X9A Epidural hemorrhage with loss of consciousness of unspecified duration, initial encounter: Secondary | ICD-10-CM

## 2019-07-25 DIAGNOSIS — S064XAA Epidural hemorrhage with loss of consciousness status unknown, initial encounter: Secondary | ICD-10-CM

## 2019-07-25 LAB — SARS CORONAVIRUS 2 (TAT 6-24 HRS): SARS Coronavirus 2: NEGATIVE

## 2019-07-25 MED ORDER — ACETAMINOPHEN 500 MG PO TABS: 1000.0000 mg | ORAL_TABLET | Freq: Three times a day (TID) | ORAL | 0 refills | Status: AC

## 2019-07-25 MED ORDER — OXYCODONE HCL 5 MG PO TABS
5.0000 mg | ORAL_TABLET | ORAL | Status: DC | PRN
  Administered 2019-07-25: 5 mg via ORAL
  Filled 2019-07-25: qty 1

## 2019-07-25 MED ORDER — OXYCODONE HCL 5 MG PO TABS: 5.0000 mg | ORAL_TABLET | ORAL | 0 refills | Status: DC | PRN

## 2019-07-25 MED ORDER — CAPSAICIN 0.025 % EX CREA
TOPICAL_CREAM | Freq: Two times a day (BID) | CUTANEOUS | Status: DC
  Filled 2019-07-25: qty 60

## 2019-07-25 MED ORDER — CITALOPRAM HYDROBROMIDE 20 MG PO TABS: 20.0000 mg | ORAL_TABLET | Freq: Every day | ORAL | Status: DC

## 2019-07-25 NOTE — Plan of Care (Signed)
  Problem: Safety: Goal: Ability to remain free from injury will improve Outcome: Progressing   

## 2019-07-25 NOTE — Assessment & Plan Note (Signed)
-   s/p laminectomy on 6/23; left AMA from rehab and returned with leg/back pain on 7/11; now is s/p exploration of the lumbar wound and evacuation of the epidural hematoma on 07/14/19.  - await discharge back to SNF - recovering steadily

## 2019-07-25 NOTE — Progress Notes (Signed)
PROGRESS NOTE    Selena Rodgers   DVV:[id number]  DOB: 1950/11/05  DOA: 07/13/2019     12  PCP: [name], MD  CC: back pain  Hospital Course: Selena Rodgers is a 69 y.o. female with PMH significant for chronic low back pain, spinal stenosis, large ventral hernia, depression/anxiety, GERD and arthritis.  She underwent laminectomy by [name] on June 25, 2019 for lumbar spondylolithiasis and severe spinal stenosis.  She was discharged to a SNF,  left there AMA after a few days.   She stayed at home for about 7-10 days following this.  She continued to have worsening weakness of the legs and back pain so came back to the emergency room on 7/11.   In the ED, she underwent MRI lumbar spine which showed a large complex fluid collection in the laminectomy bed from L2-L5 measuring approximately 6.6 x 4.0 x 4.1 cm. There is moderate mass effect on the posterior aspect of the thecal sac and findings worrisome for abscess or CSF leak.  It also showed large complex subcutaneous fluid collection measuring 13.5 cm in length. She was admitted and underwent exploration of the lumbar wound and evacuation of the epidural hematoma on 07/14/19.  She was evaluated by PT/OT with recommendations for SNF at time of discharge.     Interval History:  No events overnight.  She is stating that her back pain has improved since admission.  Does endorse some difficulty getting assistance going to the bathroom in time.  She is ready to be discharged when a bed is available.  Old records reviewed in assessment of this patient  ROS: Constitutional: negative for chills, fatigue and fevers, Respiratory: negative for cough and pleurisy/chest pain, Cardiovascular: negative for chest pain and Gastrointestinal: negative for abdominal pain  Assessment & Plan: Lumbar surgical wound fluid collection - see epidural hematoma  Epidural hematoma (HCC) - s/p laminectomy on 6/23; left AMA from rehab and returned  with leg/back pain on 7/11; now is s/p exploration of the lumbar wound and evacuation of the epidural hematoma on 07/14/19.  - await discharge back to SNF - recovering steadily    Antimicrobials: None  DVT prophylaxis: SCD Code Status: Full Family Communication: None present Disposition Plan:  Status is: Inpatient  Remains inpatient appropriate because:Unsafe d/c plan and Inpatient level of care appropriate due to severity of illness   Dispo: The patient is from: Home              Anticipated d/c is to: SNF              Anticipated d/c date is: 1 day              Patient currently is not medically stable to d/c.       Objective: Blood pressure (!) 148/70, pulse 67, temperature 98 F (36.7 C), temperature source Oral, resp. rate 17, height 5' 7.01" (1.702 m), weight 121 kg, SpO2 93 %.  Examination: General appearance: alert, cooperative and no distress Head: Normocephalic, without obvious abnormality, atraumatic Eyes: EOMI Back: lower lumbar sutures in place with no surrounding signs of infection; minimal TTP over surgical site Lungs: clear to auscultation bilaterally Heart: regular rate and rhythm and S1, S2 normal Abdomen: normal findings: bowel sounds normal and soft, non-tender Extremities: no edema Skin: Skin color, texture, turgor normal. No rashes or lesions Neurologic: Grossly normal  Consultants:   Neurosurgery  Data Reviewed: I have personally reviewed following labs and imaging studies No  results found for this or any previous visit (from the past 24 hour(s)).  No results found for this or any previous visit (from the past 240 hour(s)).   Radiology Studies: DG Knee 1-2 Views Right  Result Date: 07/23/2019 CLINICAL DATA:  Right knee pain, fall EXAM: RIGHT KNEE - 1-2 VIEW COMPARISON:  None. FINDINGS: Two view radiograph right knee demonstrates normal alignment. No fracture or dislocation. There is mild tricompartmental degenerative arthritis of the right  knee. No effusion. Soft tissues are unremarkable. IMPRESSION: No fracture or dislocation. Electronically Signed   By: Fidela Salisbury MD   On: 07/23/2019 20:56   DG Knee 1-2 Views Right  Final Result       Scheduled Meds: . acetaminophen  1,000 mg Oral TID  . citalopram  20 mg Oral Daily  . meloxicam  15 mg Oral Daily  . pantoprazole  40 mg Oral Daily   PRN Meds: albuterol, alum & mag hydroxide-simeth, bisacodyl, menthol-cetylpyridinium **OR** phenol, ondansetron **OR** ondansetron (ZOFRAN) IV, oxyCODONE, promethazine, zolpidem Continuous Infusions:    LOS: 12 days  Time spent: Greater than 50% of the 35 minute visit was spent in counseling/coordination of care for the patient as laid out in the A&P.   Dwyane Dee, MD [facility] 07/25/2019, 1:16 PM   Contact via secure chat.  To contact the attending provider between 7A-7P or the covering provider during after hours 7P-7A, please log into the web site [email or web address] and access using universal [place] password for that web site. If you do not have the password, please call the hospital operator.

## 2019-07-25 NOTE — Discharge Summary (Addendum)
Physician Discharge Summary  Selena Rodgers AVW:[id number] DOB: March 14, 1950 DOA: 07/13/2019  PCP: [name], MD  Admit date: 07/13/2019 Discharge date: 07/28/2019  Admitted From: home Disposition:  SNF Discharging physician: Dwyane Dee, MD  Recommendations for Outpatient Follow-up:  1. Follow up with neurosurgery   Patient discharged to SNF in Discharge Condition: stable CODE STATUS: Full Diet recommendation:  Diet Orders (From admission, onward)    Start     Ordered   07/25/19 0000  Diet - low sodium heart healthy        07/25/19 1156   07/15/19 0640  Diet regular Room service appropriate? Yes; Fluid consistency: Thin  Diet effective now       Question Answer Comment  Room service appropriate? Yes   Fluid consistency: Thin      07/15/19 0640          Hospital Course: Selena Rodgers is a 69 y.o. female with PMH significant for chronic low back pain, spinal stenosis, large ventral hernia, depression/anxiety, GERD and arthritis.  She underwent laminectomy by [name] on June 25, 2019 for lumbar spondylolithiasis and severe spinal stenosis.  She was discharged to a SNF,  left there AMA after a few days.   She stayed at home for about 7-10 days following this.  She continued to have worsening weakness of the legs and back pain so came back to the emergency room on 7/11.   In the ED, she underwent MRI lumbar spine which showed a large complex fluid collection in the laminectomy bed from L2-L5 measuring approximately 6.6 x 4.0 x 4.1 cm. There is moderate mass effect on the posterior aspect of the thecal sac and findings worrisome for abscess or CSF leak.  It also showed large complex subcutaneous fluid collection measuring 13.5 cm in length. She was admitted and underwent exploration of the lumbar wound and evacuation of the epidural hematoma on 07/14/19.  She was evaluated by PT/OT with recommendations for SNF at time of discharge.   On 07/25/2019, she appeared  significantly depressed and was voicing ideations/thoughts that she did not want to continue living.  Psychiatry was consulted and after evaluation was not considered to be a harm to herself nor in need of IVC.  Part of her depression was considered situational/due to adjustment.  Coping techniques were discussed.  Her Celexa was increased to 30 mg daily.  On 7/25 she had mild swelling of her lower lip; no new meds, the only associated change the patient could think of was rosemary porkchops she ate just before and has never had rosemary before. She was given a dose of benadryl with good response. She otherwise had no other systemic signs of allergic reaction and improved.   She otherwise remained stable and on 07/28/2019 had received notice that bed was available at her rehab facility and she was discharged in stable condition.   Lumbar surgical wound fluid collection - see epidural hematoma  Epidural hematoma (HCC) - s/p laminectomy on 6/23; left AMA from rehab and returned with leg/back pain on 7/11; now is s/p exploration of the lumbar wound and evacuation of the epidural hematoma on 07/14/19.  - await discharge back to SNF - recovering steadily   Adjustment disorder with mixed anxiety and depressed mood -Evaluated by psychiatry on 07/26/2019.  Appreciate assistance -Patient not a harm to herself or others; no need for IVC.  Much of her depression is situational from her prolonged recovery and decreased physical status -Celexa has been increased  to 30 mg daily per psychiatry  Lip swelling - on late afternoon of 7/25, lower lip swelling; no new meds; only recent change was increase of celexa from 20 to 30 mg after seen by psych -patient endorsed this occurred after eating rosemary porkchop which she's never had rosemary - responded well to benadryl and resolved with no recurrence; continue to monitor for any other signs/symptoms   Discharge Diagnoses:   Principal Diagnosis: Adjustment  disorder with mixed anxiety and depressed mood  Active Hospital Problems   Diagnosis Date Noted  . Adjustment disorder with mixed anxiety and depressed mood   . Lip swelling 07/28/2019  . Current tobacco use 10/06/2014  . Spinal stenosis 09/17/2014  . Morbid obesity ([place]) 06/30/2014    Resolved Hospital Problems   Diagnosis Date Noted Date Resolved  . Epidural hematoma ([place]) 07/25/2019 07/25/2019    Priority: High  . Lumbar surgical wound fluid collection 07/13/2019 07/25/2019  . Right leg weakness 07/13/2019 07/25/2019  . Diarrhea 07/13/2019 07/25/2019  . Paresthesia of lower extremity 07/13/2019 07/25/2019  . Paresthesia of lower limb 07/13/2019 07/25/2019    Discharge Instructions    Diet - low sodium heart healthy   Complete by: As directed    Discharge wound care:   Complete by: As directed    Follow up with neurosurgery   Increase activity slowly   Complete by: As directed    Increase activity slowly   Complete by: As directed    Leave dressing on - Keep it clean, dry, and intact until clinic visit   Complete by: As directed      Allergies as of 07/28/2019      Reactions   Chlorpheniramine Anaphylaxis   Linseed Oil Anaphylaxis   Codeine Hives   Flax Seed Oil [bio-flax] Swelling   UNSPECIFIED    Latex Hives, Other (See Comments)   BLISTERS   Tape Other (See Comments)   BLISTERS   Tapentadol Other (See Comments)   BLISTERS   2,4-d Dimethylamine (amisol) Rash   T-DAP   Aspirin Rash     Allergies as of 07/28/2019      Reactions   Chlorpheniramine Anaphylaxis   Linseed Oil Anaphylaxis   Codeine Hives   Flax Seed Oil [bio-flax] Swelling   UNSPECIFIED    Latex Hives, Other (See Comments)   BLISTERS   Tape Other (See Comments)   BLISTERS   Tapentadol Other (See Comments)   BLISTERS   2,4-d Dimethylamine (amisol) Rash   T-DAP   Aspirin Rash      Medication List    STOP taking these medications   acetaminophen 650 MG CR tablet Commonly known as:  TYLENOL Replaced by: acetaminophen 500 MG tablet   cyclobenzaprine 10 MG tablet Commonly known as: FLEXERIL   docusate sodium 100 MG capsule Commonly known as: COLACE     TAKE these medications   acetaminophen 500 MG tablet Commonly known as: TYLENOL Take 2 tablets (1,000 mg total) by mouth 3 (three) times daily. Replaces: acetaminophen 650 MG CR tablet   citalopram 10 MG tablet Commonly known as: CELEXA Take 3 tablets (30 mg total) by mouth daily. Start taking on: July 29, 2019 What changed:   medication strength  how much to take   meloxicam 15 MG tablet Commonly known as: MOBIC Take 15 mg by mouth daily.   omeprazole 40 MG capsule Commonly known as: PRILOSEC Take 40 mg by mouth daily.   sodium chloride 0.65 % Soln nasal spray Commonly known as: OCEAN  Place 1 spray into both nostrils daily as needed for congestion (dry nose).   traMADol 50 MG tablet Commonly known as: ULTRAM Take 2 tablets (100 mg total) by mouth at bedtime as needed for severe pain. What changed:   how much to take  when to take this  reasons to take this   vitamin B-12 1000 MCG tablet Commonly known as: CYANOCOBALAMIN Take 1,000 mcg by mouth daily.            Discharge Care Instructions  (From admission, onward)         Start     Ordered   07/28/19 0000  Leave dressing on - Keep it clean, dry, and intact until clinic visit        07/28/19 1451   07/25/19 0000  Discharge wound care:       Comments: Follow up with neurosurgery   07/25/19 1156                 Discharge Care Instructions  (From admission, onward)         Start     Ordered   07/28/19 0000  Leave dressing on - Keep it clean, dry, and intact until clinic visit        07/28/19 1451   07/25/19 0000  Discharge wound care:       Comments: Follow up with neurosurgery   07/25/19 1156          Contact information for after-discharge care    Destination    [facility] Preferred  SNF .   Service: Skilled Nursing Contact information: [place] [phone]                 Allergies  Allergen Reactions  . Chlorpheniramine Anaphylaxis  . Linseed Oil Anaphylaxis  . Codeine Hives  . Flax Seed Oil [Bio-Flax] Swelling    UNSPECIFIED   . Latex Hives and Other (See Comments)    BLISTERS  . Tape Other (See Comments)    BLISTERS  . Tapentadol Other (See Comments)    BLISTERS  . 2,4-D Dimethylamine (Amisol) Rash    T-DAP  . Aspirin Rash     Procedures/Studies: DG Knee 1-2 Views Right  Result Date: 07/23/2019 CLINICAL DATA:  Right knee pain, fall EXAM: RIGHT KNEE - 1-2 VIEW COMPARISON:  None. FINDINGS: Two view radiograph right knee demonstrates normal alignment. No fracture or dislocation. There is mild tricompartmental degenerative arthritis of the right knee. No effusion. Soft tissues are unremarkable. IMPRESSION: No fracture or dislocation. Electronically Signed   By: Fidela Salisbury MD   On: 07/23/2019 20:56   MR THORACIC SPINE WO CONTRAST  Result Date: 07/13/2019 CLINICAL DATA:  Back pain.  History of recent lumbar fusion. EXAM: MRI THORACIC SPINE WITHOUT CONTRAST TECHNIQUE: Multiplanar, multisequence MR imaging of the thoracic spine was performed. No intravenous contrast was administered. COMPARISON:  None. FINDINGS: Alignment:  Normal Vertebrae: No bone lesions or fractures. No findings suspicious for discitis or osteomyelitis. Cord: Grossly normal cord signal intensity. No cord lesions or syrinx. Paraspinal and other soft tissues: No significant paraspinal or retroperitoneal findings. Disc levels: Small scattered thoracic disc protrusions but no significant disc protrusions, spinal or foraminal stenosis. IMPRESSION: 1. No significant thoracic disc protrusions, spinal or foraminal stenosis. 2. No findings suspicious for discitis or osteomyelitis. 3. Normal appearance of the thoracic spinal cord. Electronically Signed   By:  Marijo Sanes M.D.   On: 07/13/2019 14:02  MR LUMBAR SPINE WO CONTRAST  Result Date: 07/13/2019 CLINICAL DATA:  History of lumbar fusion approximately 3 weeks ago. Patient with back pain. EXAM: MRI LUMBAR SPINE WITHOUT CONTRAST TECHNIQUE: Multiplanar, multisequence MR imaging of the lumbar spine was performed. No intravenous contrast was administered. COMPARISON:  Lumbar spine MRI 06/03/2019 FINDINGS: Segmentation: There are five lumbar type vertebral bodies. The last full intervertebral disc space is labeled L5-S1. L2-L5 fusion hardware noted. Alignment:  Normal overall alignment. Vertebrae: Significant artifact from the hardware but no obvious acute bony findings. Conus medullaris and cauda equina: Conus extends to the L1-2 level. Conus and cauda equina appear normal. Paraspinal and other soft tissues: There is a large complex fluid collection in the laminectomy bed from L2-L5. It measures approximately 6.6 x 4.0 x 4.1 cm. No significant increased T1 signal intensity to suggest this is a hematoma. There is moderate mass effect on the posterior aspect of the thecal sac and findings worrisome for abscess or CSF leak. There is also a large complex fluid collection in the subcutaneous fat measuring 13.5 cm in length. Disc levels: I do not see any findings suspicious for discitis or osteomyelitis without contrast. There is fairly significant mass effect on the thecal sac from L2-3 down to L4-5. IMPRESSION: 1. Postoperative changes with L2-L5 fusion hardware. 2. Large complex fluid collection in the laminectomy bed from L2-L5. This measures approximately 6.6 x 4.0 x 4.1 cm. There is moderate mass effect on the posterior aspect of the thecal sac and findings worrisome for abscess or CSF leak. Recommend surgical consultation. 3. Large complex subcutaneous fluid collection measuring 13.5 cm in length. 4. No findings suspicious for discitis or osteomyelitis without contrast. Electronically Signed   By: Marijo Sanes  M.D.   On: 07/13/2019 14:01    Discharge Exam: BP (!) 139/56 (BP Location: Right Arm)   Pulse 88   Temp 98.9 F (37.2 C) (Oral)   Resp 17   Ht 5' 7.01" (1.702 m)   Wt 121 kg   SpO2 96%   BMI 41.77 kg/m  General appearance: alert, cooperative and no distress Head: Normocephalic, without obvious abnormality, atraumatic Eyes: EOMI Back: lower lumbar sutures in place with no surrounding signs of infection; minimal TTP over surgical site Lungs: clear to auscultation bilaterally Heart: regular rate and rhythm and S1, S2 normal Abdomen: normal findings: bowel sounds normal and soft, non-tender Extremities: no edema Skin: Skin color, texture, turgor normal. No rashes or lesions Neurologic: Grossly normal   The results of significant diagnostics from this hospitalization (including imaging, microbiology, ancillary and laboratory) are listed below for reference.     Microbiology: Recent Results (from the past 240 hour(s))  SARS CORONAVIRUS 2 (TAT 6-24 HRS) Nasopharyngeal Nasopharyngeal Swab     Status: None   Collection Time: 07/25/19 12:15 PM   Specimen: Nasopharyngeal Swab  Result Value Ref Range Status   SARS Coronavirus 2 NEGATIVE NEGATIVE Final    Comment: (NOTE) SARS-CoV-2 target nucleic acids are NOT DETECTED.  The SARS-CoV-2 RNA is generally detectable in upper and lower respiratory specimens during the acute phase of infection. Negative results do not preclude SARS-CoV-2 infection, do not rule out co-infections with other pathogens, and should not be used as the sole basis for treatment or other patient management decisions. Negative results must be combined with clinical observations, patient history, and epidemiological information. The expected result is Negative.  Fact Sheet for Patients: SugarRoll.be  Fact Sheet for Healthcare Providers: https://www.woods-mathews.com/  This test is not yet approved or  cleared by the  [place] and  has been authorized for detection and/or diagnosis of SARS-CoV-2 by FDA under an Emergency Use Authorization (EUA). This EUA will remain  in effect (meaning this test can be used) for the duration of the COVID-19 declaration under Se ction 564(b)(1) of the Act, 21 U.S.C. section 360bbb-3(b)(1), unless the authorization is terminated or revoked sooner.  Performed at [facility], [place] [address] [place], [place] [address]      Labs: BNP (last 3 results) No results for input(s): BNP in the last 8760 hours. Basic Metabolic Panel: Recent Labs  Lab 07/26/19 0314 07/27/19 0253  NA 136 136  K 5.0 4.6  CL 103 103  CO2 25 25  GLUCOSE 138* 127*  BUN 20 18  CREATININE 0.81 0.66  CALCIUM 9.1 9.0  MG 2.5* 2.2   Liver Function Tests: No results for input(s): AST, ALT, ALKPHOS, BILITOT, PROT, ALBUMIN in the last 168 hours. No results for input(s): LIPASE, AMYLASE in the last 168 hours. No results for input(s): AMMONIA in the last 168 hours. CBC: Recent Labs  Lab 07/26/19 0314 07/27/19 0253  WBC 9.8 8.0  NEUTROABS 5.9 4.8  HGB 11.1* 11.0*  HCT 34.7* 35.4*  MCV 93.8 92.9  PLT 220 217   Cardiac Enzymes: No results for input(s): CKTOTAL, CKMB, CKMBINDEX, TROPONINI in the last 168 hours. BNP: Invalid input(s): POCBNP CBG: No results for input(s): GLUCAP in the last 168 hours. D-Dimer No results for input(s): DDIMER in the last 72 hours. Hgb A1c No results for input(s): HGBA1C in the last 72 hours. Lipid Profile No results for input(s): CHOL, HDL, LDLCALC, TRIG, CHOLHDL, LDLDIRECT in the last 72 hours. Thyroid function studies No results for input(s): TSH, T4TOTAL, T3FREE, THYROIDAB in the last 72 hours.  Invalid input(s): FREET3 Anemia work up No results for input(s): VITAMINB12, FOLATE, FERRITIN, TIBC, IRON, RETICCTPCT in the last 72 hours. Urinalysis    Component Value Date/Time   COLORURINE Yellow 12/27/2013 1240   APPEARANCEUR Turbid  12/27/2013 1240   LABSPEC 1.026 12/27/2013 1240   PHURINE 5.0 12/27/2013 1240   GLUCOSEU Negative 12/27/2013 1240   HGBUR 2+ 12/27/2013 1240   BILIRUBINUR Negative 12/27/2013 1240   KETONESUR Negative 12/27/2013 1240   PROTEINUR 30 mg/dL 12/27/2013 1240   NITRITE Negative 12/27/2013 1240   LEUKOCYTESUR Negative 12/27/2013 1240   Sepsis Labs Invalid input(s): PROCALCITONIN,  WBC,  LACTICIDVEN Microbiology Recent Results (from the past 240 hour(s))  SARS CORONAVIRUS 2 (TAT 6-24 HRS) Nasopharyngeal Nasopharyngeal Swab     Status: None   Collection Time: 07/25/19 12:15 PM   Specimen: Nasopharyngeal Swab  Result Value Ref Range Status   SARS Coronavirus 2 NEGATIVE NEGATIVE Final    Comment: (NOTE) SARS-CoV-2 target nucleic acids are NOT DETECTED.  The SARS-CoV-2 RNA is generally detectable in upper and lower respiratory specimens during the acute phase of infection. Negative results do not preclude SARS-CoV-2 infection, do not rule out co-infections with other pathogens, and should not be used as the sole basis for treatment or other patient management decisions. Negative results must be combined with clinical observations, patient history, and epidemiological information. The expected result is Negative.  Fact Sheet for Patients: SugarRoll.be  Fact Sheet for Healthcare Providers: https://www.woods-mathews.com/  This test is not yet approved or cleared by the [place] FDA and  has been authorized for detection and/or diagnosis of SARS-CoV-2 by FDA under an Emergency Use Authorization (EUA). This EUA will remain  in effect (meaning  this test can be used) for the duration of the COVID-19 declaration under Se ction 564(b)(1) of the Act, 21 U.S.C. section 360bbb-3(b)(1), unless the authorization is terminated or revoked sooner.  Performed at [facility], [place] [address] [place], [place] [address]      Time coordinating  discharge: Over 46 minutes    Dwyane Dee, MD  [facility] 07/28/2019, 2:56 PM Pager: Secure chat  If 7PM-7AM, please contact night-coverage [email or web address] Password [email or web address]

## 2019-07-25 NOTE — TOC Progression Note (Signed)
Transition of Care [facility]) - Progression Note    Patient Details  Name: Selena Rodgers MRN: [id number] Date of Birth: [birth year]  Transition of Care [facility]) CM/SW [place], [place] Phone Number: 07/25/2019, 11:24 AM  Clinical Narrative:    CSW followed up with [facility]. [facility] has received insurance auth for pt.  CSW will continue to follow for disposition planning.   Expected Discharge Plan: [place] ([facility] SNF) Barriers to Discharge: Insurance Authorization  Expected Discharge Plan and Services Expected Discharge Plan: [place] ([facility] SN[facility])                                               Social Determinants of Health (SDOH) Interventions    Readmission Risk Interventions No flowsheet data found.

## 2019-07-25 NOTE — Hospital Course (Addendum)
Selena Rodgers is a 69 y.o. female with PMH significant for chronic low back pain, spinal stenosis, large ventral hernia, depression/anxiety, GERD and arthritis.  She underwent laminectomy by [name] on June 25, 2019 for lumbar spondylolithiasis and severe spinal stenosis.  She was discharged to a SNF,  left there AMA after a few days.   She stayed at home for about 7-10 days following this.  She continued to have worsening weakness of the legs and back pain so came back to the emergency room on 7/11.   In the ED, she underwent MRI lumbar spine which showed a large complex fluid collection in the laminectomy bed from L2-L5 measuring approximately 6.6 x 4.0 x 4.1 cm. There is moderate mass effect on the posterior aspect of the thecal sac and findings worrisome for abscess or CSF leak.  It also showed large complex subcutaneous fluid collection measuring 13.5 cm in length. She was admitted and underwent exploration of the lumbar wound and evacuation of the epidural hematoma on 07/14/19.  She was evaluated by PT/OT with recommendations for SNF at time of discharge.   On 07/25/2019, she appeared significantly depressed and was voicing ideations/thoughts that she did not want to continue living.  Psychiatry was consulted and after evaluation was not considered to be a harm to herself nor in need of IVC.  Part of her depression was considered situational/due to adjustment.  Coping techniques were discussed.  Her Celexa was increased to 30 mg daily.  On 7/25 she had mild swelling of her lower lip; no new meds, the only associated change the patient could think of was rosemary porkchops she ate just before and has never had rosemary before. She was given a dose of benadryl with good response. She otherwise had no other systemic signs of allergic reaction and improved.   She otherwise remained stable and on 07/28/2019 had received notice that bed was available at her rehab facility and she was discharged in  stable condition.

## 2019-07-25 NOTE — Assessment & Plan Note (Signed)
-   see epidural hematoma

## 2019-07-25 NOTE — TOC Progression Note (Signed)
Transition of Care [facility]) - Progression Note    Patient Details  Name: Selena Rodgers MRN: [id number] Date of Birth: [birth year]  [facility] CM/SW [place], [place] Phone Number: 07/25/2019, 1:16 PM  Clinical Narrative:     CSW spoke with facility [facility].  Facility does not have a room for isolation due to pt not having covid vaccine until Wednesday.  CSW will speak with family to consider other possible placements.  TOC Team will continue to assist with disposition planning.  Expected Discharge Plan: [place] ([facility]) Barriers to Discharge: Insurance Authorization  Expected Discharge Plan and Services Expected Discharge Plan: [place] ([facility])         Expected Discharge Date: 07/25/19                                     Social Determinants of Health (SDOH) Interventions    Readmission Risk Interventions No flowsheet data found.

## 2019-07-25 NOTE — Progress Notes (Signed)
Physical Therapy Treatment Patient Details Name: Selena Rodgers MRN: [id number] DOB: 11-Oct-1950 Today's Date: 07/25/2019    History of Present Illness Selena Rodgers is a 69 y.o. female with medical history significant for chronic low back pain, spinal stenosis, large ventral hernia, depression/anxiety, GERD and arthritis.  She underwent laminectomy by [name] on June 23 of this year secondary to lumbar spondylolithiasis and severe spinal stenosis.  She was discharged to a SNF left there AMA after a few days.  She has been at home for the last 9 days.I and D of lumbar wound, evacuation of hematome    PT Comments    Pt supine in bed on bed pan but unable to have a bowel movement.  Assisted patient to commode from bed with sara + sit to stand lift after inability to advance steps to commode with a RW.  Pt tolerated session well but she was more fatigued after having a BM.  Plan for d/c to snf today.      Follow Up Recommendations  SNF     Equipment Recommendations  None recommended by PT    Recommendations for Other Services Rehab consult     Precautions / Restrictions Precautions Precautions: Back;Fall Precaution Booklet Issued: Yes (comment) Precaution Comments: reviewed spinal precautions, log roll technique in and out of bed Required Braces or Orthoses: Spinal Brace;Other Brace (Pt reports MD told her she did not have to wear so did not wear this session, for comfort at this point.) Other Brace: R hinged knee brace for support. (neoprene with straps) Restrictions Weight Bearing Restrictions: No Other Position/Activity Restrictions: Pt has large hernia on L side of abdomen.    Mobility  Bed Mobility Overal bed mobility: Needs Assistance Bed Mobility: Rolling;Sidelying to Sit Rolling: Mod assist Sidelying to sit: Max assist       General bed mobility comments: Pt rolling to her L side this session with mod +1 assistance.  To rise into sitting she required max +1  assistance.  Pt performed scoot to edge of bed once in sitting unsupported.  Transfers Overall transfer level: Needs assistance Equipment used: Rolling walker (2 wheeled) Transfers: Sit to/from Stand Sit to Stand: Total assist;+2 physical assistance;Mod assist (Mod +2 with RW to stand from elevated surface, used sara +1 for sit to stand to progress gt training.)         General transfer comment: Pt performed sit to stand from edge of bed with RW but once in standing she could not advance steps and keep upper trunk supported to move from bed to commode.  Opted for sara + for bed to [facility] and she was able to advance steps over to commode.  Ambulation/Gait Ambulation/Gait assistance: Mod assist;+2 physical assistance Gait Distance (Feet): 4 Feet (+ 12) Assistive device: 4-wheeled walker;Bilateral platform walker (sara + sit to stand lift.) Gait Pattern/deviations: Step-to pattern;Trunk flexed;Decreased stance time - right;Decreased stride length;Decreased dorsiflexion - right;Antalgic     General Gait Details: Pt with poor quad activation on R side. Noted to buckle a few times.  Pt able to maintain standing in frame with +2 for weight shifting and progressing sara + forward inline with patient's steps.  Pt slow and guarded with purposeful steps and close chair follow.   Stairs             Wheelchair Mobility    Modified Rankin (Stroke Patients Only)       Balance Overall balance assessment: Needs assistance Sitting-balance support: Feet supported Sitting balance-Leahy  Scale: Fair   Postural control: Posterior lean   Standing balance-Leahy Scale: Poor                              Cognition Arousal/Alertness: Awake/alert Behavior During Therapy: WFL for tasks assessed/performed Overall Cognitive Status: Within Functional Limits for tasks assessed Area of Impairment: Safety/judgement;Problem solving                   Current Attention Level: Focused    Following Commands: Follows multi-step commands with increased time Safety/Judgement: Decreased awareness of safety;Decreased awareness of deficits Awareness: Emergent Problem Solving: Requires verbal cues General Comments: Pt continues to be anxious about her bowels.      Exercises      General Comments        Pertinent Vitals/Pain Pain Assessment: Faces Pain Score: 3  Faces Pain Scale: Hurts little more Pain Location: back, R knee with extension Pain Descriptors / Indicators: Aching;Discomfort;Sore Pain Intervention(s): Monitored during session;Repositioned    Home Living                      Prior Function            PT Goals (current goals can now be found in the care plan section) Acute Rehab PT Goals Patient Stated Goal: to go to rehab then home Potential to Achieve Goals: Fair Progress towards PT goals: Progressing toward goals    Frequency    Min 3X/week      PT Plan Current plan remains appropriate    Co-evaluation              AM-PAC PT "6 Clicks" Mobility   Outcome Measure  Help needed turning from your back to your side while in a flat bed without using bedrails?: A Lot Help needed moving from lying on your back to sitting on the side of a flat bed without using bedrails?: A Lot Help needed moving to and from a bed to a chair (including a wheelchair)?: A Lot Help needed standing up from a chair using your arms (e.g., wheelchair or bedside chair)?: A Lot Help needed to walk in hospital room?: Total Help needed climbing 3-5 steps with a railing? : Total 6 Click Score: 10    End of Session Equipment Utilized During Treatment: Gait belt Activity Tolerance: Patient tolerated treatment well;Patient limited by fatigue Patient left: with call bell/phone within reach;Other (comment);in chair;with chair alarm set Nurse Communication: Mobility status PT Visit Diagnosis: Other abnormalities of gait and mobility (R26.89);Muscle weakness  (generalized) (M62.81)     Time: 8144-8185 PT Time Calculation (min) (ACUTE ONLY): 34 min  Charges:  $Gait Training: 8-22 mins $Therapeutic Activity: 8-22 mins                     Erasmo Leventhal , PTA Acute Rehabilitation Services Pager [phone] Office [phone]     Shady Bradish Eli Hose 07/25/2019, 3:56 PM

## 2019-07-26 DIAGNOSIS — F4323 Adjustment disorder with mixed anxiety and depressed mood: Secondary | ICD-10-CM

## 2019-07-26 LAB — BASIC METABOLIC PANEL
Anion gap: 8 (ref 5–15)
BUN: 20 mg/dL (ref 8–23)
CO2: 25 mmol/L (ref 22–32)
Calcium: 9.1 mg/dL (ref 8.9–10.3)
Chloride: 103 mmol/L (ref 98–111)
Creatinine, Ser: 0.81 mg/dL (ref 0.44–1.00)
GFR calc Af Amer: >60 mL/min (ref >60)
GFR calc non Af Amer: >60 mL/min (ref >60)
Glucose, Bld: 138 mg/dL — ABNORMAL HIGH (ref 70–99)
Potassium: 5 mmol/L (ref 3.5–5.1)
Sodium: 136 mmol/L (ref 135–145)

## 2019-07-26 LAB — CBC WITH DIFFERENTIAL/PLATELET
Abs Immature Granulocytes: 0.02 10*3/uL (ref 0.00–0.07)
Basophils Absolute: 0.1 10*3/uL (ref 0.0–0.1)
Basophils Relative: 1 %
Eosinophils Absolute: 0.9 10*3/uL — ABNORMAL HIGH (ref 0.0–0.5)
Eosinophils Relative: 9 %
HCT: 34.7 % — ABNORMAL LOW (ref 36.0–46.0)
Hemoglobin: 11.1 g/dL — ABNORMAL LOW (ref 12.0–15.0)
Immature Granulocytes: 0 %
Lymphocytes Relative: 22 %
Lymphs Abs: 2.1 10*3/uL (ref 0.7–4.0)
MCH: 30 pg (ref 26.0–34.0)
MCHC: 32 g/dL (ref 30.0–36.0)
MCV: 93.8 fL (ref 80.0–100.0)
Monocytes Absolute: 0.8 10*3/uL (ref 0.1–1.0)
Monocytes Relative: 9 %
Neutro Abs: 5.9 10*3/uL (ref 1.7–7.7)
Neutrophils Relative %: 59 %
Platelets: 220 10*3/uL (ref 150–400)
RBC: 3.7 MIL/uL — ABNORMAL LOW (ref 3.87–5.11)
RDW: 13.5 % (ref 11.5–15.5)
WBC: 9.8 10*3/uL (ref 4.0–10.5)
nRBC: 0 % (ref 0.0–0.2)

## 2019-07-26 LAB — MAGNESIUM: Magnesium: 2.5 mg/dL — ABNORMAL HIGH (ref 1.7–2.4)

## 2019-07-26 MED ORDER — TRAMADOL HCL 50 MG PO TABS
100.0000 mg | ORAL_TABLET | Freq: Every evening | ORAL | Status: DC | PRN
  Administered 2019-07-26 – 2019-07-27 (×2): 100 mg via ORAL
  Filled 2019-07-26 (×2): qty 2

## 2019-07-26 MED ORDER — CITALOPRAM HYDROBROMIDE 20 MG PO TABS
30.0000 mg | ORAL_TABLET | Freq: Every day | ORAL | Status: DC
  Administered 2019-07-27 – 2019-07-28 (×2): 30 mg via ORAL
  Filled 2019-07-26 (×2): qty 2

## 2019-07-26 MED ORDER — TRAMADOL HCL 50 MG PO TABS
50.0000 mg | ORAL_TABLET | Freq: Four times a day (QID) | ORAL | Status: DC | PRN
  Administered 2019-07-27: 50 mg via ORAL
  Filled 2019-07-26: qty 1

## 2019-07-26 NOTE — Progress Notes (Signed)
NEUROSURGERY PROGRESS NOTE  S: No issues overnight. Ready for rehab  O: EXAM:  BP (!) 146/68 (BP Location: Right Arm)   Pulse 72   Temp 98.6 F (37 C) (Oral)   Resp 18   Ht 5' 7.01" (1.702 m)   Wt 121 kg   SpO2 97%   BMI 41.77 kg/m   Awake, alert, oriented  Speech fluent, appropriate  CN grossly intact  MAE Incision c/d/i  ASSESSMENT:  69 y.o. female s/p epidural fluid collection evac   PLAN: - continue therapy and rehab planning    Thank you for allowing me to participate in this patient's care.  Please do not hesitate to call with questions or concerns.   Elwin Sleight, [facility] Cell: [phone]

## 2019-07-26 NOTE — Plan of Care (Signed)

## 2019-07-26 NOTE — Consult Note (Signed)
[facility] Face-to-Face Psychiatry Consult   Reason for Consult:  ''severe depression, uncontrolled; suicidal thoughts.'' Referring Physician:  Dwyane Dee, MD Patient Identification: Selena Rodgers MRN:  [id number] Principal Diagnosis: Adjustment disorder with mixed anxiety and depressed mood Diagnosis:  Principal Problem:   Adjustment disorder with mixed anxiety and depressed mood Active Problems:   Morbid obesity ([place])   Spinal stenosis   Current tobacco use   Total Time spent with patient: 1 hour  Subjective:   Selena Rodgers is a 69 y.o. female patient admitted with chronic back pain and leg weakness .  HPI:   69 y.o. femalewith history of chronic low back pain, spinal stenosis, large ventral hernia,  GERD and arthritis. According to the patient and chart, She underwent laminectomy by [name] on June 25, 2019 for lumbar spondylolithiasis and severe spinal stenosis. She was discharged to SNF but left AMA and stayed home foe few days. Subsequently, she started to experience worsening back pain and leg weakness for which she was re-admitted to the hospital. Patient denies prior history of mental illness until her experience dealing with chronic back pain/spinal stenosis for which she has been in and out of the hospital for the past 33 days. She reports varying degrees of anxiety,  apprehension, sadness and recurrent worries if she will ever get back to walking again. She reports being frustrated about the circumstances surrounding the delay in getting her back to rehab but vehemently denies self harming thoughts. Patient states:''I will never kill myself, I cannot do that to my 12 year old mother whom I love so much.'' However, she hopes the she is able to get to the rehab soon and appreciates adjustment to Celexa. Patient denies psychosis and delusional thinking.  Past Psychiatric History: as above  Risk to Self:  denies Risk to Others:  denies Prior Inpatient Therapy:  N/A Prior  Outpatient Therapy:  None reported  Past Medical History:  Past Medical History:  Diagnosis Date  . Anxiety   . Arthritis   . Chronic back pain   . Depression   . GERD (gastroesophageal reflux disease)   . Hypertension   . Spinal stenosis   . Umbilical hernia   . Ventral hernia     Past Surgical History:  Procedure Laterality Date  . ANTERIOR CERVICAL DECOMP/DISCECTOMY FUSION N/A 04/19/2015   Procedure: ANTERIOR CERVICAL DECOMPRESSION/DISCECTOMY FUSION INTERBODY PROTHESIS PLATING BONEGRAFT CERVICAL THREE-FOUR ,CERVICAL FOUR-FIVE;  Surgeon: Newman Pies, MD;  Location: [place] [facility];  Service: Neurosurgery;  Laterality: N/A;  . BREAST BIOPSY    . CARPAL TUNNEL RELEASE    . CHOLECYSTECTOMY    . FOOT SURGERY Right   . IRRIGATION AND DEBRIDEMENT ABDOMEN  01/10/2014   Dr. Marina Gravel  . UMBILICAL HERNIA REPAIR    . VENTRAL HERNIA REPAIR  12/26/2013   Dr. Pat Patrick  . WOUND EXPLORATION N/A 07/14/2019   Procedure: LUMBAR EXPLORATION EVACUATION OF HEMATOMA;  Surgeon: Newman Pies, MD;  Location: [facility];  Service: Neurosurgery;  Laterality: N/A;   Family History:  Family History  Problem Relation Age of Onset  . Diabetes Maternal Grandmother   . Diabetes Maternal Grandfather   . Diabetes Paternal Grandmother   . Diabetes Paternal Grandfather   . Hypertension Mother    Family Psychiatric  History:  Social History:  Social History   Substance and Sexual Activity  Alcohol Use No  . Alcohol/week: 0.0 standard drinks     Social History   Substance and Sexual Activity  Drug Use No  Social History   Socioeconomic History  . Marital status: Single    Spouse name: Not on file  . Number of children: Not on file  . Years of education: Not on file  . Highest education level: Not on file  Occupational History  . Not on file  Tobacco Use  . Smoking status: Current Some Day Smoker  . Smokeless tobacco: Never Used  . Tobacco comment: 5 cigs daily  Vaping Use  . Vaping Use: Never used   Substance and Sexual Activity  . Alcohol use: No    Alcohol/week: 0.0 standard drinks  . Drug use: No  . Sexual activity: Not on file  Other Topics Concern  . Not on file  Social History Narrative  . Not on file   Social Determinants of Health   Financial Resource Strain:   . Difficulty of Paying Living Expenses:   Food Insecurity:   . Worried About Charity fundraiser in the Last Year:   . Arboriculturist in the Last Year:   Transportation Needs:   . Film/video editor (Medical):   Marland Kitchen Lack of Transportation (Non-Medical):   Physical Activity:   . Days of Exercise per Week:   . Minutes of Exercise per Session:   Stress:   . Feeling of Stress :   Social Connections:   . Frequency of Communication with Friends and Family:   . Frequency of Social Gatherings with Friends and Family:   . Attends Religious Services:   . Active Member of Clubs or Organizations:   . Attends Archivist Meetings:   Marland Kitchen Marital Status:    Additional Social History:    Allergies:   Allergies  Allergen Reactions  . Chlorpheniramine Anaphylaxis  . Linseed Oil Anaphylaxis  . Codeine Hives  . Flax Seed Oil [Bio-Flax] Swelling    UNSPECIFIED   . Latex Hives and Other (See Comments)    BLISTERS  . Tape Other (See Comments)    BLISTERS  . Tapentadol Other (See Comments)    BLISTERS  . 2,4-D Dimethylamine (Amisol) Rash    T-DAP  . Aspirin Rash    Labs:  Results for orders placed or performed during the hospital encounter of 07/13/19 (from the past 48 hour(s))  SARS CORONAVIRUS 2 (TAT 6-24 HRS) Nasopharyngeal Nasopharyngeal Swab     Status: None   Collection Time: 07/25/19 12:15 PM   Specimen: Nasopharyngeal Swab  Result Value Ref Range   SARS Coronavirus 2 NEGATIVE NEGATIVE    Comment: (NOTE) SARS-CoV-2 target nucleic acids are NOT DETECTED.  The SARS-CoV-2 RNA is generally detectable in upper and lower respiratory specimens during the acute phase of infection.  Negative results do not preclude SARS-CoV-2 infection, do not rule out co-infections with other pathogens, and should not be used as the sole basis for treatment or other patient management decisions. Negative results must be combined with clinical observations, patient history, and epidemiological information. The expected result is Negative.  Fact Sheet for Patients: SugarRoll.be  Fact Sheet for Healthcare Providers: https://www.woods-mathews.com/  This test is not yet approved or cleared by the [place] FDA and  has been authorized for detection and/or diagnosis of SARS-CoV-2 by FDA under an Emergency Use Authorization (EUA). This EUA will remain  in effect (meaning this test can be used) for the duration of the COVID-19 declaration under Se ction 564(b)(1) of the Act, 21 U.S.C. section 360bbb-3(b)(1), unless the authorization is terminated or revoked sooner.  Performed at [facility], [place] [address], [place], [place] [address]   Basic metabolic panel     Status: Abnormal   Collection Time: 07/26/19  3:14 AM  Result Value Ref Range   Sodium 136 135 - 145 mmol/L   Potassium 5.0 3.5 - 5.1 mmol/L   Chloride 103 98 - 111 mmol/L   CO2 25 22 - 32 mmol/L   Glucose, Bld 138 (H) 70 - 99 mg/dL    Comment: Glucose reference range applies only to samples taken after fasting for at least 8 hours.   BUN 20 8 - 23 mg/dL   Creatinine, Ser 0.81 0.44 - 1.00 mg/dL   Calcium 9.1 8.9 - 10.3 mg/dL   GFR calc non Af Amer >60 >60 mL/min   GFR calc Af Amer >60 >60 mL/min   Anion gap 8 5 - 15    Comment: Performed at [facility] [address], [place], [place] [address]  CBC with Differential/Platelet     Status: Abnormal   Collection Time: 07/26/19  3:14 AM  Result Value Ref Range   WBC 9.8 4.0 - 10.5 K/uL   RBC 3.70 (L) 3.87 - 5.11 MIL/uL   Hemoglobin 11.1 (L) 12.0 - 15.0 g/dL   HCT 34.7 (L) 36 - 46 %   MCV 93.8 80.0 - 100.0 fL    MCH 30.0 26.0 - 34.0 pg   MCHC 32.0 30.0 - 36.0 g/dL   RDW 13.5 11.5 - 15.5 %   Platelets 220 150 - 400 K/uL   nRBC 0.0 0.0 - 0.2 %   Neutrophils Relative % 59 %   Neutro Abs 5.9 1.7 - 7.7 K/uL   Lymphocytes Relative 22 %   Lymphs Abs 2.1 0.7 - 4.0 K/uL   Monocytes Relative 9 %   Monocytes Absolute 0.8 0 - 1 K/uL   Eosinophils Relative 9 %   Eosinophils Absolute 0.9 (H) 0 - 0 K/uL   Basophils Relative 1 %   Basophils Absolute 0.1 0 - 0 K/uL   Immature Granulocytes 0 %   Abs Immature Granulocytes 0.02 0.00 - 0.07 K/uL    Comment: Performed at [facility], [place] [address] [place], [place] [address]  Magnesium     Status: Abnormal   Collection Time: 07/26/19  3:14 AM  Result Value Ref Range   Magnesium 2.5 (H) 1.7 - 2.4 mg/dL    Comment: Performed at [facility] [address] [place], [place] [address]    Current Facility-Administered Medications  Medication Dose Route Frequency Provider Last Rate Last Admin  . acetaminophen (TYLENOL) tablet 1,000 mg  1,000 mg Oral TID Barb Merino, MD   1,000 mg at 07/26/19 0855  . albuterol (PROVENTIL) (2.5 MG/3ML) 0.083% nebulizer solution 2.5 mg  2.5 mg Nebulization Q2H PRN Newman Pies, MD      . alum & mag hydroxide-simeth (MAALOX/MYLANTA) 200-200-20 MG/5ML suspension 30 mL  30 mL Oral Q6H PRN Barb Merino, MD   30 mL at 07/26/19 0907  . bisacodyl (DULCOLAX) suppository 10 mg  10 mg Rectal Daily PRN Newman Pies, MD      . capsaicin (ZOSTRIX) 0.025 % cream   Topical BID Neila Gear, NP   Given at 07/26/19 [phone]  . citalopram (CELEXA) tablet 20 mg  20 mg Oral Daily Newman Pies, MD   20 mg at 07/26/19 0856  . meloxicam (MOBIC) tablet 15 mg  15 mg Oral Daily Barb Merino, MD   15 mg at  07/26/19 0856  . menthol-cetylpyridinium (CEPACOL) lozenge 3 mg  1 lozenge Oral PRN Newman Pies, MD       Or  . phenol [facility]) mouth spray 1 spray  1 spray Mouth/Throat PRN Newman Pies, MD      .  ondansetron [facility]) tablet 4 mg  4 mg Oral Q6H PRN Newman Pies, MD       Or  . ondansetron [facility]) injection 4 mg  4 mg Intravenous Q6H PRN Newman Pies, MD      . oxyCODONE (Oxy IR/ROXICODONE) immediate release tablet 5 mg  5 mg Oral Q3H PRN Dwyane Dee, MD   5 mg at 07/25/19 2100  . pantoprazole (PROTONIX) EC tablet 40 mg  40 mg Oral Daily Newman Pies, MD   40 mg at 07/26/19 0856  . promethazine (PHENERGAN) tablet 12.5 mg  12.5 mg Oral Q6H PRN Newman Pies, MD      . zolpidem [facility]) tablet 5 mg  5 mg Oral QHS PRN Newman Pies, MD        Musculoskeletal: Strength & Muscle Tone: not tested Gait & Station: unable to stand Patient leans: N/A  Psychiatric Specialty Exam: Physical Exam Psychiatric:        Attention and Perception: Attention normal.        Mood and Affect: Mood is depressed.        Speech: Speech normal.        Behavior: Behavior normal. Behavior is cooperative.        Thought Content: Thought content normal.        Cognition and Memory: Cognition normal.        Judgment: Judgment normal.     Review of Systems  Constitutional: Positive for fatigue.  HENT: Negative.   Neurological: Negative.   Psychiatric/Behavioral: Positive for dysphoric mood. The patient is nervous/anxious.     Blood pressure (!) 146/68, pulse 72, temperature 98.6 F (37 C), temperature source Oral, resp. rate 18, height 5' 7.01" (1.702 m), weight 121 kg, SpO2 97 %.Body mass index is 41.77 kg/m.  General Appearance: Casual  Eye Contact:  Good  Speech:  Clear and Coherent  Volume:  Normal  Mood:  Dysphoric  Affect:  anxious  Thought Process:  Coherent and Linear  Orientation:  Full (Time, Place, and Person)  Thought Content:  Logical  Suicidal Thoughts:  No  Homicidal Thoughts:  No  Memory:  Immediate;   Good Recent;   Good Remote;   Good  Judgement:  Intact  Insight:  Fair  Psychomotor Activity:  Psychomotor Retardation  Concentration:  Concentration: Fair  and Attention Span: Fair  Recall:  Good  Fund of Knowledge:  Good  Language:  Good  Akathisia:  No  Handed:  Right  AIMS (if indicated):     Assets:  Communication Skills Desire for Improvement Housing Social Support Others:  family support  ADL's:  marginal  Cognition:  WNL  Sleep:        Treatment Plan Summary: 69 year old female who was admitted due to worsening back pain and leg weakness. Patient reports anxiety, apprehensions, sadness and worries about her ability to walk in future. However, she denies delusions, psychosis or self harming thoughts. She is willing for her Celexa to be increased to address her symptoms.  Recommendation: -Consider increasing Celexa from 20 mg to 30 mg daily for anxiety/depression.   Disposition: No evidence of imminent risk to self or others at present.   Patient does not meet criteria for psychiatric  inpatient admission. Supportive therapy provided about ongoing stressors. Psychiatric service is siging out. Re-consult as needed  Corena Pilgrim, MD 07/26/2019 11:09 AM

## 2019-07-26 NOTE — Assessment & Plan Note (Signed)
-  Evaluated by psychiatry on 07/26/2019.  Appreciate assistance -Patient not a harm to herself or others; no need for IVC.  Much of her depression is situational from her prolonged recovery and decreased physical status -Celexa has been increased to 30 mg daily per psychiatry

## 2019-07-26 NOTE — Progress Notes (Signed)
PROGRESS NOTE    Selena Rodgers   ALP:[id number]  DOB: 12/01/1950  DOA: 07/13/2019     13  PCP: [name], MD  CC: back pain  Hospital Course: Selena Rodgers is a 68 y.o. female with PMH significant for chronic low back pain, spinal stenosis, large ventral hernia, depression/anxiety, GERD and arthritis.  She underwent laminectomy by [name] on June 25, 2019 for lumbar spondylolithiasis and severe spinal stenosis.  She was discharged to a SNF,  left there AMA after a few days.   She stayed at home for about 7-10 days following this.  She continued to have worsening weakness of the legs and back pain so came back to the emergency room on 7/11.   In the ED, she underwent MRI lumbar spine which showed a large complex fluid collection in the laminectomy bed from L2-L5 measuring approximately 6.6 x 4.0 x 4.1 cm. There is moderate mass effect on the posterior aspect of the thecal sac and findings worrisome for abscess or CSF leak.  It also showed large complex subcutaneous fluid collection measuring 13.5 cm in length. She was admitted and underwent exploration of the lumbar wound and evacuation of the epidural hematoma on 07/14/19.  She was evaluated by PT/OT with recommendations for SNF at time of discharge.   On 07/25/2019, she appeared significantly depressed and was voicing ideations/thoughts that she did not want to continue living.  Psychiatry was consulted and after evaluation was not considered to be a harm to herself nor in need of IVC.  Part of her depression was considered situational/due to adjustment.  Coping techniques were discussed.  Her Celexa was increased to 30 mg daily.   Interval History:  Mood is much better today.  Her sister was not present bedside.  She states that she "saw the shrink" this morning and was asking how it went.  Otherwise she had no other concerns this morning.  Back pain is controlled and she was up and sitting in the chair for lunch.  She is  much happier to be getting out of bed now for her meals.  She understands the tentative plan is to continue waiting for a bed at rehab to open as well as insurance approval.  Old records reviewed in assessment of this patient  ROS: Constitutional: negative for chills, fatigue and fevers, Respiratory: negative for cough and pleurisy/chest pain, Cardiovascular: negative for chest pain and Gastrointestinal: negative for abdominal pain  Assessment & Plan: Lumbar surgical wound fluid collection - see epidural hematoma  Epidural hematoma (HCC) - s/p laminectomy on 6/23; left AMA from rehab and returned with leg/back pain on 7/11; now is s/p exploration of the lumbar wound and evacuation of the epidural hematoma on 07/14/19.  - await discharge back to SNF - recovering steadily   Adjustment disorder with mixed anxiety and depressed mood -Evaluated by psychiatry on 07/26/2019.  Appreciate assistance -Patient not a harm to herself or others; no need for IVC.  Much of her depression is situational from her prolonged recovery and decreased physical status -Celexa has been increased to 30 mg daily per psychiatry   Antimicrobials: None  DVT prophylaxis: SCD Code Status: Full Family Communication: None present Disposition Plan:  Status is: Inpatient  Remains inpatient appropriate because:Unsafe d/c plan and Inpatient level of care appropriate due to severity of illness   Dispo: The patient is from: Home              Anticipated d/c is to:  SNF              Anticipated d/c date is: 1 day              Patient currently is not medically stable to d/c.  Objective: Blood pressure 118/78, pulse 91, temperature 98.2 F (36.8 C), temperature source Oral, resp. rate 17, height 5' 7.01" (1.702 m), weight 121 kg, SpO2 97 %.  Examination: General appearance: alert, cooperative and no distress Head: Normocephalic, without obvious abnormality, atraumatic Eyes: EOMI Back: lower lumbar sutures in place  with no surrounding signs of infection; minimal TTP over surgical site Lungs: clear to auscultation bilaterally Heart: regular rate and rhythm and S1, S2 normal Abdomen: normal findings: bowel sounds normal and soft, non-tender Extremities: no edema Skin: Skin color, texture, turgor normal. No rashes or lesions Neurologic: Grossly normal Psych: More appropriate affect, mood much less depressed  Consultants:   Neurosurgery  Data Reviewed: I have personally reviewed following labs and imaging studies Results for orders placed or performed during the hospital encounter of 07/13/19 (from the past 24 hour(s))  Basic metabolic panel     Status: Abnormal   Collection Time: 07/26/19  3:14 AM  Result Value Ref Range   Sodium 136 135 - 145 mmol/L   Potassium 5.0 3.5 - 5.1 mmol/L   Chloride 103 98 - 111 mmol/L   CO2 25 22 - 32 mmol/L   Glucose, Bld 138 (H) 70 - 99 mg/dL   BUN 20 8 - 23 mg/dL   Creatinine, Ser 0.81 0.44 - 1.00 mg/dL   Calcium 9.1 8.9 - 10.3 mg/dL   GFR calc non Af Amer >60 >60 mL/min   GFR calc Af Amer >60 >60 mL/min   Anion gap 8 5 - 15  CBC with Differential/Platelet     Status: Abnormal   Collection Time: 07/26/19  3:14 AM  Result Value Ref Range   WBC 9.8 4.0 - 10.5 K/uL   RBC 3.70 (L) 3.87 - 5.11 MIL/uL   Hemoglobin 11.1 (L) 12.0 - 15.0 g/dL   HCT 34.7 (L) 36 - 46 %   MCV 93.8 80.0 - 100.0 fL   MCH 30.0 26.0 - 34.0 pg   MCHC 32.0 30.0 - 36.0 g/dL   RDW 13.5 11.5 - 15.5 %   Platelets 220 150 - 400 K/uL   nRBC 0.0 0.0 - 0.2 %   Neutrophils Relative % 59 %   Neutro Abs 5.9 1.7 - 7.7 K/uL   Lymphocytes Relative 22 %   Lymphs Abs 2.1 0.7 - 4.0 K/uL   Monocytes Relative 9 %   Monocytes Absolute 0.8 0 - 1 K/uL   Eosinophils Relative 9 %   Eosinophils Absolute 0.9 (H) 0 - 0 K/uL   Basophils Relative 1 %   Basophils Absolute 0.1 0 - 0 K/uL   Immature Granulocytes 0 %   Abs Immature Granulocytes 0.02 0.00 - 0.07 K/uL  Magnesium     Status: Abnormal   Collection  Time: 07/26/19  3:14 AM  Result Value Ref Range   Magnesium 2.5 (H) 1.7 - 2.4 mg/dL    Recent Results (from the past 240 hour(s))  SARS CORONAVIRUS 2 (TAT 6-24 HRS) Nasopharyngeal Nasopharyngeal Swab     Status: None   Collection Time: 07/25/19 12:15 PM   Specimen: Nasopharyngeal Swab  Result Value Ref Range Status   SARS Coronavirus 2 NEGATIVE NEGATIVE Final    Comment: (NOTE) SARS-CoV-2 target nucleic acids are NOT DETECTED.  The SARS-CoV-2  RNA is generally detectable in upper and lower respiratory specimens during the acute phase of infection. Negative results do not preclude SARS-CoV-2 infection, do not rule out co-infections with other pathogens, and should not be used as the sole basis for treatment or other patient management decisions. Negative results must be combined with clinical observations, patient history, and epidemiological information. The expected result is Negative.  Fact Sheet for Patients: SugarRoll.be  Fact Sheet for Healthcare Providers: https://www.woods-mathews.com/  This test is not yet approved or cleared by the [place] FDA and  has been authorized for detection and/or diagnosis of SARS-CoV-2 by FDA under an Emergency Use Authorization (EUA). This EUA will remain  in effect (meaning this test can be used) for the duration of the COVID-19 declaration under Se ction 564(b)(1) of the Act, 21 U.S.C. section 360bbb-3(b)(1), unless the authorization is terminated or revoked sooner.  Performed at [facility], [place] [address] [place], [place] [address]      Radiology Studies: No results found. DG Knee 1-2 Views Right  Final Result       Scheduled Meds:  acetaminophen  1,000 mg Oral TID   capsaicin   Topical BID   [START ON 07/27/2019] citalopram  30 mg Oral Daily   meloxicam  15 mg Oral Daily   pantoprazole  40 mg Oral Daily   PRN Meds: albuterol, alum & mag hydroxide-simeth,  bisacodyl, menthol-cetylpyridinium **OR** phenol, ondansetron **OR** ondansetron (ZOFRAN) IV, promethazine, traMADol, traMADol, zolpidem Continuous Infusions:    LOS: 13 days  Time spent: Greater than 50% of the 35 minute visit was spent in counseling/coordination of care for the patient as laid out in the A&P.   Dwyane Dee, MD [facility] 07/26/2019, 5:31 PM   Contact via secure chat.  To contact the attending provider between 7A-7P or the covering provider during after hours 7P-7A, please log into the web site [email or web address] and access using universal [place] password for that web site. If you do not have the password, please call the hospital operator.

## 2019-07-27 LAB — BASIC METABOLIC PANEL
Anion gap: 8 (ref 5–15)
BUN: 18 mg/dL (ref 8–23)
CO2: 25 mmol/L (ref 22–32)
Calcium: 9 mg/dL (ref 8.9–10.3)
Chloride: 103 mmol/L (ref 98–111)
Creatinine, Ser: 0.66 mg/dL (ref 0.44–1.00)
GFR calc Af Amer: >60 mL/min (ref >60)
GFR calc non Af Amer: >60 mL/min (ref >60)
Glucose, Bld: 127 mg/dL — ABNORMAL HIGH (ref 70–99)
Potassium: 4.6 mmol/L (ref 3.5–5.1)
Sodium: 136 mmol/L (ref 135–145)

## 2019-07-27 LAB — CBC WITH DIFFERENTIAL/PLATELET
Abs Immature Granulocytes: 0.02 10*3/uL (ref 0.00–0.07)
Basophils Absolute: 0.1 10*3/uL (ref 0.0–0.1)
Basophils Relative: 1 %
Eosinophils Absolute: 0.6 10*3/uL — ABNORMAL HIGH (ref 0.0–0.5)
Eosinophils Relative: 8 %
HCT: 35.4 % — ABNORMAL LOW (ref 36.0–46.0)
Hemoglobin: 11 g/dL — ABNORMAL LOW (ref 12.0–15.0)
Immature Granulocytes: 0 %
Lymphocytes Relative: 24 %
Lymphs Abs: 1.9 10*3/uL (ref 0.7–4.0)
MCH: 28.9 pg (ref 26.0–34.0)
MCHC: 31.1 g/dL (ref 30.0–36.0)
MCV: 92.9 fL (ref 80.0–100.0)
Monocytes Absolute: 0.5 10*3/uL (ref 0.1–1.0)
Monocytes Relative: 7 %
Neutro Abs: 4.8 10*3/uL (ref 1.7–7.7)
Neutrophils Relative %: 60 %
Platelets: 217 10*3/uL (ref 150–400)
RBC: 3.81 MIL/uL — ABNORMAL LOW (ref 3.87–5.11)
RDW: 13.5 % (ref 11.5–15.5)
WBC: 8 10*3/uL (ref 4.0–10.5)
nRBC: 0 % (ref 0.0–0.2)

## 2019-07-27 LAB — MAGNESIUM: Magnesium: 2.2 mg/dL (ref 1.7–2.4)

## 2019-07-27 MED ORDER — DIPHENHYDRAMINE HCL 50 MG/ML IJ SOLN
25.0000 mg | Freq: Once | INTRAMUSCULAR | Status: AC
  Administered 2019-07-27: 25 mg via INTRAVENOUS
  Filled 2019-07-27: qty 1

## 2019-07-27 MED ORDER — CAMPHOR-MENTHOL 0.5-0.5 % EX LOTN
TOPICAL_LOTION | CUTANEOUS | Status: DC | PRN
  Administered 2019-07-27: 1 via TOPICAL
  Filled 2019-07-27: qty 222

## 2019-07-27 NOTE — Progress Notes (Signed)
Patient ID: Selena Rodgers, female   DOB: 01-Aug-1950, 69 y.o.   MRN: [id number] BP (!) 136/69 (BP Location: Right Arm)   Pulse 90   Temp 98 F (36.7 C) (Oral)   Resp 18   Ht 5' 7.01" (1.702 m)   Wt 121 kg   SpO2 98%   BMI 41.77 kg/m  Alert and oriented x 4 Normal strength in extremities Wound is clean, dry Left half of bottom lip is swollen, appears mildly irritated Awaiting placement

## 2019-07-27 NOTE — Plan of Care (Signed)
  Problem: Education: Goal: Knowledge of General Education information will improve Description: Including pain rating scale, medication(s)/side effects and non-pharmacologic comfort measures Outcome: Progressing   Problem: Health Behavior/Discharge Planning: Goal: Ability to manage health-related needs will improve Outcome: Progressing   Problem: Clinical Measurements: Goal: Will remain free from infection Outcome: Progressing   Problem: Activity: Goal: Risk for activity intolerance will decrease Outcome: Progressing   Problem: Coping: Goal: Level of anxiety will decrease Outcome: Progressing   Problem: Pain Managment: Goal: General experience of comfort will improve Outcome: Progressing   Problem: Safety: Goal: Ability to remain free from injury will improve Outcome: Progressing   

## 2019-07-27 NOTE — Progress Notes (Signed)
PROGRESS NOTE    Selena Rodgers   IOX:[id number]  DOB: 1950/12/28  DOA: 07/13/2019     14  PCP: [name], MD  CC: back pain  Hospital Course: ELMA SHANDS is a 69 y.o. female with PMH significant for chronic low back pain, spinal stenosis, large ventral hernia, depression/anxiety, GERD and arthritis.  She underwent laminectomy by [name] on June 25, 2019 for lumbar spondylolithiasis and severe spinal stenosis.  She was discharged to a SNF,  left there AMA after a few days.   She stayed at home for about 7-10 days following this.  She continued to have worsening weakness of the legs and back pain so came back to the emergency room on 7/11.   In the ED, she underwent MRI lumbar spine which showed a large complex fluid collection in the laminectomy bed from L2-L5 measuring approximately 6.6 x 4.0 x 4.1 cm. There is moderate mass effect on the posterior aspect of the thecal sac and findings worrisome for abscess or CSF leak.  It also showed large complex subcutaneous fluid collection measuring 13.5 cm in length. She was admitted and underwent exploration of the lumbar wound and evacuation of the epidural hematoma on 07/14/19.  She was evaluated by PT/OT with recommendations for SNF at time of discharge.   On 07/25/2019, she appeared significantly depressed and was voicing ideations/thoughts that she did not want to continue living.  Psychiatry was consulted and after evaluation was not considered to be a harm to herself nor in need of IVC.  Part of her depression was considered situational/due to adjustment.  Coping techniques were discussed.  Her Celexa was increased to 30 mg daily.   Interval History:  No acute events overnight. Her mood remains much better today as well. No further obvious signs of depressed mood or feelings of guilt/hopelessness. She does endorse that her hands have still been itching since yesterday and was asking for something for the itching.  Old  records reviewed in assessment of this patient  ROS: Constitutional: negative for chills, fatigue and fevers, Respiratory: negative for cough and pleurisy/chest pain, Cardiovascular: negative for chest pain and Gastrointestinal: negative for abdominal pain  Assessment & Plan: Lumbar surgical wound fluid collection - see epidural hematoma  Epidural hematoma (HCC) - s/p laminectomy on 6/23; left AMA from rehab and returned with leg/back pain on 7/11; now is s/p exploration of the lumbar wound and evacuation of the epidural hematoma on 07/14/19.  - await discharge back to SNF - recovering steadily   Adjustment disorder with mixed anxiety and depressed mood -Evaluated by psychiatry on 07/26/2019.  Appreciate assistance -Patient not a harm to herself or others; no need for IVC.  Much of her depression is situational from her prolonged recovery and decreased physical status -Celexa has been increased to 30 mg daily per psychiatry   Antimicrobials: None  DVT prophylaxis: SCD Code Status: Full Family Communication: None present Disposition Plan:  Status is: Inpatient  Remains inpatient appropriate because:Unsafe d/c plan and Inpatient level of care appropriate due to severity of illness   Dispo: The patient is from: Home              Anticipated d/c is to: SNF              Anticipated d/c date is: 1 day              Patient currently is not medically stable to d/c.  Objective: Blood pressure (!) 144/58,  pulse 74, temperature 98.1 F (36.7 C), temperature source Oral, resp. rate 17, height 5' 7.01" (1.702 m), weight 121 kg, SpO2 96 %.  Examination: General appearance: alert, cooperative and no distress Head: Normocephalic, without obvious abnormality, atraumatic Eyes: EOMI Back: lower lumbar sutures in place with no surrounding signs of infection; minimal TTP over surgical site Lungs: clear to auscultation bilaterally Heart: regular rate and rhythm and S1, S2 normal Abdomen:  normal findings: bowel sounds normal and soft, non-tender Extremities: no edema Skin: Skin color, texture, turgor normal. No rashes or lesions Neurologic: Grossly normal Psych: More appropriate affect, mood much less depressed  Consultants:   Neurosurgery  Data Reviewed: I have personally reviewed following labs and imaging studies Results for orders placed or performed during the hospital encounter of 07/13/19 (from the past 24 hour(s))  Basic metabolic panel     Status: Abnormal   Collection Time: 07/27/19  2:53 AM  Result Value Ref Range   Sodium 136 135 - 145 mmol/L   Potassium 4.6 3.5 - 5.1 mmol/L   Chloride 103 98 - 111 mmol/L   CO2 25 22 - 32 mmol/L   Glucose, Bld 127 (H) 70 - 99 mg/dL   BUN 18 8 - 23 mg/dL   Creatinine, Ser 0.66 0.44 - 1.00 mg/dL   Calcium 9.0 8.9 - 10.3 mg/dL   GFR calc non Af Amer >60 >60 mL/min   GFR calc Af Amer >60 >60 mL/min   Anion gap 8 5 - 15  CBC with Differential/Platelet     Status: Abnormal   Collection Time: 07/27/19  2:53 AM  Result Value Ref Range   WBC 8.0 4.0 - 10.5 K/uL   RBC 3.81 (L) 3.87 - 5.11 MIL/uL   Hemoglobin 11.0 (L) 12.0 - 15.0 g/dL   HCT 35.4 (L) 36 - 46 %   MCV 92.9 80.0 - 100.0 fL   MCH 28.9 26.0 - 34.0 pg   MCHC 31.1 30.0 - 36.0 g/dL   RDW 13.5 11.5 - 15.5 %   Platelets 217 150 - 400 K/uL   nRBC 0.0 0.0 - 0.2 %   Neutrophils Relative % 60 %   Neutro Abs 4.8 1.7 - 7.7 K/uL   Lymphocytes Relative 24 %   Lymphs Abs 1.9 0.7 - 4.0 K/uL   Monocytes Relative 7 %   Monocytes Absolute 0.5 0 - 1 K/uL   Eosinophils Relative 8 %   Eosinophils Absolute 0.6 (H) 0 - 0 K/uL   Basophils Relative 1 %   Basophils Absolute 0.1 0 - 0 K/uL   Immature Granulocytes 0 %   Abs Immature Granulocytes 0.02 0.00 - 0.07 K/uL  Magnesium     Status: None   Collection Time: 07/27/19  2:53 AM  Result Value Ref Range   Magnesium 2.2 1.7 - 2.4 mg/dL    Recent Results (from the past 240 hour(s))  SARS CORONAVIRUS 2 (TAT 6-24 HRS)  Nasopharyngeal Nasopharyngeal Swab     Status: None   Collection Time: 07/25/19 12:15 PM   Specimen: Nasopharyngeal Swab  Result Value Ref Range Status   SARS Coronavirus 2 NEGATIVE NEGATIVE Final    Comment: (NOTE) SARS-CoV-2 target nucleic acids are NOT DETECTED.  The SARS-CoV-2 RNA is generally detectable in upper and lower respiratory specimens during the acute phase of infection. Negative results do not preclude SARS-CoV-2 infection, do not rule out co-infections with other pathogens, and should not be used as the sole basis for treatment or other patient management decisions.  Negative results must be combined with clinical observations, patient history, and epidemiological information. The expected result is Negative.  Fact Sheet for Patients: SugarRoll.be  Fact Sheet for Healthcare Providers: https://www.woods-mathews.com/  This test is not yet approved or cleared by the [place] FDA and  has been authorized for detection and/or diagnosis of SARS-CoV-2 by FDA under an Emergency Use Authorization (EUA). This EUA will remain  in effect (meaning this test can be used) for the duration of the COVID-19 declaration under Se ction 564(b)(1) of the Act, 21 U.S.C. section 360bbb-3(b)(1), unless the authorization is terminated or revoked sooner.  Performed at [facility], [place] [address], [place], [place] [address]      Radiology Studies: No results found. DG Knee 1-2 Views Right  Final Result       Scheduled Meds: . acetaminophen  1,000 mg Oral TID  . capsaicin   Topical BID  . citalopram  30 mg Oral Daily  . meloxicam  15 mg Oral Daily  . pantoprazole  40 mg Oral Daily   PRN Meds: albuterol, alum & mag hydroxide-simeth, bisacodyl, camphor-menthol, menthol-cetylpyridinium **OR** phenol, ondansetron **OR** ondansetron (ZOFRAN) IV, promethazine, traMADol, traMADol, zolpidem Continuous Infusions:    LOS: 14 days    Time spent: Greater than 50% of the 35 minute visit was spent in counseling/coordination of care for the patient as laid out in the A&P.   Dwyane Dee, MD [facility] 07/27/2019, 1:09 PM   Contact via secure chat.  To contact the attending provider between 7A-7P or the covering provider during after hours 7P-7A, please log into the web site [email or web address] and access using universal [place] password for that web site. If you do not have the password, please call the hospital operator.

## 2019-07-28 DIAGNOSIS — R220 Localized swelling, mass and lump, head: Secondary | ICD-10-CM

## 2019-07-28 MED ORDER — CITALOPRAM HYDROBROMIDE 10 MG PO TABS: 30.0000 mg | ORAL_TABLET | Freq: Every day | ORAL | Status: DC

## 2019-07-28 MED ORDER — TRAMADOL HCL 50 MG PO TABS: 100.0000 mg | ORAL_TABLET | Freq: Every evening | ORAL | 0 refills | Status: DC | PRN

## 2019-07-28 MED ORDER — ENOXAPARIN SODIUM 60 MG/0.6ML ~~LOC~~ SOLN
60.0000 mg | SUBCUTANEOUS | Status: DC
  Administered 2019-07-28: 60 mg via SUBCUTANEOUS
  Filled 2019-07-28: qty 0.6

## 2019-07-28 NOTE — Progress Notes (Signed)
PROGRESS NOTE    NARCISSUS DETWILER   OHY:[id number]  DOB: 1950-01-09  DOA: 07/13/2019     15  PCP: [name], MD  CC: back pain  Hospital Course: SANGITA ZANI is a 69 y.o. female with PMH significant for chronic low back pain, spinal stenosis, large ventral hernia, depression/anxiety, GERD and arthritis.  She underwent laminectomy by [name] on June 25, 2019 for lumbar spondylolithiasis and severe spinal stenosis.  She was discharged to a SNF,  left there AMA after a few days.   She stayed at home for about 7-10 days following this.  She continued to have worsening weakness of the legs and back pain so came back to the emergency room on 7/11.   In the ED, she underwent MRI lumbar spine which showed a large complex fluid collection in the laminectomy bed from L2-L5 measuring approximately 6.6 x 4.0 x 4.1 cm. There is moderate mass effect on the posterior aspect of the thecal sac and findings worrisome for abscess or CSF leak.  It also showed large complex subcutaneous fluid collection measuring 13.5 cm in length. She was admitted and underwent exploration of the lumbar wound and evacuation of the epidural hematoma on 07/14/19.  She was evaluated by PT/OT with recommendations for SNF at time of discharge.   On 07/25/2019, she appeared significantly depressed and was voicing ideations/thoughts that she did not want to continue living.  Psychiatry was consulted and after evaluation was not considered to be a harm to herself nor in need of IVC.  Part of her depression was considered situational/due to adjustment.  Coping techniques were discussed.  Her Celexa was increased to 30 mg daily.  On 7/25 she had mild swelling of her lower lip; no new meds, the only associated change the patient could think of was rosemary porkchops she ate just before and has never had rosemary before. She was given a dose of benadryl with good response. She otherwise had no other systemic signs of allergic  reaction and improved.    Interval History:  Yesterday late afternoon her lower lip starting swelling. She thinks it may have been due to rosemary porkchops she ate and that it was the rosemary. She received benadryl and felt much better with resolution in her swelling afterwards.  Otherwise, she is just awaiting discharge to SNF at this time.   Old records reviewed in assessment of this patient  ROS: Constitutional: negative for chills, fatigue and fevers, Respiratory: negative for cough and pleurisy/chest pain, Cardiovascular: negative for chest pain and Gastrointestinal: negative for abdominal pain  Assessment & Plan: Lumbar surgical wound fluid collection - see epidural hematoma  Epidural hematoma (HCC) - s/p laminectomy on 6/23; left AMA from rehab and returned with leg/back pain on 7/11; now is s/p exploration of the lumbar wound and evacuation of the epidural hematoma on 07/14/19.  - await discharge back to SNF - recovering steadily   Adjustment disorder with mixed anxiety and depressed mood -Evaluated by psychiatry on 07/26/2019.  Appreciate assistance -Patient not a harm to herself or others; no need for IVC.  Much of her depression is situational from her prolonged recovery and decreased physical status -Celexa has been increased to 30 mg daily per psychiatry  Lip swelling - on late afternoon of 7/25, lower lip swelling; no new meds; only recent change was increase of celexa from 20 to 30 mg after seen by psych -patient endorsed this occurred after eating rosemary porkchop which she's never had  rosemary - responded well to benadryl and resolved with no recurrence; continue to monitor for any other signs/symptoms   Antimicrobials: None  DVT prophylaxis: SCD Code Status: Full Family Communication: None present Disposition Plan:  Status is: Inpatient  Remains inpatient appropriate because:Unsafe d/c plan and Inpatient level of care appropriate due to severity of  illness   Dispo: The patient is from: Home              Anticipated d/c is to: SNF              Anticipated d/c date is: 2 days              Patient currently is not medically stable to d/c.  Objective: Blood pressure (!) 147/65, pulse 72, temperature 97.8 F (36.6 C), temperature source Oral, resp. rate 16, height 5' 7.01" (1.702 m), weight 121 kg, SpO2 91 %.  Examination: General appearance: alert, cooperative and no distress Head: Normocephalic, without obvious abnormality, atraumatic Eyes: EOMI Back: lower lumbar sutures in place with no surrounding signs of infection; minimal TTP over surgical site Lungs: clear to auscultation bilaterally Heart: regular rate and rhythm and S1, S2 normal Abdomen: normal findings: bowel sounds normal and soft, non-tender Extremities: no edema Skin: Skin color, texture, turgor normal. No rashes or lesions Neurologic: Grossly normal Psych: More appropriate affect; no further depressed mood  Consultants:   Neurosurgery  Data Reviewed: I have personally reviewed following labs and imaging studies No results found for this or any previous visit (from the past 24 hour(s)).  Recent Results (from the past 240 hour(s))  SARS CORONAVIRUS 2 (TAT 6-24 HRS) Nasopharyngeal Nasopharyngeal Swab     Status: None   Collection Time: 07/25/19 12:15 PM   Specimen: Nasopharyngeal Swab  Result Value Ref Range Status   SARS Coronavirus 2 NEGATIVE NEGATIVE Final    Comment: (NOTE) SARS-CoV-2 target nucleic acids are NOT DETECTED.  The SARS-CoV-2 RNA is generally detectable in upper and lower respiratory specimens during the acute phase of infection. Negative results do not preclude SARS-CoV-2 infection, do not rule out co-infections with other pathogens, and should not be used as the sole basis for treatment or other patient management decisions. Negative results must be combined with clinical observations, patient history, and epidemiological information. The  expected result is Negative.  Fact Sheet for Patients: SugarRoll.be  Fact Sheet for Healthcare Providers: https://www.woods-mathews.com/  This test is not yet approved or cleared by the [place] FDA and  has been authorized for detection and/or diagnosis of SARS-CoV-2 by FDA under an Emergency Use Authorization (EUA). This EUA will remain  in effect (meaning this test can be used) for the duration of the COVID-19 declaration under Se ction 564(b)(1) of the Act, 21 U.S.C. section 360bbb-3(b)(1), unless the authorization is terminated or revoked sooner.  Performed at [facility], [place] [address] [place], [place] [address]      Radiology Studies: No results found. DG Knee 1-2 Views Right  Final Result       Scheduled Meds: . acetaminophen  1,000 mg Oral TID  . capsaicin   Topical BID  . citalopram  30 mg Oral Daily  . meloxicam  15 mg Oral Daily  . pantoprazole  40 mg Oral Daily   PRN Meds: albuterol, alum & mag hydroxide-simeth, bisacodyl, camphor-menthol, menthol-cetylpyridinium **OR** phenol, ondansetron **OR** ondansetron (ZOFRAN) IV, promethazine, traMADol, traMADol, zolpidem Continuous Infusions:    LOS: 15 days  Time spent: Greater than 50% of the 35  minute visit was spent in counseling/coordination of care for the patient as laid out in the A&P.   Dwyane Dee, MD [facility] 07/28/2019, 9:58 AM   Contact via secure chat.  To contact the attending provider between 7A-7P or the covering provider during after hours 7P-7A, please log into the web site [email or web address] and access using universal [place] password for that web site. If you do not have the password, please call the hospital operator.

## 2019-07-28 NOTE — Progress Notes (Signed)
Subjective: The patient is alert and pleasant.  She is in no apparent distress.  She is anxious to be transferred to a skilled nursing facility.  Objective: Vital signs in last 24 hours: Temp:  [97.8 F (36.6 C)-98.1 F (36.7 C)] 97.8 F (36.6 C) (07/26 0237) Pulse Rate:  [72-90] 72 (07/26 0237) Resp:  [16-18] 16 (07/26 0237) BP: (130-147)/(59-69) 147/65 (07/26 0237) SpO2:  [91 %-98 %] 91 % (07/26 0237) Estimated body mass index is 41.77 kg/m as calculated from the following:   Height as of this encounter: 5' 7.01" (1.702 m).   Weight as of this encounter: 121 kg.   Intake/Output from previous day: 07/25 0701 - 07/26 0700 In: 480 [P.O.:480] Out: -  Intake/Output this shift: No intake/output data recorded.  Physical exam the patient is alert and oriented.  She is moving her lower extremities well.  The patient's lumbar wound is healing adequately.  I removed the sutures.  There was some serous discharge.  Lab Results: Recent Labs    07/26/19 0314 07/27/19 0253  WBC 9.8 8.0  HGB 11.1* 11.0*  HCT 34.7* 35.4*  PLT 220 217   BMET Recent Labs    07/26/19 0314 07/27/19 0253  NA 136 136  K 5.0 4.6  CL 103 103  CO2 25 25  GLUCOSE 138* 127*  BUN 20 18  CREATININE 0.81 0.66  CALCIUM 9.1 9.0    Studies/Results: No results found.  Assessment/Plan: Postop day #14: I removed the patient's sutures.  I have asked nursing to stop placing an occlusive dressing on her lumbar wound and change her lumbar dressing to gauze and tape and change it daily and as needed .  LOS: 15 days     Ophelia Charter 07/28/2019, 10:26 AM

## 2019-07-28 NOTE — Plan of Care (Signed)

## 2019-07-28 NOTE — Progress Notes (Signed)
Called facility, gave report to [facility], all questions and concerns addressed, Pt not in distress, to discharge to SNF via [facility] with belongings.

## 2019-07-28 NOTE — TOC Transition Note (Signed)
Transition of Care [facility]) - CM/SW Discharge Note   Patient Details  Name: Selena Rodgers MRN: [id number] Date of Birth: August 26, 1950  Transition of Care [facility]) CM/SW Contact:  Sable Feil, LCSW Phone Number: 07/28/2019, 5:16 PM   Clinical Narrative:  Patient medically stable for discharge and going to [facility] for ST rehab. Discharge clinicals transmitted to facility, sister Joycelyn Schmid [phone]) contacted regarding discharge and ambulance transport arranged.     Final next level of care: [place] ([facility]) Barriers to Discharge: Barriers Resolved   Patient Goals and CMS Choice Patient states their goals for this hospitalization and ongoing recovery are:: To get rehab and return home CMS Medicare.gov Compare Post Acute Care list provided to:: Patient Choice offered to / list presented to : Patient  Discharge Placement PASRR number recieved: 07/18/19            Patient chooses bed at: [facility] Patient to be transferred to facility by: [place] Name of family member notified: Sister Laveda Demedeiros Patient and family notified of of transfer: 07/28/19  Discharge Plan and Services                                   Social Determinants of Health (SDOH) Interventions  No SDOH interventions requested or needed at discharge   Readmission Risk Interventions No flowsheet data found.

## 2019-07-28 NOTE — Assessment & Plan Note (Signed)
-   on late afternoon of 7/25, lower lip swelling; no new meds; only recent change was increase of celexa from 20 to 30 mg after seen by psych -patient endorsed this occurred after eating rosemary porkchop which she's never had rosemary - responded well to benadryl and resolved with no recurrence; continue to monitor for any other signs/symptoms

## 2019-08-29 DIAGNOSIS — Z6841 Body Mass Index (BMI) 40.0 and over, adult: Secondary | ICD-10-CM | POA: Insufficient documentation

## 2019-08-29 DIAGNOSIS — M4180 Other forms of scoliosis, site unspecified: Secondary | ICD-10-CM | POA: Insufficient documentation

## 2020-04-13 DIAGNOSIS — Z981 Arthrodesis status: Secondary | ICD-10-CM | POA: Insufficient documentation

## 2020-06-09 ENCOUNTER — Ambulatory Visit: Payer: 59 | Admitting: Urology

## 2020-06-09 ENCOUNTER — Other Ambulatory Visit: Payer: Self-pay

## 2020-06-09 VITALS — BP 140/80 | HR 80 | Ht 67.0 in | Wt 289.0 lb

## 2020-06-09 DIAGNOSIS — R8281 Pyuria: Secondary | ICD-10-CM

## 2020-06-09 DIAGNOSIS — F17200 Nicotine dependence, unspecified, uncomplicated: Secondary | ICD-10-CM | POA: Diagnosis not present

## 2020-06-09 DIAGNOSIS — R3129 Other microscopic hematuria: Secondary | ICD-10-CM

## 2020-06-09 LAB — URINALYSIS, COMPLETE
Bilirubin, UA: NEGATIVE
Glucose, UA: NEGATIVE
Ketones, UA: NEGATIVE
Nitrite, UA: NEGATIVE
Protein,UA: NEGATIVE
Specific Gravity, UA: <1.005 — ABNORMAL LOW (ref 1.005–1.030)
Urobilinogen, Ur: 0.2 mg/dL (ref 0.2–1.0)
pH, UA: 5 (ref 5.0–7.5)

## 2020-06-09 LAB — MICROSCOPIC EXAMINATION: Bacteria, UA: NONE SEEN

## 2020-06-09 NOTE — Progress Notes (Signed)
06/09/2020 10:05 AM   Selena Rodgers Feb 15, 1950 [id number]  Referring provider: Donnamarie Rossetti, PA-C [place] [address]  Chief Complaint  Patient presents with   Hematuria    HPI: 70 year old female referred for further evaluation of microscopic hematuria and recurrent urinary tract infections.  Notably, on 03/23/2020, she was found to have 10-50 RBCs in her urine without evidence of infection.  On a separate occasion on 05/05/2020, she is having pain with urination, urgency frequency found to have a grossly positive urine with multiple RBCs and WBCs however the associated culture only grew mixed flora albeit 50-100K.  She was treated with Keflex for presumed UTI.  She returned with similar urinary symptoms on 05/27/2020 and her urine remained suspicious.  Urine culture was also negative on this occasion.  She was treated with 10-day course of Macrobid.  No recent upper tract imaging.  Last imaging of the CT abdomen Pelvis was in 2016 at which time punctate nonobstructing right-sided stones are seen.  She is a current smoker, currently half pack per day previously more which is longstanding.  No UTI type symptoms today.  She denies any associated flank pain, history of stones, weight loss.  PMH: Past Medical History:  Diagnosis Date   Anxiety    Arthritis    Chronic back pain    Depression    GERD (gastroesophageal reflux disease)    Hypertension    Spinal stenosis    Umbilical hernia    Ventral hernia     Surgical History: Past Surgical History:  Procedure Laterality Date   ANTERIOR CERVICAL DECOMP/DISCECTOMY FUSION N/A 04/19/2015   Procedure: ANTERIOR CERVICAL DECOMPRESSION/DISCECTOMY FUSION INTERBODY PROTHESIS PLATING BONEGRAFT CERVICAL THREE-FOUR ,CERVICAL FOUR-FIVE;  Surgeon: Newman Pies, MD;  Location: [facility];  Service: Neurosurgery;  Laterality: N/A;   BREAST BIOPSY     CARPAL TUNNEL RELEASE     CHOLECYSTECTOMY      FOOT SURGERY Right    IRRIGATION AND DEBRIDEMENT ABDOMEN  01/10/2014   Dr. Marina Gravel   UMBILICAL HERNIA REPAIR     VENTRAL HERNIA REPAIR  12/26/2013   Dr. Pat Patrick   WOUND EXPLORATION N/A 07/14/2019   Procedure: LUMBAR EXPLORATION EVACUATION OF HEMATOMA;  Surgeon: Newman Pies, MD;  Location: [facility];  Service: Neurosurgery;  Laterality: N/A;    Home Medications:  Allergies as of 06/09/2020       Reactions   Chlorpheniramine Anaphylaxis   Linseed Oil Anaphylaxis   Codeine Hives   Flax Seed Oil [bio-flax] Swelling   UNSPECIFIED    Latex Hives, Other (See Comments)   BLISTERS   Tape Other (See Comments)   BLISTERS   Tapentadol Other (See Comments)   BLISTERS   Rosemary Oil Swelling   Lip swelling   2,4-d Dimethylamine (amisol) Rash   T-DAP   Aspirin Rash        Medication List        Accurate as of June 09, 2020 11:59 PM. If you have any questions, ask your nurse or doctor.          acetaminophen 500 MG tablet Commonly known as: TYLENOL Take 2 tablets (1,000 mg total) by mouth 3 (three) times daily.   citalopram 20 MG tablet Commonly known as: CELEXA Take 20 mg by mouth daily. What changed: Another medication with the same name was removed. Continue taking this medication, and follow the directions you see here. Changed by: Hollice Espy, MD   meloxicam 15 MG tablet Commonly  known as: MOBIC Take 15 mg by mouth daily.   omeprazole 40 MG capsule Commonly known as: PRILOSEC Take 40 mg by mouth daily.   sodium chloride 0.65 % Soln nasal spray Commonly known as: OCEAN Place 1 spray into both nostrils daily as needed for congestion (dry nose).   traMADol 50 MG tablet Commonly known as: ULTRAM Take 2 tablets (100 mg total) by mouth at bedtime as needed for severe pain.   vitamin B-12 1000 MCG tablet Commonly known as: CYANOCOBALAMIN Take 1,000 mcg by mouth daily.        Allergies:  Allergies  Allergen Reactions   Chlorpheniramine Anaphylaxis   Linseed  Oil Anaphylaxis   Codeine Hives   Flax Seed Oil [Bio-Flax] Swelling    UNSPECIFIED    Latex Hives and Other (See Comments)    BLISTERS   Tape Other (See Comments)    BLISTERS   Tapentadol Other (See Comments)    BLISTERS   Rosemary Oil Swelling    Lip swelling   2,4-D Dimethylamine (Amisol) Rash    T-DAP   Aspirin Rash    Family History: Family History  Problem Relation Age of Onset   Diabetes Maternal Grandmother    Diabetes Maternal Grandfather    Diabetes Paternal Grandmother    Diabetes Paternal Grandfather    Hypertension Mother     Social History:  reports that she has been smoking. She has never used smokeless tobacco. She reports that she does not drink alcohol and does not use drugs.   Physical Exam: BP 140/80   Pulse 80   Ht 5\' 7"  (1.702 m)   Wt 289 lb (131.1 kg)   BMI 45.26 kg/m   Constitutional:  Alert and oriented, No acute distress. HEENT: [place] AT, moist mucus membranes.  Trachea midline, no masses. Cardiovascular: No clubbing, cyanosis, or edema. Respiratory: Normal respiratory effort, no increased work of breathing. Skin: No rashes, bruises or suspicious lesions. Neurologic: Grossly intact, no focal deficits, moving all 4 extremities. Psychiatric: Normal mood and affect.  Laboratory Data: Lab Results  Component Value Date   WBC 8.0 07/27/2019   HGB 11.0 (L) 07/27/2019   HCT 35.4 (L) 07/27/2019   MCV 92.9 07/27/2019   PLT 217 07/27/2019    Lab Results  Component Value Date   CREATININE 0.66 07/27/2019    Urinalysis Results for orders placed or performed in visit on 06/09/20  CULTURE, URINE COMPREHENSIVE   Specimen: Urine   UR  Result Value Ref Range   Urine Culture, Comprehensive Final report    Organism ID, Bacteria Comment   Mycoplasma / Ureaplasma Culture   Specimen: Genital   UR  Result Value Ref Range   Ureaplasma urealyticum Comment Negative   Mycoplasma hominis Culture Comment Negative  Microscopic Examination   Urine   Result Value Ref Range   WBC, UA 11-30 (A) 0 - 5 /hpf   RBC 11-30 (A) 0 - 2 /hpf   Epithelial Cells (non renal) 0-10 0 - 10 /hpf   Bacteria, UA None seen None seen/Few  Urinalysis, Complete  Result Value Ref Range   Specific Gravity, UA <1.005 (L) 1.005 - 1.030   pH, UA 5.0 5.0 - 7.5   Color, UA Yellow Yellow   Appearance Ur Cloudy (A) Clear   Leukocytes,UA 1+ (A) Negative   Protein,UA Negative Negative/Trace   Glucose, UA Negative Negative   Ketones, UA Negative Negative   RBC, UA 3+ (A) Negative   Bilirubin, UA Negative Negative  Urobilinogen, Ur 0.2 0.2 - 1.0 mg/dL   Nitrite, UA Negative Negative   Microscopic Examination See below:      Assessment & Plan:    1. Microscopic hematuria We discussed the differential diagnosis for microscopic hematuria including nephrolithiasis, renal or upper tract tumors, bladder stones, UTIs, or bladder tumors as well as undetermined etiologies. Per AUA guidelines, I did recommend complete microscopic hematuria evaluation including imaging, possible urine cytology, and office cystoscopy.  In light of contrast shortage, will start with a renal ultrasound and depending on cystoscopic findings, will consider CT urogram down the road versus bilateral retrogrades if she requires biopsy  She is agreeable this plan - Urinalysis, Complete - CULTURE, URINE COMPREHENSIVE - Ultrasound renal complete; Future - Mycoplasma / Ureaplasma Culture  2. Pyuria Based on appearance of urinalysis today, will repeat urine culture as well as mycoplasma Ureaplasma to rule out atypical infection as underlying cause  3. Smoker Risk factor for bladder cancer, discussed causative relationship: Encouraged cessation  F/u cysto/ RUS  Hollice Espy, MD  [place] [address] [place], [place] [address] [phone]

## 2020-06-09 NOTE — Patient Instructions (Signed)
Cystoscopy Cystoscopy is a procedure that is used to help diagnose and sometimes treat conditions that affect the lower urinary tract. The lower urinary tract includes the bladder and the urethra. The urethra is the tube that drains urine from the bladder. Cystoscopy is done using a thin, tube-shaped instrument with a light and camera at the end (cystoscope). The cystoscope may be hard or flexible, depending on the goal of the procedure. The cystoscope is inserted through the urethra, into the bladder. Cystoscopy may be recommended if you have:  Urinary tract infections that keep coming back.  Blood in the urine (hematuria).  An inability to control when you urinate (urinary incontinence) or an overactive bladder.  Unusual cells found in a urine sample.  A blockage in the urethra, such as a urinary stone.  Painful urination.  An abnormality in the bladder found during an intravenous pyelogram (IVP) or CT scan. Cystoscopy may also be done to remove a sample of tissue to be examined under a microscope (biopsy). What are the risks? Generally, this is a safe procedure. However, problems may occur, including:  Infection.  Bleeding.  What happens during the procedure?  1. You will be given one or more of the following: ? A medicine to numb the area (local anesthetic). 2. The area around the opening of your urethra will be cleaned. 3. The cystoscope will be passed through your urethra into your bladder. 4. Germ-free (sterile) fluid will flow through the cystoscope to fill your bladder. The fluid will stretch your bladder so that your health care provider can clearly examine your bladder walls. 5. Your doctor will look at the urethra and bladder. 6. The cystoscope will be removed The procedure may vary among health care providers  What can I expect after the procedure? After the procedure, it is common to have: 1. Some soreness or pain in your abdomen and urethra. 2. Urinary symptoms.  These include: ? Mild pain or burning when you urinate. Pain should stop within a few minutes after you urinate. This may last for up to 1 week. ? A small amount of blood in your urine for several days. ? Feeling like you need to urinate but producing only a small amount of urine. Follow these instructions at home: General instructions  Return to your normal activities as told by your health care provider.   Do not drive for 24 hours if you were given a sedative during your procedure.  Watch for any blood in your urine. If the amount of blood in your urine increases, call your health care provider.  If a tissue sample was removed for testing (biopsy) during your procedure, it is up to you to get your test results. Ask your health care provider, or the department that is doing the test, when your results will be ready.  Drink enough fluid to keep your urine pale yellow.  Keep all follow-up visits as told by your health care provider. This is important. Contact a health care provider if you:  Have pain that gets worse or does not get better with medicine, especially pain when you urinate.  Have trouble urinating.  Have more blood in your urine. Get help right away if you:  Have blood clots in your urine.  Have abdominal pain.  Have a fever or chills.  Are unable to urinate. Summary  Cystoscopy is a procedure that is used to help diagnose and sometimes treat conditions that affect the lower urinary tract.  Cystoscopy is done using   a thin, tube-shaped instrument with a light and camera at the end.  After the procedure, it is common to have some soreness or pain in your abdomen and urethra.  Watch for any blood in your urine. If the amount of blood in your urine increases, call your health care provider.  If you were prescribed an antibiotic medicine, take it as told by your health care provider. Do not stop taking the antibiotic even if you start to feel better. This  information is not intended to replace advice given to you by your health care provider. Make sure you discuss any questions you have with your health care provider. Document Revised: 12/11/2017 Document Reviewed: 12/11/2017 Elsevier Patient Education  2020 [employer]   

## 2020-06-12 LAB — CULTURE, URINE COMPREHENSIVE

## 2020-06-15 LAB — MYCOPLASMA / UREAPLASMA CULTURE
Mycoplasma hominis Culture: NEGATIVE
Ureaplasma urealyticum: NEGATIVE

## 2020-06-17 ENCOUNTER — Other Ambulatory Visit: Payer: Self-pay

## 2020-06-17 ENCOUNTER — Ambulatory Visit
Admission: RE | Admit: 2020-06-17 | Discharge: 2020-06-17 | Disposition: A | Discharge status: Home / Self Care | Payer: Medicare HMO | Source: Ambulatory Visit | Attending: Urology | Admitting: Urology

## 2020-06-17 ENCOUNTER — Encounter: Payer: Self-pay | Admitting: Urology

## 2020-06-17 ENCOUNTER — Ambulatory Visit (INDEPENDENT_AMBULATORY_CARE_PROVIDER_SITE_OTHER): Payer: Medicare HMO | Admitting: Urology

## 2020-06-17 VITALS — BP 138/73 | HR 86 | Ht 67.0 in | Wt 289.0 lb

## 2020-06-17 DIAGNOSIS — R3129 Other microscopic hematuria: Secondary | ICD-10-CM | POA: Insufficient documentation

## 2020-06-17 NOTE — Progress Notes (Signed)
   06/18/20  CC:  Chief Complaint  Patient presents with   Cysto    HPI: 70 year old female with microscopic hematuria who presents today for cystoscopic evaluation.  She is a current smoker.  She underwent renal ultrasound this morning which I personally reviewed the images  for, final radiologic interpretation is pending but appears unremarkable.  NED. A&Ox3.   No respiratory distress   Abd soft, NT, ND Normal external genitalia but there is significant atrophy and irritation of her internal labia and vagina.  Cystoscopy Procedure Note  Patient identification was confirmed, informed consent was obtained, and patient was prepped using Betadine solution.  Lidocaine jelly was administered per urethral meatus.    Procedure: - Flexible cystoscope introduced, without any difficulty.   - Thorough search of the bladder revealed:    normal urethral meatus    normal urothelium    no stones    no ulcers     no tumors    no urethral polyps    no trabeculation  - Ureteral orifices were normal in position and appearance.  Post-Procedure: - Patient tolerated the procedure well  Assessment/ Plan:  1. Microscopic hematuria Cystoscopy was unremarkable today as well as renal ultrasound which is reassuring  I will call her if the final read is different than my own interpretation  Suspect microscopic hematuria is related to external irritation and collection technique in the setting of morbid obesity and limited mobility.  Given her smoking status, I recommended that she return in 6 months for cath UA to assess for ongoing hematuria as well as rule out collection technique as the underlying cause.  She is agreeable this plan.  Return in 6 months for cath UA - Urinalysis, Complete   Hollice Espy, MD

## 2020-06-18 LAB — MICROSCOPIC EXAMINATION: Bacteria, UA: NONE SEEN

## 2020-06-18 LAB — URINALYSIS, COMPLETE
Bilirubin, UA: NEGATIVE
Glucose, UA: NEGATIVE
Ketones, UA: NEGATIVE
Nitrite, UA: NEGATIVE
Protein,UA: NEGATIVE
Specific Gravity, UA: 1.01 (ref 1.005–1.030)
Urobilinogen, Ur: 1 mg/dL (ref 0.2–1.0)
pH, UA: 7 (ref 5.0–7.5)

## 2020-10-19 DIAGNOSIS — Z981 Arthrodesis status: Secondary | ICD-10-CM | POA: Insufficient documentation

## 2020-11-11 DIAGNOSIS — M419 Scoliosis, unspecified: Secondary | ICD-10-CM | POA: Insufficient documentation

## 2020-12-16 ENCOUNTER — Ambulatory Visit: Payer: Self-pay | Admitting: Urology

## 2021-02-04 ENCOUNTER — Inpatient Hospital Stay: Admission: RE | Admit: 2021-02-04 | Payer: Medicare HMO | Source: Ambulatory Visit

## 2021-02-04 ENCOUNTER — Encounter: Payer: Self-pay | Admitting: *Deleted

## 2021-02-09 ENCOUNTER — Inpatient Hospital Stay: Payer: Medicare HMO

## 2021-02-09 ENCOUNTER — Inpatient Hospital Stay: Payer: Medicare HMO | Attending: Obstetrics and Gynecology | Admitting: Obstetrics and Gynecology

## 2021-02-09 ENCOUNTER — Other Ambulatory Visit: Payer: Self-pay

## 2021-02-09 DIAGNOSIS — I10 Essential (primary) hypertension: Secondary | ICD-10-CM | POA: Diagnosis not present

## 2021-02-09 DIAGNOSIS — F17210 Nicotine dependence, cigarettes, uncomplicated: Secondary | ICD-10-CM | POA: Insufficient documentation

## 2021-02-09 DIAGNOSIS — C541 Malignant neoplasm of endometrium: Secondary | ICD-10-CM | POA: Diagnosis not present

## 2021-02-09 DIAGNOSIS — F419 Anxiety disorder, unspecified: Secondary | ICD-10-CM | POA: Diagnosis not present

## 2021-02-09 DIAGNOSIS — D649 Anemia, unspecified: Secondary | ICD-10-CM | POA: Insufficient documentation

## 2021-02-09 DIAGNOSIS — G8929 Other chronic pain: Secondary | ICD-10-CM | POA: Diagnosis not present

## 2021-02-09 DIAGNOSIS — K449 Diaphragmatic hernia without obstruction or gangrene: Secondary | ICD-10-CM | POA: Diagnosis not present

## 2021-02-09 DIAGNOSIS — E119 Type 2 diabetes mellitus without complications: Secondary | ICD-10-CM | POA: Diagnosis not present

## 2021-02-09 DIAGNOSIS — N920 Excessive and frequent menstruation with regular cycle: Secondary | ICD-10-CM | POA: Diagnosis not present

## 2021-02-09 DIAGNOSIS — N950 Postmenopausal bleeding: Secondary | ICD-10-CM | POA: Insufficient documentation

## 2021-02-09 HISTORY — DX: Malignant neoplasm of endometrium: C54.1

## 2021-02-09 LAB — CBC WITH DIFFERENTIAL/PLATELET
Abs Immature Granulocytes: 0.06 10*3/uL (ref 0.00–0.07)
Basophils Absolute: 0.1 10*3/uL (ref 0.0–0.1)
Basophils Relative: 1 %
Eosinophils Absolute: 0.3 10*3/uL (ref 0.0–0.5)
Eosinophils Relative: 2 %
HCT: 34.3 % — ABNORMAL LOW (ref 36.0–46.0)
Hemoglobin: 11.4 g/dL — ABNORMAL LOW (ref 12.0–15.0)
Immature Granulocytes: 0 %
Lymphocytes Relative: 18 %
Lymphs Abs: 2.5 10*3/uL (ref 0.7–4.0)
MCH: 28.4 pg (ref 26.0–34.0)
MCHC: 33.2 g/dL (ref 30.0–36.0)
MCV: 85.3 fL (ref 80.0–100.0)
Monocytes Absolute: 0.8 10*3/uL (ref 0.1–1.0)
Monocytes Relative: 6 %
Neutro Abs: 9.9 10*3/uL — ABNORMAL HIGH (ref 1.7–7.7)
Neutrophils Relative %: 73 %
Platelets: 309 10*3/uL (ref 150–400)
RBC: 4.02 MIL/uL (ref 3.87–5.11)
RDW: 14.1 % (ref 11.5–15.5)
WBC: 13.6 10*3/uL — ABNORMAL HIGH (ref 4.0–10.5)
nRBC: 0 % (ref 0.0–0.2)

## 2021-02-09 LAB — COMPREHENSIVE METABOLIC PANEL
ALT: 9 U/L (ref 0–44)
AST: 12 U/L — ABNORMAL LOW (ref 15–41)
Albumin: 3.9 g/dL (ref 3.5–5.0)
Alkaline Phosphatase: 58 U/L (ref 38–126)
Anion gap: 9 (ref 5–15)
BUN: 15 mg/dL (ref 8–23)
CO2: 22 mmol/L (ref 22–32)
Calcium: 9.3 mg/dL (ref 8.9–10.3)
Chloride: 104 mmol/L (ref 98–111)
Creatinine, Ser: 0.66 mg/dL (ref 0.44–1.00)
GFR, Estimated: >60 mL/min (ref >60)
Glucose, Bld: 106 mg/dL — ABNORMAL HIGH (ref 70–99)
Potassium: 4.1 mmol/L (ref 3.5–5.1)
Sodium: 135 mmol/L (ref 135–145)
Total Bilirubin: <0.1 mg/dL — ABNORMAL LOW (ref 0.3–1.2)
Total Protein: 7.5 g/dL (ref 6.5–8.1)

## 2021-02-09 LAB — IRON AND TIBC
Iron: 23 ug/dL — ABNORMAL LOW (ref 28–170)
Saturation Ratios: 7 % — ABNORMAL LOW (ref 10.4–31.8)
TIBC: 322 ug/dL (ref 250–450)
UIBC: 299 ug/dL

## 2021-02-09 LAB — FERRITIN: Ferritin: 23 ng/mL (ref 11–307)

## 2021-02-09 LAB — HEMOGLOBIN A1C
Hgb A1c MFr Bld: 6.2 % — ABNORMAL HIGH (ref 4.8–5.6)
Mean Plasma Glucose: 131.24 mg/dL

## 2021-02-09 NOTE — Progress Notes (Signed)
Gynecologic Oncology Consult Visit   Referring Provider: Dr Leafy Ro  Chief Concern: endometrial cancer, grade 1, menorrhagia  Subjective:  Selena Rodgers is a 71 y.o. G57 female who is seen in consultation from Dr. Leafy Ro for endometrial cancer.  Seen for PMB in 11/22 by Dr Leafy Ro.  Prior endometrial ablation in 2000.   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  Took some Megace, but bleeding got worse, so stopped.  Bad back limits her mobility. Chronic pain.   She has gigantic lower abdominal hernia s/p prior unsuccessful repair with mesh due to incarcerated bowel.    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   Ventral hernia 10/07/2014   Current tobacco use 10/06/2014   Anxiety 09/17/2014   Back pain, chronic 09/17/2014   Clinical depression 09/17/2014   BP (high blood pressure) 09/17/2014   Borderline diabetes 09/17/2014   Arthritis, degenerative 09/17/2014   Spinal stenosis 09/17/2014   Compulsive tobacco user syndrome 09/17/2014   Abdominal mass 06/30/2014   Incisional hernia, without obstruction or gangrene 06/30/2014   Morbid obesity ([place]) 06/30/2014   Hypercholesteremia 05/23/2013   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   Hypercholesterolemia 05/23/2013   Past Medical History: Past Medical History:  Diagnosis Date   Anxiety    Arthritis    Chronic back pain     Depression    GERD (gastroesophageal reflux disease)    Hypertension    Post-menopausal bleeding    Spinal stenosis    Umbilical hernia    Uterine cancer ([place])    Ventral hernia     Past Surgical History: Past Surgical History:  Procedure Laterality Date   ANTERIOR CERVICAL DECOMP/DISCECTOMY FUSION N/A 04/19/2015   Procedure: ANTERIOR CERVICAL DECOMPRESSION/DISCECTOMY FUSION INTERBODY PROTHESIS PLATING BONEGRAFT CERVICAL THREE-FOUR ,CERVICAL FOUR-FIVE;  Surgeon: Newman Pies, MD;  Location: [facility];  Service: Neurosurgery;  Laterality: N/A;   BREAST BIOPSY     CARPAL TUNNEL RELEASE     CHOLECYSTECTOMY     FOOT SURGERY Right    IRRIGATION AND DEBRIDEMENT ABDOMEN  01/10/2014   Dr. Marina Gravel   UMBILICAL HERNIA REPAIR     VENTRAL HERNIA REPAIR  12/26/2013   Dr. Pat Patrick   WOUND EXPLORATION N/A 07/14/2019   Procedure: LUMBAR EXPLORATION EVACUATION OF HEMATOMA;  Surgeon: Newman Pies, MD;  Location: [facility];  Service: Neurosurgery;  Laterality: N/A;     OB History:  OB History  No obstetric history on file.    Family History: Family History  Problem Relation Age of Onset   Diabetes Maternal Grandmother    Diabetes Maternal Grandfather    Diabetes Paternal Grandmother    Diabetes Paternal Grandfather    Hypertension Mother     Social History: Social History   Socioeconomic History   Marital status: Single    Spouse name: Not on file   Number of children: Not on  file   Years of education: Not on file   Highest education level: Not on file  Occupational History   Not on file  Tobacco Use   Smoking status: Some Days   Smokeless tobacco: Never   Tobacco comments:    5 cigs daily  Vaping Use   Vaping Use: Never used  Substance and Sexual Activity   Alcohol use: No    Alcohol/week: 0.0 standard drinks   Drug use: No   Sexual activity: Not on file  Other Topics Concern   Not on file  Social History Narrative   Not on file   Social Determinants of Health    Financial Resource Strain: Not on file  Food Insecurity: Not on file  Transportation Needs: Not on file  Physical Activity: Not on file  Stress: Not on file  Social Connections: Not on file  Intimate Partner Violence: Not on file    Allergies: Allergies  Allergen Reactions   Chlorpheniramine Anaphylaxis   Linseed Oil Anaphylaxis   Codeine Hives   Flax Seed Oil [Bio-Flax] Swelling    UNSPECIFIED    Latex Hives and Other (See Comments)    BLISTERS   Tape Other (See Comments)    BLISTERS   Tapentadol Other (See Comments)    BLISTERS   Rosemary Oil Swelling    Lip swelling   2,4-D Dimethylamine (Amisol) Rash    T-DAP   Aspirin Rash    Current Medications: Current Outpatient Medications  Medication Sig Dispense Refill   acetaminophen (TYLENOL) 500 MG tablet Take 2 tablets (1,000 mg total) by mouth 3 (three) times daily. 30 tablet 0   citalopram (CELEXA) 20 MG tablet Take 20 mg by mouth daily.     megestrol (MEGACE) 40 MG tablet Take 40 mg by mouth daily.     meloxicam (MOBIC) 15 MG tablet Take 15 mg by mouth daily.     metFORMIN (GLUCOPHAGE) 500 MG tablet Take 500 mg by mouth 2 (two) times daily.     omeprazole (PRILOSEC) 40 MG capsule Take 40 mg by mouth daily.      vitamin B-12 (CYANOCOBALAMIN) 1000 MCG tablet Take 1,000 mcg by mouth daily.     No current facility-administered medications for this visit.    Review of Systems General: negative for, fevers, chills, fatigue, changes in sleep Skin: negative for changes in color, texture, moles or lesions Eyes: negative for, changes in vision, pain, diplopia HEENT: negative for, change in hearing, pain, discharge, tinnitus, vertigo, voice changes, sore throat, neck masses Pulmonary: negative for, dyspnea, orthopnea, productive cough Cardiac: negative for, palpitations, syncope, pain, discomfort, pressure Gastrointestinal: negative for, dysphagia, nausea, vomiting, jaundice, pain, constipation, diarrhea, hematemesis,  hematochezia Genitourinary/Sexual: negative for, dysuria, discharge, hesitancy, nocturia, retention, stones, infections, STD's, incontinence Ob/Gyn: negative for, pain Musculoskeletal: negative for, pain, stiffness, swelling, range of motion limitation Hematology: negative for, easy bruising, bleeding Neurologic/Psych: negative for, headaches, seizures, paralysis, weakness, tremor, change in gait, change in sensation, mood swings, depression, anxiety, change in memory  Objective:  Physical Examination:  BP (!) 156/79    Pulse 85    Temp 98.7 F (37.1 C)    Resp 20    Wt 282 lb 1.6 oz (128 kg)    SpO2 100%    BMI 44.18 kg/m    ECOG Performance Status: 1 - Symptomatic but completely ambulatory  General appearance: alert, cooperative, and appears stated age HEENT:PERRLA and neck supple with midline trachea Lymph node survey: non-palpable, axillary, inguinal, supraclavicular Cardiovascular: regular rate  and rhythm, no murmurs or gallops Respiratory: normal air entry, lungs clear to auscultation and no rales, rhonchi or wheezing Abdomen: soft, massive hernia in lower abdomen. Back: inspection of back is normal Extremities: extremities normal, atraumatic, no cyanosis or edema Skin exam - normal coloration and turgor, no rashes, no suspicious skin lesions noted. Neurological exam reveals alert, oriented, normal speech, no focal findings or movement disorder noted.  Pelvic: exam chaperoned by nurse, EGBUS within normal limits, normal vagina and vulva;  Vulva: normal appearing vulva with no masses, tenderness or lesions; Vagina: normal vagina; Adnexa: normal adnexa in size, nontender and no masses; Uterus: uterus is normal size, shape, consistency and nontender; Cervix: anteverted; Rectal: not indicated    Component    WBC (White Blood Cell Count)  RBC (Red Blood Cell Count)  Hemoglobin  Hematocrit  MCV (Mean Corpuscular Volume)  MCH (Mean Corpuscular Hemoglobin)  MCHC (Mean Corpuscular  Hemoglobin Concentration)  Platelet Count  RDW-CV (Red Cell Distribution Width)  MPV (Mean Platelet Volume)  Immature Granulocyte Count   Component 09/22/2020 03/23/2020 09/22/2019 09/27/2018          WBC (White Blood Cell Count) 8.7 12.0 High    9.0 11.8 High    Load older lab results  RBC (Red Blood Cell Count) 4.37 4.63 4.53 4.96 Load older lab results  Hemoglobin 12.5 13.2 12.8 14.9 Load older lab results  Hematocrit 38.4 41.2 40.0 45.7 Load older lab results  MCV (Mean Corpuscular Volume) 87.9 89.0 88.3 92.1 Load older lab results  MCH (Mean Corpuscular Hemoglobin) 28.6 28.5 28.3 30.0 Load older lab results  MCHC (Mean Corpuscular Hemoglobin Concentration) 32.6 32.0 32.0 32.6 Load older lab results  Platelet Count 286 281 272 280 Load older lab results  RDW-CV (Red Cell Distribution Width) 14.1 14.1 14.6 13.8 Load older lab results  MPV (Mean Platelet Volume) 10.3 10.5 10.5 10.7 Load older lab results  Immature Granulocyte Count 0.01 0.03 0.02 0.03 Load older lab results   Component    Glucose  Sodium  Potassium  Chloride  Carbon Dioxide (CO2)  Urea Nitrogen (BUN)  Creatinine  Glomerular Filtration Rate (eGFR), MDRD Estimate  Calcium  AST   ALT   Alk Phos (alkaline Phosphatase)  Albumin  Bilirubin, Total  Protein, Total  A/G Ratio  BUN/Crea Ratio  Anion Gap w/K   Component 09/22/2020 03/23/2020 09/22/2019 03/21/2019 09/27/2018           Glucose 162 High    134 High    137 High    131 High    120 High    Load older lab results  Sodium 136 137 138 138 136 Load older lab results  Potassium 4.4 4.5 5.1 4.5 4.8 Load older lab results  Chloride 103 103 103 101 99 Load older lab results  Carbon Dioxide (CO2) 25.4 29.0 29.5 29.7 30.5 Load older lab results  Urea Nitrogen (BUN) 14 16 15 14 17  Load older lab results  Creatinine 0.6 0.6 0.6 0.7 0.8 Load older lab results  Glomerular Filtration Rate (eGFR), MDRD Estimate 99 100 100 84 72 Load older lab results   Calcium 9.7 9.9 10.0 9.8 10.0 Load older lab results  AST  9 -- 10 -- 17 Load older lab results  ALT  7 -- 8 -- 31 Load older lab results  Alk Phos (alkaline Phosphatase) 69 -- 90 -- 71 Load older lab results  Albumin 4.1 -- 4.1 -- 4.3 Load older lab results  Bilirubin, Total 0.4 -- 0.4 --  0.7 Load older lab results  Protein, Total 7.2 -- 7.2 -- 7.2 Load older lab results  A/G Ratio 1.3 -- 1.3 -- 1.5 Load older lab results  BUN/Crea Ratio -- 26.7 High    -- 20.0 -- Load older lab results  Anion Gap w/K -- 9.5 -- 11.8 -- Load older lab results      Assessment:  Selena Rodgers is a 71 y.o. G9 female diagnosed with grade 1 endometrial adenocarcinoma. Light bleeding now and normal pulse.   Medical co-morbidities complicating care: Massive abdominal hernia, AODM, morbid obesity, chronic back pain.  Plan:   Problem List Items Addressed This Visit       Genitourinary   Endometrial cancer [facility])   We discussed options for management including surgery, radiation and hormonal therapy.  She has a very large recurrent hernia in the lower abdomen that would make hysterectomy difficult with significant risk of postop wound complications.     Discussed that most grade 1 endometrial cancer is generally a well behaved disease and can be controlled with a Mirena progestin containing IUD.  We will plan to do a pelvic MRI to rule out deep myometrial invasion and evidence of LN or other metastases. If reassuring, Dr Leafy Ro will do therapeutic D&C and MIrena IUD placement.  Will plan to repeat [place] biopsies q79mo  Also discussed potential options of vaginal hysterectomy, which would be difficult due to her size and nulliparous pelvis.  And also discussed the potential utility or radiation.  Will check CBC and HgbA1C today.    Suggested return to clinic 3 months after IUD placement for repeat biopsy.    The patient's diagnosis, an outline of the further diagnostic and laboratory studies which will  be required, the recommendation, and alternatives were discussed.  All questions were answered to the patient's satisfaction.  AMellody Drown MD   CC:  BBenjaman Kindler MLawrence[place]WhalanBRobersonville  [place] [phone]

## 2021-02-11 ENCOUNTER — Encounter: Admission: RE | Payer: Self-pay | Source: Home / Self Care

## 2021-02-11 ENCOUNTER — Ambulatory Visit
Admission: RE | Admit: 2021-02-11 | Payer: Medicare HMO | Source: Home / Self Care | Admitting: Obstetrics and Gynecology

## 2021-02-11 SURGERY — DILATATION AND CURETTAGE /HYSTEROSCOPY
Anesthesia: Choice

## 2021-02-15 ENCOUNTER — Ambulatory Visit
Admission: RE | Admit: 2021-02-15 | Discharge: 2021-02-15 | Disposition: A | Discharge status: Home / Self Care | Payer: Medicare HMO | Source: Ambulatory Visit | Attending: Nurse Practitioner | Admitting: Nurse Practitioner

## 2021-02-15 DIAGNOSIS — C541 Malignant neoplasm of endometrium: Secondary | ICD-10-CM

## 2021-02-15 MED ORDER — GADOBENATE DIMEGLUMINE 529 MG/ML IV SOLN
20.0000 mL | Freq: Once | INTRAVENOUS | Status: AC | PRN
  Administered 2021-02-15: 20 mL via INTRAVENOUS

## 2021-02-16 ENCOUNTER — Encounter: Payer: Self-pay | Admitting: Nurse Practitioner

## 2021-02-16 NOTE — Progress Notes (Signed)
MRI Results emailed to Dr Fransisca Connors.

## 2021-02-17 ENCOUNTER — Telehealth: Payer: Self-pay | Admitting: *Deleted

## 2021-02-17 ENCOUNTER — Other Ambulatory Visit: Payer: Medicare HMO

## 2021-02-17 ENCOUNTER — Other Ambulatory Visit: Payer: Self-pay | Admitting: *Deleted

## 2021-02-17 ENCOUNTER — Telehealth: Payer: Self-pay

## 2021-02-17 ENCOUNTER — Encounter: Payer: Self-pay | Admitting: Nurse Practitioner

## 2021-02-17 DIAGNOSIS — C541 Malignant neoplasm of endometrium: Secondary | ICD-10-CM

## 2021-02-17 NOTE — Telephone Encounter (Signed)
Received call from sister, Joycelyn Schmid, regarding results of MRI. Discussed MRI results and that images have been sent to [facility] for review at their weekly tumor board. All questions answered. She also reports that she is passing very large blood clots and she states she feels like she is going to pass out at times. Looks pale in color but reports she always looks pale. Spoke with NP and she can come tomorrow for blood work and NP visit. A scheduler will reach out with appointment details. She was encouraged to utilize the ED overnight if she feels she needs to be seen sooner for bleeding.

## 2021-02-17 NOTE — Progress Notes (Signed)
MRI pelvis imaging sent to [facility]. Discussed with Carl Best, RN who will have imaging reviewed at Dr. Blake Divine request. Patient also to be discussed at [facility] at Dr. Blake Divine request.

## 2021-02-17 NOTE — Telephone Encounter (Signed)
I have spoken to her sister, Joycelyn Schmid. She is also being arranged a visit tomorrow with Ander Purpura.

## 2021-02-17 NOTE — Telephone Encounter (Signed)
Patient called asking for results of MRI and wants to know what the next steps are to be. Her next appointment is not until June 7th  IMPRESSION: 1. Signs of diffuse endometrial cancer within the uterus up to 90% of the myometrial thickness is involved. Tumor appears to extend to serosa in the RIGHT parametrium. Potential nodular area extending beyond the uterine fundus as well though on sagittal postcontrast images this cannot be connected to subjacent tumor within the uterus in appears separated from tumor by myometrium. 2. Indeterminate area in the RIGHT pelvis follows broad ligament and may represent an atrophic ovary. Given above findings PET scan may be helpful for complete staging. 3. Large hiatal hernia with 13 cm rectus separation above the symphysis pubis.     Electronically Signed   By: Zetta Bills M.D.   On: 02/16/2021 16:23

## 2021-02-18 ENCOUNTER — Inpatient Hospital Stay: Payer: Medicare HMO

## 2021-02-18 ENCOUNTER — Ambulatory Visit
Admission: RE | Admit: 2021-02-18 | Discharge: 2021-02-18 | Disposition: A | Discharge status: Home / Self Care | Payer: Medicare HMO | Source: Ambulatory Visit | Attending: Radiation Oncology | Admitting: Radiation Oncology

## 2021-02-18 ENCOUNTER — Inpatient Hospital Stay (HOSPITAL_BASED_OUTPATIENT_CLINIC_OR_DEPARTMENT_OTHER): Payer: Medicare HMO | Admitting: Nurse Practitioner

## 2021-02-18 ENCOUNTER — Other Ambulatory Visit: Payer: Self-pay | Admitting: Emergency Medicine

## 2021-02-18 ENCOUNTER — Other Ambulatory Visit: Payer: Self-pay

## 2021-02-18 VITALS — BP 94/65 | HR 116 | Temp 98.2°F | Resp 20 | Wt 282.0 lb

## 2021-02-18 DIAGNOSIS — N939 Abnormal uterine and vaginal bleeding, unspecified: Secondary | ICD-10-CM

## 2021-02-18 DIAGNOSIS — F411 Generalized anxiety disorder: Secondary | ICD-10-CM | POA: Diagnosis not present

## 2021-02-18 DIAGNOSIS — C801 Malignant (primary) neoplasm, unspecified: Secondary | ICD-10-CM | POA: Diagnosis not present

## 2021-02-18 DIAGNOSIS — C541 Malignant neoplasm of endometrium: Secondary | ICD-10-CM

## 2021-02-18 DIAGNOSIS — Z510 Encounter for antineoplastic radiation therapy: Secondary | ICD-10-CM | POA: Insufficient documentation

## 2021-02-18 LAB — CBC WITH DIFFERENTIAL/PLATELET
Abs Immature Granulocytes: 0.05 10*3/uL (ref 0.00–0.07)
Basophils Absolute: 0.1 10*3/uL (ref 0.0–0.1)
Basophils Relative: 1 %
Eosinophils Absolute: 0.2 10*3/uL (ref 0.0–0.5)
Eosinophils Relative: 2 %
HCT: 26.3 % — ABNORMAL LOW (ref 36.0–46.0)
Hemoglobin: 8.4 g/dL — ABNORMAL LOW (ref 12.0–15.0)
Immature Granulocytes: 0 %
Lymphocytes Relative: 17 %
Lymphs Abs: 2.2 10*3/uL (ref 0.7–4.0)
MCH: 27.9 pg (ref 26.0–34.0)
MCHC: 31.9 g/dL (ref 30.0–36.0)
MCV: 87.4 fL (ref 80.0–100.0)
Monocytes Absolute: 0.6 10*3/uL (ref 0.1–1.0)
Monocytes Relative: 4 %
Neutro Abs: 9.7 10*3/uL — ABNORMAL HIGH (ref 1.7–7.7)
Neutrophils Relative %: 76 %
Platelets: 364 10*3/uL (ref 150–400)
RBC: 3.01 MIL/uL — ABNORMAL LOW (ref 3.87–5.11)
RDW: 14.1 % (ref 11.5–15.5)
WBC: 12.7 10*3/uL — ABNORMAL HIGH (ref 4.0–10.5)
nRBC: 0 % (ref 0.0–0.2)

## 2021-02-18 LAB — SAMPLE TO BLOOD BANK

## 2021-02-18 LAB — FERRITIN: Ferritin: 19 ng/mL (ref 11–307)

## 2021-02-18 LAB — IRON AND TIBC
Iron: 22 ug/dL — ABNORMAL LOW (ref 28–170)
Saturation Ratios: 7 % — ABNORMAL LOW (ref 10.4–31.8)
TIBC: 333 ug/dL (ref 250–450)
UIBC: 311 ug/dL

## 2021-02-18 LAB — PREPARE RBC (CROSSMATCH)

## 2021-02-18 IMAGING — MR MR THORACIC SPINE W/O CM
6 series · 37 of 48 positions shown · non-contrast
Comparison: None.

CLINICAL DATA: Back pain.  History of recent lumbar fusion.

EXAM:
MRI THORACIC SPINE WITHOUT CONTRAST
TECHNIQUE: Multiplanar, multisequence MR imaging of the thoracic spine was
performed. No intravenous contrast was administered.

[Series 17: T1 · sagittal · 5.0mm · 1.88mm/px · 3 of 9 slices shown (1 of 2)]
[im 1/9]
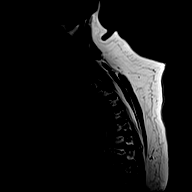
[im 5/9]
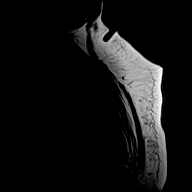
[im 9/9]
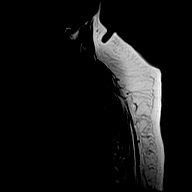

[Series 19: T2 · sagittal · 3.0mm · 1.33mm/px · 7 of 21 slices shown (1 of 2)]
[im 1/21]
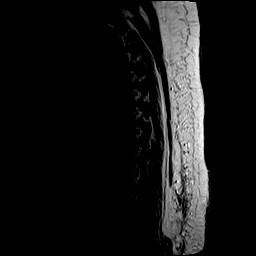
[im 4/21]
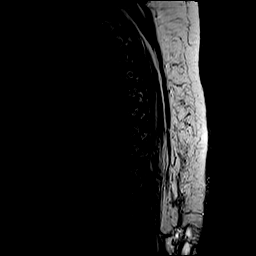
[im 7/21]
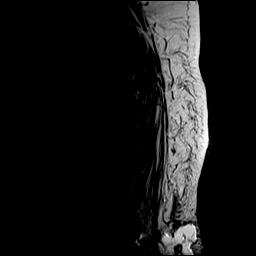
[im 11/21]
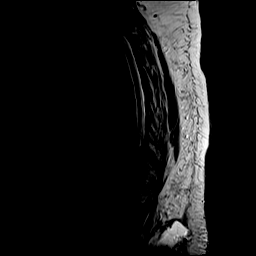
[im 14/21]
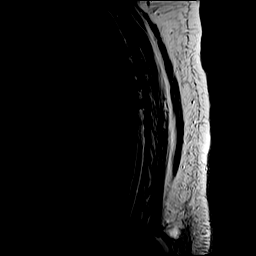
[im 17/21]
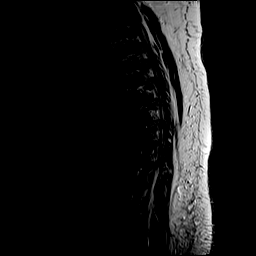
[im 21/21]
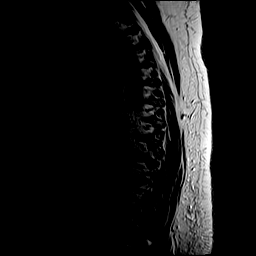

[Series 20: T1 · sagittal · 3.0mm · 1.33mm/px · 7 of 21 slices shown (2 of 2)]
[im 1/21]
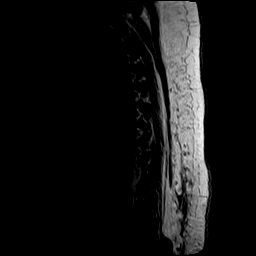
[im 4/21]
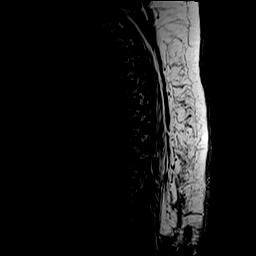
[im 7/21]
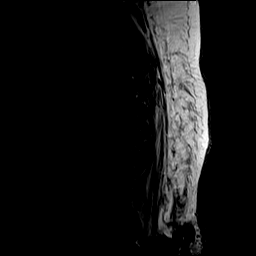
[im 11/21]
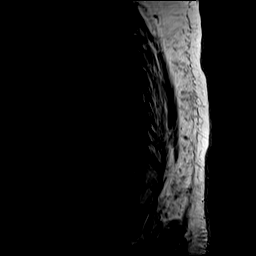
[im 14/21]
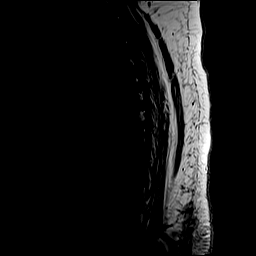
[im 17/21]
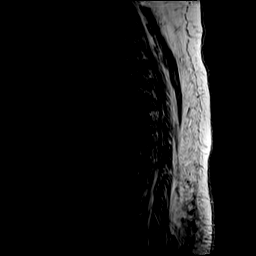
[im 21/21]
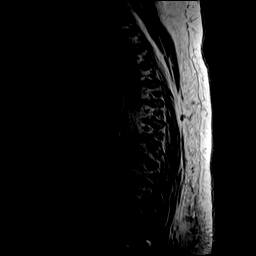

[Series 21: STIR · sagittal · 3.0mm · 0.66mm/px · 7 of 21 slices shown]
[im 1/21]
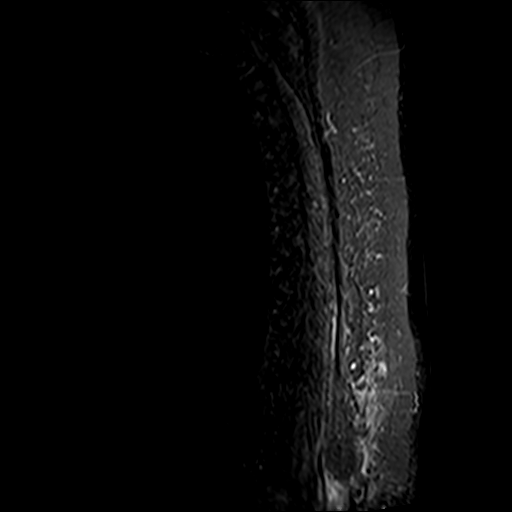
[im 4/21]
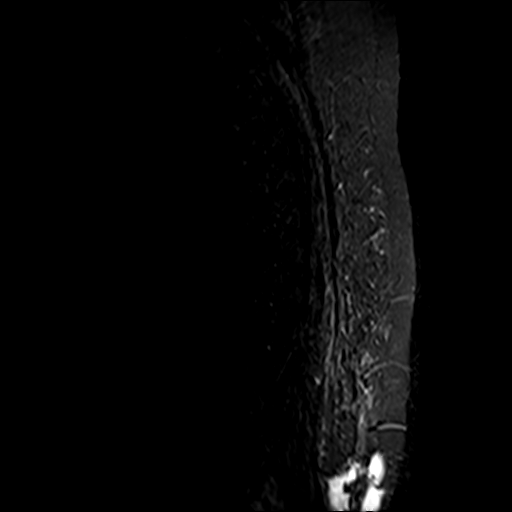
[im 7/21]
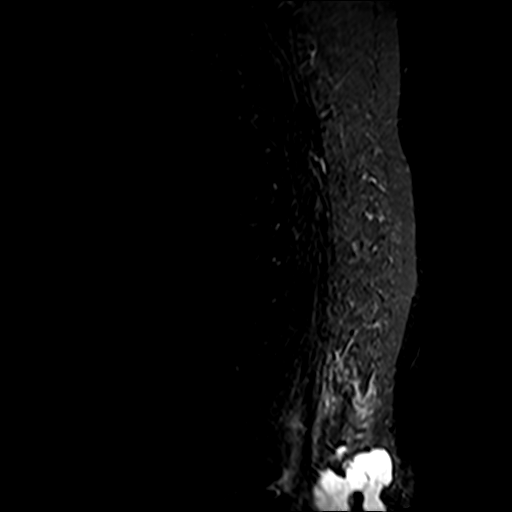
[im 11/21]
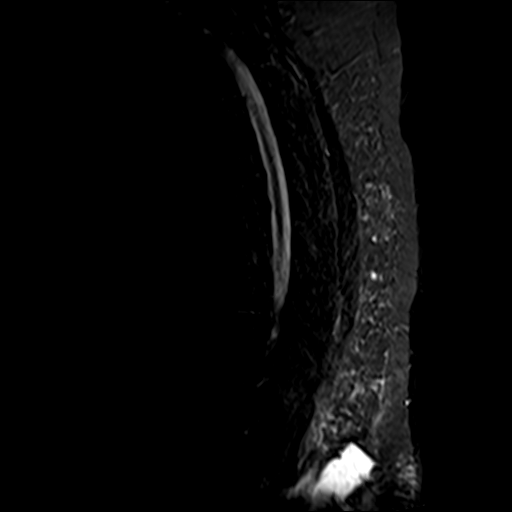
[im 14/21]
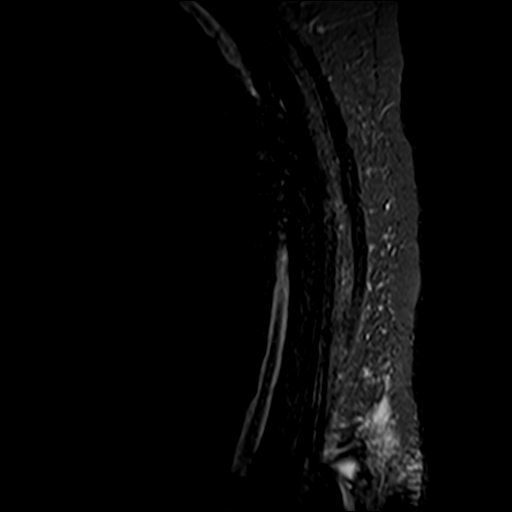
[im 17/21]
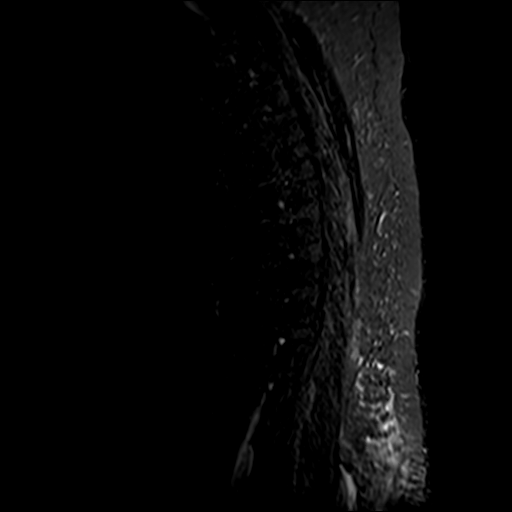
[im 21/21]
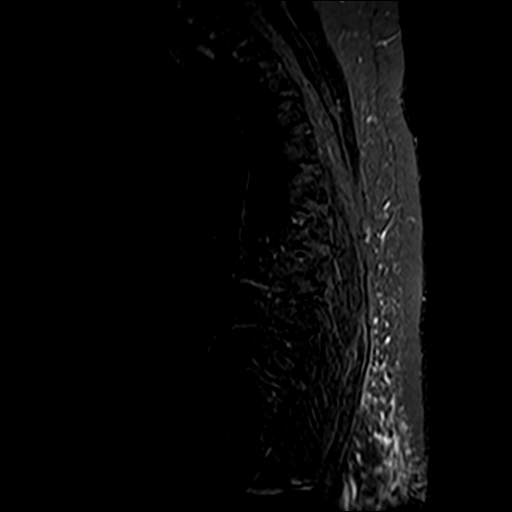

[Series 22: T2 · axial · 4.0mm · 0.59mm/px · z∈[-330,-128]mm · 9 of 39 slices shown (2 of 2)]
[im 1/39]
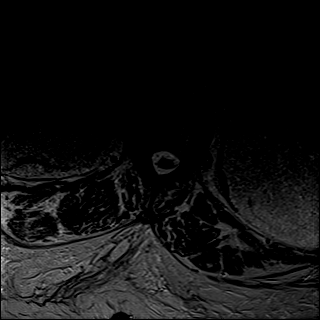
[im 7/39]
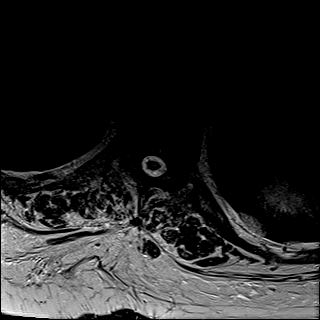
[im 11/39]
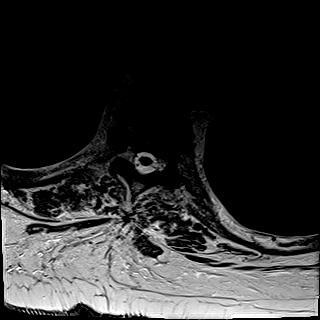
[im 18/39]
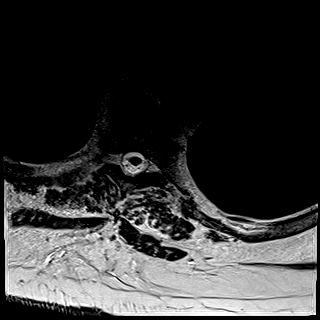
[im 21/39]
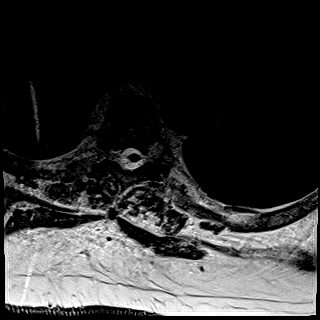
[im 28/39]
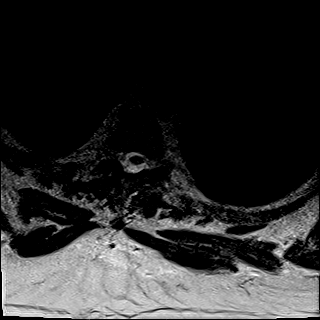
[im 32/39]
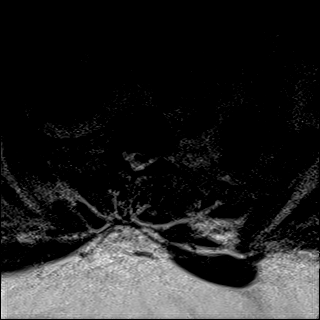
[im 35/39]
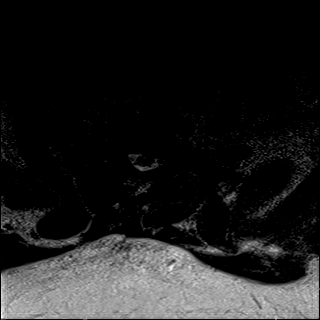
[im 39/39]
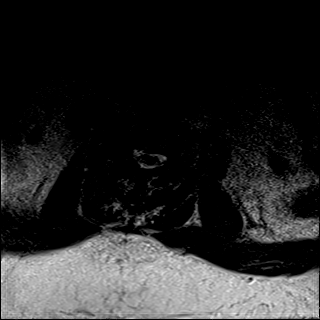

[Series 23: GRE · axial · 4.0mm · 0.37mm/px · z∈[-330,-223]mm · 4 of 39 slices shown]
[im 1/39]
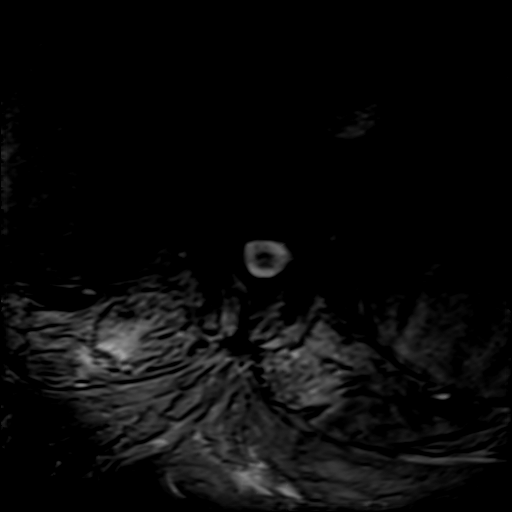
[im 7/39]
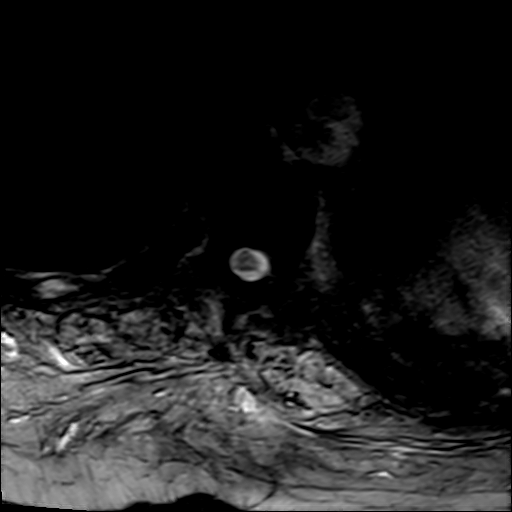
[im 11/39]
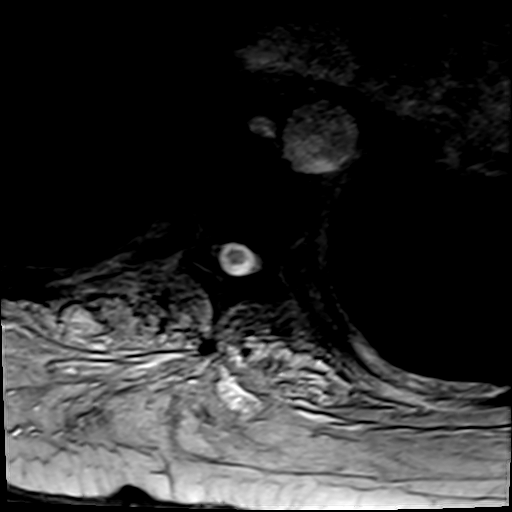
[im 18/39]
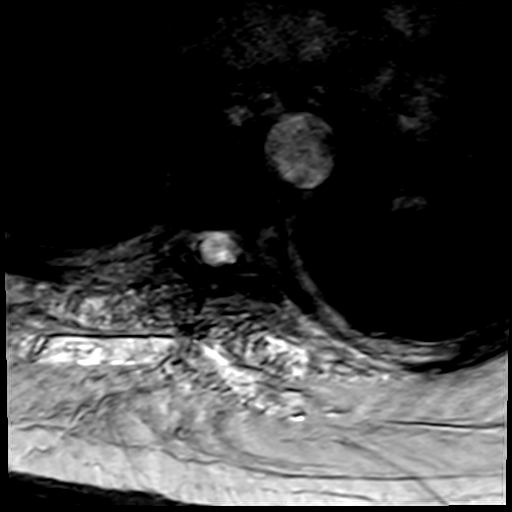

[37 of 48 positions shown; findings below may reference images not displayed]

FINDINGS: Alignment:  Normal

Vertebrae: No bone lesions or fractures. No findings suspicious for
discitis or osteomyelitis.

Cord: Grossly normal cord signal intensity. No cord lesions or
syrinx.

Paraspinal and other soft tissues: No significant paraspinal or
retroperitoneal findings.

Disc levels:

Small scattered thoracic disc protrusions but no significant disc
protrusions, spinal or foraminal stenosis.
IMPRESSION: 1. No significant thoracic disc protrusions, spinal or foraminal
stenosis.
2. No findings suspicious for discitis or osteomyelitis.
3. Normal appearance of the thoracic spinal cord.

## 2021-02-18 IMAGING — MR MR LUMBAR SPINE W/O CM
5 series · 33 of 48 positions shown · non-contrast
Comparison: Lumbar spine MRI 06/03/2019

CLINICAL DATA: History of lumbar fusion approximately 3 weeks ago.
Patient with back pain.

EXAM:
MRI LUMBAR SPINE WITHOUT CONTRAST
TECHNIQUE: Multiplanar, multisequence MR imaging of the lumbar spine was
performed. No intravenous contrast was administered.

[Series 1: T2 · sagittal · 4.0mm · 1.09mm/px · 6 of 17 slices shown (1 of 2)]
[im 1/17]
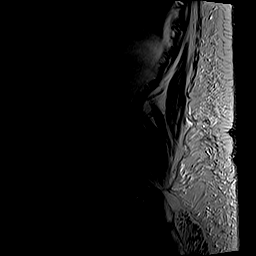
[im 4/17]
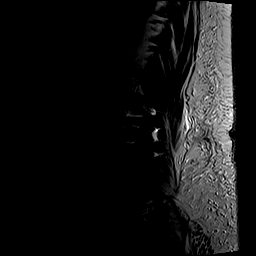
[im 7/17]
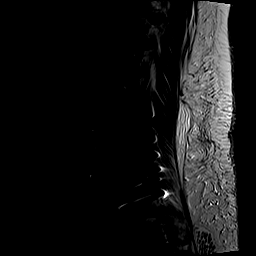
[im 10/17]
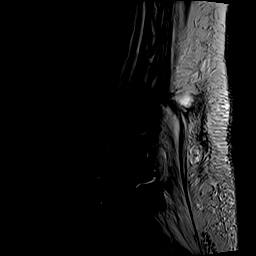
[im 13/17]
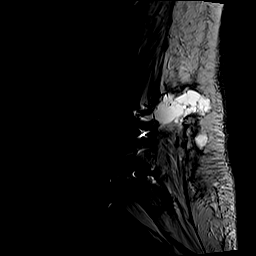
[im 17/17]
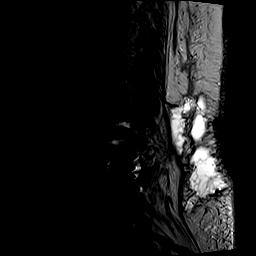

[Series 2: T1 · sagittal · 4.0mm · 1.09mm/px · 6 of 17 slices shown (1 of 2)]
[im 1/17]
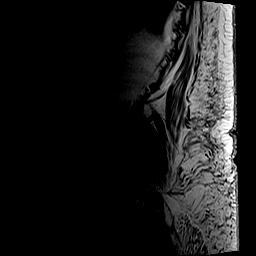
[im 4/17]
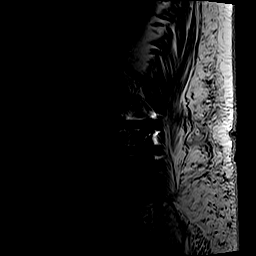
[im 7/17]
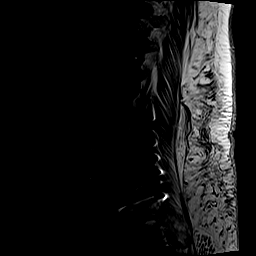
[im 10/17]
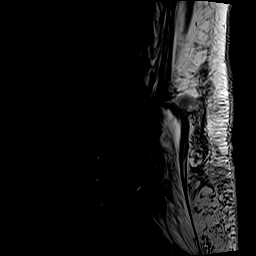
[im 13/17]
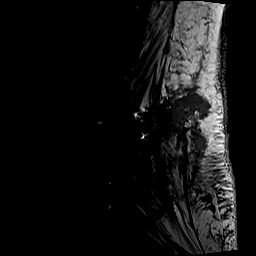
[im 17/17]
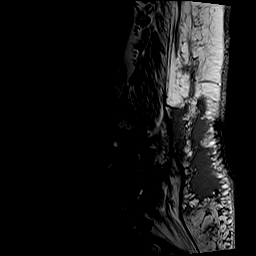

[Series 3: STIR · sagittal · 4.0mm · 0.55mm/px · 3 of 17 slices shown]
[im 1/17]
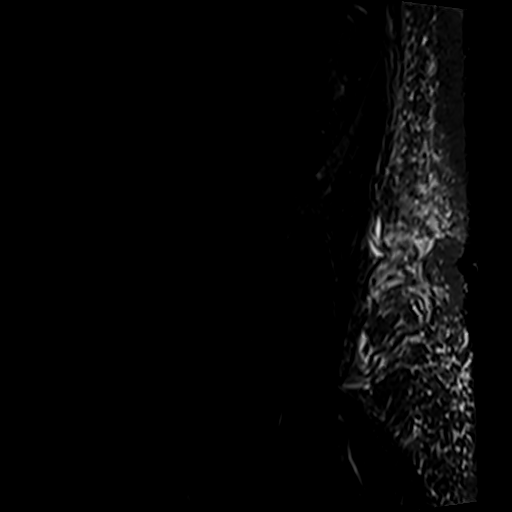
[im 4/17]
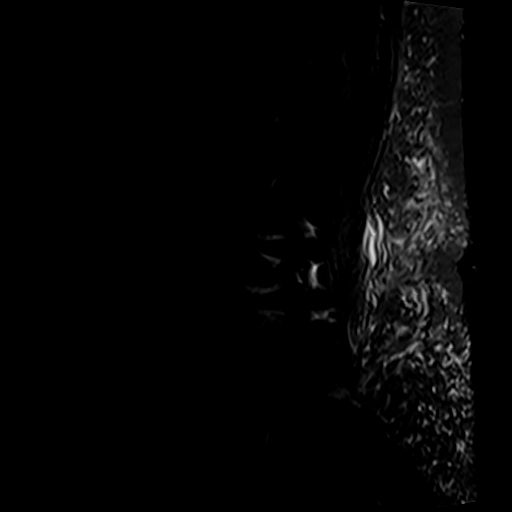
[im 7/17]
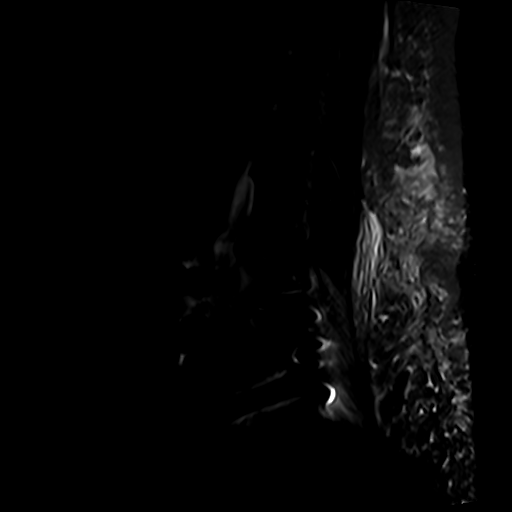

[Series 4: T2 · axial · 4.0mm · 0.94mm/px · z∈[-535,-315]mm · 9 of 45 slices shown (2 of 2)]
[im 1/45]
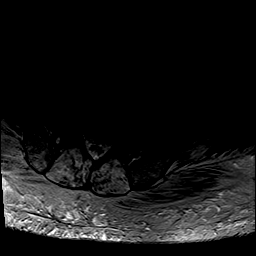
[im 7/45]
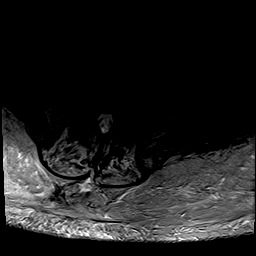
[im 13/45]
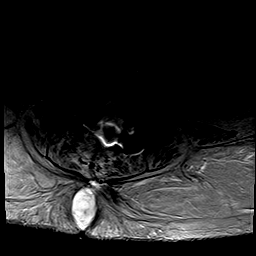
[im 19/45]
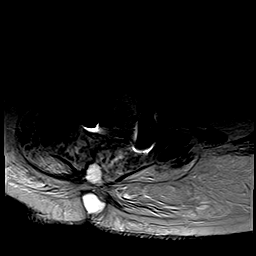
[im 23/45]
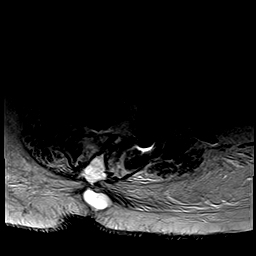
[im 26/45]
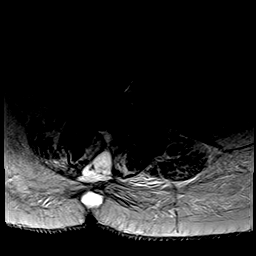
[im 32/45]
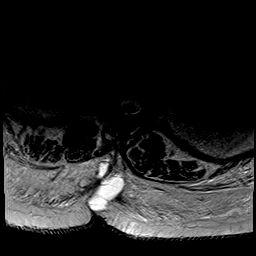
[im 38/45]
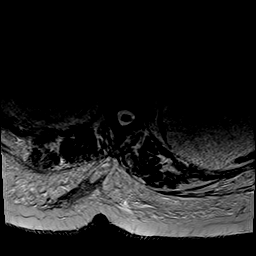
[im 45/45]
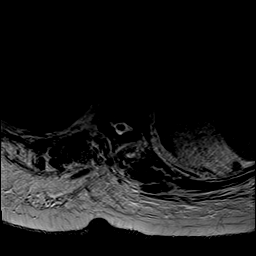

[Series 5: T1 · axial · 4.0mm · 0.47mm/px · z∈[-535,-315]mm · 9 of 45 slices shown (2 of 2)]
[im 1/45]
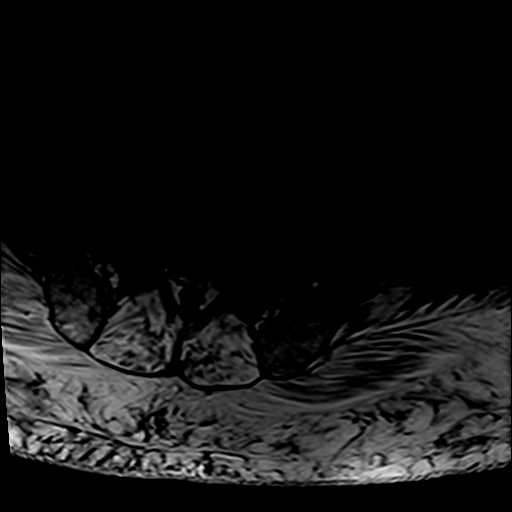
[im 7/45]
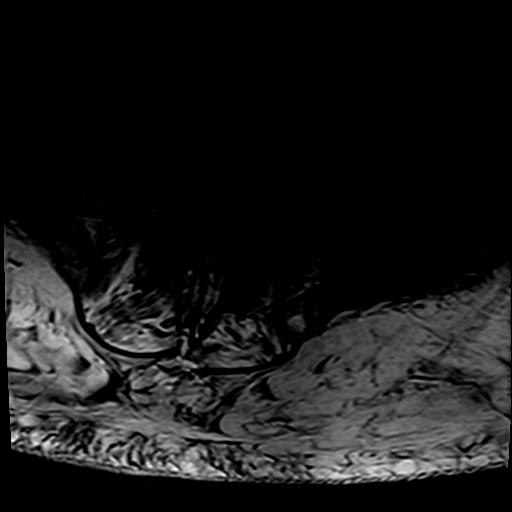
[im 13/45]
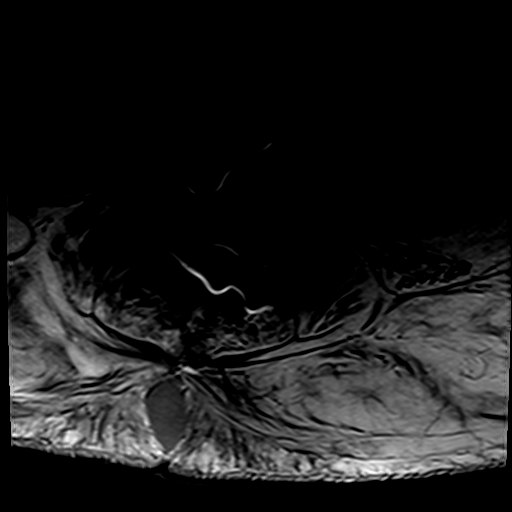
[im 19/45]
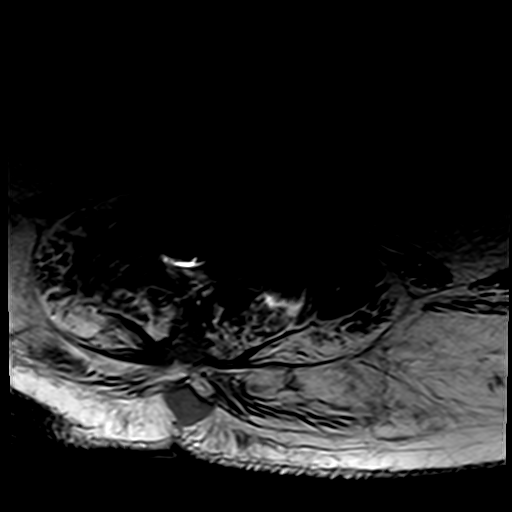
[im 23/45]
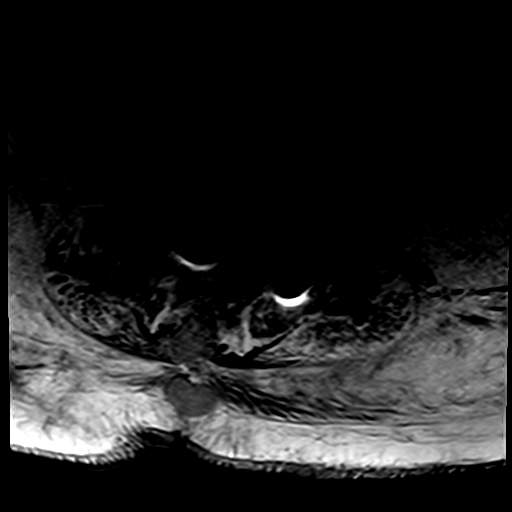
[im 26/45]
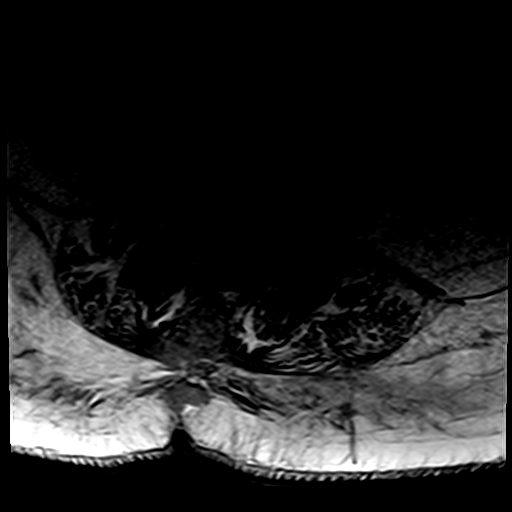
[im 32/45]
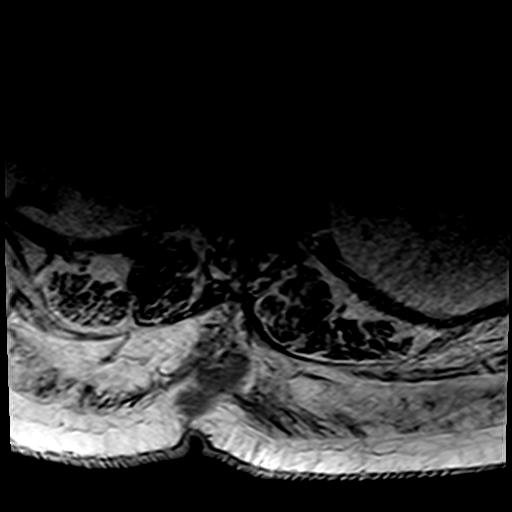
[im 38/45]
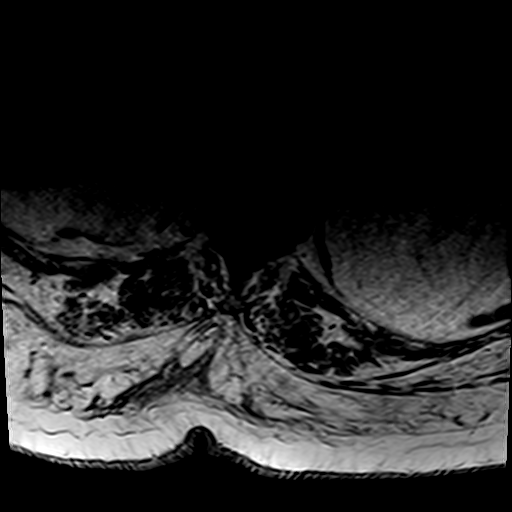
[im 45/45]
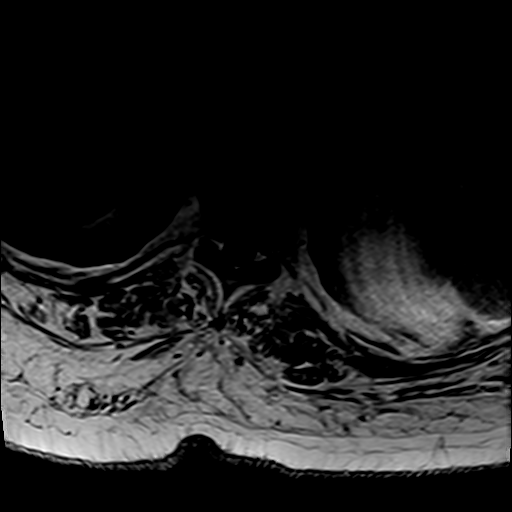

[33 of 48 positions shown; findings below may reference images not displayed]

FINDINGS: Segmentation: There are five lumbar type vertebral bodies. The last
full intervertebral disc space is labeled L5-S1. L2-L5 fusion
hardware noted.

Alignment:  Normal overall alignment.

Vertebrae: Significant artifact from the hardware but no obvious
acute bony findings.

Conus medullaris and cauda equina: Conus extends to the L1-2 level.
Conus and cauda equina appear normal.

Paraspinal and other soft tissues: There is a large complex fluid
collection in the laminectomy bed from L2-L5. It measures
approximately 6.6 x 4.0 x 4.1 cm. No significant increased T1 signal
intensity to suggest this is a hematoma. There is moderate mass
effect on the posterior aspect of the thecal sac and findings
worrisome for abscess or CSF leak. There is also a large complex
fluid collection in the subcutaneous fat measuring 13.5 cm in
length.

Disc levels:

I do not see any findings suspicious for discitis or osteomyelitis
without contrast. There is fairly significant mass effect on the
thecal sac from L2-3 down to L4-5.
IMPRESSION: 1. Postoperative changes with L2-L5 fusion hardware.
2. Large complex fluid collection in the laminectomy bed from L2-L5.
This measures approximately 6.6 x 4.0 x 4.1 cm. There is moderate
mass effect on the posterior aspect of the thecal sac and findings
worrisome for abscess or CSF leak. Recommend surgical consultation.
3. Large complex subcutaneous fluid collection measuring 13.5 cm in
length.
4. No findings suspicious for discitis or osteomyelitis without
contrast.

## 2021-02-18 MED ORDER — SODIUM CHLORIDE 0.9% IV SOLUTION
250.0000 mL | Freq: Once | INTRAVENOUS | Status: AC
  Administered 2021-02-18: 250 mL via INTRAVENOUS
  Filled 2021-02-18: qty 250

## 2021-02-18 MED ORDER — ALPRAZOLAM 0.5 MG PO TABS
0.2500 mg | ORAL_TABLET | Freq: Two times a day (BID) | ORAL | 0 refills | Status: DC | PRN
Start: 2021-02-18 — End: 2021-02-24

## 2021-02-18 MED ORDER — DIPHENHYDRAMINE HCL 50 MG/ML IJ SOLN
25.0000 mg | Freq: Once | INTRAMUSCULAR | Status: AC
  Administered 2021-02-18: 25 mg via INTRAVENOUS
  Filled 2021-02-18: qty 1

## 2021-02-18 MED ORDER — ACETAMINOPHEN 325 MG PO TABS
650.0000 mg | ORAL_TABLET | Freq: Once | ORAL | Status: AC
  Administered 2021-02-18: 650 mg via ORAL
  Filled 2021-02-18: qty 2

## 2021-02-18 MED ORDER — SODIUM CHLORIDE 0.9% FLUSH
10.0000 mL | INTRAVENOUS | Status: DC | PRN
  Filled 2021-02-18: qty 10

## 2021-02-18 NOTE — Progress Notes (Signed)
[facility] [place] [facility] [place]. [facility] [address], [place], [place] [address] [phone] (phone) [phone] (fax)  Patient Care Team: [name], MD as PCP - General (Internal Medicine) Mellody Drown, MD as Consulting Physician (Gynecologic Oncology) Verlon Au, NP as Nurse Practitioner (Gynecologic Oncology) Benjaman Kindler, MD as Consulting Physician (Obstetrics and Gynecology) Hollice Espy, MD as Consulting Physician (Urology)   Name of the patient: Selena Rodgers  [id number]  [birth year]   Date of visit: 02/18/21  Diagnosis- grade 1 endometrial cancer  Chief complaint/ Reason for visit- Heavy vaginal bleeding  Heme/Onc history:  [name] is a 71 y.o. G55 female who is seen in consultation from Dr. Leafy Ro for endometrial cancer.   Seen for PMB in 11/22 by Dr Leafy Ro.  Prior endometrial ablation in 2000.    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   Took some Megace, but bleeding got worse, so stopped.   Bad back limits her mobility. Chronic pain.    She has gigantic lower abdominal hernia s/p prior unsuccessful repair with mesh due to incarcerated bowel.   MRI - 02/15/21 1. Signs of diffuse endometrial cancer within the uterus up to 90% of the myometrial thickness is involved. Tumor appears to extend to serosa in the RIGHT parametrium. Potential nodular area extending beyond the uterine fundus as well though on sagittal postcontrast images this cannot be connected to subjacent tumor within the uterus in appears separated from tumor by myometrium. 2. Indeterminate area in the RIGHT pelvis follows broad ligament and may represent an atrophic ovary. Given above findings PET scan may be helpful for complete staging. 3. Large hiatal hernia with 13 cm rectus separation above the symphysis  pubis.  Oncology History   No history exists.    Interval history- Patient is 71 year old female with new, above diagnosis of endometrial cancer. [facility] for complaints of heavy vaginal bleeding. Previously having spotting. Now reports passing multiple fist sized clots over past 24-48 hours. Feels weak and tired. Complains of anxiety with her diagnosis. Denies worsening pain.   Review of systems- ROS  General:  no complaints Skin: no complaints Eyes: no complaints HEENT: no complaints Breasts: no complaints Pulmonary: no complaints Cardiac: no complaints Gastrointestinal: no complaints Genitourinary/Sexual: no complaints Ob/Gyn: per HPI Musculoskeletal: no complaints Hematology: no complaints Neurologic/Psych: anxiety   Allergies  Allergen Reactions   Chlorpheniramine Anaphylaxis   Linseed Oil Anaphylaxis   Codeine Hives   Flax Seed Oil [Bio-Flax] Swelling    UNSPECIFIED    Latex Hives and Other (See Comments)    BLISTERS   Tape Other (See Comments)    BLISTERS   Tapentadol Other (See Comments)    BLISTERS   Rosemary Oil Swelling    Lip swelling   2,4-D Dimethylamine Rash    T-DAP   Aspirin Rash    Past Medical History:  Diagnosis Date   Anxiety    Arthritis    Chronic back pain    Endometrial cancer ([place]) 02/09/2021   GERD (gastroesophageal reflux disease)    Hypertension    Post-menopausal bleeding    Spinal stenosis    Umbilical hernia    Uterine cancer (H[facility])    Ventral hernia     Past Surgical History:  Procedure Laterality Date   ANTERIOR CERVICAL DECOMP/DISCECTOMY FUSION N/A 04/19/2015   Procedure: ANTERIOR CERVICAL DECOMPRESSION/DISCECTOMY FUSION  INTERBODY PROTHESIS PLATING BONEGRAFT CERVICAL THREE-FOUR ,CERVICAL FOUR-FIVE;  Surgeon: Newman Pies, MD;  Location: [place] [facility];  Service: Neurosurgery;  Laterality: N/A;   BREAST BIOPSY     CARPAL TUNNEL RELEASE     CHOLECYSTECTOMY     FOOT SURGERY Right     IRRIGATION AND DEBRIDEMENT ABDOMEN  01/10/2014   Dr. Marina Gravel   UMBILICAL HERNIA REPAIR     VENTRAL HERNIA REPAIR  12/26/2013   Dr. Pat Patrick   WOUND EXPLORATION N/A 07/14/2019   Procedure: LUMBAR EXPLORATION EVACUATION OF HEMATOMA;  Surgeon: Newman Pies, MD;  Location: [place];  Service: Neurosurgery;  Laterality: N/A;    Social History   Socioeconomic History   Marital status: Single    Spouse name: Not on file   Number of children: Not on file   Years of education: Not on file   Highest education level: Not on file  Occupational History   Not on file  Tobacco Use   Smoking status: Some Days   Smokeless tobacco: Never   Tobacco comments:    5 cigs daily  Vaping Use   Vaping Use: Never used  Substance and Sexual Activity   Alcohol use: No    Alcohol/week: 0.0 standard drinks   Drug use: No   Sexual activity: Not Currently    Birth control/protection: None  Other Topics Concern   Not on file  Social History Narrative   Not on file   Social Determinants of Health   Financial Resource Strain: Not on file  Food Insecurity: Not on file  Transportation Needs: Not on file  Physical Activity: Not on file  Stress: Not on file  Social Connections: Not on file  Intimate Partner Violence: Not on file    Family History  Problem Relation Age of Onset   Diabetes Maternal Grandmother    Diabetes Maternal Grandfather    Diabetes Paternal Grandmother    Diabetes Paternal Grandfather    Hypertension Mother      Current Outpatient Medications:    acetaminophen (TYLENOL) 500 MG tablet, Take 2 tablets (1,000 mg total) by mouth 3 (three) times daily., Disp: 30 tablet, Rfl: 0   citalopram (CELEXA) 20 MG tablet, Take 20 mg by mouth daily., Disp: , Rfl:    meloxicam (MOBIC) 15 MG tablet, Take 15 mg by mouth daily., Disp: , Rfl:    metFORMIN (GLUCOPHAGE) 500 MG tablet, Take 500 mg by mouth 2 (two) times daily., Disp: , Rfl:    omeprazole (PRILOSEC) 40 MG capsule, Take 40 mg by mouth daily.  , Disp: , Rfl:    vitamin B-12 (CYANOCOBALAMIN) 1000 MCG tablet, Take 1,000 mcg by mouth daily., Disp: , Rfl:    megestrol (MEGACE) 40 MG tablet, Take 40 mg by mouth daily. (Patient not taking: Reported on 02/18/2021), Disp: , Rfl:   Physical exam:  Vitals:   02/18/21 0900  BP: 94/65  Pulse: (!) 116  Resp: 20  Temp: 98.2 F (36.8 C)  SpO2: 100%  Weight: 282 lb (127.9 kg)   Physical Exam Constitutional:      Appearance: She is obese. She is not ill-appearing.  Cardiovascular:     Rate and Rhythm: Regular rhythm. Tachycardia present.  Pulmonary:     Effort: Pulmonary effort is normal. No respiratory distress.  Skin:    General: Skin is dry.     Coloration: Skin is pale.  Neurological:     Mental Status: She is alert and oriented to person, place, and time.  Psychiatric:  Mood and Affect: Affect is tearful.        Speech: Speech normal.        Behavior: Behavior is cooperative.     CMP Latest Ref Rng & Units 02/09/2021  Glucose 70 - 99 mg/dL 106(H)  BUN 8 - 23 mg/dL 15  Creatinine 0.44 - 1.00 mg/dL 0.66  Sodium 135 - 145 mmol/L 135  Potassium 3.5 - 5.1 mmol/L 4.1  Chloride 98 - 111 mmol/L 104  CO2 22 - 32 mmol/L 22  Calcium 8.9 - 10.3 mg/dL 9.3  Total Protein 6.5 - 8.1 g/dL 7.5  Total Bilirubin 0.3 - 1.2 mg/dL <0.1(L)  Alkaline Phos 38 - 126 U/L 58  AST 15 - 41 U/L 12(L)  ALT 0 - 44 U/L 9   CBC Latest Ref Rng & Units 02/18/2021  WBC 4.0 - 10.5 K/uL 12.7(H)  Hemoglobin 12.0 - 15.0 g/dL 8.4(L)  Hematocrit 36.0 - 46.0 % 26.3(L)  Platelets 150 - 400 K/uL 364    No images are attached to the encounter.  MR PELVIS W WO CONTRAST  Result Date: 02/16/2021 CLINICAL DATA:  Uterine/cervical cancer, assess myometrial invasion in the setting of endometrial cancer. History of endometrial ablation and prior hernia repair. EXAM: MRI PELVIS WITHOUT AND WITH CONTRAST TECHNIQUE: Multiplanar multisequence MR imaging of the pelvis was performed both before and after administration  of intravenous contrast. CONTRAST:  71mL MULTIHANCE GADOBENATE DIMEGLUMINE 529 MG/ML IV SOLN COMPARISON:  None FINDINGS: Urinary Tract: Urinary bladder is unremarkable. Under distension mildly limiting assessment. No distal ureteral dilation. Bowel: Gaseous distension of the rectum. No acute bowel finding to the extent evaluated on this pelvic MR centered about the uterus. Vascular/Lymphatic: Small bilateral pelvic sidewall lymph nodes largest up to 9 mm along the LEFT pelvic sidewall though with fatty hila of this, the largest lymph node in the pelvis. Mild prominence of bilateral groin lymph nodes. Vascular structures with limited assessment. Grossly patent and normal caliber. Reproductive: Lobular and in homogeneous appearance of the uterus with loss of normal zonal anatomy. Bland appearing T2 hypointensity with corresponding diffusion weighted abnormality extends across the myometrium. Irregular appearing endometrial contour with dilation of the endometrium in the uterine fundus. Abnormal signal crossing the myometrium, much greater than 50% and some places extending to the expected level of the serosa of the uterus Nodular area along the uterine fundus suggests local extra uterine extension (image 26/4) this can be better seen on image 30/3 where there is a discrete nodule that extends from the uterine fundus that matches the signal crossing myometrial boundaries. Also in the RIGHT parametrium there is no overlying myometrial signal (image 27/3) focal bulging of the uterine contour at this level is demonstrated. Greatest thickness of tumor approaching 2.4 cm. Greatest thickness of tumor and myometrium 2.8 cm. These are measured in different areas Ovaries are not clearly seen. There is an area in the region of the RIGHT adnexa that measures 8 mm (image 31/2) this small nodular area could represent the ovary though small peritoneal nodule given findings at the uterine fundus is also considered. 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 Other: No ascites. Very large ventral hernia with 13 cm rectus separation above the symphysis pubis. Not well assessed given field of view constraints. Musculoskeletal: Signs of lumbar spinal fusion with associated susceptibility artifact. IMPRESSION: 1. Signs of diffuse endometrial cancer within the uterus up to 90% of the myometrial thickness is involved. Tumor appears to extend to serosa in the  RIGHT parametrium. Potential nodular area extending beyond the uterine fundus as well though on sagittal postcontrast images this cannot be connected to subjacent tumor within the uterus in appears separated from tumor by myometrium. 2. Indeterminate area in the RIGHT pelvis follows broad ligament and may represent an atrophic ovary. Given above findings PET scan may be helpful for complete staging. 3. Large hiatal hernia with 13 cm rectus separation above the symphysis pubis. Electronically Signed   By: Zetta Bills M.D.   On: 02/16/2021 16:23    Assessment and plan- Patient is a 71 y.o. female diagnosed with    Abnormal vaginal and uterine bleeding- secondary to known endometrial malignancy. Discussed with Dr. Fransisca Connors who recommends having patient see radiation oncology to consider short course of radiation to improve bleeding. I spoke with Dr. Baruch Gouty who will see patient today, possible sim on Monday. Dr. Fransisca Connors has also discussed with Dr. Denman George at [facility]. They recommend evaluation at [facility] for possible joint procedure with plastic surgery for repair of known hernia and hysterectomy.  Symptomatic anemia- hemoglobin has dropped to 8.4 since earlier this week. Patient is symptomatic and continues to bleed. Anticipate that her hemoglobin will continue to drop over the weekend and therefore she will benefit from blood transfusion today. Consider hospital admission to monitor blood counts but she is hemodynamically stable and prefers outpatient if at all  possible. Plan for 1 unit of pRBCs today. She can continue megace though it does not appear to be helping. I will see her back on Monday for labs and possible blood transfusion.  Discussed expected benefits of transfusion and disclosed risks of transfusion, including but not limited to, transmission of infection or disease, acute or delayed hemolytic reactions, febrile reactions, allergic or anaphylactic reactions, transfusion associated lung injuries, circulatory overload, metabolic toxicity, and/or iron overload. Also, discussed alternatives to transfusion and use of any premedications. Patient verbalizes consent and wishes to proceed.  Anxiety- secondary to cancer diagnosis, treatment, acute condition. Currently on citalopram daily. Recommend adding xanax for short acting/acute anxiety control.   Disposition:  Blood today See [facility] today for new patient consult.  See Maciah Feeback Monday with labs (cbc, hold tube), possible blood transfusion vs iron- la  Visit Diagnosis 1. Abnormal vaginal bleeding   2. Endometrial cancer [facility])     Patient expressed understanding and was in agreement with this plan. She also understands that She can call clinic at any time with any questions, concerns, or complaints.   Thank you for allowing me to participate in the care of this very pleasant patient.   Beckey Rutter, DNP, AGNP-C [facility] [place]

## 2021-02-18 NOTE — Progress Notes (Signed)
Pt c/o weakness, fatigue and lightheadedness intermittently since biopsy date. Pt states that she has a rough time lately d/t passing blood clots, physical symptoms and having to recently have cat, of 16 years, euthanized.

## 2021-02-18 NOTE — Patient Instructions (Signed)

## 2021-02-18 NOTE — Consult Note (Signed)
NEW PATIENT EVALUATION  Name: Selena Rodgers  MRN: [id number]  Date:   02/18/2021     DOB: [birth year]   This 71 y.o. female patient presents to the clinic for initial evaluation of clinical stage III (T3 N0 M0).WELL DIFFERENTIATED  ENDOMETRIOID ADENOCARCINOMA WITH SQUAMOUS METAPLASIA (FIGO  GRADE I).  REFERRING PHYSICIAN: [name], MD  CHIEF COMPLAINT:  Chief Complaint  Patient presents with   Cancer    Initial consultation    DIAGNOSIS: The encounter diagnosis was Endometrial cancer [facility]).   PREVIOUS INVESTIGATIONS:  MRI scan reviewed Pathology report reviewed Clinical notes reviewed  HPI: Patient is a 71 year old female seen by her PMD Dr. Leafy Ro when she was having postmenopausal spotting.  She underwent endometrial biopsy at the end of January showing grade 1 adenocarcinoma with squamous metaplasia.  She was put on Megace although blade bleeding got worse.  She has an extremely large abdominal hernia status post unsuccessful repair with mesh due to incarcerated bowel.  She was seen by Dr. Fransisca Connors who ordered MRI scan showing signs of diffuse endometrial carcinoma within the uterus with up to 90% myometrial thickness involved tumor appears to extend to the serosa in the right parametrium there is also potential nodule area extending beyond the uterine fundus as well.  She has now progressed to more prominent bleeding and is requiring a transfusion today.  She is seen today for consideration of emergent radiation therapy to help stem the bleeding.  PLANNED TREATMENT REGIMEN: Whole pelvic radiation  PAST MEDICAL HISTORY:  has a past medical history of Anxiety, Arthritis, Chronic back pain, Endometrial cancer ([place]) (02/09/2021), GERD (gastroesophageal reflux disease), Hypertension, Post-menopausal bleeding, Spinal stenosis, Umbilical hernia, Uterine cancer ([place]), and Ventral hernia.    PAST SURGICAL HISTORY:  Past Surgical History:  Procedure Laterality Date    ANTERIOR CERVICAL DECOMP/DISCECTOMY FUSION N/A 04/19/2015   Procedure: ANTERIOR CERVICAL DECOMPRESSION/DISCECTOMY FUSION INTERBODY PROTHESIS PLATING BONEGRAFT CERVICAL THREE-FOUR ,CERVICAL FOUR-FIVE;  Surgeon: Newman Pies, MD;  Location: [place] [facility];  Service: Neurosurgery;  Laterality: N/A;   BREAST BIOPSY     CARPAL TUNNEL RELEASE     CHOLECYSTECTOMY     FOOT SURGERY Right    IRRIGATION AND DEBRIDEMENT ABDOMEN  01/10/2014   Dr. Marina Gravel   UMBILICAL HERNIA REPAIR     VENTRAL HERNIA REPAIR  12/26/2013   Dr. Pat Patrick   WOUND EXPLORATION N/A 07/14/2019   Procedure: LUMBAR EXPLORATION EVACUATION OF HEMATOMA;  Surgeon: Newman Pies, MD;  Location: [facility];  Service: Neurosurgery;  Laterality: N/A;    FAMILY HISTORY: family history includes Diabetes in her maternal grandfather, maternal grandmother, paternal grandfather, and paternal grandmother; Hypertension in her mother.  SOCIAL HISTORY:  reports that she has been smoking. She has never used smokeless tobacco. She reports that she does not drink alcohol and does not use drugs.  ALLERGIES: Chlorpheniramine; Linseed oil; Codeine; Flax seed oil [bio-flax]; Latex; Tape; Tapentadol; Rosemary oil; 2,4-d dimethylamine; and Aspirin  MEDICATIONS:  Current Outpatient Medications  Medication Sig Dispense Refill   acetaminophen (TYLENOL) 500 MG tablet Take 2 tablets (1,000 mg total) by mouth 3 (three) times daily. 30 tablet 0   ALPRAZolam (XANAX) 0.5 MG tablet Take 0.5-1 tablets (0.25-0.5 mg total) by mouth 2 (two) times daily as needed for anxiety. 30 tablet 0   citalopram (CELEXA) 20 MG tablet Take 20 mg by mouth daily.     megestrol (MEGACE) 40 MG tablet Take 40 mg by mouth daily. (Patient not taking: Reported on 02/18/2021)  meloxicam (MOBIC) 15 MG tablet Take 15 mg by mouth daily.     metFORMIN (GLUCOPHAGE) 500 MG tablet Take 500 mg by mouth 2 (two) times daily.     omeprazole (PRILOSEC) 40 MG capsule Take 40 mg by mouth daily.      vitamin B-12  (CYANOCOBALAMIN) 1000 MCG tablet Take 1,000 mcg by mouth daily.     No current facility-administered medications for this encounter.   Facility-Administered Medications Ordered in Other Encounters  Medication Dose Route Frequency Provider Last Rate Last Admin   0.9 %  sodium chloride infusion (Manually program via Guardrails IV Fluids)  250 mL Intravenous Once Verlon Au, NP       acetaminophen (TYLENOL) tablet 650 mg  650 mg Oral Once Verlon Au, NP       diphenhydrAMINE (BENADRYL) injection 25 mg  25 mg Intravenous Once Verlon Au, NP       sodium chloride flush (NS) 0.9 % injection 10 mL  10 mL Intracatheter PRN Verlon Au, NP        ECOG PERFORMANCE STATUS:  1 - Symptomatic but completely ambulatory  REVIEW OF SYSTEMS: Patient denies any weight loss, fatigue, weakness, fever, chills or night sweats. Patient denies any loss of vision, blurred vision. Patient denies any ringing  of the ears or hearing loss. No irregular heartbeat. Patient denies heart murmur or history of fainting. Patient denies any chest pain or pain radiating to her upper extremities. Patient denies any shortness of breath, difficulty breathing at night, cough or hemoptysis. Patient denies any swelling in the lower legs. Patient denies any nausea vomiting, vomiting of blood, or coffee ground material in the vomitus. Patient denies any stomach pain. Patient states has had normal bowel movements no significant constipation or diarrhea. Patient denies any dysuria, hematuria or significant nocturia. Patient denies any problems walking, swelling in the joints or loss of balance. Patient denies any skin changes, loss of hair or loss of weight. Patient denies any excessive worrying or anxiety or significant depression. Patient denies any problems with insomnia. Patient denies excessive thirst, polyuria, polydipsia. Patient denies any swollen glands, patient denies easy bruising or easy bleeding. Patient denies any  recent infections, allergies or URI. Patient "s visual fields have not changed significantly in recent time.   PHYSICAL EXAM: There were no vitals taken for this visit. Patient is extensively large abdominal hernia.  Well-developed well-nourished patient in NAD. HEENT reveals PERLA, EOMI, discs not visualized.  Oral cavity is clear. No oral mucosal lesions are identified. Neck is clear without evidence of cervical or supraclavicular adenopathy. Lungs are clear to A&P. Cardiac examination is essentially unremarkable with regular rate and rhythm without murmur rub or thrill. Abdomen is benign with no organomegaly or masses noted. Motor sensory and DTR levels are equal and symmetric in the upper and lower extremities. Cranial nerves II through XII are grossly intact. Proprioception is intact. No peripheral adenopathy or edema is identified. No motor or sensory levels are noted. Crude visual fields are within normal range.  LABORATORY DATA: Pathology report reviewed    RADIOLOGY RESULTS: MRI scan reviewed compatible with above-stated findings   IMPRESSION: At least stage III endometrial adenocarcinoma in 71 year old female with extremely large hernia with persistent bleeding.  PLAN: At this time I am going to start emergent radiation therapy.  We will plan on delivering 45 Gray over 5 weeks.  I have discussed the case with Dr. Fransisca Connors.  At some point she may be a surgical  candidate.  If not we will refer her to Dr. Christel Mormon at [facility] for consideration of intracavitary brachytherapy.  Risks and benefits of radiation including skin reaction fatigue possible increased lower urinary tract symptoms, diarrhea all were discussed in detail with the patient.  She seems to comprehend my treatment plan well.  I have set up simulation for Monday and will have her under treatment by the middle of next week.  I would like to take this opportunity to thank you for allowing me to participate in the care of your  patient.Noreene Filbert, MD

## 2021-02-19 LAB — BPAM RBC
Blood Product Expiration Date: 202303042359
ISSUE DATE / TIME: 202302171103
Unit Type and Rh: 6200

## 2021-02-19 LAB — TYPE AND SCREEN
ABO/RH(D): A POS
Antibody Screen: NEGATIVE
Unit division: 0

## 2021-02-21 ENCOUNTER — Other Ambulatory Visit: Payer: Self-pay | Admitting: Nurse Practitioner

## 2021-02-21 ENCOUNTER — Inpatient Hospital Stay (HOSPITAL_BASED_OUTPATIENT_CLINIC_OR_DEPARTMENT_OTHER): Payer: Medicare HMO | Admitting: Nurse Practitioner

## 2021-02-21 ENCOUNTER — Ambulatory Visit
Admission: RE | Admit: 2021-02-21 | Discharge: 2021-02-21 | Disposition: A | Discharge status: Home / Self Care | Payer: Medicare HMO | Source: Ambulatory Visit | Attending: Radiation Oncology | Admitting: Radiation Oncology

## 2021-02-21 ENCOUNTER — Other Ambulatory Visit: Payer: Self-pay

## 2021-02-21 ENCOUNTER — Inpatient Hospital Stay: Payer: Medicare HMO

## 2021-02-21 ENCOUNTER — Encounter: Payer: Self-pay | Admitting: Nurse Practitioner

## 2021-02-21 VITALS — BP 101/60 | HR 88 | Temp 98.2°F | Resp 18 | Wt 282.0 lb

## 2021-02-21 DIAGNOSIS — N939 Abnormal uterine and vaginal bleeding, unspecified: Secondary | ICD-10-CM

## 2021-02-21 DIAGNOSIS — D649 Anemia, unspecified: Secondary | ICD-10-CM | POA: Diagnosis not present

## 2021-02-21 DIAGNOSIS — C541 Malignant neoplasm of endometrium: Secondary | ICD-10-CM

## 2021-02-21 DIAGNOSIS — Z510 Encounter for antineoplastic radiation therapy: Secondary | ICD-10-CM | POA: Diagnosis present

## 2021-02-21 LAB — PREPARE RBC (CROSSMATCH)

## 2021-02-21 LAB — CBC WITH DIFFERENTIAL/PLATELET
Abs Immature Granulocytes: 0.04 10*3/uL (ref 0.00–0.07)
Basophils Absolute: 0.1 10*3/uL (ref 0.0–0.1)
Basophils Relative: 1 %
Eosinophils Absolute: 0.3 10*3/uL (ref 0.0–0.5)
Eosinophils Relative: 2 %
HCT: 25.7 % — ABNORMAL LOW (ref 36.0–46.0)
Hemoglobin: 8.2 g/dL — ABNORMAL LOW (ref 12.0–15.0)
Immature Granulocytes: 0 %
Lymphocytes Relative: 16 %
Lymphs Abs: 1.9 10*3/uL (ref 0.7–4.0)
MCH: 27.6 pg (ref 26.0–34.0)
MCHC: 31.9 g/dL (ref 30.0–36.0)
MCV: 86.5 fL (ref 80.0–100.0)
Monocytes Absolute: 0.6 10*3/uL (ref 0.1–1.0)
Monocytes Relative: 5 %
Neutro Abs: 9.1 10*3/uL — ABNORMAL HIGH (ref 1.7–7.7)
Neutrophils Relative %: 76 %
Platelets: 312 10*3/uL (ref 150–400)
RBC: 2.97 MIL/uL — ABNORMAL LOW (ref 3.87–5.11)
RDW: 15.4 % (ref 11.5–15.5)
WBC: 12 10*3/uL — ABNORMAL HIGH (ref 4.0–10.5)
nRBC: 0 % (ref 0.0–0.2)

## 2021-02-21 LAB — SAMPLE TO BLOOD BANK

## 2021-02-21 MED ORDER — SODIUM CHLORIDE 0.9% IV SOLUTION
250.0000 mL | Freq: Once | INTRAVENOUS | Status: AC
  Administered 2021-02-21: 250 mL via INTRAVENOUS
  Filled 2021-02-21: qty 250

## 2021-02-21 NOTE — Progress Notes (Signed)
Symptom Management [place] [facility] [place]. [facility] [address] [place], [place] [address] [phone] (phone) [phone] (fax)  Patient Care Team: [name], MD as PCP - General (Internal Medicine) Mellody Drown, MD as Consulting Physician (Gynecologic Oncology) Verlon Au, NP as Nurse Practitioner (Gynecologic Oncology) Benjaman Kindler, MD as Consulting Physician (Obstetrics and Gynecology) Hollice Espy, MD as Consulting Physician (Urology)   Name of the patient: Selena Rodgers  [id number]  09-19-1950   Date of visit: 02/21/21  Diagnosis- grade 1 endometrial cancer  Chief complaint/ Reason for visit- Heavy vaginal bleeding & Anemia  Heme/Onc history:  Selena Rodgers is a 71 y.o. G52 female who is seen in consultation from Dr. Leafy Ro for endometrial cancer.   Seen for PMB in 11/22 by Dr Leafy Ro.  Prior endometrial ablation in 2000.    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   Took some Megace, but bleeding got worse, so stopped.   Bad back limits her mobility. Chronic pain.    She has gigantic lower abdominal hernia s/p prior unsuccessful repair with mesh due to incarcerated bowel.   MRI - 02/15/21 1. Signs of diffuse endometrial cancer within the uterus up to 90% of the myometrial thickness is involved. Tumor appears to extend to serosa in the RIGHT parametrium. Potential nodular area extending beyond the uterine fundus as well though on sagittal postcontrast images this cannot be connected to subjacent tumor within the uterus in appears separated from tumor by myometrium. 2. Indeterminate area in the RIGHT pelvis follows broad ligament and may represent an atrophic ovary. Given above findings PET scan may be helpful for complete staging. 3. Large hiatal hernia with 13 cm rectus separation above the symphysis  pubis.  Oncology History   No history exists.    Interval history-patient is 71 year old female with above history of endometrial cancer.  She returns to clinic today for labs and possible blood transfusion.  She received 1 unit of PRBC on Friday.  Over the weekend, continue to pass large blood clots.  Anxiety is improved somewhat with Xanax.  Reports improved sleep.  She restarted Megace though not sure if it's helping. Denies pain. Continues to express desire to have surgery at [facility] for malignancy and hernia if possible.   Review of systems- Review of Systems  Constitutional:  Positive for malaise/fatigue. Negative for chills, fever and weight loss.  HENT:  Negative for hearing loss, nosebleeds, sore throat and tinnitus.   Eyes:  Negative for blurred vision and double vision.  Respiratory:  Negative for cough, hemoptysis, shortness of breath and wheezing.   Cardiovascular:  Negative for chest pain, palpitations and leg swelling.  Gastrointestinal:  Negative for abdominal pain, blood in stool, constipation, diarrhea, melena, nausea and vomiting.  Genitourinary:  Negative for dysuria and urgency.  Musculoskeletal:  Negative for back pain, falls, joint pain and myalgias.  Skin:  Negative for itching and rash.  Neurological:  Negative for dizziness, tingling, sensory change, loss of consciousness, weakness and headaches.  Endo/Heme/Allergies:  Negative for environmental allergies. Does not bruise/bleed easily.  Psychiatric/Behavioral:  Negative for depression. The patient is nervous/anxious. The patient does not have insomnia.       Allergies  Allergen Reactions   Chlorpheniramine Anaphylaxis   Linseed Oil Anaphylaxis   Codeine Hives   Flax Seed Oil [Bio-Flax] Swelling    UNSPECIFIED    Latex Hives and Other (  See Comments)    BLISTERS   Tape Other (See Comments)    BLISTERS   Tapentadol Other (See Comments)    BLISTERS   Rosemary Oil Swelling    Lip swelling   2,4-D Dimethylamine  Rash    T-DAP   Aspirin Rash    Past Medical History:  Diagnosis Date   Anxiety    Arthritis    Chronic back pain    Endometrial cancer ([place] 02/09/2021   GERD (gastroesophageal reflux disease)    Hypertension    Post-menopausal bleeding    Spinal stenosis    Umbilical hernia    Uterine cancer ([place])    Ventral hernia     Past Surgical History:  Procedure Laterality Date   ANTERIOR CERVICAL DECOMP/DISCECTOMY FUSION N/A 04/19/2015   Procedure: ANTERIOR CERVICAL DECOMPRESSION/DISCECTOMY FUSION INTERBODY PROTHESIS PLATING BONEGRAFT CERVICAL THREE-FOUR ,CERVICAL FOUR-FIVE;  Surgeon: Newman Pies, MD;  Location: [facility];  Service: Neurosurgery;  Laterality: N/A;   BREAST BIOPSY     CARPAL TUNNEL RELEASE     CHOLECYSTECTOMY     FOOT SURGERY Right    IRRIGATION AND DEBRIDEMENT ABDOMEN  01/10/2014   Dr. Marina Gravel   UMBILICAL HERNIA REPAIR     VENTRAL HERNIA REPAIR  12/26/2013   Dr. Pat Patrick   WOUND EXPLORATION N/A 07/14/2019   Procedure: LUMBAR EXPLORATION EVACUATION OF HEMATOMA;  Surgeon: Newman Pies, MD;  Location: [facility];  Service: Neurosurgery;  Laterality: N/A;    Social History   Socioeconomic History   Marital status: Single    Spouse name: Not on file   Number of children: Not on file   Years of education: Not on file   Highest education level: Not on file  Occupational History   Not on file  Tobacco Use   Smoking status: Some Days   Smokeless tobacco: Never   Tobacco comments:    5 cigs daily  Vaping Use   Vaping Use: Never used  Substance and Sexual Activity   Alcohol use: No    Alcohol/week: 0.0 standard drinks   Drug use: No   Sexual activity: Not Currently    Birth control/protection: None  Other Topics Concern   Not on file  Social History Narrative   Not on file   Social Determinants of Health   Financial Resource Strain: Not on file  Food Insecurity: Not on file  Transportation Needs: Not on file  Physical Activity: Not on file  Stress: Not  on file  Social Connections: Not on file  Intimate Partner Violence: Not on file    Family History  Problem Relation Age of Onset   Diabetes Maternal Grandmother    Diabetes Maternal Grandfather    Diabetes Paternal Grandmother    Diabetes Paternal Grandfather    Hypertension Mother      Current Outpatient Medications:    acetaminophen (TYLENOL) 500 MG tablet, Take 2 tablets (1,000 mg total) by mouth 3 (three) times daily., Disp: 30 tablet, Rfl: 0   ALPRAZolam (XANAX) 0.5 MG tablet, Take 0.5-1 tablets (0.25-0.5 mg total) by mouth 2 (two) times daily as needed for anxiety., Disp: 30 tablet, Rfl: 0   citalopram (CELEXA) 20 MG tablet, Take 20 mg by mouth daily., Disp: , Rfl:    megestrol (MEGACE) 40 MG tablet, Take 40 mg by mouth daily., Disp: , Rfl:    meloxicam (MOBIC) 15 MG tablet, Take 15 mg by mouth daily., Disp: , Rfl:    metFORMIN (GLUCOPHAGE) 500 MG tablet, Take 500 mg by  mouth 2 (two) times daily., Disp: , Rfl:    omeprazole (PRILOSEC) 40 MG capsule, Take 40 mg by mouth daily. , Disp: , Rfl:    vitamin B-12 (CYANOCOBALAMIN) 1000 MCG tablet, Take 1,000 mcg by mouth daily., Disp: , Rfl:   Physical exam:  Vitals:   02/21/21 0831  BP: 101/60  Pulse: 88  Resp: 18  Temp: 98.2 F (36.8 C)  SpO2: 99%  Weight: 282 lb (127.9 kg)   Physical Exam Constitutional:      Appearance: She is not ill-appearing.  Cardiovascular:     Rate and Rhythm: Regular rhythm. Tachycardia present.  Pulmonary:     Effort: Pulmonary effort is normal. No respiratory distress.  Abdominal:     Hernia: A hernia is present.  Skin:    General: Skin is dry.     Coloration: Skin is not pale.  Neurological:     Mental Status: She is alert and oriented to person, place, and time.  Psychiatric:        Attention and Perception: Attention normal.        Mood and Affect: Mood is anxious.        Speech: Speech normal.        Behavior: Behavior is cooperative.     CMP Latest Ref Rng & Units 02/09/2021   Glucose 70 - 99 mg/dL 106(H)  BUN 8 - 23 mg/dL 15  Creatinine 0.44 - 1.00 mg/dL 0.66  Sodium 135 - 145 mmol/L 135  Potassium 3.5 - 5.1 mmol/L 4.1  Chloride 98 - 111 mmol/L 104  CO2 22 - 32 mmol/L 22  Calcium 8.9 - 10.3 mg/dL 9.3  Total Protein 6.5 - 8.1 g/dL 7.5  Total Bilirubin 0.3 - 1.2 mg/dL <0.1(L)  Alkaline Phos 38 - 126 U/L 58  AST 15 - 41 U/L 12(L)  ALT 0 - 44 U/L 9   CBC Latest Ref Rng & Units 02/21/2021  WBC 4.0 - 10.5 K/uL 12.0(H)  Hemoglobin 12.0 - 15.0 g/dL 8.2(L)  Hematocrit 36.0 - 46.0 % 25.7(L)  Platelets 150 - 400 K/uL 312    No images are attached to the encounter.  MR PELVIS W WO CONTRAST  Result Date: 02/16/2021 CLINICAL DATA:  Uterine/cervical cancer, assess myometrial invasion in the setting of endometrial cancer. History of endometrial ablation and prior hernia repair. EXAM: MRI PELVIS WITHOUT AND WITH CONTRAST TECHNIQUE: Multiplanar multisequence MR imaging of the pelvis was performed both before and after administration of intravenous contrast. CONTRAST:  70mL MULTIHANCE GADOBENATE DIMEGLUMINE 529 MG/ML IV SOLN COMPARISON:  None FINDINGS: Urinary Tract: Urinary bladder is unremarkable. Under distension mildly limiting assessment. No distal ureteral dilation. Bowel: Gaseous distension of the rectum. No acute bowel finding to the extent evaluated on this pelvic MR centered about the uterus. Vascular/Lymphatic: Small bilateral pelvic sidewall lymph nodes largest up to 9 mm along the LEFT pelvic sidewall though with fatty hila of this, the largest lymph node in the pelvis. Mild prominence of bilateral groin lymph nodes. Vascular structures with limited assessment. Grossly patent and normal caliber. Reproductive: Lobular and in homogeneous appearance of the uterus with loss of normal zonal anatomy. Bland appearing T2 hypointensity with corresponding diffusion weighted abnormality extends across the myometrium. Irregular appearing endometrial contour with dilation of the  endometrium in the uterine fundus. Abnormal signal crossing the myometrium, much greater than 50% and some places extending to the expected level of the serosa of the uterus Nodular area along the uterine fundus suggests local extra uterine extension (image  26/4) this can be better seen on image 30/3 where there is a discrete nodule that extends from the uterine fundus that matches the signal crossing myometrial boundaries. Also in the RIGHT parametrium there is no overlying myometrial signal (image 27/3) focal bulging of the uterine contour at this level is demonstrated. Greatest thickness of tumor approaching 2.4 cm. Greatest thickness of tumor and myometrium 2.8 cm. These are measured in different areas Ovaries are not clearly seen. There is an area in the region of the RIGHT adnexa that measures 8 mm (image 31/2) this small nodular area could represent the ovary though small peritoneal nodule given findings at the uterine fundus is also considered. Small uterine leiomyoma likely along the LEFT lateral uterus 16 mm. Other: No ascites. Very large ventral hernia with 13 cm rectus separation above the symphysis pubis. Not well assessed given field of view constraints. Musculoskeletal: Signs of lumbar spinal fusion with associated susceptibility artifact. IMPRESSION: 1. Signs of diffuse endometrial cancer within the uterus up to 90% of the myometrial thickness is involved. Tumor appears to extend to serosa in the RIGHT parametrium. Potential nodular area extending beyond the uterine fundus as well though on sagittal postcontrast images this cannot be connected to subjacent tumor within the uterus in appears separated from tumor by myometrium. 2. Indeterminate area in the RIGHT pelvis follows broad ligament and may represent an atrophic ovary. Given above findings PET scan may be helpful for complete staging. 3. Large hiatal hernia with 13 cm rectus separation above the symphysis pubis. Electronically Signed   By:  Zetta Bills M.D.   On: 02/16/2021 16:23    Assessment and plan- Patient is a 71 y.o. female diagnosed with    Abnormal vaginal and uterine bleeding- secondary to known endometrial malignancy. She will have radiation simulation today for palliative management of bleeding. Discussed with Dr. Baruch Gouty who plans for her to start radiation later this week.  Symptomatic anemia- hemoglobin has dropped to 8.2. She is currently s/p 1 unit of pRBCs on 2/17. She continues to have heavy vaginal bleeding. Plan for 1 unit of pRBCs today. Will recheck her counts for consideration of additional blood later this week.   Endometrial Cancer- patient was discussed at [facility] today and her MRI was reviewed. Recommendation was for PET scan. Will order today. Reviewed recommendation with patient. She will see Dr. Fransisca Connors back after she completes radiation for further treatment planning. Currently, he estimates around the end of march so we will plan to see her with gyn-onc first of April.  Hernia- large abdominal hernia. Possible plan for joint surgical intervention at time of patient's possible hysterectomy.  Anxiety associated with cancer diagnosis- continue citalopram. Continue xanax for short acting anxiety control   Disposition:  Blood today Pet scan asap Prtc on 2/23 for labs, see me, possible blood  Visit Diagnosis No diagnosis found.  Patient expressed understanding and was in agreement with this plan. She also understands that She can call clinic at any time with any questions, concerns, or complaints.   Thank you for allowing me to participate in the care of this very pleasant patient.   Beckey Rutter, DNP, AGNP-C [place] at [place]

## 2021-02-21 NOTE — Progress Notes (Signed)
Pt states she is a little agitated this morning. She is tired of not knowing when she is going to pass blood clots. Reports "feeling better" after blood on Friday but passed a "large clot" that afternoon. Saturday and Sunday was minimal spotting and this morning she passed a small clot.

## 2021-02-21 NOTE — Progress Notes (Signed)
Imaging reviewed at [facility]. Dr. Fransisca Connors recommends PET scan, MMR/MSI testing. Patient to start pelvic radiation to control bleeding. Dr. Fransisca Connors would like to see patient back after she completes radiation.

## 2021-02-21 NOTE — Patient Instructions (Signed)
[facility] AT [place]  Discharge Instructions: Thank you for choosing [facility] to provide your oncology and hematology care.  If you have a lab appointment with the [facility], please go directly to the [facility] and check in at the registration area.  Wear comfortable clothing and clothing appropriate for easy access to any Portacath or PICC line.   We strive to give you quality time with your provider. You may need to reschedule your appointment if you arrive late (15 or more minutes).  Arriving late affects you and other patients whose appointments are after yours.  Also, if you miss three or more appointments without notifying the office, you may be dismissed from the clinic at the providers discretion.      For prescription refill requests, have your pharmacy contact our office and allow 72 hours for refills to be completed.    Today you received the following chemotherapy and/or immunotherapy agents : blood transfusion     To help prevent nausea and vomiting after your treatment, we encourage you to take your nausea medication as directed.  BELOW ARE SYMPTOMS THAT SHOULD BE REPORTED IMMEDIATELY: *FEVER GREATER THAN 100.4 F (38 C) OR HIGHER *CHILLS OR SWEATING *NAUSEA AND VOMITING THAT IS NOT CONTROLLED WITH YOUR NAUSEA MEDICATION *UNUSUAL SHORTNESS OF BREATH *UNUSUAL BRUISING OR BLEEDING *URINARY PROBLEMS (pain or burning when urinating, or frequent urination) *BOWEL PROBLEMS (unusual diarrhea, constipation, pain near the anus) TENDERNESS IN MOUTH AND THROAT WITH OR WITHOUT PRESENCE OF ULCERS (sore throat, sores in mouth, or a toothache) UNUSUAL RASH, SWELLING OR PAIN  UNUSUAL VAGINAL DISCHARGE OR ITCHING   Items with * indicate a potential emergency and should be followed up as soon as possible or go to the Emergency Department if any problems should occur.  Please show the CHEMOTHERAPY ALERT CARD or IMMUNOTHERAPY ALERT CARD at  check-in to the Emergency Department and triage nurse.  Should you have questions after your visit or need to cancel or reschedule your appointment, please contact [facility] [place] AT [place]  [phone] and follow the prompts.  Office hours are 8:00 a.m. to 4:30 p.m. Monday - Friday. Please note that voicemails left after 4:00 p.m. may not be returned until the following business day.  We are closed weekends and major holidays. You have access to a nurse at all times for urgent questions. Please call the main number to the clinic [phone] and follow the prompts.  For any non-urgent questions, you may also contact your provider using MyChart. We now offer e-Visits for anyone 97 and older to request care online for non-urgent symptoms. For details visit [email or web address].   Also download the MyChart app! Go to the app store, search "MyChart", open the app, select [facility], and log in with your MyChart username and password.  Due to Covid, a mask is required upon entering the hospital/clinic. If you do not have a mask, one will be given to you upon arrival. For doctor visits, patients may have 1 support person aged 88 or older with them. For treatment visits, patients cannot have anyone with them due to current Covid guidelines and our immunocompromised population.

## 2021-02-22 ENCOUNTER — Telehealth: Payer: Self-pay

## 2021-02-22 LAB — BPAM RBC
Blood Product Expiration Date: 202303112359
ISSUE DATE / TIME: 202302201157
Unit Type and Rh: 6200

## 2021-02-22 LAB — TYPE AND SCREEN
ABO/RH(D): A POS
Antibody Screen: NEGATIVE
Unit division: 0

## 2021-02-22 NOTE — Telephone Encounter (Signed)
Request sent to [facility] for MMR and MSI testing on endometrial biopsy collected 01/25/2021

## 2021-02-23 ENCOUNTER — Encounter (HOSPITAL_COMMUNITY)
Admission: RE | Admit: 2021-02-23 | Discharge: 2021-02-23 | Disposition: A | Discharge status: Home / Self Care | Payer: Medicare HMO | Source: Ambulatory Visit | Attending: Nurse Practitioner | Admitting: Nurse Practitioner

## 2021-02-23 ENCOUNTER — Other Ambulatory Visit: Payer: Self-pay

## 2021-02-23 DIAGNOSIS — Z510 Encounter for antineoplastic radiation therapy: Secondary | ICD-10-CM | POA: Diagnosis not present

## 2021-02-23 DIAGNOSIS — C541 Malignant neoplasm of endometrium: Secondary | ICD-10-CM | POA: Diagnosis not present

## 2021-02-23 LAB — GLUCOSE, CAPILLARY: Glucose-Capillary: 126 mg/dL — ABNORMAL HIGH (ref 70–99)

## 2021-02-23 MED ORDER — FLUDEOXYGLUCOSE F - 18 (FDG) INJECTION
14.1000 | Freq: Once | INTRAVENOUS | Status: AC
  Administered 2021-02-23: 13.9 via INTRAVENOUS

## 2021-02-24 ENCOUNTER — Other Ambulatory Visit: Payer: Self-pay | Admitting: *Deleted

## 2021-02-24 ENCOUNTER — Ambulatory Visit
Admission: RE | Admit: 2021-02-24 | Discharge: 2021-02-24 | Disposition: A | Discharge status: Home / Self Care | Payer: Medicare HMO | Source: Ambulatory Visit | Attending: Radiation Oncology | Admitting: Radiation Oncology

## 2021-02-24 ENCOUNTER — Inpatient Hospital Stay: Payer: Medicare HMO

## 2021-02-24 ENCOUNTER — Inpatient Hospital Stay (HOSPITAL_BASED_OUTPATIENT_CLINIC_OR_DEPARTMENT_OTHER): Payer: Medicare HMO | Admitting: Nurse Practitioner

## 2021-02-24 ENCOUNTER — Other Ambulatory Visit: Payer: Self-pay

## 2021-02-24 ENCOUNTER — Encounter: Payer: Self-pay | Admitting: Nurse Practitioner

## 2021-02-24 VITALS — BP 141/72 | HR 91 | Temp 98.7°F | Resp 18 | Ht 67.0 in | Wt 283.0 lb

## 2021-02-24 VITALS — BP 136/66 | HR 76 | Temp 98.0°F | Resp 18

## 2021-02-24 DIAGNOSIS — F411 Generalized anxiety disorder: Secondary | ICD-10-CM | POA: Diagnosis not present

## 2021-02-24 DIAGNOSIS — N950 Postmenopausal bleeding: Secondary | ICD-10-CM

## 2021-02-24 DIAGNOSIS — D649 Anemia, unspecified: Secondary | ICD-10-CM

## 2021-02-24 DIAGNOSIS — N939 Abnormal uterine and vaginal bleeding, unspecified: Secondary | ICD-10-CM

## 2021-02-24 DIAGNOSIS — C541 Malignant neoplasm of endometrium: Secondary | ICD-10-CM

## 2021-02-24 DIAGNOSIS — C801 Malignant (primary) neoplasm, unspecified: Secondary | ICD-10-CM

## 2021-02-24 DIAGNOSIS — Z510 Encounter for antineoplastic radiation therapy: Secondary | ICD-10-CM | POA: Diagnosis not present

## 2021-02-24 LAB — CBC WITH DIFFERENTIAL/PLATELET
Abs Immature Granulocytes: 0.04 10*3/uL (ref 0.00–0.07)
Basophils Absolute: 0.1 10*3/uL (ref 0.0–0.1)
Basophils Relative: 1 %
Eosinophils Absolute: 0.2 10*3/uL (ref 0.0–0.5)
Eosinophils Relative: 2 %
HCT: 27.9 % — ABNORMAL LOW (ref 36.0–46.0)
Hemoglobin: 8.9 g/dL — ABNORMAL LOW (ref 12.0–15.0)
Immature Granulocytes: 0 %
Lymphocytes Relative: 18 %
Lymphs Abs: 1.9 10*3/uL (ref 0.7–4.0)
MCH: 27.4 pg (ref 26.0–34.0)
MCHC: 31.9 g/dL (ref 30.0–36.0)
MCV: 85.8 fL (ref 80.0–100.0)
Monocytes Absolute: 0.5 10*3/uL (ref 0.1–1.0)
Monocytes Relative: 5 %
Neutro Abs: 8.2 10*3/uL — ABNORMAL HIGH (ref 1.7–7.7)
Neutrophils Relative %: 74 %
Platelets: 336 10*3/uL (ref 150–400)
RBC: 3.25 MIL/uL — ABNORMAL LOW (ref 3.87–5.11)
RDW: 15 % (ref 11.5–15.5)
WBC: 11 10*3/uL — ABNORMAL HIGH (ref 4.0–10.5)
nRBC: 0 % (ref 0.0–0.2)

## 2021-02-24 MED ORDER — SODIUM CHLORIDE 0.9 % IV SOLN: 200.0000 mg | Freq: Once | INTRAVENOUS | Status: DC

## 2021-02-24 MED ORDER — ALPRAZOLAM 0.5 MG PO TABS: 0.2500 mg | ORAL_TABLET | Freq: Two times a day (BID) | ORAL | 0 refills | Status: DC | PRN

## 2021-02-24 MED ORDER — IRON SUCROSE 20 MG/ML IV SOLN
200.0000 mg | Freq: Once | INTRAVENOUS | Status: AC
  Administered 2021-02-24: 200 mg via INTRAVENOUS
  Filled 2021-02-24: qty 10

## 2021-02-24 MED ORDER — SODIUM CHLORIDE 0.9 % IV SOLN
Freq: Once | INTRAVENOUS | Status: AC
  Filled 2021-02-24: qty 250

## 2021-02-24 NOTE — Patient Instructions (Signed)

## 2021-02-24 NOTE — Progress Notes (Signed)
Symptom Management [place] [facility] [address] [place], [place] [address] [phone] (phone) [phone] (fax)  Patient Care Team: [name], MD as PCP - General (Internal Medicine) Mellody Drown, MD as Consulting Physician (Gynecologic Oncology) Verlon Au, NP as Nurse Practitioner (Gynecologic Oncology) Benjaman Kindler, MD as Consulting Physician (Obstetrics and Gynecology) Hollice Espy, MD as Consulting Physician (Urology)   Name of the patient: Selena Rodgers  [id number]  1950-04-08   Date of visit: 02/24/21  Diagnosis- grade 1 endometrial cancer  Chief complaint/ Reason for visit- Heavy vaginal bleeding & Anemia  Heme/Onc history:  Selena Rodgers is a 71 y.o. G57 female who is seen in consultation from Dr. Leafy Ro for endometrial cancer.   Seen for PMB in 11/22 by Dr Leafy Ro.  Prior endometrial ablation in 2000.    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   Took some Megace, but bleeding got worse, so stopped.   Bad back limits her mobility. Chronic pain.    She has gigantic lower abdominal hernia s/p prior unsuccessful repair with mesh due to incarcerated bowel.   MRI - 02/15/21 1. Signs of diffuse endometrial cancer within the uterus up to 90% of the myometrial thickness is involved. Tumor appears to extend to serosa in the RIGHT parametrium. Potential nodular area extending beyond the uterine fundus as well though on sagittal postcontrast images this cannot be connected to subjacent tumor within the uterus in appears separated from tumor by myometrium. 2. Indeterminate area in the RIGHT pelvis follows broad ligament and may represent an atrophic ovary. Given above findings PET scan may be helpful for complete staging. 3. Large hiatal hernia with 13 cm rectus separation above the symphysis  pubis.  Oncology History   No history exists.    Interval history-patient is 71 year old female with above history of endometrial cancer.  She returns to clinic today for labs and possible blood transfusion.  She received 1 unit of PRBC on Friday and again on Monday. Bleeding has lessened. She is scheduled to start radiation today. Continues to be highly anxious. Requests refill of xanax which she has been taking at night.   Review of systems- Review of Systems  Constitutional:  Positive for malaise/fatigue. Negative for chills, fever and weight loss.  HENT:  Negative for hearing loss, nosebleeds, sore throat and tinnitus.   Eyes:  Negative for blurred vision and double vision.  Respiratory:  Negative for cough, hemoptysis, shortness of breath and wheezing.   Cardiovascular:  Negative for chest pain, palpitations and leg swelling.  Gastrointestinal:  Negative for abdominal pain, blood in stool, constipation, diarrhea, melena, nausea and vomiting.  Genitourinary:  Negative for dysuria and urgency.  Musculoskeletal:  Negative for back pain, falls, joint pain and myalgias.  Skin:  Negative for itching and rash.  Neurological:  Negative for dizziness, tingling, sensory change, loss of consciousness, weakness and headaches.  Endo/Heme/Allergies:  Negative for environmental allergies. Does not bruise/bleed easily.  Psychiatric/Behavioral:  Negative for depression. The patient is nervous/anxious. The patient does not have insomnia.       Allergies  Allergen Reactions   Chlorpheniramine Anaphylaxis   Linseed Oil Anaphylaxis   Codeine Hives   Flax Seed Oil [Bio-Flax] Swelling    UNSPECIFIED    Latex Hives and Other (See Comments)    BLISTERS   Tape Other (See Comments)    BLISTERS  Tapentadol Other (See Comments)    BLISTERS   Rosemary Oil Swelling    Lip swelling   2,4-D Dimethylamine Rash    T-DAP   Aspirin Rash    Past Medical History:  Diagnosis Date   Anxiety    Arthritis     Chronic back pain    Endometrial cancer ([place]) 02/09/2021   GERD (gastroesophageal reflux disease)    Hypertension    Post-menopausal bleeding    Spinal stenosis    Umbilical hernia    Uterine cancer ([place])    Ventral hernia     Past Surgical History:  Procedure Laterality Date   ANTERIOR CERVICAL DECOMP/DISCECTOMY FUSION N/A 04/19/2015   Procedure: ANTERIOR CERVICAL DECOMPRESSION/DISCECTOMY FUSION INTERBODY PROTHESIS PLATING BONEGRAFT CERVICAL THREE-FOUR ,CERVICAL FOUR-FIVE;  Surgeon: Newman Pies, MD;  Location: [facility];  Service: Neurosurgery;  Laterality: N/A;   BREAST BIOPSY     CARPAL TUNNEL RELEASE     CHOLECYSTECTOMY     FOOT SURGERY Right    IRRIGATION AND DEBRIDEMENT ABDOMEN  01/10/2014   Dr. Marina Gravel   UMBILICAL HERNIA REPAIR     VENTRAL HERNIA REPAIR  12/26/2013   Dr. Pat Patrick   WOUND EXPLORATION N/A 07/14/2019   Procedure: LUMBAR EXPLORATION EVACUATION OF HEMATOMA;  Surgeon: Newman Pies, MD;  Location: [facility];  Service: Neurosurgery;  Laterality: N/A;    Social History   Socioeconomic History   Marital status: Single    Spouse name: Not on file   Number of children: Not on file   Years of education: Not on file   Highest education level: Not on file  Occupational History   Not on file  Tobacco Use   Smoking status: Some Days   Smokeless tobacco: Never   Tobacco comments:    5 cigs daily  Vaping Use   Vaping Use: Never used  Substance and Sexual Activity   Alcohol use: No    Alcohol/week: 0.0 standard drinks   Drug use: No   Sexual activity: Not Currently    Birth control/protection: None  Other Topics Concern   Not on file  Social History Narrative   Not on file   Social Determinants of Health   Financial Resource Strain: Not on file  Food Insecurity: Not on file  Transportation Needs: Not on file  Physical Activity: Not on file  Stress: Not on file  Social Connections: Not on file  Intimate Partner Violence: Not on file    Family  History  Problem Relation Age of Onset   Diabetes Maternal Grandmother    Diabetes Maternal Grandfather    Diabetes Paternal Grandmother    Diabetes Paternal Grandfather    Hypertension Mother      Current Outpatient Medications:    acetaminophen (TYLENOL) 500 MG tablet, Take 2 tablets (1,000 mg total) by mouth 3 (three) times daily., Disp: 30 tablet, Rfl: 0   ALPRAZolam (XANAX) 0.5 MG tablet, Take 0.5-1 tablets (0.25-0.5 mg total) by mouth 2 (two) times daily as needed for anxiety., Disp: 30 tablet, Rfl: 0   citalopram (CELEXA) 20 MG tablet, Take 20 mg by mouth daily., Disp: , Rfl:    megestrol (MEGACE) 40 MG tablet, Take 40 mg by mouth daily., Disp: , Rfl:    meloxicam (MOBIC) 15 MG tablet, Take 15 mg by mouth daily., Disp: , Rfl:    metFORMIN (GLUCOPHAGE) 500 MG tablet, Take 500 mg by mouth 2 (two) times daily., Disp: , Rfl:    omeprazole (PRILOSEC) 40 MG capsule, Take 40  mg by mouth daily. , Disp: , Rfl:    vitamin B-12 (CYANOCOBALAMIN) 1000 MCG tablet, Take 1,000 mcg by mouth daily., Disp: , Rfl:   Physical exam:  Vitals:   02/24/21 0833  BP: (!) 141/72  Pulse: 91  Resp: 18  Temp: 98.7 F (37.1 C)  TempSrc: Tympanic  SpO2: 100%  Weight: 283 lb (128.4 kg)  Height: 5\' 7"  (1.702 m)   Physical Exam Constitutional:      Appearance: She is not ill-appearing.  Cardiovascular:     Rate and Rhythm: Regular rhythm. Tachycardia present.  Pulmonary:     Effort: Pulmonary effort is normal. No respiratory distress.  Abdominal:     Hernia: A hernia is present.  Skin:    General: Skin is dry.     Coloration: Skin is not pale.  Neurological:     Mental Status: She is alert and oriented to person, place, and time.  Psychiatric:        Attention and Perception: Attention normal.        Mood and Affect: Mood is anxious.        Speech: Speech normal.        Behavior: Behavior is cooperative.     CMP Latest Ref Rng & Units 02/09/2021  Glucose 70 - 99 mg/dL 106(H)  BUN 8 - 23  mg/dL 15  Creatinine 0.44 - 1.00 mg/dL 0.66  Sodium 135 - 145 mmol/L 135  Potassium 3.5 - 5.1 mmol/L 4.1  Chloride 98 - 111 mmol/L 104  CO2 22 - 32 mmol/L 22  Calcium 8.9 - 10.3 mg/dL 9.3  Total Protein 6.5 - 8.1 g/dL 7.5  Total Bilirubin 0.3 - 1.2 mg/dL <0.1(L)  Alkaline Phos 38 - 126 U/L 58  AST 15 - 41 U/L 12(L)  ALT 0 - 44 U/L 9   CBC Latest Ref Rng & Units 02/24/2021  WBC 4.0 - 10.5 K/uL 11.0(H)  Hemoglobin 12.0 - 15.0 g/dL 8.9(L)  Hematocrit 36.0 - 46.0 % 27.9(L)  Platelets 150 - 400 K/uL 336   Iron/TIBC/Ferritin/ %Sat    Component Value Date/Time   IRON 22 (L) 02/18/2021 0850   TIBC 333 02/18/2021 0850   FERRITIN 19 02/18/2021 0850   IRONPCTSAT 7 (L) 02/18/2021 0850     No images are attached to the encounter.  MR PELVIS W WO CONTRAST  Result Date: 02/16/2021 CLINICAL DATA:  Uterine/cervical cancer, assess myometrial invasion in the setting of endometrial cancer. History of endometrial ablation and prior hernia repair. EXAM: MRI PELVIS WITHOUT AND WITH CONTRAST TECHNIQUE: Multiplanar multisequence MR imaging of the pelvis was performed both before and after administration of intravenous contrast. CONTRAST:  82mL MULTIHANCE GADOBENATE DIMEGLUMINE 529 MG/ML IV SOLN COMPARISON:  None FINDINGS: Urinary Tract: Urinary bladder is unremarkable. Under distension mildly limiting assessment. No distal ureteral dilation. Bowel: Gaseous distension of the rectum. No acute bowel finding to the extent evaluated on this pelvic MR centered about the uterus. Vascular/Lymphatic: Small bilateral pelvic sidewall lymph nodes largest up to 9 mm along the LEFT pelvic sidewall though with fatty hila of this, the largest lymph node in the pelvis. Mild prominence of bilateral groin lymph nodes. Vascular structures with limited assessment. Grossly patent and normal caliber. Reproductive: Lobular and in homogeneous appearance of the uterus with loss of normal zonal anatomy. Bland appearing T2 hypointensity  with corresponding diffusion weighted abnormality extends across the myometrium. Irregular appearing endometrial contour with dilation of the endometrium in the uterine fundus. Abnormal signal crossing the myometrium, much  greater than 50% and some places extending to the expected level of the serosa of the uterus Nodular area along the uterine fundus suggests local extra uterine extension (image 26/4) this can be better seen on image 30/3 where there is a discrete nodule that extends from the uterine fundus that matches the signal crossing myometrial boundaries. Also in the RIGHT parametrium there is no overlying myometrial signal (image 27/3) focal bulging of the uterine contour at this level is demonstrated. Greatest thickness of tumor approaching 2.4 cm. Greatest thickness of tumor and myometrium 2.8 cm. These are measured in different areas Ovaries are not clearly seen. There is an area in the region of the RIGHT adnexa that measures 8 mm (image 31/2) this small nodular area could represent the ovary though small peritoneal nodule given findings at the uterine fundus is also considered. Small uterine leiomyoma likely along the LEFT lateral uterus 16 mm. Other: No ascites. Very large ventral hernia with 13 cm rectus separation above the symphysis pubis. Not well assessed given field of view constraints. Musculoskeletal: Signs of lumbar spinal fusion with associated susceptibility artifact. IMPRESSION: 1. Signs of diffuse endometrial cancer within the uterus up to 90% of the myometrial thickness is involved. Tumor appears to extend to serosa in the RIGHT parametrium. Potential nodular area extending beyond the uterine fundus as well though on sagittal postcontrast images this cannot be connected to subjacent tumor within the uterus in appears separated from tumor by myometrium. 2. Indeterminate area in the RIGHT pelvis follows broad ligament and may represent an atrophic ovary. Given above findings PET scan may  be helpful for complete staging. 3. Large hiatal hernia with 13 cm rectus separation above the symphysis pubis. Electronically Signed   By: Zetta Bills M.D.   On: 02/16/2021 16:23   NM PET Image Initial (PI) Skull Base To Thigh  Result Date: 02/23/2021 CLINICAL DATA:  Initial treatment strategy for uterine/cervical cancer, staging. EXAM: NUCLEAR MEDICINE PET SKULL BASE TO THIGH TECHNIQUE: 13.9 mCi F-18 FDG was injected intravenously. Full-ring PET imaging was performed from the skull base to thigh after the radiotracer. CT data was obtained and used for attenuation correction and anatomic localization. Fasting blood glucose: 126 mg/dl COMPARISON:  Pelvic MRI of 02/15/2021.  Abdominopelvic CT 09/30/2014 FINDINGS: Mild limitations secondary to patient body habitus. Mediastinal blood pool activity: SUV max 2.6 Liver activity: SUV max NA NECK: No areas of abnormal hypermetabolism. Incidental CT findings: No cervical adenopathy. CHEST: No pulmonary parenchymal or thoracic nodal hypermetabolism. Incidental CT findings: Lad and aortic coronary artery calcification. Cardiomegaly. ABDOMEN/PELVIS: The uterus is diffusely hypermetabolic, consistent with locally advanced endometrial carcinoma. Example at a S.U.V. max of 17.0 on 177/4. No right adnexal hypermetabolism to correspond to the MRI abnormality. A retroaortic node measures 6 mm and a S.U.V. max of 3.3 on 146/4. New or enlarged compared to 09/30/2014. Right external iliac nodes measure maximally 6 mm and a S.U.V. max of 3.2 on 174/4. These have enlarged from 09/30/2014. Bilateral mildly hypermetabolic inguinal nodes. Example on the left at 1.0 cm and a S.U.V. max of 2.9 on 192/4. 6 mm back in 2016. Incidental CT findings: Abdominal aortic atherosclerosis. Normal adrenal glands. Cholecystectomy. Normal noncontrast appearance of the stomach, pancreas, spleen. Too small to characterize bilateral renal lesions. An upper pole right renal 7 mm hyperattenuating lesion  on 133/4 is subtly present back in 2016, favoring a hemorrhagic/proteinaceous cyst. Ventral abdominal wall laxity and left-sided hernia are incompletely imaged but containnonobstructive bowel. SKELETON: No abnormal marrow activity.  Incidental CT findings: Lumbar spine fixation. Cervical spine fixation. IMPRESSION: 1. Mild limitations secondary to patient body habitus. 2. Hypermetabolic uterine primary. 3. Mildly hypermetabolic abdominal retroperitoneal and pelvic nodes. The inguinal nodes are likely reactive and are typical in the setting of obesity. The right external iliac and abdominal retroperitoneal nodes are equivocal, mildly suspicious for metastatic disease. 4. No supradiaphragmatic metastatic disease identified. 5. Incidental findings, including: Coronary artery atherosclerosis. Aortic Atherosclerosis (ICD10-I70.0). Electronically Signed   By: Abigail Miyamoto M.D.   On: 02/23/2021 09:38    Assessment and plan- Patient is a 71 y.o. female diagnosed with    Abnormal vaginal and uterine bleeding- secondary to known endometrial malignancy. She will start palliative radiation for bleeding today.  Symptomatic anemia- hemoglobin has improved to 8.9 s/p 2 units of pRBCs. Her bleeding will likely slow with radiation. Recommend IV iron today. Venofer preferred by insurance. Anticipate 2-3 doses of weekly venofer but will check her counts next week to ensure bleeding has slowed and counts are improving.  Endometrial Cancer- patient was discussed at [facility] today and her MRI was reviewed. PET was reviewed which did not show evidence of widely metastatic disease. Lymph nodes possibly reactive. I've forwarded images to Dr. Fransisca Connors for review. He anticipates completing radiation then follow up for additional treatment planning as scheduled.   Hernia- large abdominal hernia. Possible plan for joint surgical intervention at time of patient's possible hysterectomy.  Anxiety associated with cancer diagnosis-  continue citalopram. Continue xanax for short acting anxiety control   Disposition:  Venofer today.  No blood transfusion RTC on Monday for lab, follow up and possible iron vs blood- la  Visit Diagnosis 1. Endometrial cancer ([place])   2. Abnormal vaginal bleeding   3. Symptomatic anemia   4. Anxiety associated with cancer diagnosis [facility])    Patient expressed understanding and was in agreement with this plan. She also understands that She can call clinic at any time with any questions, concerns, or complaints.   Thank you for allowing me to participate in the care of this very pleasant patient.   Beckey Rutter, DNP, AGNP-C [place] [facility] [place]

## 2021-02-25 ENCOUNTER — Ambulatory Visit
Admission: RE | Admit: 2021-02-25 | Discharge: 2021-02-25 | Disposition: A | Discharge status: Home / Self Care | Payer: Medicare HMO | Source: Ambulatory Visit | Attending: Radiation Oncology | Admitting: Radiation Oncology

## 2021-02-25 DIAGNOSIS — Z510 Encounter for antineoplastic radiation therapy: Secondary | ICD-10-CM | POA: Diagnosis not present

## 2021-02-28 ENCOUNTER — Other Ambulatory Visit: Payer: Self-pay

## 2021-02-28 ENCOUNTER — Inpatient Hospital Stay: Payer: Medicare HMO

## 2021-02-28 ENCOUNTER — Encounter: Payer: Self-pay | Admitting: Nurse Practitioner

## 2021-02-28 ENCOUNTER — Ambulatory Visit
Admission: RE | Admit: 2021-02-28 | Discharge: 2021-02-28 | Disposition: A | Discharge status: Home / Self Care | Payer: Medicare HMO | Source: Ambulatory Visit | Attending: Radiation Oncology | Admitting: Radiation Oncology

## 2021-02-28 ENCOUNTER — Inpatient Hospital Stay (HOSPITAL_BASED_OUTPATIENT_CLINIC_OR_DEPARTMENT_OTHER): Payer: Medicare HMO | Admitting: Nurse Practitioner

## 2021-02-28 VITALS — BP 126/50 | HR 78 | Temp 98.2°F | Resp 18 | Ht 67.0 in | Wt 283.0 lb

## 2021-02-28 VITALS — BP 119/55 | HR 75 | Resp 16

## 2021-02-28 DIAGNOSIS — N950 Postmenopausal bleeding: Secondary | ICD-10-CM

## 2021-02-28 DIAGNOSIS — N939 Abnormal uterine and vaginal bleeding, unspecified: Secondary | ICD-10-CM

## 2021-02-28 DIAGNOSIS — Z510 Encounter for antineoplastic radiation therapy: Secondary | ICD-10-CM | POA: Diagnosis not present

## 2021-02-28 DIAGNOSIS — C541 Malignant neoplasm of endometrium: Secondary | ICD-10-CM

## 2021-02-28 DIAGNOSIS — D649 Anemia, unspecified: Secondary | ICD-10-CM

## 2021-02-28 LAB — CBC WITH DIFFERENTIAL/PLATELET
Abs Immature Granulocytes: 0.03 10*3/uL (ref 0.00–0.07)
Basophils Absolute: 0.1 10*3/uL (ref 0.0–0.1)
Basophils Relative: 1 %
Eosinophils Absolute: 0.2 10*3/uL (ref 0.0–0.5)
Eosinophils Relative: 2 %
HCT: 29.7 % — ABNORMAL LOW (ref 36.0–46.0)
Hemoglobin: 9.3 g/dL — ABNORMAL LOW (ref 12.0–15.0)
Immature Granulocytes: 0 %
Lymphocytes Relative: 14 %
Lymphs Abs: 1.3 10*3/uL (ref 0.7–4.0)
MCH: 27.4 pg (ref 26.0–34.0)
MCHC: 31.3 g/dL (ref 30.0–36.0)
MCV: 87.4 fL (ref 80.0–100.0)
Monocytes Absolute: 0.5 10*3/uL (ref 0.1–1.0)
Monocytes Relative: 5 %
Neutro Abs: 7.1 10*3/uL (ref 1.7–7.7)
Neutrophils Relative %: 78 %
Platelets: 343 10*3/uL (ref 150–400)
RBC: 3.4 MIL/uL — ABNORMAL LOW (ref 3.87–5.11)
RDW: 15.4 % (ref 11.5–15.5)
WBC: 9.2 10*3/uL (ref 4.0–10.5)
nRBC: 0 % (ref 0.0–0.2)

## 2021-02-28 LAB — SAMPLE TO BLOOD BANK

## 2021-02-28 MED ORDER — SODIUM CHLORIDE 0.9 % IV SOLN: 200.0000 mg | Freq: Once | INTRAVENOUS | Status: DC

## 2021-02-28 MED ORDER — IRON SUCROSE 20 MG/ML IV SOLN
200.0000 mg | Freq: Once | INTRAVENOUS | Status: AC
  Administered 2021-02-28: 200 mg via INTRAVENOUS
  Filled 2021-02-28: qty 10

## 2021-02-28 MED ORDER — SODIUM CHLORIDE 0.9 % IV SOLN
Freq: Once | INTRAVENOUS | Status: AC
  Filled 2021-02-28: qty 250

## 2021-02-28 NOTE — Progress Notes (Signed)
Pt reports small pink tinge when she wiped this morning. Reports no recent bleeding or clots.

## 2021-02-28 NOTE — Progress Notes (Signed)
Sympt[facility] Management [place] [facility] [place]. [facility] [address], [place], [place] [address] [phone] (phone) [phone] (fax)  Patient Care Team: [name], MD as PCP - General (Internal Medicine) Mellody Drown, MD as Consulting Physician (Gynecologic Oncology) Verlon Au, NP as Nurse Practitioner (Gynecologic Oncology) Benjaman Kindler, MD as Consulting Physician (Obstetrics and Gynecology) Hollice Espy, MD as Consulting Physician (Urology)   Name of the patient: Selena Rodgers  [id number]  11-Apr-1950   Date of visit: 02/28/21  Diagnosis- grade 1 endometrial cancer  Chief complaint/ Reason for visit- Heavy vaginal bleeding & Anemia  Heme/Onc history:  Selena Rodgers is a 71 y.o. G66 female who is seen in consultation from Dr. Leafy Ro for endometrial cancer.   Seen for PMB in 11/22 by Dr Leafy Ro.  Prior endometrial ablation in 2000.    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   Took some Megace, but bleeding got worse, so stopped.   Bad back limits her mobility. Chronic pain.    She has gigantic lower abdominal hernia s/p prior unsuccessful repair with mesh due to incarcerated bowel.   MRI - 02/15/21 1. Signs of diffuse endometrial cancer within the uterus up to 90% of the myometrial thickness is involved. Tumor appears to extend to serosa in the RIGHT parametrium. Potential nodular area extending beyond the uterine fundus as well though on sagittal postcontrast images this cannot be connected to subjacent tumor within the uterus in appears separated from tumor by myometrium. 2. Indeterminate area in the RIGHT pelvis follows broad ligament and may represent an atrophic ovary. Given above findings PET scan may be helpful for complete staging. 3. Large hiatal hernia with 13 cm rectus separation above the symphysis  pubis.  Oncology History   No history exists.    Interval history-patient is 71 year old female with above history of endometrial cancer.  She returns to clinic today for labs and possible blood transfusion.  She received 1 unit of PRBC on Friday and again on Monday. She received IV venofer last week. Tolerated well. Has now started radiation. Bleeding has nearly stopped and she has scant pink discharge.   Review of systems- Review of Systems  Constitutional:  Negative for chills, fever, malaise/fatigue and weight loss.  HENT:  Negative for hearing loss, nosebleeds, sore throat and tinnitus.   Eyes:  Negative for blurred vision and double vision.  Respiratory:  Negative for cough, hemoptysis, shortness of breath and wheezing.   Cardiovascular:  Negative for chest pain, palpitations and leg swelling.  Gastrointestinal:  Negative for abdominal pain, blood in stool, constipation, diarrhea, melena, nausea and vomiting.  Genitourinary:  Negative for dysuria and urgency.  Musculoskeletal:  Negative for back pain, falls, joint pain and myalgias.  Skin:  Negative for itching and rash.  Neurological:  Negative for dizziness, tingling, sensory change, loss of consciousness, weakness and headaches.  Endo/Heme/Allergies:  Negative for environmental allergies. Does not bruise/bleed easily.  Psychiatric/Behavioral:  Negative for depression. The patient is nervous/anxious (improved). The patient does not have insomnia.       Allergies  Allergen Reactions   Chlorpheniramine Anaphylaxis   Linseed Oil Anaphylaxis   Codeine Hives   Flax Seed Oil [Bio-Flax] Swelling    UNSPECIFIED    Latex Hives and Other (See Comments)    BLISTERS   Tape Other (See Comments)    BLISTERS   Tapentadol Other (See Comments)  BLISTERS   Rosemary Oil Swelling    Lip swelling   2,4-D Dimethylamine Rash    T-DAP   Aspirin Rash    Past Medical History:  Diagnosis Date   Anxiety    Arthritis    Chronic back pain     Endometrial cancer ([facility]) 02/09/2021   GERD (gastroesophageal reflux disease)    Hypertension    Post-menopausal bleeding    Spinal stenosis    Umbilical hernia    Uterine cancer ([place])    Ventral hernia     Past Surgical History:  Procedure Laterality Date   ANTERIOR CERVICAL DECOMP/DISCECTOMY FUSION N/A 04/19/2015   Procedure: ANTERIOR CERVICAL DECOMPRESSION/DISCECTOMY FUSION INTERBODY PROTHESIS PLATING BONEGRAFT CERVICAL THREE-FOUR ,CERVICAL FOUR-FIVE;  Surgeon: Newman Pies, MD;  Location: [facility];  Service: Neurosurgery;  Laterality: N/A;   BREAST BIOPSY     CARPAL TUNNEL RELEASE     CHOLECYSTECTOMY     FOOT SURGERY Right    IRRIGATION AND DEBRIDEMENT ABDOMEN  01/10/2014   Dr. Marina Gravel   UMBILICAL HERNIA REPAIR     VENTRAL HERNIA REPAIR  12/26/2013   Dr. Pat Patrick   WOUND EXPLORATION N/A 07/14/2019   Procedure: LUMBAR EXPLORATION EVACUATION OF HEMATOMA;  Surgeon: Newman Pies, MD;  Location: [facility];  Service: Neurosurgery;  Laterality: N/A;    Social History   Socioeconomic History   Marital status: Single    Spouse name: Not on file   Number of children: Not on file   Years of education: Not on file   Highest education level: Not on file  Occupational History   Not on file  Tobacco Use   Smoking status: Some Days   Smokeless tobacco: Never   Tobacco comments:    5 cigs daily  Vaping Use   Vaping Use: Never used  Substance and Sexual Activity   Alcohol use: No    Alcohol/week: 0.0 standard drinks   Drug use: No   Sexual activity: Not Currently    Birth control/protection: None  Other Topics Concern   Not on file  Social History Narrative   Not on file   Social Determinants of Health   Financial Resource Strain: Not on file  Food Insecurity: Not on file  Transportation Needs: Not on file  Physical Activity: Not on file  Stress: Not on file  Social Connections: Not on file  Intimate Partner Violence: Not on file    Family History  Problem Relation  Age of Onset   Diabetes Maternal Grandmother    Diabetes Maternal Grandfather    Diabetes Paternal Grandmother    Diabetes Paternal Grandfather    Hypertension Mother      Current Outpatient Medications:    acetaminophen (TYLENOL) 500 MG tablet, Take 2 tablets (1,000 mg total) by mouth 3 (three) times daily., Disp: 30 tablet, Rfl: 0   ALPRAZolam (XANAX) 0.5 MG tablet, Take 0.5-1 tablets (0.25-0.5 mg total) by mouth 2 (two) times daily as needed for anxiety., Disp: 30 tablet, Rfl: 0   citalopram (CELEXA) 20 MG tablet, Take 20 mg by mouth daily., Disp: , Rfl:    megestrol (MEGACE) 40 MG tablet, Take 40 mg by mouth daily., Disp: , Rfl:    meloxicam (MOBIC) 15 MG tablet, Take 15 mg by mouth daily., Disp: , Rfl:    metFORMIN (GLUCOPHAGE) 500 MG tablet, Take 500 mg by mouth 2 (two) times daily., Disp: , Rfl:    omeprazole (PRILOSEC) 40 MG capsule, Take 40 mg by mouth daily. , Disp: ,  Rfl:    vitamin B-12 (CYANOCOBALAMIN) 1000 MCG tablet, Take 1,000 mcg by mouth daily., Disp: , Rfl:   Physical exam:  Vitals:   02/28/21 1051  BP: (!) 126/50  Pulse: 78  Resp: 18  Temp: 98.2 F (36.8 C)  SpO2: 99%  Weight: 283 lb (128.4 kg)  Height: 5\' 7"  (1.702 m)   Physical Exam Constitutional:      Appearance: She is not ill-appearing.  Cardiovascular:     Rate and Rhythm: Normal rate and regular rhythm.  Pulmonary:     Effort: Pulmonary effort is normal. No respiratory distress.  Abdominal:     Hernia: A hernia is present.  Skin:    General: Skin is dry.     Coloration: Skin is not pale.  Neurological:     Mental Status: She is alert and oriented to person, place, and time.  Psychiatric:        Attention and Perception: Attention normal.        Mood and Affect: Affect normal. Mood is not anxious.        Speech: Speech normal.        Behavior: Behavior is cooperative.     CMP Latest Ref Rng & Units 02/09/2021  Glucose 70 - 99 mg/dL 106(H)  BUN 8 - 23 mg/dL 15  Creatinine 0.44 - 1.00  mg/dL 0.66  Sodium 135 - 145 mmol/L 135  Potassium 3.5 - 5.1 mmol/L 4.1  Chloride 98 - 111 mmol/L 104  CO2 22 - 32 mmol/L 22  Calcium 8.9 - 10.3 mg/dL 9.3  Total Protein 6.5 - 8.1 g/dL 7.5  Total Bilirubin 0.3 - 1.2 mg/dL <0.1(L)  Alkaline Phos 38 - 126 U/L 58  AST 15 - 41 U/L 12(L)  ALT 0 - 44 U/L 9   CBC Latest Ref Rng & Units 02/28/2021  WBC 4.0 - 10.5 K/uL 9.2  Hemoglobin 12.0 - 15.0 g/dL 9.3(L)  Hematocrit 36.0 - 46.0 % 29.7(L)  Platelets 150 - 400 K/uL 343   Iron/TIBC/Ferritin/ %Sat    Component Value Date/Time   IRON 22 (L) 02/18/2021 0850   TIBC 333 02/18/2021 0850   FERRITIN 19 02/18/2021 0850   IRONPCTSAT 7 (L) 02/18/2021 0850     No images are attached to the encounter.  MR PELVIS W WO CONTRAST  Result Date: 02/16/2021 CLINICAL DATA:  Uterine/cervical cancer, assess myometrial invasion in the setting of endometrial cancer. History of endometrial ablation and prior hernia repair. EXAM: MRI PELVIS WITHOUT AND WITH CONTRAST TECHNIQUE: Multiplanar multisequence MR imaging of the pelvis was performed both before and after administration of intravenous contrast. CONTRAST:  46mL MULTIHANCE GADOBENATE DIMEGLUMINE 529 MG/ML IV SOLN COMPARISON:  None FINDINGS: Urinary Tract: Urinary bladder is unremarkable. Under distension mildly limiting assessment. No distal ureteral dilation. Bowel: Gaseous distension of the rectum. No acute bowel finding to the extent evaluated on this pelvic MR centered about the uterus. Vascular/Lymphatic: Small bilateral pelvic sidewall lymph nodes largest up to 9 mm along the LEFT pelvic sidewall though with fatty hila of this, the largest lymph node in the pelvis. Mild prominence of bilateral groin lymph nodes. Vascular structures with limited assessment. Grossly patent and normal caliber. Reproductive: Lobular and in homogeneous appearance of the uterus with loss of normal zonal anatomy. Bland appearing T2 hypointensity with corresponding diffusion weighted  abnormality extends across the myometrium. Irregular appearing endometrial contour with dilation of the endometrium in the uterine fundus. Abnormal signal crossing the myometrium, much greater than 50% and some places  extending to the expected level of the serosa of the uterus Nodular area along the uterine fundus suggests local extra uterine extension (image 26/4) this can be better seen on image 30/3 where there is a discrete nodule that extends from the uterine fundus that matches the signal crossing myometrial boundaries. Also in the RIGHT parametrium there is no overlying myometrial signal (image 27/3) focal bulging of the uterine contour at this level is demonstrated. Greatest thickness of tumor approaching 2.4 cm. Greatest thickness of tumor and myometrium 2.8 cm. These are measured in different areas Ovaries are not clearly seen. There is an area in the region of the RIGHT adnexa that measures 8 mm (image 31/2) this small nodular area could represent the ovary though small peritoneal nodule given findings at the uterine fundus is also considered. Small uterine leiomyoma likely along the LEFT lateral uterus 16 mm. Other: No ascites. Very large ventral hernia with 13 cm rectus separation above the symphysis pubis. Not well assessed given field of view constraints. Musculoskeletal: Signs of lumbar spinal fusion with associated susceptibility artifact. IMPRESSION: 1. Signs of diffuse endometrial cancer within the uterus up to 90% of the myometrial thickness is involved. Tumor appears to extend to serosa in the RIGHT parametrium. Potential nodular area extending beyond the uterine fundus as well though on sagittal postcontrast images this cannot be connected to subjacent tumor within the uterus in appears separated from tumor by myometrium. 2. Indeterminate area in the RIGHT pelvis follows broad ligament and may represent an atrophic ovary. Given above findings PET scan may be helpful for complete staging. 3.  Large hiatal hernia with 13 cm rectus separation above the symphysis pubis. Electronically Signed   By: Zetta Bills M.D.   On: 02/16/2021 16:23   NM PET Image Initial (PI) Skull Base To Thigh  Result Date: 02/23/2021 CLINICAL DATA:  Initial treatment strategy for uterine/cervical cancer, staging. EXAM: NUCLEAR MEDICINE PET SKULL BASE TO THIGH TECHNIQUE: 13.9 mCi F-18 FDG was injected intravenously. Full-ring PET imaging was performed from the skull base to thigh after the radiotracer. CT data was obtained and used for attenuation correction and anatomic localization. Fasting blood glucose: 126 mg/dl COMPARISON:  Pelvic MRI of 02/15/2021.  Abdominopelvic CT 09/30/2014 FINDINGS: Mild limitations secondary to patient body habitus. Mediastinal blood pool activity: SUV max 2.6 Liver activity: SUV max NA NECK: No areas of abnormal hypermetabolism. Incidental CT findings: No cervical adenopathy. CHEST: No pulmonary parenchymal or thoracic nodal hypermetabolism. Incidental CT findings: Lad and aortic coronary artery calcification. Cardiomegaly. ABDOMEN/PELVIS: The uterus is diffusely hypermetabolic, consistent with locally advanced endometrial carcinoma. Example at a S.U.V. max of 17.0 on 177/4. No right adnexal hypermetabolism to correspond to the MRI abnormality. A retroaortic node measures 6 mm and a S.U.V. max of 3.3 on 146/4. New or enlarged compared to 09/30/2014. Right external iliac nodes measure maximally 6 mm and a S.U.V. max of 3.2 on 174/4. These have enlarged from 09/30/2014. Bilateral mildly hypermetabolic inguinal nodes. Example on the left at 1.0 cm and a S.U.V. max of 2.9 on 192/4. 6 mm back in 2016. Incidental CT findings: Abdominal aortic atherosclerosis. Normal adrenal glands. Cholecystectomy. Normal noncontrast appearance of the stomach, pancreas, spleen. Too small to characterize bilateral renal lesions. An upper pole right renal 7 mm hyperattenuating lesion on 133/4 is subtly present back in  2016, favoring a hemorrhagic/proteinaceous cyst. Ventral abdominal wall laxity and left-sided hernia are incompletely imaged but containnonobstructive bowel. SKELETON: No abnormal marrow activity. Incidental CT findings: Lumbar spine fixation.  Cervical spine fixation. IMPRESSION: 1. Mild limitations secondary to patient body habitus. 2. Hypermetabolic uterine primary. 3. Mildly hypermetabolic abdominal retroperitoneal and pelvic nodes. The inguinal nodes are likely reactive and are typical in the setting of obesity. The right external iliac and abdominal retroperitoneal nodes are equivocal, mildly suspicious for metastatic disease. 4. No supradiaphragmatic metastatic disease identified. 5. Incidental findings, including: Coronary artery atherosclerosis. Aortic Atherosclerosis (ICD10-I70.0). Electronically Signed   By: Abigail Miyamoto M.D.   On: 02/23/2021 09:38    Assessment and plan- Patient is a 71 y.o. female diagnosed with    Abnormal vaginal and uterine bleeding- secondary to known endometrial malignancy. She has seen improvement in her bleeding with radiation.  Symptomatic anemia- hemoglobin has improved to 9.3. s/p 2 units of pRBCs and venofer x 1. Proceed with venofer today and again next week. Previously ferritin was 19 and iron saturation 7%.   Endometrial Cancer- patient was discussed at [facility] today and her MRI was reviewed. PET was reviewed which did not show evidence of widely metastatic disease. Lymph nodes possibly reactive. I've forwarded images to Dr. Fransisca Connors for review. He anticipates completing radiation then follow up for additional treatment planning as scheduled.  Follow up appt with gyn-onc scheduled for 04/06/21.  Hernia- large abdominal hernia. Possible plan for joint surgical intervention at time of patient's possible hysterectomy.  Anxiety associated with cancer diagnosis- continue citalopram. Continue xanax for short acting anxiety control   Disposition:  Venofer today   Venofer next week Follow up with gyn-onc on 4/5 after she has completed radiation for treatment planning.   Visit Diagnosis 1. Abnormal vaginal bleeding   2. Symptomatic anemia   3. Endometrial cancer [facility])    Patient expressed understanding and was in agreement with this plan. She also understands that She can call clinic at any time with any questions, concerns, or complaints.   Thank you for allowing me to participate in the care of this very pleasant patient.   Beckey Rutter, DNP, AGNP-C [place] [facility] [place]

## 2021-03-01 ENCOUNTER — Ambulatory Visit
Admission: RE | Admit: 2021-03-01 | Discharge: 2021-03-01 | Disposition: A | Discharge status: Home / Self Care | Payer: Medicare HMO | Source: Ambulatory Visit | Attending: Radiation Oncology | Admitting: Radiation Oncology

## 2021-03-01 DIAGNOSIS — Z510 Encounter for antineoplastic radiation therapy: Secondary | ICD-10-CM | POA: Diagnosis not present

## 2021-03-02 ENCOUNTER — Ambulatory Visit
Admission: RE | Admit: 2021-03-02 | Discharge: 2021-03-02 | Disposition: A | Discharge status: Home / Self Care | Payer: Medicare HMO | Source: Ambulatory Visit | Attending: Radiation Oncology | Admitting: Radiation Oncology

## 2021-03-02 DIAGNOSIS — Z510 Encounter for antineoplastic radiation therapy: Secondary | ICD-10-CM | POA: Diagnosis not present

## 2021-03-02 DIAGNOSIS — C541 Malignant neoplasm of endometrium: Secondary | ICD-10-CM | POA: Diagnosis present

## 2021-03-03 ENCOUNTER — Ambulatory Visit
Admission: RE | Admit: 2021-03-03 | Discharge: 2021-03-03 | Disposition: A | Discharge status: Home / Self Care | Payer: Medicare HMO | Source: Ambulatory Visit | Attending: Radiation Oncology | Admitting: Radiation Oncology

## 2021-03-03 DIAGNOSIS — Z510 Encounter for antineoplastic radiation therapy: Secondary | ICD-10-CM | POA: Diagnosis not present

## 2021-03-04 ENCOUNTER — Ambulatory Visit
Admission: RE | Admit: 2021-03-04 | Discharge: 2021-03-04 | Disposition: A | Discharge status: Home / Self Care | Payer: Medicare HMO | Source: Ambulatory Visit | Attending: Radiation Oncology | Admitting: Radiation Oncology

## 2021-03-04 DIAGNOSIS — Z510 Encounter for antineoplastic radiation therapy: Secondary | ICD-10-CM | POA: Diagnosis not present

## 2021-03-07 ENCOUNTER — Inpatient Hospital Stay: Payer: Medicare HMO | Attending: Obstetrics and Gynecology

## 2021-03-07 ENCOUNTER — Other Ambulatory Visit: Payer: Self-pay

## 2021-03-07 ENCOUNTER — Ambulatory Visit
Admission: RE | Admit: 2021-03-07 | Discharge: 2021-03-07 | Disposition: A | Discharge status: Home / Self Care | Payer: Medicare HMO | Source: Ambulatory Visit | Attending: Radiation Oncology | Admitting: Radiation Oncology

## 2021-03-07 VITALS — BP 130/64 | HR 84 | Temp 97.4°F | Resp 18

## 2021-03-07 DIAGNOSIS — D649 Anemia, unspecified: Secondary | ICD-10-CM | POA: Insufficient documentation

## 2021-03-07 DIAGNOSIS — C541 Malignant neoplasm of endometrium: Secondary | ICD-10-CM | POA: Diagnosis present

## 2021-03-07 DIAGNOSIS — N950 Postmenopausal bleeding: Secondary | ICD-10-CM

## 2021-03-07 DIAGNOSIS — Z510 Encounter for antineoplastic radiation therapy: Secondary | ICD-10-CM | POA: Diagnosis not present

## 2021-03-07 MED ORDER — IRON SUCROSE 20 MG/ML IV SOLN
200.0000 mg | Freq: Once | INTRAVENOUS | Status: AC
  Administered 2021-03-07: 200 mg via INTRAVENOUS
  Filled 2021-03-07: qty 10

## 2021-03-07 MED ORDER — SODIUM CHLORIDE 0.9 % IV SOLN
Freq: Once | INTRAVENOUS | Status: AC
  Filled 2021-03-07: qty 250

## 2021-03-07 MED ORDER — SODIUM CHLORIDE 0.9 % IV SOLN: 200.0000 mg | Freq: Once | INTRAVENOUS | Status: DC

## 2021-03-08 ENCOUNTER — Ambulatory Visit (HOSPITAL_COMMUNITY): Payer: Medicare HMO

## 2021-03-08 ENCOUNTER — Ambulatory Visit
Admission: RE | Admit: 2021-03-08 | Discharge: 2021-03-08 | Disposition: A | Discharge status: Home / Self Care | Payer: Medicare HMO | Source: Ambulatory Visit | Attending: Radiation Oncology | Admitting: Radiation Oncology

## 2021-03-08 DIAGNOSIS — Z510 Encounter for antineoplastic radiation therapy: Secondary | ICD-10-CM | POA: Diagnosis not present

## 2021-03-09 ENCOUNTER — Telehealth: Payer: Self-pay

## 2021-03-09 ENCOUNTER — Ambulatory Visit
Admission: RE | Admit: 2021-03-09 | Discharge: 2021-03-09 | Disposition: A | Discharge status: Home / Self Care | Payer: Medicare HMO | Source: Ambulatory Visit | Attending: Radiation Oncology | Admitting: Radiation Oncology

## 2021-03-09 DIAGNOSIS — Z510 Encounter for antineoplastic radiation therapy: Secondary | ICD-10-CM | POA: Diagnosis not present

## 2021-03-09 NOTE — Telephone Encounter (Signed)
Called [facility] for update on MMR/MSI testing. It is in the process of being reported. Will await results. ?

## 2021-03-10 ENCOUNTER — Ambulatory Visit
Admission: RE | Admit: 2021-03-10 | Discharge: 2021-03-10 | Disposition: A | Discharge status: Home / Self Care | Payer: Medicare HMO | Source: Ambulatory Visit | Attending: Radiation Oncology | Admitting: Radiation Oncology

## 2021-03-10 DIAGNOSIS — Z510 Encounter for antineoplastic radiation therapy: Secondary | ICD-10-CM | POA: Diagnosis not present

## 2021-03-11 ENCOUNTER — Ambulatory Visit
Admission: RE | Admit: 2021-03-11 | Discharge: 2021-03-11 | Disposition: A | Discharge status: Home / Self Care | Payer: Medicare HMO | Source: Ambulatory Visit | Attending: Radiation Oncology | Admitting: Radiation Oncology

## 2021-03-11 DIAGNOSIS — Z510 Encounter for antineoplastic radiation therapy: Secondary | ICD-10-CM | POA: Diagnosis not present

## 2021-03-14 ENCOUNTER — Ambulatory Visit
Admission: RE | Admit: 2021-03-14 | Discharge: 2021-03-14 | Disposition: A | Discharge status: Home / Self Care | Payer: Medicare HMO | Source: Ambulatory Visit | Attending: Radiation Oncology | Admitting: Radiation Oncology

## 2021-03-14 DIAGNOSIS — Z510 Encounter for antineoplastic radiation therapy: Secondary | ICD-10-CM | POA: Diagnosis not present

## 2021-03-15 ENCOUNTER — Ambulatory Visit
Admission: RE | Admit: 2021-03-15 | Discharge: 2021-03-15 | Disposition: A | Discharge status: Home / Self Care | Payer: Medicare HMO | Source: Ambulatory Visit | Attending: Radiation Oncology | Admitting: Radiation Oncology

## 2021-03-15 ENCOUNTER — Telehealth: Payer: Self-pay

## 2021-03-15 ENCOUNTER — Encounter: Payer: Self-pay | Admitting: Obstetrics and Gynecology

## 2021-03-15 DIAGNOSIS — Z510 Encounter for antineoplastic radiation therapy: Secondary | ICD-10-CM | POA: Diagnosis not present

## 2021-03-15 NOTE — Telephone Encounter (Signed)
Call placed to [facility] to follow up on results of MMR/MSI testing requested. Provided number for [facility], [phone], as they state this is where it was sent for the additional testing. Call placed to [facility] for provided specimen ID [id number]. They state they have the MMR stains and I have requested a faxed report for this. The MSI testing has not been completed stating there was not enough normal tissue for the control. They will follow up with [facility] to see if they have any additional tissue they can send and call us back.  ?

## 2021-03-16 ENCOUNTER — Other Ambulatory Visit: Payer: Self-pay

## 2021-03-16 ENCOUNTER — Emergency Department: Payer: Medicare HMO

## 2021-03-16 ENCOUNTER — Ambulatory Visit: Payer: Medicare HMO

## 2021-03-16 ENCOUNTER — Inpatient Hospital Stay
Admission: EM | Admit: 2021-03-16 | Discharge: 2021-03-29 | DRG: 602 | Disposition: A | Discharge status: Skilled Nursing Facility | Payer: Medicare HMO | Attending: Internal Medicine | Admitting: Internal Medicine

## 2021-03-16 DIAGNOSIS — Z8249 Family history of ischemic heart disease and other diseases of the circulatory system: Secondary | ICD-10-CM

## 2021-03-16 DIAGNOSIS — F17200 Nicotine dependence, unspecified, uncomplicated: Secondary | ICD-10-CM | POA: Diagnosis present

## 2021-03-16 DIAGNOSIS — J810 Acute pulmonary edema: Secondary | ICD-10-CM | POA: Diagnosis present

## 2021-03-16 DIAGNOSIS — Z6841 Body Mass Index (BMI) 40.0 and over, adult: Secondary | ICD-10-CM

## 2021-03-16 DIAGNOSIS — D72818 Other decreased white blood cell count: Secondary | ICD-10-CM | POA: Diagnosis present

## 2021-03-16 DIAGNOSIS — E222 Syndrome of inappropriate secretion of antidiuretic hormone: Secondary | ICD-10-CM | POA: Diagnosis present

## 2021-03-16 DIAGNOSIS — G9340 Encephalopathy, unspecified: Secondary | ICD-10-CM | POA: Diagnosis not present

## 2021-03-16 DIAGNOSIS — N939 Abnormal uterine and vaginal bleeding, unspecified: Secondary | ICD-10-CM

## 2021-03-16 DIAGNOSIS — E871 Hypo-osmolality and hyponatremia: Secondary | ICD-10-CM | POA: Diagnosis present

## 2021-03-16 DIAGNOSIS — D649 Anemia, unspecified: Secondary | ICD-10-CM | POA: Diagnosis present

## 2021-03-16 DIAGNOSIS — Z923 Personal history of irradiation: Secondary | ICD-10-CM

## 2021-03-16 DIAGNOSIS — C541 Malignant neoplasm of endometrium: Secondary | ICD-10-CM | POA: Diagnosis present

## 2021-03-16 DIAGNOSIS — K429 Umbilical hernia without obstruction or gangrene: Secondary | ICD-10-CM | POA: Diagnosis present

## 2021-03-16 DIAGNOSIS — E861 Hypovolemia: Secondary | ICD-10-CM | POA: Diagnosis present

## 2021-03-16 DIAGNOSIS — G8929 Other chronic pain: Secondary | ICD-10-CM | POA: Diagnosis present

## 2021-03-16 DIAGNOSIS — M549 Dorsalgia, unspecified: Secondary | ICD-10-CM | POA: Diagnosis present

## 2021-03-16 DIAGNOSIS — Z833 Family history of diabetes mellitus: Secondary | ICD-10-CM

## 2021-03-16 DIAGNOSIS — Z981 Arthrodesis status: Secondary | ICD-10-CM

## 2021-03-16 DIAGNOSIS — B957 Other staphylococcus as the cause of diseases classified elsewhere: Secondary | ICD-10-CM | POA: Diagnosis present

## 2021-03-16 DIAGNOSIS — E119 Type 2 diabetes mellitus without complications: Secondary | ICD-10-CM

## 2021-03-16 DIAGNOSIS — Z720 Tobacco use: Secondary | ICD-10-CM | POA: Diagnosis present

## 2021-03-16 DIAGNOSIS — L98499 Non-pressure chronic ulcer of skin of other sites with unspecified severity: Secondary | ICD-10-CM | POA: Diagnosis present

## 2021-03-16 DIAGNOSIS — J180 Bronchopneumonia, unspecified organism: Secondary | ICD-10-CM | POA: Diagnosis present

## 2021-03-16 DIAGNOSIS — Z20822 Contact with and (suspected) exposure to covid-19: Secondary | ICD-10-CM | POA: Diagnosis present

## 2021-03-16 DIAGNOSIS — Z7984 Long term (current) use of oral hypoglycemic drugs: Secondary | ICD-10-CM

## 2021-03-16 DIAGNOSIS — D638 Anemia in other chronic diseases classified elsewhere: Secondary | ICD-10-CM | POA: Diagnosis present

## 2021-03-16 DIAGNOSIS — I10 Essential (primary) hypertension: Secondary | ICD-10-CM | POA: Diagnosis present

## 2021-03-16 DIAGNOSIS — K219 Gastro-esophageal reflux disease without esophagitis: Secondary | ICD-10-CM | POA: Diagnosis present

## 2021-03-16 DIAGNOSIS — J189 Pneumonia, unspecified organism: Secondary | ICD-10-CM | POA: Diagnosis present

## 2021-03-16 DIAGNOSIS — R7881 Bacteremia: Secondary | ICD-10-CM | POA: Diagnosis present

## 2021-03-16 DIAGNOSIS — L03311 Cellulitis of abdominal wall: Secondary | ICD-10-CM | POA: Diagnosis not present

## 2021-03-16 DIAGNOSIS — R531 Weakness: Principal | ICD-10-CM

## 2021-03-16 DIAGNOSIS — E860 Dehydration: Secondary | ICD-10-CM | POA: Diagnosis present

## 2021-03-16 DIAGNOSIS — E876 Hypokalemia: Secondary | ICD-10-CM | POA: Diagnosis present

## 2021-03-16 DIAGNOSIS — L0390 Cellulitis, unspecified: Secondary | ICD-10-CM | POA: Diagnosis present

## 2021-03-16 DIAGNOSIS — F05 Delirium due to known physiological condition: Secondary | ICD-10-CM | POA: Diagnosis not present

## 2021-03-16 DIAGNOSIS — J918 Pleural effusion in other conditions classified elsewhere: Secondary | ICD-10-CM | POA: Diagnosis present

## 2021-03-16 DIAGNOSIS — Z79899 Other long term (current) drug therapy: Secondary | ICD-10-CM

## 2021-03-16 LAB — CBC WITH DIFFERENTIAL/PLATELET
Abs Immature Granulocytes: 0.07 10*3/uL (ref 0.00–0.07)
Basophils Absolute: 0 10*3/uL (ref 0.0–0.1)
Basophils Relative: 1 %
Eosinophils Absolute: 0 10*3/uL (ref 0.0–0.5)
Eosinophils Relative: 0 %
HCT: 31.1 % — ABNORMAL LOW (ref 36.0–46.0)
Hemoglobin: 9.7 g/dL — ABNORMAL LOW (ref 12.0–15.0)
Immature Granulocytes: 1 %
Lymphocytes Relative: 3 %
Lymphs Abs: 0.2 10*3/uL — ABNORMAL LOW (ref 0.7–4.0)
MCH: 27.3 pg (ref 26.0–34.0)
MCHC: 31.2 g/dL (ref 30.0–36.0)
MCV: 87.6 fL (ref 80.0–100.0)
Monocytes Absolute: 0.5 10*3/uL (ref 0.1–1.0)
Monocytes Relative: 5 %
Neutro Abs: 7.6 10*3/uL (ref 1.7–7.7)
Neutrophils Relative %: 90 %
Platelets: 153 10*3/uL (ref 150–400)
RBC: 3.55 MIL/uL — ABNORMAL LOW (ref 3.87–5.11)
RDW: 16.5 % — ABNORMAL HIGH (ref 11.5–15.5)
WBC: 8.4 10*3/uL (ref 4.0–10.5)
nRBC: 0 % (ref 0.0–0.2)

## 2021-03-16 NOTE — ED Triage Notes (Signed)
Arrives EMS from home with c/o generalized weakness over the last couple days. Pt reports sliding into the floor and being unable to get up. Hx of uterine cancer and receiving radiation. Cough for last couple days also.  ?

## 2021-03-17 ENCOUNTER — Ambulatory Visit: Payer: Medicare HMO

## 2021-03-17 ENCOUNTER — Observation Stay: Payer: Medicare HMO

## 2021-03-17 ENCOUNTER — Telehealth: Payer: Self-pay

## 2021-03-17 ENCOUNTER — Encounter: Payer: Self-pay | Admitting: Internal Medicine

## 2021-03-17 DIAGNOSIS — Z923 Personal history of irradiation: Secondary | ICD-10-CM | POA: Diagnosis not present

## 2021-03-17 DIAGNOSIS — E119 Type 2 diabetes mellitus without complications: Secondary | ICD-10-CM

## 2021-03-17 DIAGNOSIS — G9340 Encephalopathy, unspecified: Secondary | ICD-10-CM | POA: Diagnosis not present

## 2021-03-17 DIAGNOSIS — I10 Essential (primary) hypertension: Secondary | ICD-10-CM | POA: Diagnosis present

## 2021-03-17 DIAGNOSIS — D649 Anemia, unspecified: Secondary | ICD-10-CM | POA: Diagnosis not present

## 2021-03-17 DIAGNOSIS — Z8249 Family history of ischemic heart disease and other diseases of the circulatory system: Secondary | ICD-10-CM | POA: Diagnosis not present

## 2021-03-17 DIAGNOSIS — Z833 Family history of diabetes mellitus: Secondary | ICD-10-CM | POA: Diagnosis not present

## 2021-03-17 DIAGNOSIS — D638 Anemia in other chronic diseases classified elsewhere: Secondary | ICD-10-CM | POA: Diagnosis present

## 2021-03-17 DIAGNOSIS — C541 Malignant neoplasm of endometrium: Secondary | ICD-10-CM | POA: Diagnosis present

## 2021-03-17 DIAGNOSIS — J918 Pleural effusion in other conditions classified elsewhere: Secondary | ICD-10-CM | POA: Diagnosis present

## 2021-03-17 DIAGNOSIS — I340 Nonrheumatic mitral (valve) insufficiency: Secondary | ICD-10-CM | POA: Diagnosis not present

## 2021-03-17 DIAGNOSIS — R531 Weakness: Secondary | ICD-10-CM | POA: Diagnosis not present

## 2021-03-17 DIAGNOSIS — J189 Pneumonia, unspecified organism: Secondary | ICD-10-CM | POA: Diagnosis not present

## 2021-03-17 DIAGNOSIS — L03311 Cellulitis of abdominal wall: Secondary | ICD-10-CM | POA: Diagnosis present

## 2021-03-17 DIAGNOSIS — N939 Abnormal uterine and vaginal bleeding, unspecified: Secondary | ICD-10-CM | POA: Diagnosis not present

## 2021-03-17 DIAGNOSIS — Z7984 Long term (current) use of oral hypoglycemic drugs: Secondary | ICD-10-CM | POA: Diagnosis not present

## 2021-03-17 DIAGNOSIS — J180 Bronchopneumonia, unspecified organism: Secondary | ICD-10-CM | POA: Diagnosis present

## 2021-03-17 DIAGNOSIS — I361 Nonrheumatic tricuspid (valve) insufficiency: Secondary | ICD-10-CM | POA: Diagnosis not present

## 2021-03-17 DIAGNOSIS — J810 Acute pulmonary edema: Secondary | ICD-10-CM | POA: Diagnosis present

## 2021-03-17 DIAGNOSIS — B957 Other staphylococcus as the cause of diseases classified elsewhere: Secondary | ICD-10-CM | POA: Diagnosis not present

## 2021-03-17 DIAGNOSIS — Z6841 Body Mass Index (BMI) 40.0 and over, adult: Secondary | ICD-10-CM | POA: Diagnosis not present

## 2021-03-17 DIAGNOSIS — K219 Gastro-esophageal reflux disease without esophagitis: Secondary | ICD-10-CM | POA: Diagnosis present

## 2021-03-17 DIAGNOSIS — Z20822 Contact with and (suspected) exposure to covid-19: Secondary | ICD-10-CM | POA: Diagnosis present

## 2021-03-17 DIAGNOSIS — E222 Syndrome of inappropriate secretion of antidiuretic hormone: Secondary | ICD-10-CM | POA: Diagnosis present

## 2021-03-17 DIAGNOSIS — D72818 Other decreased white blood cell count: Secondary | ICD-10-CM | POA: Diagnosis present

## 2021-03-17 DIAGNOSIS — R7881 Bacteremia: Secondary | ICD-10-CM | POA: Diagnosis present

## 2021-03-17 DIAGNOSIS — Z79899 Other long term (current) drug therapy: Secondary | ICD-10-CM | POA: Diagnosis not present

## 2021-03-17 DIAGNOSIS — E861 Hypovolemia: Secondary | ICD-10-CM | POA: Diagnosis present

## 2021-03-17 DIAGNOSIS — E860 Dehydration: Secondary | ICD-10-CM | POA: Diagnosis present

## 2021-03-17 DIAGNOSIS — F05 Delirium due to known physiological condition: Secondary | ICD-10-CM | POA: Diagnosis not present

## 2021-03-17 HISTORY — DX: Type 2 diabetes mellitus without complications: E11.9

## 2021-03-17 LAB — URINALYSIS, COMPLETE (UACMP) WITH MICROSCOPIC
Bilirubin Urine: NEGATIVE
Glucose, UA: NEGATIVE mg/dL
Ketones, ur: 20 mg/dL — AB
Nitrite: NEGATIVE
Protein, ur: 100 mg/dL — AB
Specific Gravity, Urine: 1.033 — ABNORMAL HIGH (ref 1.005–1.030)
pH: 5 (ref 5.0–8.0)

## 2021-03-17 LAB — CBC
HCT: 27.8 % — ABNORMAL LOW (ref 36.0–46.0)
Hemoglobin: 8.7 g/dL — ABNORMAL LOW (ref 12.0–15.0)
MCH: 26.9 pg (ref 26.0–34.0)
MCHC: 31.3 g/dL (ref 30.0–36.0)
MCV: 86.1 fL (ref 80.0–100.0)
Platelets: 137 10*3/uL — ABNORMAL LOW (ref 150–400)
RBC: 3.23 MIL/uL — ABNORMAL LOW (ref 3.87–5.11)
RDW: 16.5 % — ABNORMAL HIGH (ref 11.5–15.5)
WBC: 7 10*3/uL (ref 4.0–10.5)
nRBC: 0 % (ref 0.0–0.2)

## 2021-03-17 LAB — COMPREHENSIVE METABOLIC PANEL
ALT: 14 U/L (ref 0–44)
ALT: 13 U/L (ref 0–44)
AST: 28 U/L (ref 15–41)
AST: 30 U/L (ref 15–41)
Albumin: 3.4 g/dL — ABNORMAL LOW (ref 3.5–5.0)
Albumin: 3.1 g/dL — ABNORMAL LOW (ref 3.5–5.0)
Alkaline Phosphatase: 50 U/L (ref 38–126)
Alkaline Phosphatase: 42 U/L (ref 38–126)
Anion gap: 12 (ref 5–15)
Anion gap: 11 (ref 5–15)
BUN: 15 mg/dL (ref 8–23)
BUN: 14 mg/dL (ref 8–23)
CO2: 19 mmol/L — ABNORMAL LOW (ref 22–32)
CO2: 18 mmol/L — ABNORMAL LOW (ref 22–32)
Calcium: 8.8 mg/dL — ABNORMAL LOW (ref 8.9–10.3)
Calcium: 8.5 mg/dL — ABNORMAL LOW (ref 8.9–10.3)
Chloride: 96 mmol/L — ABNORMAL LOW (ref 98–111)
Chloride: 99 mmol/L (ref 98–111)
Creatinine, Ser: 0.77 mg/dL (ref 0.44–1.00)
Creatinine, Ser: 0.82 mg/dL (ref 0.44–1.00)
GFR, Estimated: >60 mL/min (ref >60)
GFR, Estimated: >60 mL/min (ref >60)
Glucose, Bld: 189 mg/dL — ABNORMAL HIGH (ref 70–99)
Glucose, Bld: 167 mg/dL — ABNORMAL HIGH (ref 70–99)
Potassium: 3.9 mmol/L (ref 3.5–5.1)
Potassium: 3.6 mmol/L (ref 3.5–5.1)
Sodium: 127 mmol/L — ABNORMAL LOW (ref 135–145)
Sodium: 128 mmol/L — ABNORMAL LOW (ref 135–145)
Total Bilirubin: 0.7 mg/dL (ref 0.3–1.2)
Total Bilirubin: 0.5 mg/dL (ref 0.3–1.2)
Total Protein: 7.8 g/dL (ref 6.5–8.1)
Total Protein: 6.8 g/dL (ref 6.5–8.1)

## 2021-03-17 LAB — BLOOD CULTURE ID PANEL (REFLEXED) - BCID2

## 2021-03-17 LAB — TYPE AND SCREEN
ABO/RH(D): A POS
Antibody Screen: NEGATIVE

## 2021-03-17 LAB — RETICULOCYTES
Immature Retic Fract: 18.4 % — ABNORMAL HIGH (ref 2.3–15.9)
RBC.: 3.26 MIL/uL — ABNORMAL LOW (ref 3.87–5.11)
Retic Count, Absolute: 45 10*3/uL (ref 19.0–186.0)
Retic Ct Pct: 1.4 % (ref 0.4–3.1)

## 2021-03-17 LAB — RESP PANEL BY RT-PCR (FLU A&B, COVID) ARPGX2
Influenza A by PCR: NEGATIVE
Influenza B by PCR: NEGATIVE
SARS Coronavirus 2 by RT PCR: NEGATIVE

## 2021-03-17 LAB — CK: Total CK: 819 U/L — ABNORMAL HIGH (ref 38–234)

## 2021-03-17 LAB — GLUCOSE, CAPILLARY
Glucose-Capillary: 168 mg/dL — ABNORMAL HIGH (ref 70–99)
Glucose-Capillary: 143 mg/dL — ABNORMAL HIGH (ref 70–99)
Glucose-Capillary: 183 mg/dL — ABNORMAL HIGH (ref 70–99)
Glucose-Capillary: 134 mg/dL — ABNORMAL HIGH (ref 70–99)

## 2021-03-17 LAB — MAGNESIUM: Magnesium: 1.8 mg/dL (ref 1.7–2.4)

## 2021-03-17 LAB — APTT: aPTT: 35 seconds (ref 24–36)

## 2021-03-17 LAB — HIV ANTIBODY (ROUTINE TESTING W REFLEX): HIV Screen 4th Generation wRfx: NONREACTIVE

## 2021-03-17 LAB — PROCALCITONIN
Procalcitonin: 0.31 ng/mL
Procalcitonin: 0.43 ng/mL

## 2021-03-17 LAB — SODIUM, URINE, RANDOM: Sodium, Ur: 43 mmol/L

## 2021-03-17 LAB — PROTIME-INR
INR: 1.3 — ABNORMAL HIGH (ref 0.8–1.2)
Prothrombin Time: 16 seconds — ABNORMAL HIGH (ref 11.4–15.2)

## 2021-03-17 LAB — OSMOLALITY, URINE: Osmolality, Ur: 970 mOsm/kg — ABNORMAL HIGH (ref 300–900)

## 2021-03-17 LAB — LACTIC ACID, PLASMA: Lactic Acid, Venous: 1.6 mmol/L (ref 0.5–1.9)

## 2021-03-17 MED ORDER — SODIUM CHLORIDE 0.9 % IV BOLUS
1000.0000 mL | Freq: Once | INTRAVENOUS | Status: AC
  Administered 2021-03-17: 1000 mL via INTRAVENOUS

## 2021-03-17 MED ORDER — ACETAMINOPHEN 325 MG PO TABS
650.0000 mg | ORAL_TABLET | Freq: Four times a day (QID) | ORAL | Status: DC | PRN
  Administered 2021-03-17 – 2021-03-28 (×7): 650 mg via ORAL
  Filled 2021-03-17 (×7): qty 2

## 2021-03-17 MED ORDER — POLYETHYLENE GLYCOL 3350 17 G PO PACK: 17.0000 g | PACK | Freq: Every day | ORAL | Status: DC | PRN

## 2021-03-17 MED ORDER — SENNOSIDES-DOCUSATE SODIUM 8.6-50 MG PO TABS
2.0000 | ORAL_TABLET | Freq: Every day | ORAL | Status: DC
  Administered 2021-03-19 – 2021-03-26 (×4): 2 via ORAL
  Filled 2021-03-17 (×7): qty 2

## 2021-03-17 MED ORDER — SODIUM CHLORIDE 0.9 % IV SOLN: INTRAVENOUS | Status: DC

## 2021-03-17 MED ORDER — ACETAMINOPHEN 650 MG RE SUPP: 650.0000 mg | Freq: Four times a day (QID) | RECTAL | Status: DC | PRN

## 2021-03-17 MED ORDER — CEFTRIAXONE SODIUM 1 G IJ SOLR
1.0000 g | Freq: Once | INTRAMUSCULAR | Status: AC
Start: 2021-03-17 — End: 2021-03-17
  Administered 2021-03-17: 1 g via INTRAVENOUS
  Filled 2021-03-17: qty 10

## 2021-03-17 MED ORDER — INSULIN ASPART 100 UNIT/ML IJ SOLN
2.0000 [IU] | Freq: Three times a day (TID) | INTRAMUSCULAR | Status: DC
  Administered 2021-03-17 – 2021-03-29 (×25): 2 [IU] via SUBCUTANEOUS
  Filled 2021-03-17 (×29): qty 1

## 2021-03-17 MED ORDER — PANTOPRAZOLE SODIUM 40 MG PO TBEC
40.0000 mg | DELAYED_RELEASE_TABLET | Freq: Every day | ORAL | Status: DC
  Administered 2021-03-18 – 2021-03-21 (×4): 40 mg via ORAL
  Filled 2021-03-17 (×4): qty 1

## 2021-03-17 MED ORDER — HYDRALAZINE HCL 20 MG/ML IJ SOLN: 5.0000 mg | Freq: Three times a day (TID) | INTRAMUSCULAR | Status: DC | PRN

## 2021-03-17 MED ORDER — ACETAMINOPHEN 500 MG PO TABS
1000.0000 mg | ORAL_TABLET | Freq: Three times a day (TID) | ORAL | Status: DC
  Administered 2021-03-17 – 2021-03-22 (×11): 1000 mg via ORAL
  Filled 2021-03-17 (×15): qty 2

## 2021-03-17 MED ORDER — SODIUM CHLORIDE 0.9 % IV SOLN
2.0000 g | INTRAVENOUS | Status: DC
  Administered 2021-03-17 – 2021-03-21 (×4): 2 g via INTRAVENOUS
  Filled 2021-03-17 (×3): qty 20
  Filled 2021-03-17: qty 2

## 2021-03-17 MED ORDER — HEPARIN SODIUM (PORCINE) 5000 UNIT/ML IJ SOLN
5000.0000 [IU] | Freq: Three times a day (TID) | INTRAMUSCULAR | Status: DC
  Administered 2021-03-17 – 2021-03-29 (×38): 5000 [IU] via SUBCUTANEOUS
  Filled 2021-03-17 (×37): qty 1

## 2021-03-17 MED ORDER — INSULIN ASPART 100 UNIT/ML IJ SOLN
0.0000 [IU] | Freq: Three times a day (TID) | INTRAMUSCULAR | Status: DC
  Administered 2021-03-17: 2 [IU] via SUBCUTANEOUS
  Administered 2021-03-17: 1 [IU] via SUBCUTANEOUS
  Administered 2021-03-17: 2 [IU] via SUBCUTANEOUS
  Administered 2021-03-18 (×2): 1 [IU] via SUBCUTANEOUS
  Administered 2021-03-18 – 2021-03-19 (×2): 2 [IU] via SUBCUTANEOUS
  Administered 2021-03-19 – 2021-03-21 (×7): 1 [IU] via SUBCUTANEOUS
  Administered 2021-03-22 – 2021-03-25 (×9): 2 [IU] via SUBCUTANEOUS
  Administered 2021-03-25: 1 [IU] via SUBCUTANEOUS
  Administered 2021-03-25: 2 [IU] via SUBCUTANEOUS
  Administered 2021-03-26 (×2): 1 [IU] via SUBCUTANEOUS
  Administered 2021-03-26: 2 [IU] via SUBCUTANEOUS
  Administered 2021-03-27: 1 [IU] via SUBCUTANEOUS
  Administered 2021-03-27: 2 [IU] via SUBCUTANEOUS
  Administered 2021-03-27: 1 [IU] via SUBCUTANEOUS
  Administered 2021-03-28: 2 [IU] via SUBCUTANEOUS
  Administered 2021-03-28: 1 [IU] via SUBCUTANEOUS
  Administered 2021-03-28: 2 [IU] via SUBCUTANEOUS
  Administered 2021-03-29 (×3): 1 [IU] via SUBCUTANEOUS
  Filled 2021-03-17 (×37): qty 1

## 2021-03-17 MED ORDER — PANTOPRAZOLE SODIUM 40 MG IV SOLR
40.0000 mg | Freq: Two times a day (BID) | INTRAVENOUS | Status: DC
  Administered 2021-03-17 (×2): 40 mg via INTRAVENOUS
  Filled 2021-03-17 (×2): qty 10

## 2021-03-17 MED ORDER — OXYCODONE HCL 5 MG PO TABS
5.0000 mg | ORAL_TABLET | ORAL | Status: DC | PRN
  Administered 2021-03-17 – 2021-03-23 (×11): 5 mg via ORAL
  Filled 2021-03-17 (×13): qty 1

## 2021-03-17 MED ORDER — IOHEXOL 350 MG/ML SOLN
100.0000 mL | Freq: Once | INTRAVENOUS | Status: AC | PRN
  Administered 2021-03-17: 100 mL via INTRAVENOUS

## 2021-03-17 MED ORDER — ALPRAZOLAM 0.5 MG PO TABS
0.2500 mg | ORAL_TABLET | Freq: Two times a day (BID) | ORAL | Status: DC | PRN
  Administered 2021-03-22: 0.5 mg via ORAL
  Filled 2021-03-17 (×2): qty 1

## 2021-03-17 MED ORDER — AZITHROMYCIN 500 MG PO TABS
500.0000 mg | ORAL_TABLET | Freq: Every day | ORAL | Status: AC
  Administered 2021-03-17 – 2021-03-21 (×5): 500 mg via ORAL
  Filled 2021-03-17 (×5): qty 1

## 2021-03-17 MED ORDER — MEGESTROL ACETATE 20 MG PO TABS
40.0000 mg | ORAL_TABLET | Freq: Every day | ORAL | Status: DC
  Administered 2021-03-17 – 2021-03-22 (×6): 40 mg via ORAL
  Filled 2021-03-17 (×7): qty 2

## 2021-03-17 MED ORDER — CITALOPRAM HYDROBROMIDE 20 MG PO TABS
20.0000 mg | ORAL_TABLET | Freq: Every day | ORAL | Status: DC
  Administered 2021-03-17 – 2021-03-29 (×13): 20 mg via ORAL
  Filled 2021-03-17 (×13): qty 1

## 2021-03-17 NOTE — Assessment & Plan Note (Addendum)
-  Nicotine patch 

## 2021-03-17 NOTE — Hospital Course (Addendum)
71 y.o. female with medical history significant of arthritis and endometrial cancer, dm II, HTN.  Patient is receiving radiation treatment at the cancer center.  She felt extreme fatigued at home.  Could not go for radiation treatment on the day of admission.  She also mentioned cough shortness of breath.  She was found to have hyponatremia.  CT scan raised concern for pneumonia.  She was hospitalized for further management.  There was also some concern for cellulitis in her left abdominal pannus.  Started on antibiotics.  Found to have Staph epidermidis bacteremia.  Initially thought to be contaminant but repeat blood cultures also showed similar organism.  ID was consulted.  On vancomycin. ?

## 2021-03-17 NOTE — Assessment & Plan Note (Addendum)
Previously normal.  Likely due to dehydration.  ?Sodium levels low but stable.   ?Urine osmolality is 970.  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  Urine sodium was 43.   ?Continue with fluid restriction and salt tablets.  Sodium level slightly better today.  Stable for the most part.  Continue to monitor. ?

## 2021-03-17 NOTE — ED Provider Notes (Signed)
? ?[facility] ?Provider Note ? ? ? Event Date/Time  ? First MD Initiated Contact with Patient 03/16/21 2317   ?  (approximate) ? ? ?History  ? ?Weakness ? ? ?HPI ? ?LESSLIE MOSSA is a 71 y.o. female who presents to the ED for evaluation of Weakness ?  ?I reviewed outpatient oncology visit from 2/27.  Morbidly obese patient being treated for endometrial cancer.  Daily radiation. ?She lives at home with her sister and typically ambulatory with a walker. ? ?Patient presents to the ED via EMS from home for evaluation of acute on chronic generalized weakness.  She reports that for the past few days that she has been unable to get up with a walker and do anything.  She reports feeling like "being hit by a freight train."  Reports generalized symptoms without any focal weakness.  Denies falls or injuries, but does report slipping forward out of her recliner today and unable to get back up, which precipitated the sister to call for assistance. ? ?Reports 2 days of a cough and congestion without chest pain or shortness of breath.  Denies emesis, diarrhea, dysuria or increased vaginal bleeding. ? ?Physical Exam  ? ?Triage Vital Signs: ?ED Triage Vitals  ?Enc Vitals Group  ?   BP 03/16/21 2311 135/66  ?   Pulse Rate 03/16/21 2311 (!) 109  ?   Resp 03/16/21 2311 (!) 26  ?   Temp 03/16/21 2314 99.6 ?F (37.6 ?C)  ?   Temp Source 03/16/21 2314 Oral  ?   SpO2 03/16/21 2311 99 %  ?   Weight 03/16/21 2315 285 lb (129.3 kg)  ?   Height 03/16/21 2315 '5\' 7"'$  (1.702 m)  ?   Head Circumference --   ?   Peak Flow --   ?   Pain Score 03/16/21 2315 6  ?   Pain Loc --   ?   Pain Edu? --   ?   Excl. in [place]? --   ? ? ?Most recent vital signs: ?Vitals:  ? 03/16/21 2311 03/16/21 2314  ?BP: 135/66   ?Pulse: (!) 109   ?Resp: (!) 26   ?Temp:  99.6 ?F (37.6 ?C)  ?SpO2: 99%   ? ? ?General: Awake, no distress.  Obese, pleasant and conversational. ?CV:  Good peripheral perfusion.  Tachycardic and regular ?Resp:  Normal effort.   No wheezing ?Abd:  Large suprapubic ventral hernia that is soft and reducible.  Fresh gauze taped over a couple anterior abdominal wounds, that she reports are from previous "rubber diapers. " Wound to the right side of midline is well Circumscribed, 2 cm in diameter, without erythema or infectious features. ?Wound to the left of midline is larger, about 3 cm, with surrounding erythema, induration ?MSK:  No deformity noted.  ?Neuro:  No focal deficits appreciated. ?Other:   ? ? ?ED Results / Procedures / Treatments  ? ?Labs ?(all labs ordered are listed, but only abnormal results are displayed) ?Labs Reviewed  ?COMPREHENSIVE METABOLIC PANEL - Abnormal; Notable for the following components:  ?    Result Value  ? Sodium 127 (*)   ? Chloride 96 (*)   ? CO2 19 (*)   ? Glucose, Bld 189 (*)   ? Calcium 8.8 (*)   ? Albumin 3.4 (*)   ? All other components within normal limits  ?CBC WITH DIFFERENTIAL/PLATELET - Abnormal; Notable for the following components:  ? RBC 3.55 (*)   ? Hemoglobin 9.7 (*)   ?  HCT 31.1 (*)   ? RDW 16.5 (*)   ? Lymphs Abs 0.2 (*)   ? All other components within normal limits  ?PROTIME-INR - Abnormal; Notable for the following components:  ? Prothrombin Time 16.0 (*)   ? INR 1.3 (*)   ? All other components within normal limits  ?RESP PANEL BY RT-PCR (FLU A&B, COVID) [id number]  ?CULTURE, BLOOD (ROUTINE X 2)  ?URINE CULTURE  ?CULTURE, BLOOD (ROUTINE X 2) W REFLEX TO ID PANEL  ?LACTIC ACID, PLASMA  ?APTT  ?PROCALCITONIN  ?LACTIC ACID, PLASMA  ?URINALYSIS, COMPLETE (UACMP) WITH MICROSCOPIC  ?PROCALCITONIN  ? ? ?EKG ?Sinus tachycardia with a rate of 108 bpm.  Normal axis and intervals.  No evidence of acute ischemia. ? ?RADIOLOGY ?CXR reviewed by me without evidence of acute cardiopulmonary pathology. ? ?Official radiology report(s): ?DG Chest Port 1 View ? ?Result Date: 03/16/2021 ?CLINICAL DATA:  Cough, sepsis, weakness EXAM: PORTABLE CHEST 1 VIEW COMPARISON:  08/21/2005 FINDINGS: The heart size and  mediastinal contours are within normal limits. Both lungs are clear. The visualized skeletal structures are unremarkable. IMPRESSION: No active disease. Electronically Signed   By: Randa Ngo M.D.   On: 03/16/2021 23:33   ? ?PROCEDURES and INTERVENTIONS: ? ?.1-3 Lead EKG Interpretation ?Performed by: Vladimir Crofts, MD ?Authorized by: Vladimir Crofts, MD  ? ?  Interpretation: abnormal   ?  ECG rate:  110 ?  ECG rate assessment: tachycardic   ?  Rhythm: sinus tachycardia   ?  Ectopy: none   ?  Conduction: normal   ?.Critical Care ?Performed by: Vladimir Crofts, MD ?Authorized by: Vladimir Crofts, MD  ? ?Critical care provider statement:  ?  Critical care time (minutes):  30 ?  Critical care time was exclusive of:  Separately billable procedures and treating other patients ?  Critical care was necessary to treat or prevent imminent or life-threatening deterioration of the following conditions:  Sepsis ?  Critical care was time spent personally by me on the following activities:  Development of treatment plan with patient or surrogate, discussions with consultants, evaluation of patient's response to treatment, examination of patient, ordering and review of laboratory studies, ordering and review of radiographic studies, ordering and performing treatments and interventions, pulse oximetry, re-evaluation of patient's condition and review of old charts ? ?Medications  ?cefTRIAXone (ROCEPHIN) 1 g in sodium chloride 0.9 % 100 mL IVPB (has no administration in time range)  ?sodium chloride 0.9 % bolus 1,000 mL (1,000 mLs Intravenous New Bag/Given 03/17/21 0021)  ? ? ? ?IMPRESSION / MDM / ASSESSMENT AND PLAN / ED COURSE  ?I reviewed the triage vital signs and the nursing notes. ? ?71 year old female being treated for endometrial cancer with radiation, presents to the ED with generalized weakness, found to have evidence of hyponatremia, dehydration and meeting sepsis criteria with abdominal wall cellulitis.  She is tachycardic and  tachypneic, but remains hemodynamically stable without fever or hypoxia.  She looks weak with a nonfocal neurologic examination.  She has chronic wounds to her abdominal wall, right side looks clean with good wound bed into the dermis, but left side has cellulitic changes.  Blood work without leukocytosis or significant hemoglobin drop, but her metabolic panel does demonstrate new hyponatremia and metabolic acidosis concerning for dehydration.  She has no lactic acidosis, but her procalcitonin is slightly elevated.  In conjunction with her vital sign derangements, abdominal wall cellulitis, she has been sepsis criteria and cultures were drawn for this.  She was provided Rocephin  here in the ED.  CXR is clear considering her couple days of congestion and cough.  Less likely PE considering her lack of chest pain or shortness of breath.  We will consult with medicine for admission. ? ?Clinical Course as of 03/17/21 0042  ?Thu Mar 17, 2021  ?0034 reassessed [DS]  ?  ?Clinical Course User Index ?[DS] Vladimir Crofts, MD  ? ? ? ?FINAL CLINICAL IMPRESSION(S) / ED DIAGNOSES  ? ?Final diagnoses:  ?Generalized weakness  ?Hyponatremia  ?Cellulitis of abdominal wall  ? ? ? ?Rx / DC Orders  ? ?ED Discharge Orders   ? ? None  ? ?  ? ? ? ?Note:  This document was prepared using Dragon voice recognition software and may include unintentional dictation errors. ?  ?Vladimir Crofts, MD ?03/17/21 0044 ? ?

## 2021-03-17 NOTE — Telephone Encounter (Signed)
MMR results have been obtained. Notified Dr. Fransisca Connors that not enough normal tissue was available for MSI testing. At this time he states MMR is adequate and we do not need to obtain MSI. ?

## 2021-03-17 NOTE — Assessment & Plan Note (Addendum)
Anemia likely multifactorial.  Previously has had acute on chronic blood loss anemia from vaginal bleeding.  She has required blood transfusion. ?Now there is some element of anemia of chronic disease.  Hemoglobin low but stable.  Continue to monitor.  ?

## 2021-03-17 NOTE — Progress Notes (Signed)
? ?[facility] ?PROGRESS NOTE ? ? ?Selena Rodgers VOH:[id number] DOB: [birth year] DOA: 03/16/2021  0 ?DOS: the patient was seen and examined on 03/17/2021 ? ?PCP: [name], MD ? ?Brief History and Hospital Course:  ?71 y.o. female with medical history significant of arthritis and endometrial cancer, dm II, HTN.  Patient is currently receiving radiation treatment at the cancer center.  She felt extreme fatigue over the last 48 hours.  Could not go for radiation treatment on the day of admission.  She also mention cough shortness of breath.  She was found to have hyponatremia.  CT scan raised concern for pneumonia.  She was hospitalized for further management.  There was also some concern for cellulitis in her left abdominal pannus. ? ?Consultants: GYN oncology will be notified. ? ?Procedures: None yet ? ? ? ?Subjective: ?Complains of feeling fatigued but denies any chest pain.  Occasional cough.  No nausea vomiting.  Denies any vaginal bleeding at this time. ? ? ? ?Assessment/Plan: ? ? ?* Generalized weakness ?Likely due to dehydration, pneumonia, anemia.  Vital signs are stable.  Lactic acid level was normal.  CK was noted to be elevated at 819.  Continue to monitor.  ? ?CAP (community acquired pneumonia) ?Patient presented with generalized weakness cough shortness of breath.  Chest x-ray did not show any infiltrate but CT scan does raise concern for pneumonia.  Patient is symptomatic.  She does not have any oxygen requirement at this time.  Procalcitonin is minimally elevated though WBC is normal.  She was noted to be mildly tachycardic but afebrile.  No evidence for sepsis currently. ?She was given a dose of ceftriaxone yesterday.  We will continue for now and will add azithromycin.   ? ?Cellulitis of abdominal wall ?Patient has maceration of the skin over the left abdomen/pannus.  There is evidence for erythema suggesting cellulitis.  No fluctuation is noted.  Will request wound care to  evaluate. ?We will continue ceftriaxone.  ? ?Endometrial cancer ([place]) ?Currently getting radiation treatment.  Supposed to end March 29.  She missed her treatment yesterday.  Will and oncology to the treatment team. ? ? ?Hyponatremia ?Previously normal.  Likely due to dehydration.  Continue IV fluids.  We will check urine studies.   ? ?Diabetes ([place]) ?Monitor CBGs.  Continue SSI. ? ? ?Anemia ?Due to vaginal bleeding from her cancer patient has required blood transfusions previously.  Hemoglobin has dropped some but still greater than 8.  Continue to monitor.   ? ?GERD (gastroesophageal reflux disease) ?Continue PPI. ? ?Current tobacco use ?Nicotine patch. ? ? ? ?Morbid obesity ?Estimated body mass index is 44.64 kg/m? as calculated from the following: ?  Height as of this encounter: '5\' 7"'$  (1.702 m). ?  Weight as of this encounter: 129.3 kg. ? ? ?  ?DVT Prophylaxis: Subcutaneous heparin ?Code Status: Full code ?Family Communication: Discussed with patient.  No family at bedside ?Disposition Plan: To be determined. ? ?Status is: Observation ?The patient will require care spanning > 2 midnights and should be moved to inpatient because: Abdominal wall cellulitis, pneumonia, history of endometrial cancer ? ? ? ? ?Medications: Scheduled: ? acetaminophen  1,000 mg Oral TID  ? citalopram  20 mg Oral Daily  ? heparin  5,000 Units Subcutaneous Q8H  ? insulin aspart  0-9 Units Subcutaneous TID WC  ? insulin aspart  2 Units Subcutaneous TID WC  ? megestrol  40 mg Oral Daily  ? pantoprazole (PROTONIX) IV  40 mg Intravenous  Q12H  ? ?Continuous: ? sodium chloride 50 mL/hr at 03/17/21 0556  ? ?UVO:[id number] **OR** acetaminophen, ALPRAZolam, hydrALAZINE ? ?Antibiotics: ?Anti-infectives (From admission, onward)  ? ? Start     Dose/Rate Route Frequency Ordered Stop  ? 03/17/21 0045  cefTRIAXone (ROCEPHIN) 1 g in sodium chloride 0.9 % 100 mL IVPB       ? 1 g ?200 mL/hr over 30 Minutes Intravenous  Once 03/17/21 0030 03/17/21 0115   ? ?  ? ? ?Objective: ? ?Vital Signs ? ?Vitals:  ? 03/17/21 0230 03/17/21 9563 03/17/21 0555 03/17/21 0842  ?BP: (!) 128/58 (!) 127/59 (!) 147/66 (!) 133/40  ?Pulse: (!) 103 98 (!) 104 84  ?Resp: (!) '28 18 20 16  '$ ?Temp:   98 ?F (36.7 ?C) 97.8 ?F (36.6 ?C)  ?TempSrc:      ?SpO2: 97% 97% 96% 97%  ?Weight:      ?Height:      ? ?No intake or output data in the 24 hours ending 03/17/21 0927 ?Filed Weights  ? 03/16/21 2315  ?Weight: 129.3 kg  ? ? ?General appearance: Awake alert.  In no distress ?Resp: Normal effort at rest.  Diminished air entry at the bases with a few crackles. ?Cardio: S1-S2 is normal regular.  No S3-S4.  No rubs murmurs or bruit ?GI: Abdomen is soft.  Skin is macerated with evidence for erythema in the left lower abdomen/pannus. ?Extremities: No edema.  Generalized weakness noted ?Neurologic: Alert and oriented x3.  No focal neurological deficits.  ? ? ?Lab Results: ? ?Data Reviewed: I have personally reviewed labs and imaging study reports ? ?CBC: ?Recent Labs  ?Lab 03/16/21 ?2329 03/17/21 ?0416  ?WBC 8.4 7.0  ?NEUTROABS 7.6  --   ?HGB 9.7* 8.7*  ?HCT 31.1* 27.8*  ?MCV 87.6 86.1  ?PLT 153 137*  ? ? ?Basic Metabolic Panel: ?Recent Labs  ?Lab 03/16/21 ?2329 03/17/21 ?0416  ?NA 127* 128*  ?K 3.9 3.6  ?CL 96* 99  ?CO2 19* 18*  ?GLUCOSE 189* 167*  ?BUN 15 14  ?CREATININE 0.77 0.82  ?CALCIUM 8.8* 8.5*  ?MG 1.8  --   ? ? ?GFR: ?Estimated Creatinine Clearance: 91.9 mL/min (by C-G formula based on SCr of 0.82 mg/dL). ? ?Liver Function Tests: ?Recent Labs  ?Lab 03/16/21 ?2329 03/17/21 ?0416  ?AST 28 30  ?ALT 14 13  ?ALKPHOS 50 42  ?BILITOT 0.7 0.5  ?PROT 7.8 6.8  ?ALBUMIN 3.4* 3.1*  ? ? ? ?Coagulation Profile: ?Recent Labs  ?Lab 03/16/21 ?2329  ?INR 1.3*  ? ? ?Cardiac Enzymes: ?Recent Labs  ?Lab 03/16/21 ?2329  ?CKTOTAL 819*  ? ? ? ?CBG: ?Recent Labs  ?Lab 03/17/21 ?0841  ?GLUCAP 168*  ? ? ? ?Anemia Panel: ?Recent Labs  ?  03/17/21 ?0416  ?RETICCTPCT 1.4  ? ? ?Recent Results (from the past 240 hour(s))  ?Blood  Culture (routine x 2)     Status: None (Preliminary result)  ? Collection Time: 03/16/21 11:29 PM  ? Specimen: BLOOD  ?Result Value Ref Range Status  ? Specimen Description BLOOD RIGHT ASSIST CONTROL  Final  ? Special Requests   Final  ?  BOTTLES DRAWN AEROBIC AND ANAEROBIC Blood Culture adequate volume  ? Culture   Final  ?  NO GROWTH < 12 HOURS ?Performed at [facility], [address] [place], [place] [phone] ?  ? Report Status PENDING  Incomplete  ?Resp Panel by RT-PCR (Flu A&B, Covid) Nasopharyngeal Swab     Status: None  ?  Collection Time: 03/16/21 11:32 PM  ? Specimen: Nasopharyngeal Swab; Nasopharyngeal(NP) swabs in vial transport medium  ?Result Value Ref Range Status  ? SARS Coronavirus 2 by RT PCR NEGATIVE NEGATIVE Final  ?  Comment: (NOTE) ?SARS-CoV-2 target nucleic acids are NOT DETECTED. ? ?The SARS-CoV-2 RNA is generally detectable in upper respiratory ?specimens during the acute phase of infection. The lowest ?concentration of SARS-CoV-2 viral copies this assay can detect is ?138 copies/mL. A negative result does not preclude SARS-Cov-2 ?infection and should not be used as the sole basis for treatment or ?other patient management decisions. A negative result may occur with  ?improper specimen collection/handling, submission of specimen other ?than nasopharyngeal swab, presence of viral mutation(s) within the ?areas targeted by this assay, and inadequate number of viral ?copies(<138 copies/mL). A negative result must be combined with ?clinical observations, patient history, and epidemiological ?information. The expected result is Negative. ? ?Fact Sheet for Patients:  ?EntrepreneurPulse.com.au ? ?Fact Sheet for Healthcare Providers:  ?IncredibleEmployment.be ? ?This test is no t yet approved or cleared by the [place] FDA and  ?has been authorized for detection and/or diagnosis of SARS-CoV-2 by ?FDA under an Emergency Use Authorization (EUA). This  EUA will remain  ?in effect (meaning this test can be used) for the duration of the ?COVID-19 declaration under Section 564(b)(1) of the Act, 21 ?U.S.C.section 360bbb-3(b)(1), unless the authorization is terminat

## 2021-03-17 NOTE — Progress Notes (Signed)
requested SCD's from portable equitment for placement on patient upon arrival to unit, no SCD's available at this time. ?

## 2021-03-17 NOTE — Assessment & Plan Note (Deleted)
We will obtain cta due to risk of PE . ?Pt is 97 at rest and sounds abnormal  ?

## 2021-03-17 NOTE — Progress Notes (Signed)
PHARMACY - PHYSICIAN COMMUNICATION ?CRITICAL VALUE ALERT - BLOOD CULTURE IDENTIFICATION (BCID) ? ?Selena Rodgers is an 71 y.o. female w/ h/o  arthritis and endometrial cancer (currently on Radiation therapy), T2DM, & HTN who presented to [facility] on 03/16/2021 with a chief complaint of extreme fatigue >> c/f CAP + Cellulitis of abdominal wall.  ? ?Assessment:  BCID (reflexed from the 03/16/21 2329) resulted  with Staphylococcus spp>> Staph Epidermidis (Mec A/C resistance detected). Pt is currently on Ceftriaxone & Azithromycin, afebrile, no leukocytosis, but uptrending procal. This could still represent a contaminant and pt has a repeat set of blood cultures from 3/16 0637 pending. ? ?Name of physician (or Provider) Contacted: Velia Meyer NP ? ?Current antibiotics: Ceftriaxone & azithromycin (CAP/Cellulitis) ? ?Changes to prescribed antibiotics recommended:  ?-- Plan to follow up growth on the AM set of blood cultures and continue with CTX/Azith for now.  ?-- Pending results in repeated set can consider Vancomycin if worsening or concern for true bacteremia. ? ?Results for orders placed or performed during the hospital encounter of 03/16/21  ?Blood Culture ID Panel (Reflexed) (Collected: 03/16/2021 11:29 PM)  ?Result Value Ref Range  ? Enterococcus faecalis NOT DETECTED NOT DETECTED  ? Enterococcus Faecium NOT DETECTED NOT DETECTED  ? Listeria monocytogenes NOT DETECTED NOT DETECTED  ? Staphylococcus species DETECTED (A) NOT DETECTED  ? Staphylococcus aureus (BCID) NOT DETECTED NOT DETECTED  ? Staphylococcus epidermidis DETECTED (A) NOT DETECTED  ? Staphylococcus lugdunensis NOT DETECTED NOT DETECTED  ? Streptococcus species NOT DETECTED NOT DETECTED  ? Streptococcus agalactiae NOT DETECTED NOT DETECTED  ? Streptococcus pneumoniae NOT DETECTED NOT DETECTED  ? Streptococcus pyogenes NOT DETECTED NOT DETECTED  ? A.calcoaceticus-baumannii NOT DETECTED NOT DETECTED  ? Bacteroides fragilis NOT DETECTED NOT  DETECTED  ? Enterobacterales NOT DETECTED NOT DETECTED  ? Enterobacter cloacae complex NOT DETECTED NOT DETECTED  ? Escherichia coli NOT DETECTED NOT DETECTED  ? Klebsiella aerogenes NOT DETECTED NOT DETECTED  ? Klebsiella oxytoca NOT DETECTED NOT DETECTED  ? Klebsiella pneumoniae NOT DETECTED NOT DETECTED  ? Proteus species NOT DETECTED NOT DETECTED  ? Salmonella species NOT DETECTED NOT DETECTED  ? Serratia marcescens NOT DETECTED NOT DETECTED  ? Haemophilus influenzae NOT DETECTED NOT DETECTED  ? Neisseria meningitidis NOT DETECTED NOT DETECTED  ? Pseudomonas aeruginosa NOT DETECTED NOT DETECTED  ? Stenotrophomonas maltophilia NOT DETECTED NOT DETECTED  ? Candida albicans NOT DETECTED NOT DETECTED  ? Candida auris NOT DETECTED NOT DETECTED  ? Candida glabrata NOT DETECTED NOT DETECTED  ? Candida krusei NOT DETECTED NOT DETECTED  ? Candida parapsilosis NOT DETECTED NOT DETECTED  ? Candida tropicalis NOT DETECTED NOT DETECTED  ? Cryptococcus neoformans/gattii NOT DETECTED NOT DETECTED  ? Methicillin resistance mecA/C DETECTED (A) NOT DETECTED  ? ? ?Shanon Brow Clotile Whittington ?03/17/2021  10:10 PM ? ?

## 2021-03-17 NOTE — Assessment & Plan Note (Addendum)
Monitor CBGs.  Continue SSI. ? ?

## 2021-03-17 NOTE — Assessment & Plan Note (Addendum)
Hypokalemia ? ?Likely due to dehydration, pneumonia, anemia.  Vital signs are stable.  Lactic acid level was normal.  CK was noted to be elevated at 819, improved to 709.  ?Needs a skilled nursing facility for rehabilitation per physical therapy. ?Continue to replete potassium. ?

## 2021-03-17 NOTE — H&P (Signed)
?History and Physical  ? ? ?Patient: Selena Rodgers LHT:[id number] DOB: [birth year] ?DOA: 03/16/2021 ?DOS: the patient was seen and examined on 03/17/2021 ?PCP: [name], MD  ?Patient coming from: Home ? ?Chief Complaint:  ?Chief Complaint  ?Patient presents with  ? Weakness  ? ?HPI: Selena Rodgers is a 71 y.o. female with medical history significant of arthritis and endometrial cancer, dm II, HTN.  ?Pt went to radiation therapy and the next am could not get out of bed or stand no her legs . ?ROS is completely negative otherwise.  ? ?Review of Systems: Review of Systems  ?Constitutional:  Positive for malaise/fatigue and weight loss.  ?Respiratory:  Positive for shortness of breath.   ?Neurological:  Positive for weakness.  ? ?Past Medical History:  ?Diagnosis Date  ? Anxiety   ? Arthritis   ? Chronic back pain   ? Diabetes ([place]) 03/17/2021  ? Endometrial cancer ([place]) 02/09/2021  ? GERD (gastroesophageal reflux disease)   ? Hypertension   ? Post-menopausal bleeding   ? Spinal stenosis   ? Umbilical hernia   ? Uterine cancer ([place])   ? Ventral hernia   ? ?Past Surgical History:  ?Procedure Laterality Date  ? ANTERIOR CERVICAL DECOMP/DISCECTOMY FUSION N/A 04/19/2015  ? Procedure: ANTERIOR CERVICAL DECOMPRESSION/DISCECTOMY FUSION INTERBODY PROTHESIS PLATING BONEGRAFT CERVICAL THREE-FOUR ,CERVICAL FOUR-FIVE;  Surgeon: Newman Pies, MD;  Location: [facility];  Service: Neurosurgery;  Laterality: N/A;  ? BREAST BIOPSY    ? CARPAL TUNNEL RELEASE    ? CHOLECYSTECTOMY    ? FOOT SURGERY Right   ? IRRIGATION AND DEBRIDEMENT ABDOMEN  01/10/2014  ? Dr. Marina Gravel  ? UMBILICAL HERNIA REPAIR    ? VENTRAL HERNIA REPAIR  12/26/2013  ? Dr. Pat Patrick  ? WOUND EXPLORATION N/A 07/14/2019  ? Procedure: LUMBAR EXPLORATION EVACUATION OF HEMATOMA;  Surgeon: Newman Pies, MD;  Location: [facility];  Service: Neurosurgery;  Laterality: N/A;  ? ?Social History:  reports that she has been smoking. She has never used smokeless tobacco. She reports  that she does not drink alcohol and does not use drugs. ? ?Allergies  ?Allergen Reactions  ? Chlorpheniramine Anaphylaxis  ? Linseed Oil Anaphylaxis  ? Codeine Hives  ? Flax Seed Oil [Bio-Flax] Swelling  ?  UNSPECIFIED   ? Latex Hives and Other (See Comments)  ?  BLISTERS  ? Tape Other (See Comments)  ?  BLISTERS  ? Tapentadol Other (See Comments)  ?  BLISTERS  ? Rosemary Oil Swelling  ?  Lip swelling  ? 2,4-D Dimethylamine Rash  ?  T-DAP  ? Aspirin Rash  ? ? ?Family History  ?Problem Relation Age of Onset  ? Diabetes Maternal Grandmother   ? Diabetes Maternal Grandfather   ? Diabetes Paternal Grandmother   ? Diabetes Paternal Grandfather   ? Hypertension Mother   ? ? ?Prior to Admission medications   ?Medication Sig Start Date End Date Taking? Authorizing Provider  ?acetaminophen (TYLENOL) 500 MG tablet Take 2 tablets (1,000 mg total) by mouth 3 (three) times daily. 07/25/19  Yes Dwyane Dee, MD  ?ALPRAZolam Duanne Moron) 0.5 MG tablet Take 0.5-1 tablets (0.25-0.5 mg total) by mouth 2 (two) times daily as needed for anxiety. 02/24/21  Yes Verlon Au, NP  ?citalopram (CELEXA) 20 MG tablet Take 20 mg by mouth daily. 03/26/20  Yes [provider]  ?megestrol (MEGACE) 40 MG tablet Take 40 mg by mouth daily. 01/25/21  Yes [provider]  ?meloxicam (MOBIC) 15 MG tablet Take  15 mg by mouth daily. 03/28/19  Yes [provider]  ?metFORMIN (GLUCOPHAGE) 500 MG tablet Take 500 mg by mouth 2 (two) times daily. 12/22/20  Yes [provider]  ?omeprazole (PRILOSEC) 40 MG capsule Take 40 mg by mouth daily.    Yes [provider]  ?vitamin B-12 (CYANOCOBALAMIN) 1000 MCG tablet Take 1,000 mcg by mouth daily.   Yes [provider]  ? ? ?Physical Exam: ?Vitals:  ? 03/17/21 0000 03/17/21 0109 03/17/21 0130 03/17/21 0200  ?BP: (!) 140/44 (!) 166/62 (!) 152/48 139/61  ?Pulse: (!) 109 (!) 104 (!) 103 (!) 101  ?Resp: (!) 22 (!) 29 (!) 32 (!) 30  ?Temp:      ?TempSrc:      ?SpO2: 99% 98%  97% 98%  ?Weight:      ?Height:      ?Physical Exam ?Vitals and nursing note reviewed.  ?Constitutional:   ?   General: She is not in acute distress. ?   Appearance: Normal appearance. She is not ill-appearing, toxic-appearing or diaphoretic.  ?HENT:  ?   Head: Normocephalic and atraumatic.  ?   Right Ear: Hearing and external ear normal.  ?   Left Ear: Hearing and external ear normal.  ?   Nose: Nose normal. No nasal deformity.  ?   Mouth/Throat:  ?   Lips: Pink.  ?   Mouth: Mucous membranes are moist.  ?   Tongue: No lesions.  ?   Pharynx: Oropharynx is clear.  ?Eyes:  ?   Extraocular Movements: Extraocular movements intact.  ?   Pupils: Pupils are equal, round, and reactive to light.  ?Neck:  ?   Vascular: No carotid bruit.  ?Cardiovascular:  ?   Rate and Rhythm: Normal rate and regular rhythm.  ?   Pulses: Normal pulses.  ?   Heart sounds: Normal heart sounds.  ?Pulmonary:  ?   Effort: Pulmonary effort is normal.  ?   Breath sounds: Wheezing and rhonchi present.  ?Abdominal:  ?   General: Bowel sounds are normal. There is no distension.  ?   Palpations: Abdomen is soft. There is no mass.  ?   Tenderness: There is no abdominal tenderness. There is no guarding.  ?   Hernia: No hernia is present.  ?Musculoskeletal:  ?   Right lower leg: No edema.  ?   Left lower leg: No edema.  ?Skin: ?   General: Skin is warm.  ?Neurological:  ?   General: No focal deficit present.  ?   Mental Status: She is alert and oriented to person, place, and time.  ?   Cranial Nerves: Cranial nerves 2-12 are intact.  ?   Motor: Motor function is intact.  ?Psychiatric:     ?   Attention and Perception: Attention normal.     ?   Mood and Affect: Mood normal.     ?   Speech: Speech normal.     ?   Behavior: Behavior normal. Behavior is cooperative.     ?   Cognition and Memory: Cognition normal.  ? ? ?Data Reviewed: ?Results for orders placed or performed during the hospital encounter of 03/16/21 (from the past 24 hour(s))  ?Lactic acid,  plasma     Status: None  ? Collection Time: 03/16/21 11:29 PM  ?Result Value Ref Range  ? Lactic Acid, Venous 1.6 0.5 - 1.9 mmol/L  ?Comprehensive metabolic panel     Status: Abnormal  ? Collection Time: 03/16/21  11:29 PM  ?Result Value Ref Range  ? Sodium 127 (L) 135 - 145 mmol/L  ? Potassium 3.9 3.5 - 5.1 mmol/L  ? Chloride 96 (L) 98 - 111 mmol/L  ? CO2 19 (L) 22 - 32 mmol/L  ? Glucose, Bld 189 (H) 70 - 99 mg/dL  ? BUN 15 8 - 23 mg/dL  ? Creatinine, Ser 0.77 0.44 - 1.00 mg/dL  ? Calcium 8.8 (L) 8.9 - 10.3 mg/dL  ? Total Protein 7.8 6.5 - 8.1 g/dL  ? Albumin 3.4 (L) 3.5 - 5.0 g/dL  ? AST 28 15 - 41 U/L  ? ALT 14 0 - 44 U/L  ? Alkaline Phosphatase 50 38 - 126 U/L  ? Total Bilirubin 0.7 0.3 - 1.2 mg/dL  ? GFR, Estimated >60 >60 mL/min  ? Anion gap 12 5 - 15  ?CBC WITH DIFFERENTIAL     Status: Abnormal  ? Collection Time: 03/16/21 11:29 PM  ?Result Value Ref Range  ? WBC 8.4 4.0 - 10.5 K/uL  ? RBC 3.55 (L) 3.87 - 5.11 MIL/uL  ? Hemoglobin 9.7 (L) 12.0 - 15.0 g/dL  ? HCT 31.1 (L) 36.0 - 46.0 %  ? MCV 87.6 80.0 - 100.0 fL  ? MCH 27.3 26.0 - 34.0 pg  ? MCHC 31.2 30.0 - 36.0 g/dL  ? RDW 16.5 (H) 11.5 - 15.5 %  ? Platelets 153 150 - 400 K/uL  ? nRBC 0.0 0.0 - 0.2 %  ? Neutrophils Relative % 90 %  ? Neutro Abs 7.6 1.7 - 7.7 K/uL  ? Lymphocytes Relative 3 %  ? Lymphs Abs 0.2 (L) 0.7 - 4.0 K/uL  ? Monocytes Relative 5 %  ? Monocytes Absolute 0.5 0.1 - 1.0 K/uL  ? Eosinophils Relative 0 %  ? Eosinophils Absolute 0.0 0.0 - 0.5 K/uL  ? Basophils Relative 1 %  ? Basophils Absolute 0.0 0.0 - 0.1 K/uL  ? Immature Granulocytes 1 %  ? Abs Immature Granulocytes 0.07 0.00 - 0.07 K/uL  ?Protime-INR     Status: Abnormal  ? Collection Time: 03/16/21 11:29 PM  ?Result Value Ref Range  ? Prothrombin Time 16.0 (H) 11.4 - 15.2 seconds  ? INR 1.3 (H) 0.8 - 1.2  ?APTT     Status: None  ? Collection Time: 03/16/21 11:29 PM  ?Result Value Ref Range  ? aPTT 35 24 - 36 seconds  ?Procalcitonin - Baseline     Status: None  ? Collection Time: 03/16/21  11:29 PM  ?Result Value Ref Range  ? Procalcitonin 0.31 ng/mL  ?Resp Panel by RT-PCR (Flu A&B, Covid) Nasopharyngeal Swab     Status: None  ? Collection Time: 03/16/21 11:32 PM  ? Specimen: Nasopharyngeal

## 2021-03-17 NOTE — Progress Notes (Signed)
PT Cancellation Note ? ?Patient Details ?Name: MALENY CANDY ?MRN: [id number] ?DOB: 03-27-1950 ? ? ?Cancelled Treatment:    Reason Eval/Treat Not Completed: Other (comment).  PT consult received.  Chart reviewed.  Pt currently getting IV placed (pt not available for PT session).  Will re-attempt PT evaluation at a later date/time. ? ?Leitha Bleak, PT ?03/17/21, 3:35 PM ? ?

## 2021-03-17 NOTE — Assessment & Plan Note (Addendum)
Has been receiving radiation treatment.  Supposed to end March 29.  ?Oncology was notified of admission.  Radiation treatments on hold.  ?Patient mentioned that she passed 2 small clots vaginally 2 days ago.  Hemoglobin stable. ?Noted to be on megestrol. ?

## 2021-03-17 NOTE — Assessment & Plan Note (Addendum)
Patient has maceration of the skin over the left abdomen/pannus.  Erythema was noted suggesting cellulitis.  No clear abscess. ?Seen by wound care.  Wound care dressing changes to be performed. ?Continue vancomycin.  Erythema appears to have improved.  Continue local wound care. ?

## 2021-03-17 NOTE — Consult Note (Signed)
[facility] Nurse Consult Note: ?Reason for Consult: abdominal wounds ?Patient and I discussed large pendulous hernia of the LLQ of the abdomen. She had a hernia repair in 2015 but it was unsuccessful; she has had multiple surgeries and set back medically since.   ?Wound type: ?Sheer/fiction: trauma from incontinence briefs. Patient self reports use of briefs when she began daily radiation therapy and that wounds began with the use of briefs ?MDRPI (medical device related pressure injury) sustained pressure to the area from briefs ?MASD (Moisture associated skin damage); open draining wound are increasing moisture in the area and the left groin.  ?Pressure Injury POA: Yes ?Measurement:6 scattered partial thickness areas of skin loss over the dependent areas of the hernia; more chronic looking round lesion on the dorsal surface of the hernia 2cm x 2cm x 0.2cm  ?Wound GGY:IRSW areas are clean and pink, some yellow fibrinous material over some of the dependent lesions  ?Drainage (amount, consistency, odor) serosanguinous; non purulent   ?Periwound: large pendulous hernia  ?Dressing procedure/placement/frequency: ?Add low air loss mattress for moisture management ?Add silver hydrofiber to open wounds for exudate management and antimicrobial coverage ?Top with silicone foam for protection and additional absorption.  ?Antimicrobial wicking fabric to the left and right groin to wick moisture from the area and to treat fungal overgrowth that is present.  ?DO NOT USE BRIEFS ON THIS PATIENT ?  ? ?I have suggested the patient not wear briefs, the challenge is she has outpatient radiation until 03/30/21 and she maintains her hygiene with briefs to allow for treatment  ? ? ?Discussed POC with patient and bedside nurse.  ?Re consult if needed, will not follow at this time. ?Thanks ? Juwana Thoreson [facility] MSN, RN,CWOCN, CNS, CWON-AP [phone])  ? ? ?  ?

## 2021-03-17 NOTE — Assessment & Plan Note (Addendum)
Vomiting ? ?Patient complains of vomiting this morning.  Denies any abdominal pain.  Does have acid reflux.  Given antiemetics.  Increase dose of PPI.  Monitor for now.  Abdomen remains benign.  Recently done CT scan did not show any obstruction. ?

## 2021-03-17 NOTE — Assessment & Plan Note (Addendum)
Patient presented with generalized weakness cough shortness of breath.  Chest x-ray did not show any infiltrate but CT scan did raise concern for pneumonia.  ?Required oxygen briefly. ?Remains afebrile.  WBC is normal. ?Ceftriaxone and azithromycin for 5 days.  Respiratory status has improved.  Occasional cough.  Saturating normal on room air. ?

## 2021-03-18 ENCOUNTER — Encounter: Payer: Self-pay | Admitting: Nurse Practitioner

## 2021-03-18 ENCOUNTER — Ambulatory Visit: Payer: Medicare HMO

## 2021-03-18 DIAGNOSIS — L03311 Cellulitis of abdominal wall: Secondary | ICD-10-CM | POA: Diagnosis not present

## 2021-03-18 DIAGNOSIS — J189 Pneumonia, unspecified organism: Secondary | ICD-10-CM | POA: Diagnosis not present

## 2021-03-18 DIAGNOSIS — D649 Anemia, unspecified: Secondary | ICD-10-CM | POA: Diagnosis not present

## 2021-03-18 DIAGNOSIS — R7881 Bacteremia: Secondary | ICD-10-CM

## 2021-03-18 LAB — CBC
HCT: 28.4 % — ABNORMAL LOW (ref 36.0–46.0)
Hemoglobin: 9 g/dL — ABNORMAL LOW (ref 12.0–15.0)
MCH: 27.4 pg (ref 26.0–34.0)
MCHC: 31.7 g/dL (ref 30.0–36.0)
MCV: 86.3 fL (ref 80.0–100.0)
Platelets: 125 10*3/uL — ABNORMAL LOW (ref 150–400)
RBC: 3.29 MIL/uL — ABNORMAL LOW (ref 3.87–5.11)
RDW: 16.7 % — ABNORMAL HIGH (ref 11.5–15.5)
WBC: 5.9 10*3/uL (ref 4.0–10.5)
nRBC: 0 % (ref 0.0–0.2)

## 2021-03-18 LAB — BASIC METABOLIC PANEL
Anion gap: 11 (ref 5–15)
BUN: 15 mg/dL (ref 8–23)
CO2: 20 mmol/L — ABNORMAL LOW (ref 22–32)
Calcium: 8.7 mg/dL — ABNORMAL LOW (ref 8.9–10.3)
Chloride: 99 mmol/L (ref 98–111)
Creatinine, Ser: 0.75 mg/dL (ref 0.44–1.00)
GFR, Estimated: >60 mL/min (ref >60)
Glucose, Bld: 145 mg/dL — ABNORMAL HIGH (ref 70–99)
Potassium: 3.2 mmol/L — ABNORMAL LOW (ref 3.5–5.1)
Sodium: 130 mmol/L — ABNORMAL LOW (ref 135–145)

## 2021-03-18 LAB — BLOOD CULTURE ID PANEL (REFLEXED) - BCID2

## 2021-03-18 LAB — GLUCOSE, CAPILLARY
Glucose-Capillary: 153 mg/dL — ABNORMAL HIGH (ref 70–99)
Glucose-Capillary: 137 mg/dL — ABNORMAL HIGH (ref 70–99)
Glucose-Capillary: 126 mg/dL — ABNORMAL HIGH (ref 70–99)
Glucose-Capillary: 175 mg/dL — ABNORMAL HIGH (ref 70–99)

## 2021-03-18 LAB — HEMOGLOBIN A1C
Hgb A1c MFr Bld: 5.8 % — ABNORMAL HIGH (ref 4.8–5.6)
Mean Plasma Glucose: 120 mg/dL

## 2021-03-18 LAB — URINE CULTURE: Culture: NO GROWTH

## 2021-03-18 LAB — MRSA NEXT GEN BY PCR, NASAL: MRSA by PCR Next Gen: POSITIVE — AB

## 2021-03-18 LAB — MAGNESIUM: Magnesium: 2.1 mg/dL (ref 1.7–2.4)

## 2021-03-18 LAB — PROCALCITONIN: Procalcitonin: 0.46 ng/mL

## 2021-03-18 MED ORDER — MUPIROCIN 2 % EX OINT
1.0000 "application " | TOPICAL_OINTMENT | Freq: Two times a day (BID) | CUTANEOUS | Status: AC
  Administered 2021-03-19 – 2021-03-23 (×10): 1 via NASAL
  Filled 2021-03-18: qty 22

## 2021-03-18 MED ORDER — SODIUM CHLORIDE 0.9 % IV SOLN: INTRAVENOUS | Status: DC

## 2021-03-18 MED ORDER — MENTHOL 3 MG MT LOZG
1.0000 | LOZENGE | OROMUCOSAL | Status: DC | PRN
  Filled 2021-03-18: qty 9

## 2021-03-18 MED ORDER — ALUM & MAG HYDROXIDE-SIMETH 200-200-20 MG/5ML PO SUSP
15.0000 mL | ORAL | Status: DC | PRN
  Administered 2021-03-18 (×2): 15 mL via ORAL
  Filled 2021-03-18 (×2): qty 30

## 2021-03-18 MED ORDER — POTASSIUM CHLORIDE CRYS ER 20 MEQ PO TBCR
40.0000 meq | EXTENDED_RELEASE_TABLET | Freq: Once | ORAL | Status: AC
  Administered 2021-03-18: 40 meq via ORAL
  Filled 2021-03-18: qty 2

## 2021-03-18 MED ORDER — PHENOL 1.4 % MT LIQD
1.0000 | OROMUCOSAL | Status: DC | PRN
  Filled 2021-03-18: qty 177

## 2021-03-18 MED ORDER — CHLORHEXIDINE GLUCONATE CLOTH 2 % EX PADS
6.0000 | MEDICATED_PAD | Freq: Every day | CUTANEOUS | Status: AC
  Administered 2021-03-19 – 2021-03-23 (×5): 6 via TOPICAL

## 2021-03-18 NOTE — Progress Notes (Addendum)
Cross Cover ?Blood culture ID again showed staph epi, methicillin resistence detected. However, Procal 0.46 in setting of endometrial cancer receiving radiation treatment.  Not high enough to indicate escalation of antibiotics. Patient remains afebrile and no elevation of WBC's ??contamination x2 ?Will repeat procal in morning. If above 0.5, probably should add vancomycin to antibiotic regimen.  ?

## 2021-03-18 NOTE — Progress Notes (Signed)
Inpatient Rehab Admissions Coordinator:  ? ?Per therapy recommendations, patient was screened for CIR candidacy by Clemens Catholic, MS, CCC-SLP. At this time, Pt. does not appear to demonstrate medical necessity to justify in hospital rehabilitation/CIR. will not pursue a rehab consult for this Pt. Pt.'s insurance is also very unlikely to approve CIR.    Recommend other rehab venues to be pursued.  Please contact me with any questions. ? ?Clemens Catholic, MS, CCC-SLP ?Rehab Admissions Coordinator  ?[phone] (celll) ?[phone] (office) ? ? ?

## 2021-03-18 NOTE — Evaluation (Signed)
Occupational Therapy Evaluation ?Patient Details ?Name: Selena Rodgers ?MRN: [id number] ?DOB: [birth year] ?Today's Date: 03/18/2021 ? ? ?History of Present Illness Pt is a 71 y.o. female presenting to hospital 3/16 for evaluation of weakness (slipped forward out of recliner and unable to get back up).  Pt admitted with generalized weakness, CAP, anemia, SOB, hyponatremia, and endometrial CA (currently receiving treatment).  Concern for cellulitis in L abdominal pannus.  Pt with large pendulous hernia of LLQ of abdomen.  PMH includes endometrial CA (daily radiation), DM, htn, anxiety, chronic back pain, spinal stenosis, uterine CA, ventral and umbilical hernia repair, ACDF, CTR, and foot surgery.  ? ?Clinical Impression ?  ?Patient presenting with decreased Ind in self care, balance, functional mobility/transfers, endurance, functional mobility/transfers,and safety awareness.  Patient reports living at home alone and mod I with use of AD and modifications as needed. At baseline, pt utilizes rollator for ambulation within her home and performs toileting, bathing, and dressing without assist. Family does assist with some IADL tasks and taking her to appointments because she does not drive.  Patient is limited this session secondary to increased pain. Pt needing mod- max A for bed mobility. Pt having BM in bed and unaware and needing total assist for hygiene and linen change. Pt declined EOB/OOB secondary to increased pain. Pt remains in L side lying position for comfort. Pt is very far from baseline and would benefit from intensive rehab program to increase independence for self care. Patient will benefit from acute OT to increase overall independence in the areas of ADLs, functional mobility, safety awareness in order to safely discharge to next venue of care.  ?   ? ?Recommendations for follow up therapy are one component of a multi-disciplinary discharge planning process, led by the attending physician.   Recommendations may be updated based on patient status, additional functional criteria and insurance authorization.  ? ?Follow Up Recommendations ? Acute inpatient rehab (3hours/day)  ?  ?Assistance Recommended at Discharge Frequent or constant Supervision/Assistance  ?Patient can return home with the following Two people to help with walking and/or transfers;A lot of help with bathing/dressing/bathroom;Help with stairs or ramp for entrance;Assist for transportation ? ?  ?Functional Status Assessment ? Patient has had a recent decline in their functional status and demonstrates the ability to make significant improvements in function in a reasonable and predictable amount of time.  ?Equipment Recommendations ? Other (comment) (defer to next venue of care)  ?  ?   ?Precautions / Restrictions Precautions ?Precautions: Fall ?Precaution Comments: Large pendulous hernia of LLQ of abdomen ?Restrictions ?Weight Bearing Restrictions: No ?Other Position/Activity Restrictions: Pt wears specialized shoes whenever out of bed  ? ?  ? ?Mobility Bed Mobility ?Overal bed mobility: Needs Assistance ?Bed Mobility: Rolling ?Rolling: Max assist ?  ?  ?  ?  ?  ?  ? ?Transfers ?  ?  ?  ?  ?  ?  ?  ?  ?  ?General transfer comment: Pt declined secondary to pain ?  ? ?  ?   ? ?ADL either performed or assessed with clinical judgement  ? ?ADL Overall ADL's : Needs assistance/impaired ?  ?  ?  ?  ?  ?  ?  ?  ?  ?  ?  ?  ?  ?  ?  ?  ?  ?  ?  ?General ADL Comments: UB self care with set up A and LB self care with max- total A. Pt having BM in bed  and needing total a for hygiene.  ? ? ? ?Vision Patient Visual Report: No change from baseline ?   ?   ?   ?   ? ?Pertinent Vitals/Pain Pain Assessment ?Pain Assessment: 0-10 ?Pain Score: 8  ?Pain Location: back pain ?Pain Descriptors / Indicators: Aching, Radiating ?Pain Intervention(s): Limited activity within patient's tolerance, Monitored during session, Repositioned, Patient requesting pain meds-RN  notified  ? ? ? ?Hand Dominance Right ?  ?Extremity/Trunk Assessment Upper Extremity Assessment ?Upper Extremity Assessment: Generalized weakness ?  ?Lower Extremity Assessment ?Lower Extremity Assessment: Defer to PT evaluation ?RLE Deficits / Details: at least 3/5 AROM DF/PF; hip flexion and knee flexion/extension at least 2+/5 but limited d/t pt feeling like R LE was cramping ?RLE: Unable to fully assess due to pain ?LLE Deficits / Details: at least 3/5 AROM DF/PF; hip flexion and knee flexion/extension at least 3/5 ?  ?Cervical / Trunk Assessment ?Cervical / Trunk Assessment: Other exceptions ?Cervical / Trunk Exceptions: forward head/shoulders ?  ?Communication Communication ?Communication: No difficulties ?  ?Cognition Arousal/Alertness: Awake/alert ?Behavior During Therapy: [facility] for tasks assessed/performed ?Overall Cognitive Status: Within Functional Limits for tasks assessed ?  ?  ?  ?  ?  ?  ?  ?  ?  ?  ?  ?  ?  ?  ?  ?  ?  ?  ?  ?General Comments  Large LLQ hernia noted (pt reports not using any special garments for it and does not do anything special for it) ? ?  ?   ?   ? ? ?Home Living Family/patient expects to be discharged to:: Private residence ?Living Arrangements: Alone ?Available Help at Discharge: Family;Available PRN/intermittently ?Type of Home: House ?Home Access: Ramped entrance ?  ?  ?Home Layout: One level ?  ?  ?Bathroom Shower/Tub: Tub/shower unit ?  ?Bathroom Toilet: Standard ?  ?  ?Home Equipment: Rollator (4 wheels);Standard Walker;Crutches;Wheelchair - manual;Grab bars - tub/shower;Grab bars - toilet;BSC/3in1;Tub bench ?  ?  ?  ? ?  ?Prior Functioning/Environment Prior Level of Function : Needs assist ?  ?  ?  ?  ?  ?  ?Mobility Comments: Modified independent ambulating with rollator (or uses manual w/c) within home depending on how she is feeling; uses SW in bathroom (d/t smaller bathroom); uses 1 crutch and railing on ramp (pt uses w/c provided at facility for appts/radiation) ?ADLs  Comments: Uses shower bench; has assist every 2 weeks for vacuuming, mopping, and bed linen change; pt's sister assists pt with appts and groceries. Pt performs dressing, bathing, and toileting without assist. ?  ? ?  ?  ?OT Problem List: Decreased strength;Decreased activity tolerance;Impaired balance (sitting and/or standing);Decreased knowledge of use of DME or AE;Decreased safety awareness;Pain ?  ?   ?OT Treatment/Interventions: Self-care/ADL training;Therapeutic exercise;Therapeutic activities;Energy conservation;DME and/or AE instruction;Manual therapy;Balance training;Patient/family education;Cognitive remediation/compensation;Modalities  ?  ?OT Goals(Current goals can be found in the care plan section) Acute Rehab OT Goals ?Patient Stated Goal: to decrease pain ?OT Goal Formulation: With patient ?Time For Goal Achievement: 04/01/21 ?Potential to Achieve Goals: Good ?ADL Goals ?Pt Will Perform Grooming: sitting;with set-up;with supervision ?Pt Will Perform Lower Body Dressing: with mod assist ?Pt Will Transfer to Toilet: with mod assist ?Pt Will Perform Toileting - Clothing Manipulation and hygiene: with mod assist  ?OT Frequency: Min 3X/week ?  ? ?   ?AM-PAC OT "6 Clicks" Daily Activity     ?Outcome Measure Help from another person eating meals?: None ?Help from another person  taking care of personal grooming?: A Little ?Help from another person toileting, which includes using toliet, bedpan, or urinal?: Total ?Help from another person bathing (including washing, rinsing, drying)?: A Lot ?Help from another person to put on and taking off regular upper body clothing?: A Little ?Help from another person to put on and taking off regular lower body clothing?: Total ?6 Click Score: 14 ?  ?End of Session Nurse Communication: Mobility status;Patient requests pain meds ? ?Activity Tolerance: Patient limited by pain ?Patient left: in bed;with bed alarm set;with call bell/phone within reach ? ?OT Visit Diagnosis:  Unsteadiness on feet (R26.81);History of falling (Z91.81);Repeated falls (R29.6);Muscle weakness (generalized) (M62.81)  ?              ?Time: 9021-1155 ?OT Time Calculation (min): 23 min ?Charges:  [place]

## 2021-03-18 NOTE — Evaluation (Signed)
Physical Therapy Evaluation ?Patient Details ?Name: Selena Rodgers ?MRN: [id number] ?DOB: 25-Jun-1950 ?Today's Date: 03/18/2021 ? ?History of Present Illness ? Pt is a 71 y.o. female presenting to hospital 3/16 for evaluation of weakness (slipped forward out of recliner and unable to get back up).  Pt admitted with generalized weakness, CAP, anemia, SOB, hyponatremia, and endometrial CA (currently receiving treatment).  Concern for cellulitis in L abdominal pannus.  Pt with large pendulous hernia of LLQ of abdomen.  PMH includes endometrial CA (daily radiation), DM, htn, anxiety, chronic back pain, spinal stenosis, uterine CA, ventral and umbilical hernia repair, ACDF, CTR, and foot surgery.  ?Clinical Impression ? Prior to hospital admission, pt was modified independent with functional mobility (uses rollator vs manual w/c within home depending on how she was feeling; uses SW in bathroom d/t small bathroom; and uses crutch and railing to navigate ramp in/out of home); lives alone in 1 level home with ramp to enter.  Therapist assisted pt with donning socks and her shoes for OOB mobility (pt reports always wearing her specialized shoes when OOB).  Currently pt is max assist semi-supine to sitting edge of bed; SBA for sitting balance; and mod to max assist x2 to stand from elevated bed up to walker (x3 trials).  Pt wanting to get to recliner but unable to take any steps with 2 assist and walker use so utilized hoyer lift bed to recliner with 3 assist (nurse present assisting).  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  Pt would benefit from skilled PT to address noted impairments and functional limitations (see below for any additional details).  Upon hospital discharge, pt would benefit from acute inpatient rehab.    ? ?Recommendations for follow up therapy are one component of a multi-disciplinary discharge planning process, led by the attending physician.  Recommendations may be updated based on patient status, additional functional criteria and insurance authorization. ? ?Follow Up Recommendations Acute inpatient rehab (3hours/day) ? ?  ?Assistance Recommended at Discharge Frequent or constant Supervision/Assistance  ?Patient can return home with the following ? Two people to help with walking and/or transfers;Two people to help with bathing/dressing/bathroom;Assistance with cooking/housework;Assist for transportation;Help with stairs or ramp for entrance ? ?  ?Equipment Recommendations Rolling walker (2 wheels) (bariatric)  ?Recommendations for Other Services ? OT consult  ?  ?Functional Status Assessment Patient has had a recent decline in their functional status and demonstrates the ability to make significant improvements in function in a reasonable and predictable amount of time.  ? ?  ?Precautions / Restrictions Precautions ?Precautions: Fall ?Precaution Comments: Large pendulous hernia of LLQ of abdomen ?Restrictions ?Weight Bearing Restrictions: No ?Other Position/Activity Restrictions: Pt wears specialized shoes whenever out of bed  ? ?  ? ?Mobility ? Bed Mobility ?Overal bed mobility: Needs Assistance ?Bed Mobility: Supine to Sit ?  ?  ?Supine to sit: Max assist, HOB elevated ?  ?  ?General bed mobility comments: assist for trunk and B LE's; use of bed rail; vc's for technique ?  ? ?Transfers ?Overall transfer level: Needs assistance ?Equipment used: Rolling walker (2 wheels) (bariatric) ?Transfers: Sit to/from Stand ?Sit to Stand: From elevated surface ?  ?  ?  ?  ?  ?General transfer comment: bed height significantly elevated; unable to stand with 1 assist; able to stand  up to walker x3 trials with mod to max assist x2; vc's for UE/LE placement; pt unable to stand fully upright--pt leaning forward on walker (pt  reports d/t h/o back issues) ?  ? ?Ambulation/Gait ?  ?  ?  ?  ?  ?  ?  ?General Gait Details: pt unable to take any steps with 2 assist and walker use ? ?Stairs ?  ?  ?  ?  ?  ? ?Wheelchair Mobility ?  ? ?Modified Rankin (Stroke Patients Only) ?  ? ?  ? ?Balance Overall balance assessment: Needs assistance ?Sitting-balance support: No upper extremity supported, Feet supported ?Sitting balance-Leahy Scale: Good ?Sitting balance - Comments: steady sitting reaching within BOS ?  ?  ?  ?  ?  ?  ?  ?  ?  ?  ?  ?  ?  ?  ?  ?   ? ? ? ?Pertinent Vitals/Pain Pain Assessment ?Pain Assessment: Faces ?Faces Pain Scale: Hurts a little bit ?Pain Location: chronic low back pain ?Pain Descriptors / Indicators: Aching ?Pain Intervention(s): Limited activity within patient's tolerance, Monitored during session, Repositioned ?Vitals stable and WFL throughout treatment session.  ? ? ?Home Living Family/patient expects to be discharged to:: Private residence ?Living Arrangements: Alone ?Available Help at Discharge: Family;Available PRN/intermittently ?Type of Home: House ?Home Access: Ramped entrance ?  ?  ?  ?Home Layout: One level ?Home Equipment: Rollator (4 wheels);Standard Walker;Crutches;Wheelchair - manual;Grab bars - tub/shower;Grab bars - toilet;BSC/3in1;Tub bench ?   ?  ?Prior Function Prior Level of Function : Needs assist ?  ?  ?  ?  ?  ?  ?Mobility Comments: Modified independent ambulating with rollator (or uses manual w/c) within home depending on how she is feeling; uses SW in bathroom (d/t smaller bathroom); uses 1 crutch and railing on ramp (pt uses w/c provided at facility for appts/radiation) ?ADLs Comments: Uses shower bench; has assist every 2 weeks for vacuuming, mopping, and bed linen change; pt's sister assists pt with appts and groceries. ?  ? ? ?Hand Dominance  ?   ? ?  ?Extremity/Trunk Assessment  ? Upper Extremity Assessment ?Upper Extremity Assessment: Generalized weakness ?  ? ?Lower Extremity  Assessment ?Lower Extremity Assessment: RLE deficits/detail;LLE deficits/detail ?RLE Deficits / Details: at least 3/5 AROM DF/PF; hip flexion and knee flexion/extension at least 2+/5 but limited d/t pt feeling like R LE was cramping ?RLE: Unable to fully assess due to pain ?LLE Deficits / Details: at least 3/5 AROM DF/PF; hip flexion and knee flexion/extension at least 3/5 ?  ? ?Cervical / Trunk Assessment ?Cervical / Trunk Assessment: Other exceptions ?Cervical / Trunk Exceptions: forward head/shoulders  ?Communication  ? Communication: No difficulties  ?Cognition Arousal/Alertness: Awake/alert ?Behavior During Therapy: [facility] for tasks assessed/performed ?Overall Cognitive Status: Within Functional Limits for tasks assessed ?  ?  ?  ?  ?  ?  ?  ?  ?  ?  ?  ?  ?  ?  ?  ?  ?  ?  ?  ? ?  ?General Comments General comments (skin integrity, edema, etc.): Large LLQ hernia noted (pt reports not using any special garments for it and does not do anything special for it).  Nursing cleared pt for participation in physical therapy.  Pt agreeable to PT session. ? ?  ?Exercises  Transfer training  ? ?Assessment/Plan  ?  ?PT Assessment Patient needs continued PT services  ?PT Problem List Decreased strength;Decreased activity tolerance;Decreased balance;Decreased mobility;Decreased knowledge of  use of DME;Decreased knowledge of precautions;Pain;Decreased skin integrity;Obesity ? ?   ?  ?PT Treatment Interventions DME instruction;Gait training;Functional mobility training;Therapeutic activities;Therapeutic exercise;Balance training;Patient/family education   ? ?PT Goals (Current goals can be found in the Care Plan section)  ?Acute Rehab PT Goals ?Patient Stated Goal: to improve strength and mobility ?PT Goal Formulation: With patient ?Time For Goal Achievement: 04/01/21 ?Potential to Achieve Goals: Good ? ?  ?Frequency Min 2X/week ?  ? ? ?Co-evaluation   ?  ?  ?  ?  ? ? ?  ?AM-PAC PT "6 Clicks" Mobility  ?Outcome Measure Help needed  turning from your back to your side while in a flat bed without using bedrails?: A Lot ?Help needed moving from lying on your back to sitting on the side of a flat bed without using bedrails?: A Lot ?Help needed moving to and

## 2021-03-18 NOTE — Progress Notes (Signed)
PHARMACY - PHYSICIAN COMMUNICATION ?CRITICAL VALUE ALERT - BLOOD CULTURE IDENTIFICATION (BCID) ? ?Selena Rodgers is an 71 y.o. female w/ h/o  arthritis and endometrial cancer (currently on Radiation therapy), T2DM, & HTN who presented to [facility] on 03/16/2021 with a chief complaint of extreme fatigue >> c/f CAP + Cellulitis of abdominal wall.  ? ?Assessment:  BCID (reflexed from the 03/17/21 2223- 3rd and 4th bottles (2nd set) resulted  with Staphylococcus spp>> Staph Epidermidis (Mec A/C resistance detected). Pt is currently on Ceftriaxone & Azithromycin, afebrile, no leukocytosis, procal 0.43>>0.46. This could still represent a contaminant and pt has a repeat set of blood cultures from 3/1 ? ?Name of physician (or Provider) Contacted: Selena Meyer NP ? ?Current antibiotics: Ceftriaxone & azithromycin (CAP/Cellulitis) ? ?Changes to prescribed antibiotics recommended:  ?-- Plan to continue with CTX/Azith for now. Per NP: No fever, no white count. Possibly colonized? ?If procal goes above 0.50 may consider adding Vancomycin ? ?Results for orders placed or performed during the hospital encounter of 03/16/21  ?Blood Culture ID Panel (Reflexed) (Collected: 03/17/2021 10:23 PM)  ?Result Value Ref Range  ? Enterococcus faecalis NOT DETECTED NOT DETECTED  ? Enterococcus Faecium NOT DETECTED NOT DETECTED  ? Listeria monocytogenes NOT DETECTED NOT DETECTED  ? Staphylococcus species DETECTED (A) NOT DETECTED  ? Staphylococcus aureus (BCID) NOT DETECTED NOT DETECTED  ? Staphylococcus epidermidis DETECTED (A) NOT DETECTED  ? Staphylococcus lugdunensis NOT DETECTED NOT DETECTED  ? Streptococcus species NOT DETECTED NOT DETECTED  ? Streptococcus agalactiae NOT DETECTED NOT DETECTED  ? Streptococcus pneumoniae NOT DETECTED NOT DETECTED  ? Streptococcus pyogenes NOT DETECTED NOT DETECTED  ? A.calcoaceticus-baumannii NOT DETECTED NOT DETECTED  ? Bacteroides fragilis NOT DETECTED NOT DETECTED  ? Enterobacterales NOT DETECTED  NOT DETECTED  ? Enterobacter cloacae complex NOT DETECTED NOT DETECTED  ? Escherichia coli NOT DETECTED NOT DETECTED  ? Klebsiella aerogenes NOT DETECTED NOT DETECTED  ? Klebsiella oxytoca NOT DETECTED NOT DETECTED  ? Klebsiella pneumoniae NOT DETECTED NOT DETECTED  ? Proteus species NOT DETECTED NOT DETECTED  ? Salmonella species NOT DETECTED NOT DETECTED  ? Serratia marcescens NOT DETECTED NOT DETECTED  ? Haemophilus influenzae NOT DETECTED NOT DETECTED  ? Neisseria meningitidis NOT DETECTED NOT DETECTED  ? Pseudomonas aeruginosa NOT DETECTED NOT DETECTED  ? Stenotrophomonas maltophilia NOT DETECTED NOT DETECTED  ? Candida albicans NOT DETECTED NOT DETECTED  ? Candida auris NOT DETECTED NOT DETECTED  ? Candida glabrata NOT DETECTED NOT DETECTED  ? Candida krusei NOT DETECTED NOT DETECTED  ? Candida parapsilosis NOT DETECTED NOT DETECTED  ? Candida tropicalis NOT DETECTED NOT DETECTED  ? Cryptococcus neoformans/gattii NOT DETECTED NOT DETECTED  ? Methicillin resistance mecA/C DETECTED (A) NOT DETECTED  ? ? ?Selena Rodgers A ?03/18/2021  7:39 PM ? ?

## 2021-03-18 NOTE — Assessment & Plan Note (Addendum)
Staphylococcus epidermidis bacteremia ? ?BC ID did turn positive for Staph epidermidis.  Repeat set also reveals Staph epidermidis.  Patient started on vancomycin.   ?Sensitivities reviewed.   ?ID was consulted.  Continue vancomycin.   ?Still waiting on echocardiogram report.   ?TEE was recommended by ID but patient's respiratory status and her morbid obesity and other comorbidities make it somewhat of a risky proposition.  Patient is hesitant too.  Discussed with ID.  They will recommend a longer course of antibiotics.  Plan for PICC line tomorrow if cultures from 3/19 remain negative. ?

## 2021-03-18 NOTE — Progress Notes (Signed)
? ?[facility] ?PROGRESS NOTE ? ? ?Selena Rodgers PRF:[id number] DOB: 27-Jun-1950 DOA: 03/16/2021  1 ?DOS: the patient was seen and examined on 03/18/2021 ? ?PCP: [name], MD ? ?Brief History and Hospital Course:  ?71 y.o. female with medical history significant of arthritis and endometrial cancer, dm II, HTN.  Patient is currently receiving radiation treatment at the cancer center.  She felt extreme fatigue over the last 48 hours.  Could not go for radiation treatment on the day of admission.  She also mention cough shortness of breath.  She was found to have hyponatremia.  CT scan raised concern for pneumonia.  She was hospitalized for further management.  There was also some concern for cellulitis in her left abdominal pannus. ? ?Consultants: GYN oncology will be notified. ? ?Procedures: None yet ? ? ? ?Subjective: ?Complains of some shortness of breath this morning with cough.  Denies any chest pain.  No nausea vomiting.  Overall she feels about the same as yesterday.   ? ? ?Assessment/Plan: ? ? ?* CAP (community acquired pneumonia) ?Patient presented with generalized weakness cough shortness of breath.  Chest x-ray did not show any infiltrate but CT scan did raise concern for pneumonia.  ?Patient required oxygen overnight.  Continue oxygen to maintain sats greater than 92%. ?Continues to have a cough.  Overall she is stable.  WBC is normal.  Procalcitonin 0.46. ?Cultures negative so far.   ?Continue ceftriaxone and azithromycin.   ? ?Bacteremia ?BC ID did turn positive for Staph epidermidis.  This is likely to be a contaminant.  Blood cultures have been repeated.  She remains afebrile.  Continue to monitor without any change in antibiotic regimen at this time. ? ?Cellulitis of abdominal wall ?Patient has maceration of the skin over the left abdomen/pannus.  There is evidence for erythema suggesting cellulitis.  No fluctuation is noted.   ?Seen by wound care.  Topical regimen  recommended. ?Continue to monitor.  We will continue ceftriaxone.  ? ?Endometrial cancer ([place]) ?Currently getting radiation treatment.  Supposed to end March 29.  She missed her treatment yesterday.  Oncology team was added to treatment team yesterday.  We will notify them over secure chat today. ? ?Generalized weakness ?Hypokalemia ? ?Likely due to dehydration, pneumonia, anemia.  Vital signs are stable.  Lactic acid level was normal.  CK was noted to be elevated at 819.  Continue to monitor.  ?PT and OT evaluation. ?Replace potassium.  Magnesium is 2.1. ? ?Hyponatremia ?Previously normal.  Likely due to dehydration.  ?Sodium levels slightly better today compared to yesterday.  Continue IV fluids.  Urine studies are pending.  ? ?Diabetes ([place]) ?Monitor CBGs.  Continue SSI. ? ? ?Anemia ?Due to vaginal bleeding from her cancer patient has required blood transfusions previously.   ?Hemoglobin stable during this hospitalization.  Continue to monitor. ? ?GERD (gastroesophageal reflux disease) ?Continue PPI. ? ?Current tobacco use ?Nicotine patch. ? ? ? ?Morbid obesity ?Estimated body mass index is 44.64 kg/m? as calculated from the following: ?  Height as of this encounter: '5\' 7"'$  (1.702 m). ?  Weight as of this encounter: 129.3 kg. ? ? ?  ?DVT Prophylaxis: Subcutaneous heparin ?Code Status: Full code ?Family Communication: Discussed with patient.  No family at bedside ?Disposition Plan: To be determined. ? ?Status is: Inpatient ?Remains inpatient appropriate because: Need for IV antibiotics. ? ? ? ? ?Medications: Scheduled: ? acetaminophen  1,000 mg Oral TID  ? azithromycin  500 mg Oral Daily  ?  citalopram  20 mg Oral Daily  ? heparin  5,000 Units Subcutaneous Q8H  ? insulin aspart  0-9 Units Subcutaneous TID WC  ? insulin aspart  2 Units Subcutaneous TID WC  ? megestrol  40 mg Oral Daily  ? pantoprazole  40 mg Oral Q1200  ? senna-docusate  2 tablet Oral QHS  ? ?Continuous: ? sodium chloride 50 mL/hr at 03/18/21 0949  ?  cefTRIAXone (ROCEPHIN)  IV 2 g (03/17/21 2341)  ? ?ELF:YBOFBPZWCHENI **OR** acetaminophen, ALPRAZolam, hydrALAZINE, oxyCODONE, phenol, polyethylene glycol ? ?Antibiotics: ?Anti-infectives (From admission, onward)  ? ? Start     Dose/Rate Route Frequency Ordered Stop  ? 03/18/21 0000  cefTRIAXone (ROCEPHIN) 2 g in sodium chloride 0.9 % 100 mL IVPB       ? 2 g ?200 mL/hr over 30 Minutes Intravenous Every 24 hours 03/17/21 0931 03/24/21 2359  ? 03/17/21 1030  azithromycin (ZITHROMAX) tablet 500 mg       ? 500 mg Oral Daily 03/17/21 0931 03/22/21 0959  ? 03/17/21 0045  cefTRIAXone (ROCEPHIN) 1 g in sodium chloride 0.9 % 100 mL IVPB       ? 1 g ?200 mL/hr over 30 Minutes Intravenous  Once 03/17/21 0030 03/17/21 0115  ? ?  ? ? ?Objective: ? ?Vital Signs ? ?Vitals:  ? 03/17/21 2042 03/18/21 0027 03/18/21 0618 03/18/21 7782  ?BP: (!) 169/61 (!) 145/67 (!) 146/57 (!) 142/59  ?Pulse: 98 83 90 86  ?Resp: '20 20 20 18  '$ ?Temp: 99.2 ?F (37.3 ?C) 97.7 ?F (36.5 ?C) 98.2 ?F (36.8 ?C) 98.4 ?F (36.9 ?C)  ?TempSrc:    Oral  ?SpO2: 100% 99% 100% 100%  ?Weight:      ?Height:      ? ? ?Intake/Output Summary (Last 24 hours) at 03/18/2021 1031 ?Last data filed at 03/18/2021 0600 ?Gross per 24 hour  ?Intake 1498 ml  ?Output 750 ml  ?Net 748 ml  ? ?Filed Weights  ? 03/16/21 2315  ?Weight: 129.3 kg  ? ? ?General appearance: Awake alert.  In no distress ?Resp: Mildly tachypneic at rest.  Reasonably good air entry bilaterally with few crackles bilateral bases.  No wheezing or rhonchi ?Cardio: S1-S2 is normal regular.  No S3-S4.  No rubs murmurs or bruit ?GI: Abdomen is soft.  Dressing noted over the left lower abdomen/pannus. ?Extremities: No edema.  Physical deconditioning noted ?Neurologic: Alert and oriented x3.  No focal neurological deficits.  ? ? ? ?Lab Results: ? ?Data Reviewed: I have personally reviewed labs and imaging study reports ? ?CBC: ?Recent Labs  ?Lab 03/16/21 ?2329 03/17/21 ?4235 03/18/21 ?0433  ?WBC 8.4 7.0 5.9  ?NEUTROABS 7.6   --   --   ?HGB 9.7* 8.7* 9.0*  ?HCT 31.1* 27.8* 28.4*  ?MCV 87.6 86.1 86.3  ?PLT 153 137* 125*  ? ? ?Basic Metabolic Panel: ?Recent Labs  ?Lab 03/16/21 ?2329 03/17/21 ?3614 03/18/21 ?0433  ?NA 127* 128* 130*  ?K 3.9 3.6 3.2*  ?CL 96* 99 99  ?CO2 19* 18* 20*  ?GLUCOSE 189* 167* 145*  ?BUN '15 14 15  '$ ?CREATININE 0.77 0.82 0.75  ?CALCIUM 8.8* 8.5* 8.7*  ?MG 1.8  --  2.1  ? ? ?GFR: ?Estimated Creatinine Clearance: 94.2 mL/min (by C-G formula based on SCr of 0.75 mg/dL). ? ?Liver Function Tests: ?Recent Labs  ?Lab 03/16/21 ?2329 03/17/21 ?0416  ?AST 28 30  ?ALT 14 13  ?ALKPHOS 50 42  ?BILITOT 0.7 0.5  ?PROT 7.8 6.8  ?ALBUMIN 3.4* 3.1*  ? ? ? ?  Coagulation Profile: ?Recent Labs  ?Lab 03/16/21 ?2329  ?INR 1.3*  ? ? ?Cardiac Enzymes: ?Recent Labs  ?Lab 03/16/21 ?2329  ?CKTOTAL 819*  ? ? ? ?CBG: ?Recent Labs  ?Lab 03/17/21 ?4585 03/17/21 ?1152 03/17/21 ?1625 03/17/21 ?2151 03/18/21 ?9292  ?GLUCAP 168* 143* 183* 134* 153*  ? ? ? ?Anemia Panel: ?Recent Labs  ?  03/17/21 ?0416  ?RETICCTPCT 1.4  ? ? ?Recent Results (from the past 240 hour(s))  ?Blood Culture (routine x 2)     Status: None (Preliminary result)  ? Collection Time: 03/16/21 11:29 PM  ? Specimen: BLOOD  ?Result Value Ref Range Status  ? Specimen Description   Final  ?  BLOOD RIGHT ASSIST CONTROL ?Performed at [facility], [address] [place], [place] [address] ?  ? Special Requests   Final  ?  BOTTLES DRAWN AEROBIC AND ANAEROBIC Blood Culture adequate volume ?Performed at [facility], [address], [place], [place] [address] ?  ? Culture  Setup Time   Final  ?  Organism ID to follow ?GRAM POSITIVE COCCI ?IN BOTH AEROBIC AND ANAEROBIC BOTTLES ?CRITICAL RESULT CALLED TO, READ BACK BY AND VERIFIED WITH: BRANDON BEERS '@2125'$  ON 03/17/21 SKL ?GRAM STAIN REVIEWED-AGREE WITH RESULT ?Performed at [facility], [place] [address] [place], [place] [address] ?  ? Culture GRAM POSITIVE COCCI  Final  ? Report Status PENDING  Incomplete  ?Blood Culture  ID Panel (Reflexed)     Status: Abnormal  ? Collection Time: 03/16/21 11:29 PM  ?Result Value Ref Range Status  ? Enterococcus faecalis NOT DETECTED NOT DETECTED Final  ? Enterococcus Faecium NOT DETECTED NOT DETECTED Fi

## 2021-03-19 ENCOUNTER — Inpatient Hospital Stay: Payer: Medicare HMO

## 2021-03-19 DIAGNOSIS — J189 Pneumonia, unspecified organism: Secondary | ICD-10-CM | POA: Diagnosis not present

## 2021-03-19 DIAGNOSIS — L03311 Cellulitis of abdominal wall: Secondary | ICD-10-CM | POA: Diagnosis not present

## 2021-03-19 DIAGNOSIS — M5441 Lumbago with sciatica, right side: Secondary | ICD-10-CM

## 2021-03-19 DIAGNOSIS — D649 Anemia, unspecified: Secondary | ICD-10-CM | POA: Diagnosis not present

## 2021-03-19 DIAGNOSIS — R7881 Bacteremia: Secondary | ICD-10-CM | POA: Diagnosis not present

## 2021-03-19 DIAGNOSIS — M5442 Lumbago with sciatica, left side: Secondary | ICD-10-CM

## 2021-03-19 LAB — CBC
HCT: 26.1 % — ABNORMAL LOW (ref 36.0–46.0)
Hemoglobin: 8.3 g/dL — ABNORMAL LOW (ref 12.0–15.0)
MCH: 26.8 pg (ref 26.0–34.0)
MCHC: 31.8 g/dL (ref 30.0–36.0)
MCV: 84.2 fL (ref 80.0–100.0)
Platelets: 120 10*3/uL — ABNORMAL LOW (ref 150–400)
RBC: 3.1 MIL/uL — ABNORMAL LOW (ref 3.87–5.11)
RDW: 16.4 % — ABNORMAL HIGH (ref 11.5–15.5)
WBC: 3.4 10*3/uL — ABNORMAL LOW (ref 4.0–10.5)
nRBC: 0 % (ref 0.0–0.2)

## 2021-03-19 LAB — BASIC METABOLIC PANEL
Anion gap: 8 (ref 5–15)
BUN: 14 mg/dL (ref 8–23)
CO2: 19 mmol/L — ABNORMAL LOW (ref 22–32)
Calcium: 8.4 mg/dL — ABNORMAL LOW (ref 8.9–10.3)
Chloride: 102 mmol/L (ref 98–111)
Creatinine, Ser: 0.64 mg/dL (ref 0.44–1.00)
GFR, Estimated: >60 mL/min (ref >60)
Glucose, Bld: 151 mg/dL — ABNORMAL HIGH (ref 70–99)
Potassium: 3.5 mmol/L (ref 3.5–5.1)
Sodium: 129 mmol/L — ABNORMAL LOW (ref 135–145)

## 2021-03-19 LAB — GLUCOSE, CAPILLARY
Glucose-Capillary: 136 mg/dL — ABNORMAL HIGH (ref 70–99)
Glucose-Capillary: 143 mg/dL — ABNORMAL HIGH (ref 70–99)
Glucose-Capillary: 139 mg/dL — ABNORMAL HIGH (ref 70–99)
Glucose-Capillary: 122 mg/dL — ABNORMAL HIGH (ref 70–99)

## 2021-03-19 LAB — CK: Total CK: 709 U/L — ABNORMAL HIGH (ref 38–234)

## 2021-03-19 LAB — PROCALCITONIN: Procalcitonin: 0.36 ng/mL

## 2021-03-19 MED ORDER — VANCOMYCIN HCL 2000 MG/400ML IV SOLN
2000.0000 mg | Freq: Once | INTRAVENOUS | Status: AC
  Administered 2021-03-19: 2000 mg via INTRAVENOUS
  Filled 2021-03-19: qty 400

## 2021-03-19 MED ORDER — VANCOMYCIN HCL IN DEXTROSE 1-5 GM/200ML-% IV SOLN
1000.0000 mg | Freq: Two times a day (BID) | INTRAVENOUS | Status: DC
  Administered 2021-03-19 – 2021-03-24 (×11): 1000 mg via INTRAVENOUS
  Filled 2021-03-19 (×14): qty 200

## 2021-03-19 MED ORDER — LORAZEPAM 2 MG/ML IJ SOLN
0.5000 mg | Freq: Once | INTRAMUSCULAR | Status: AC
Start: 2021-03-19 — End: 2021-03-19
  Administered 2021-03-19: 0.5 mg via INTRAVENOUS

## 2021-03-19 MED ORDER — LOPERAMIDE HCL 2 MG PO CAPS
4.0000 mg | ORAL_CAPSULE | Freq: Three times a day (TID) | ORAL | Status: DC | PRN
Start: 2021-03-19 — End: 2021-03-30
  Administered 2021-03-19 – 2021-03-29 (×6): 4 mg via ORAL
  Filled 2021-03-19 (×6): qty 2

## 2021-03-19 MED ORDER — LORAZEPAM 2 MG/ML IJ SOLN: 0.5000 mg | Freq: Once | INTRAMUSCULAR | Status: DC

## 2021-03-19 MED ORDER — SODIUM CHLORIDE 0.9 % IV SOLN: INTRAVENOUS | Status: DC

## 2021-03-19 MED ORDER — LORAZEPAM 2 MG/ML IJ SOLN
0.5000 mg | Freq: Once | INTRAMUSCULAR | Status: DC | PRN
  Filled 2021-03-19: qty 1

## 2021-03-19 MED ORDER — SODIUM CHLORIDE 1 G PO TABS
1.0000 g | ORAL_TABLET | Freq: Two times a day (BID) | ORAL | Status: DC
  Administered 2021-03-19 – 2021-03-29 (×21): 1 g via ORAL
  Filled 2021-03-19 (×21): qty 1

## 2021-03-19 NOTE — Progress Notes (Signed)
Pharmacy Antibiotic Note ? ?Selena Rodgers is a 71 y.o. female w/ PMH of arthritis and endometrial cancer (currently on Radiation therapy), T2DM, & HTNadmitted on 03/16/2021 with extreme fatigue >> c/f CAP + cellulitis of abdominal wall and newly discovered MRSE bacteremia.  Pharmacy has been consulted for vancomycin dosing. ? ?Plan: start vancomycin 2000 mg IV x 1, then 1000 mg IV every 12 hours ?Goal AUC 400-550 ?Expected AUC: 528 ?SCr used: 0.80 mg/dL (rounded up) ?Ke: 0.059 h-1, T1/2: 11.8 h ?Daily SCr while on IV vancomycin ? ?Height: '5\' 7"'$  (170.2 cm) ?Weight: 129.3 kg (285 lb) ?IBW/kg (Calculated) : 61.6 ? ?Temp (24hrs), Avg:98.6 ?F (37 ?C), Min:98 ?F (36.7 ?C), Max:99.4 ?F (37.4 ?C) ? ?Recent Labs  ?Lab 03/16/21 ?2329 03/17/21 ?4098 03/18/21 ?1191 03/19/21 ?4782  ?WBC 8.4 7.0 5.9 3.4*  ?CREATININE 0.77 0.82 0.75 0.64  ?LATICACIDVEN 1.6  --   --   --   ?  ?Estimated Creatinine Clearance: 94.2 mL/min (by C-G formula based on SCr of 0.64 mg/dL).   ? ?Allergies  ?Allergen Reactions  ? Chlorpheniramine Anaphylaxis  ? Flaxseed (Linseed) Anaphylaxis  ? Codeine Hives  ? Flax Seed Oil [Bio-Flax] Swelling  ?  UNSPECIFIED   ? Latex Hives and Other (See Comments)  ?  BLISTERS  ? Tape Other (See Comments)  ?  BLISTERS  ? Tapentadol Other (See Comments)  ?  BLISTERS  ? Rosemary Oil Swelling  ?  Lip swelling  ? 2,4-D Dimethylamine Rash  ?  T-DAP  ? Aspirin Rash  ? ? ?Antimicrobials this admission: ?03/16 ceftriaxone >>  ?03/16 azithromycin >>  ?03/18 vancomycin >>  ? ?Microbiology results: ?03/15 BCx: 4/4 MRSE ?03/16 Bcx: 2/4 sets GPC ?03/17 MRSA PCR: positive ? ?Thank you for allowing pharmacy to be a part of this patient?s care. ? ?Dallie Piles ?03/19/2021 9:33 AM ? ?

## 2021-03-19 NOTE — Assessment & Plan Note (Addendum)
Patient with chronic history of back pain but has noticed worsening leg pain as well as leg weakness in the last few days.  Denies any falls in her back.  She is able to move her legs but has diminished strength.   ?She has had spinal fusion surgery previously.  [name] in [place] is her neurosurgeon. ?MRI of the lumbar spine was done on 3/18 which does show significant disease but no different from previous MRI which was done in 2021.  Patient and sister reassured.  Continue to monitor.  Physical therapy.  Pain control.   ? ?

## 2021-03-19 NOTE — Progress Notes (Signed)
? ?[facility] ?PROGRESS NOTE ? ? ?KOYA HUNGER ZOX:[id number] DOB: May 24, 1950 DOA: 03/16/2021  2 ?DOS: the patient was seen and examined on 03/19/2021 ? ?PCP: [name], MD ? ?Brief History and Hospital Course:  ?71 y.o. female with medical history significant of arthritis and endometrial cancer, dm II, HTN.  Patient is currently receiving radiation treatment at the cancer center.  She felt extreme fatigue over the last 48 hours.  Could not go for radiation treatment on the day of admission.  She also mention cough shortness of breath.  She was found to have hyponatremia.  CT scan raised concern for pneumonia.  She was hospitalized for further management.  There was also some concern for cellulitis in her left abdominal pannus.  Patient then started complaining of back pain and pain radiating down her legs.  MRI to be ordered ? ?Consultants: GYN oncology notified. ? ?Procedures: None yet ? ? ? ?Subjective: ?Shortness of breath and cough has improved.  Continues to have back pain and pain radiating down her legs.  Able to move her legs but much more weak than before.   ? ? ?Assessment/Plan: ? ? ?* CAP (community acquired pneumonia) ?Patient presented with generalized weakness cough shortness of breath.  Chest x-ray did not show any infiltrate but CT scan did raise concern for pneumonia.  ?Required oxygen briefly but taken off yesterday evening. ?Patient mentions that she is getting better.  Cough is improving. ?WBC noted to be leukopenic today.  Procalcitonin 0.36 today compared to 0.46. ?Continue ceftriaxone and azithromycin. ? ?Bacteremia ?BC ID did turn positive for Staph epidermidis.  Repeat set also reveals Staph epidermidis.  Could still be a contaminant but we will go ahead and initiate vancomycin.  Wait for final identification and sensitivities.   ? ?Cellulitis of abdominal wall ?Patient has maceration of the skin over the left abdomen/pannus.  There is evidence for erythema suggesting  cellulitis.  No fluctuation is noted.   ?Seen by wound care.  Topical regimen recommended. ?Continue ceftriaxone.  Continue wound care. ? ?Endometrial cancer ([place]) ?Has been receiving radiation treatment.  Supposed to end March 29.  ?Oncology was notified of admission.  Radiation treatments on hold.  ?No evidence for vaginal bleeding. ? ?Generalized weakness ?Hypokalemia ? ?Likely due to dehydration, pneumonia, anemia.  Vital signs are stable.  Lactic acid level was normal.  CK was noted to be elevated at 819, improved to 709 today.  Continue to monitor.  ?PT and OT evaluation. ?Potassium to be repleted.  Magnesium 2.1. ? ?Hyponatremia ?Previously normal.  Likely due to dehydration.  ?Sodium levels low but stable.   ?Urine osmolality is 970.  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  Urine sodium was 43.   ?Fluid restriction.  Salt tablets. ? ?Diabetes ([place]) ?Monitor CBGs.  Continue SSI. ? ? ?Anemia ?Anemia likely multifactorial.  Previously has had acute on chronic blood loss anemia from vaginal bleeding.  She has required blood transfusion. ?Now there is some element of anemia of chronic disease.  Hemoglobin low but stable.  Continue to monitor.  No evidence of overt bleeding.   ? ?Back pain ?Patient with chronic history of back pain but has noticed worsening leg pain as well as leg weakness in the last few days.  Denies any falls in her back.  She is able to move her legs but has diminished strength.  We will go ahead with MRI of the lumbar spine.  She has had spinal fusion surgery previously.  Dr.  Arnoldo Morale in [place] is her neurosurgeon. ? ?GERD (gastroesophageal reflux disease) ?Continue PPI. ? ?Current tobacco use ?Nicotine patch. ? ? ? ?Morbid obesity ?Estimated body mass index is 44.64 kg/m? as calculated from the following: ?  Height as of this encounter: '5\' 7"'$  (1.702 m). ?  Weight as of this encounter: 129.3 kg. ? ? ?  ?DVT Prophylaxis: Subcutaneous heparin ?Code Status: Full code ?Family Communication:  Discussed with patient and her sister who was at the bedside ?Disposition Plan: Inpatient rehabilitation recommended by PT but she is not a good candidate for same per notes from rehab.  Will likely need to go to skilled nursing facility. ? ?Status is: Inpatient ?Remains inpatient appropriate because: Need for IV antibiotics. ? ? ? ? ?Medications: Scheduled: ? acetaminophen  1,000 mg Oral TID  ? azithromycin  500 mg Oral Daily  ? Chlorhexidine Gluconate Cloth  6 each Topical Q0600  ? citalopram  20 mg Oral Daily  ? heparin  5,000 Units Subcutaneous Q8H  ? insulin aspart  0-9 Units Subcutaneous TID WC  ? insulin aspart  2 Units Subcutaneous TID WC  ? LORazepam  0.5 mg Intravenous Once  ? megestrol  40 mg Oral Daily  ? mupirocin ointment  1 application. Nasal BID  ? pantoprazole  40 mg Oral Q1200  ? senna-docusate  2 tablet Oral QHS  ? ?Continuous: ? sodium chloride 50 mL/hr at 03/19/21 8338  ? cefTRIAXone (ROCEPHIN)  IV 2 g (03/19/21 0030)  ? vancomycin    ? vancomycin    ? ?SNK:[id number] **OR** acetaminophen, ALPRAZolam, alum & mag hydroxide-simeth, hydrALAZINE, loperamide, LORazepam, menthol-cetylpyridinium, oxyCODONE, polyethylene glycol ? ?Antibiotics: ?Anti-infectives (From admission, onward)  ? ? Start     Dose/Rate Route Frequency Ordered Stop  ? 03/19/21 2200  vancomycin (VANCOCIN) IVPB 1000 mg/200 mL premix       ? 1,000 mg ?200 mL/hr over 60 Minutes Intravenous Every 12 hours 03/19/21 0950    ? 03/19/21 1100  vancomycin (VANCOREADY) IVPB 2000 mg/400 mL       ? 2,000 mg ?200 mL/hr over 120 Minutes Intravenous  Once 03/19/21 0950    ? 03/18/21 0000  cefTRIAXone (ROCEPHIN) 2 g in sodium chloride 0.9 % 100 mL IVPB       ? 2 g ?200 mL/hr over 30 Minutes Intravenous Every 24 hours 03/17/21 0931 03/24/21 2359  ? 03/17/21 1030  azithromycin (ZITHROMAX) tablet 500 mg       ? 500 mg Oral Daily 03/17/21 0931 03/22/21 0959  ? 03/17/21 0045  cefTRIAXone (ROCEPHIN) 1 g in sodium chloride 0.9 % 100 mL IVPB       ? 1  g ?200 mL/hr over 30 Minutes Intravenous  Once 03/17/21 0030 03/17/21 0115  ? ?  ? ? ?Objective: ? ?Vital Signs ? ?Vitals:  ? 03/18/21 2011 03/19/21 0200 03/19/21 0530 03/19/21 0747  ?BP: (!) 122/42 (!) 123/50 (!) 115/59 (!) 109/28  ?Pulse: 85 84 86 79  ?Resp: '20 18  20  '$ ?Temp: 98 ?F (36.7 ?C) 98.4 ?F (36.9 ?C) 98.8 ?F (37.1 ?C) 99.4 ?F (37.4 ?C)  ?TempSrc: Oral Oral Oral   ?SpO2: 100% 100% 98% 98%  ?Weight:      ?Height:      ? ? ?Intake/Output Summary (Last 24 hours) at 03/19/2021 1004 ?Last data filed at 03/19/2021 0240 ?Gross per 24 hour  ?Intake 1420.84 ml  ?Output --  ?Net 1420.84 ml  ? ?Filed Weights  ? 03/16/21 2315  ?Weight: 129.3 kg  ? ? ?  General appearance: Awake alert.  In no distress ?Resp: Improved effort today.  Few crackles bilateral bases.  No wheezing or rhonchi. ?Cardio: S1-S2 is normal regular.  No S3-S4.  No rubs murmurs or bruit ?GI: Abdomen is obese.  Soft for the most part.  Maceration noted left lower abdomen.  Covered with dressing. ?Extremities: No edema.  Able to move both legs but unable to completely lift them off the bed.  Good passive range of motion of bilateral knees and hips. ?Neurologic: Alert and oriented x3.  No focal neurological deficits.  ? ? ? ?Lab Results: ? ?Data Reviewed: I have personally reviewed labs and imaging study reports ? ?CBC: ?Recent Labs  ?Lab 03/16/21 ?2329 03/17/21 ?1696 03/18/21 ?7893 03/19/21 ?8101  ?WBC 8.4 7.0 5.9 3.4*  ?NEUTROABS 7.6  --   --   --   ?HGB 9.7* 8.7* 9.0* 8.3*  ?HCT 31.1* 27.8* 28.4* 26.1*  ?MCV 87.6 86.1 86.3 84.2  ?PLT 153 137* 125* 120*  ? ? ?Basic Metabolic Panel: ?Recent Labs  ?Lab 03/16/21 ?2329 03/17/21 ?7510 03/18/21 ?2585 03/19/21 ?2778  ?NA 127* 128* 130* 129*  ?K 3.9 3.6 3.2* 3.5  ?CL 96* 99 99 102  ?CO2 19* 18* 20* 19*  ?GLUCOSE 189* 167* 145* 151*  ?BUN '15 14 15 14  '$ ?CREATININE 0.77 0.82 0.75 0.64  ?CALCIUM 8.8* 8.5* 8.7* 8.4*  ?MG 1.8  --  2.1  --   ? ? ?GFR: ?Estimated Creatinine Clearance: 94.2 mL/min (by C-G formula based on  SCr of 0.64 mg/dL). ? ?Liver Function Tests: ?Recent Labs  ?Lab 03/16/21 ?2329 03/17/21 ?0416  ?AST 28 30  ?ALT 14 13  ?ALKPHOS 50 42  ?BILITOT 0.7 0.5  ?PROT 7.8 6.8  ?ALBUMIN 3.4* 3.1*  ? ? ? ?Coagulation Prof

## 2021-03-20 DIAGNOSIS — D649 Anemia, unspecified: Secondary | ICD-10-CM | POA: Diagnosis not present

## 2021-03-20 DIAGNOSIS — R7881 Bacteremia: Secondary | ICD-10-CM | POA: Diagnosis not present

## 2021-03-20 DIAGNOSIS — L03311 Cellulitis of abdominal wall: Secondary | ICD-10-CM | POA: Diagnosis not present

## 2021-03-20 DIAGNOSIS — J189 Pneumonia, unspecified organism: Secondary | ICD-10-CM | POA: Diagnosis not present

## 2021-03-20 LAB — GLUCOSE, CAPILLARY
Glucose-Capillary: 140 mg/dL — ABNORMAL HIGH (ref 70–99)
Glucose-Capillary: 130 mg/dL — ABNORMAL HIGH (ref 70–99)
Glucose-Capillary: 132 mg/dL — ABNORMAL HIGH (ref 70–99)
Glucose-Capillary: 138 mg/dL — ABNORMAL HIGH (ref 70–99)

## 2021-03-20 LAB — BASIC METABOLIC PANEL
Anion gap: 8 (ref 5–15)
BUN: 11 mg/dL (ref 8–23)
CO2: 20 mmol/L — ABNORMAL LOW (ref 22–32)
Calcium: 8.7 mg/dL — ABNORMAL LOW (ref 8.9–10.3)
Chloride: 102 mmol/L (ref 98–111)
Creatinine, Ser: 0.51 mg/dL (ref 0.44–1.00)
GFR, Estimated: >60 mL/min (ref >60)
Glucose, Bld: 140 mg/dL — ABNORMAL HIGH (ref 70–99)
Potassium: 3.6 mmol/L (ref 3.5–5.1)
Sodium: 130 mmol/L — ABNORMAL LOW (ref 135–145)

## 2021-03-20 LAB — CULTURE, BLOOD (ROUTINE X 2): Special Requests: ADEQUATE

## 2021-03-20 LAB — CBC
HCT: 26.9 % — ABNORMAL LOW (ref 36.0–46.0)
Hemoglobin: 8.6 g/dL — ABNORMAL LOW (ref 12.0–15.0)
MCH: 27 pg (ref 26.0–34.0)
MCHC: 32 g/dL (ref 30.0–36.0)
MCV: 84.3 fL (ref 80.0–100.0)
Platelets: 142 10*3/uL — ABNORMAL LOW (ref 150–400)
RBC: 3.19 MIL/uL — ABNORMAL LOW (ref 3.87–5.11)
RDW: 17 % — ABNORMAL HIGH (ref 11.5–15.5)
WBC: 5.2 10*3/uL (ref 4.0–10.5)
nRBC: 0 % (ref 0.0–0.2)

## 2021-03-20 MED ORDER — METHOCARBAMOL 1000 MG/10ML IJ SOLN
500.0000 mg | Freq: Three times a day (TID) | INTRAVENOUS | Status: DC | PRN
  Administered 2021-03-20: 500 mg via INTRAVENOUS
  Filled 2021-03-20 (×2): qty 5

## 2021-03-20 MED ORDER — ALUM & MAG HYDROXIDE-SIMETH 200-200-20 MG/5ML PO SUSP
30.0000 mL | ORAL | Status: DC | PRN
  Administered 2021-03-20 – 2021-03-22 (×4): 30 mL via ORAL
  Filled 2021-03-20 (×4): qty 30

## 2021-03-20 NOTE — Consult Note (Signed)
ID virtual note ? ? ?Cc: staph epi bacteremia ?Attending: [place] Pink ? ?Hpi: ?71 yo female morbidly obese, with endometrial cancer currently undergoing radiation therapy, dm2, htn, hx lumbar spine fusion admitted 3/16 for a few days fatigue, dry cough/sob, found to have imaging suggestion pulm opacity, abd wound, and also 2 successive days of staph epi bacteremia ? ? ?Afebrile on presentation ?No leukocytosis ?Slight thrombocytopenia and stable anemia ?Normal creatinine and lft ?Hiv screen negative ?Covid screen negative ? ? ?Started on CAP coverage ? ? ?3/15 admission bcx staph epi ?3/16 repeat bcx staph epi ? ? ?Also has back pain that is chronic - mri done ?IMPRESSION: ?1. Prior L2-L5 PLIF with interval resolution of the large complex ?fluid collections in the laminectomy bed and posterior subcutaneous ?soft tissues. Resolved mass effect on the thecal sac. ?2. Similar degenerative changes of the lumbar spine as described ?above. 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 ?3. 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 ? ? ? ?A/p ?Cap -- continue tx as you are with ctrx/azithromycin ?Abd wound -- continue wound care ? ?Staph epi bacteremia -- persistent over 2 days. Pending susceptibility profile but suspect true bacteremia ? ?I can't tell from chart if she has any indwelling central line (doesn't appear so) ?She does have lumbar spine hardware and chronic pain. Mri does mention improved/resolved previous surgical site fluid collection so unclear if this area is seeded -- no obvious abscess so unlikely source of bacteremia is from here but complication of seeding is a possibility ? ?Vancomycin was started 3/18 ? ?Would get tte, repeat blood cx, suspect she'll need tee to r/o endocarditis. While rare this bacteremia certainly could be a cause of native valve endocarditis (she has cancer and advance age at risk for valve  seeding). I again can't r/o involvement with the lumbar spine -- probably the only way is to treat brief duration and follow for crp and clinical monitoring ? ? ? ?-finish cap tx 5 day with azith/ceftriaxone ?-repeat blood cx ordered ?-f/u blood cx sensitivity ?-tte ordered ?-continue vanc ?-if tte negative consider tee to r/o endocarditis ?-based on echocardiographic finding, further diagnostics/final duration of abx to be determined ?-discussed with primary team ? ?I am available this weekend for phone question ?Dr Baxter Flattery will be able to do telehealth consult next week if needed ? ? ? ? ? ? ? ?Jabier Mutton, MD ?[facility] ?[place] ?[phone]  pager   [phone] cell ?03/20/2021, 11:35 AM ? ?

## 2021-03-20 NOTE — Progress Notes (Signed)
? ?[facility] ?PROGRESS NOTE ? ? ?Selena Rodgers RCV:[id number] DOB: [birth year] DOA: 03/16/2021  3 ?DOS: the patient was seen and examined on 03/20/2021 ? ?PCP: [name], MD ? ?Brief History and Hospital Course:  ?71 y.o. female with medical history significant of arthritis and endometrial cancer, dm II, HTN.  Patient is currently receiving radiation treatment at the cancer center.  She felt extreme fatigue over the last 48 hours.  Could not go for radiation treatment on the day of admission.  She also mention cough shortness of breath.  She was found to have hyponatremia.  CT scan raised concern for pneumonia.  She was hospitalized for further management.  There was also some concern for cellulitis in her left abdominal pannus.   ? ?Consultants: GYN oncology notified.  Infectious disease to be consulted today. ? ?Procedures: None yet ? ? ? ?Subjective: ?Patient continues to have muscle cramps/spasms in the lower extremities right greater than left.  Also has low back pain.  Shortness of breath and cough has improved.  No new complaints offered.   ? ? ?Assessment/Plan: ? ? ?* CAP (community acquired pneumonia) ?Patient presented with generalized weakness cough shortness of breath.  Chest x-ray did not show any infiltrate but CT scan did raise concern for pneumonia.  ?Required oxygen briefly but now saturating well on room air. ?Remains afebrile.  WBC is normal. ?Continue ceftriaxone and azithromycin for now. ?Respiratory status has improved. ? ?Bacteremia ?BC ID did turn positive for Staph epidermidis.  Repeat set also reveals Staph epidermidis.  Patient started on vancomycin.   ?Sensitivities reviewed.  Will request ID consult this patient.   ? ?Cellulitis of abdominal wall ?Patient has maceration of the skin over the left abdomen/pannus.  Erythema was noted suggesting cellulitis.  No clear abscess. ?Seen by wound care.  Wound care dressing changes to be performed. ?Continue antibiotics as discussed  above.   ? ?Endometrial cancer ([place]) ?Has been receiving radiation treatment.  Supposed to end March 29.  ?Oncology was notified of admission.  Radiation treatments on hold.  ?No evidence for vaginal bleeding. ? ?Generalized weakness ?Hypokalemia ? ?Likely due to dehydration, pneumonia, anemia.  Vital signs are stable.  Lactic acid level was normal.  CK was noted to be elevated at 819, improved to 709.  ?Needs a skilled nursing facility for rehabilitation per physical therapy. ?Potassium repleted. ? ?Hyponatremia ?Previously normal.  Likely due to dehydration.  ?Sodium levels low but stable.   ?Urine osmolality is 970.  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  Urine sodium was 43.   ?Continue with fluid restriction and salt tablets.  Sodium level is stable. ? ?Diabetes ([place]) ?Monitor CBGs.  Continue SSI. ? ? ?Anemia ?Anemia likely multifactorial.  Previously has had acute on chronic blood loss anemia from vaginal bleeding.  She has required blood transfusion. ?Now there is some element of anemia of chronic disease.  Hemoglobin low but stable.  Continue to monitor.  No evidence of overt bleeding.   ? ?Back pain ?Patient with chronic history of back pain but has noticed worsening leg pain as well as leg weakness in the last few days.  Denies any falls in her back.  She is able to move her legs but has diminished strength.   ?She has had spinal fusion surgery previously.  [name] in [place] is her neurosurgeon. ?MRI of the lumbar spine was done yesterday which does show significant disease but no different from previous MRI which was done in 2021.  Patient and sister reassured.  Continue to monitor.  Physical therapy.  Pain control.   ? ? ?GERD (gastroesophageal reflux disease) ?Continue PPI. ? ?Current tobacco use ?Nicotine patch. ? ? ? ?Morbid obesity ?Estimated body mass index is 44.64 kg/m? as calculated from the following: ?  Height as of this encounter: '5\' 7"'$  (1.702 m). ?  Weight as of this encounter: 129.3  kg. ? ? ?  ?DVT Prophylaxis: Subcutaneous heparin ?Code Status: Full code ?Family Communication: Discussed with patient ?Disposition Plan: Inpatient rehabilitation recommended by PT but she is not a good candidate for same per notes from rehab.  Will likely need to go to skilled nursing facility. ? ?Status is: Inpatient ?Remains inpatient appropriate because: Need for IV antibiotics. ? ? ? ? ?Medications: Scheduled: ? acetaminophen  1,000 mg Oral TID  ? azithromycin  500 mg Oral Daily  ? Chlorhexidine Gluconate Cloth  6 each Topical Q0600  ? citalopram  20 mg Oral Daily  ? heparin  5,000 Units Subcutaneous Q8H  ? insulin aspart  0-9 Units Subcutaneous TID WC  ? insulin aspart  2 Units Subcutaneous TID WC  ? megestrol  40 mg Oral Daily  ? mupirocin ointment  1 application. Nasal BID  ? pantoprazole  40 mg Oral Q1200  ? senna-docusate  2 tablet Oral QHS  ? sodium chloride  1 g Oral BID WC  ? ?Continuous: ? cefTRIAXone (ROCEPHIN)  IV 2 g (03/20/21 0000)  ? methocarbamol (ROBAXIN) IV    ? vancomycin 1,000 mg (03/20/21 0826)  ? ?PTW:[id number] **OR** acetaminophen, ALPRAZolam, alum & mag hydroxide-simeth, hydrALAZINE, loperamide, LORazepam, menthol-cetylpyridinium, methocarbamol (ROBAXIN) IV, oxyCODONE, polyethylene glycol ? ?Antibiotics: ?Anti-infectives (From admission, onward)  ? ? Start     Dose/Rate Route Frequency Ordered Stop  ? 03/19/21 2200  vancomycin (VANCOCIN) IVPB 1000 mg/200 mL premix       ? 1,000 mg ?200 mL/hr over 60 Minutes Intravenous Every 12 hours 03/19/21 0950    ? 03/19/21 1100  vancomycin (VANCOREADY) IVPB 2000 mg/400 mL       ? 2,000 mg ?200 mL/hr over 120 Minutes Intravenous  Once 03/19/21 0950 03/19/21 1355  ? 03/18/21 0000  cefTRIAXone (ROCEPHIN) 2 g in sodium chloride 0.9 % 100 mL IVPB       ? 2 g ?200 mL/hr over 30 Minutes Intravenous Every 24 hours 03/17/21 0931 03/24/21 2359  ? 03/17/21 1030  azithromycin (ZITHROMAX) tablet 500 mg       ? 500 mg Oral Daily 03/17/21 0931 03/22/21 0959  ?  03/17/21 0045  cefTRIAXone (ROCEPHIN) 1 g in sodium chloride 0.9 % 100 mL IVPB       ? 1 g ?200 mL/hr over 30 Minutes Intravenous  Once 03/17/21 0030 03/17/21 0115  ? ?  ? ? ?Objective: ? ?Vital Signs ? ?Vitals:  ? 03/19/21 1807 03/19/21 2001 03/20/21 0422 03/20/21 0804  ?BP: (!) 111/51 (!) 124/42 (!) 147/67 124/81  ?Pulse: 72 69 72 78  ?Resp: '20 20 19 19  '$ ?Temp: 98.2 ?F (36.8 ?C) 97.9 ?F (36.6 ?C) 97.9 ?F (36.6 ?C) 98 ?F (36.7 ?C)  ?TempSrc:  Oral Oral   ?SpO2: 96% 98% 99% 99%  ?Weight:      ?Height:      ? ? ?Intake/Output Summary (Last 24 hours) at 03/20/2021 0920 ?Last data filed at 03/20/2021 0400 ?Gross per 24 hour  ?Intake 1380.31 ml  ?Output 650 ml  ?Net 730.31 ml  ? ? ?Filed Weights  ? 03/16/21 2315  ?Weight:  129.3 kg  ? ? ?General appearance: Awake alert.  In no distress ?Resp: Improved effort.  Improved air entry bilaterally.  No definite crackles appreciated.  No wheezing. ?Cardio: S1-S2 is normal regular.  No S3-S4.  No rubs murmurs or bruit ?GI: Abdomen is s obese.  Denuded skin and macerated appearing skin noted in the left lower abdomen.   ?Extremities: No edema.  Restricted range of motion of the lower extremities. ?Neurologic: Alert and oriented x3.  No focal neurological deficits.  ? ? ? ? ?Lab Results: ? ?Data Reviewed: I have personally reviewed labs and imaging study reports ? ?CBC: ?Recent Labs  ?Lab 03/16/21 ?2329 03/17/21 ?2595 03/18/21 ?6387 03/19/21 ?5643 03/20/21 ?3295  ?WBC 8.4 7.0 5.9 3.4* 5.2  ?NEUTROABS 7.6  --   --   --   --   ?HGB 9.7* 8.7* 9.0* 8.3* 8.6*  ?HCT 31.1* 27.8* 28.4* 26.1* 26.9*  ?MCV 87.6 86.1 86.3 84.2 84.3  ?PLT 153 137* 125* 120* 142*  ? ? ? ?Basic Metabolic Panel: ?Recent Labs  ?Lab 03/16/21 ?2329 03/17/21 ?1884 03/18/21 ?1660 03/19/21 ?6301 03/20/21 ?6010  ?NA 127* 128* 130* 129* 130*  ?K 3.9 3.6 3.2* 3.5 3.6  ?CL 96* 99 99 102 102  ?CO2 19* 18* 20* 19* 20*  ?GLUCOSE 189* 167* 145* 151* 140*  ?BUN '15 14 15 14 11  '$ ?CREATININE 0.77 0.82 0.75 0.64 0.51  ?CALCIUM 8.8*  8.5* 8.7* 8.4* 8.7*  ?MG 1.8  --  2.1  --   --   ? ? ? ?GFR: ?Estimated Creatinine Clearance: 94.2 mL/min (by C-G formula based on SCr of 0.51 mg/dL). ? ?Liver Function Tests: ?Recent Labs  ?Lab 03/16/21

## 2021-03-21 ENCOUNTER — Ambulatory Visit: Payer: Medicare HMO

## 2021-03-21 ENCOUNTER — Inpatient Hospital Stay
Admit: 2021-03-21 | Discharge: 2021-03-21 | Disposition: A | Discharge status: Home / Self Care | Payer: Medicare HMO | Attending: Internal Medicine | Admitting: Internal Medicine

## 2021-03-21 DIAGNOSIS — J189 Pneumonia, unspecified organism: Secondary | ICD-10-CM | POA: Diagnosis not present

## 2021-03-21 DIAGNOSIS — R7881 Bacteremia: Secondary | ICD-10-CM | POA: Diagnosis not present

## 2021-03-21 DIAGNOSIS — D649 Anemia, unspecified: Secondary | ICD-10-CM | POA: Diagnosis not present

## 2021-03-21 DIAGNOSIS — L03311 Cellulitis of abdominal wall: Secondary | ICD-10-CM | POA: Diagnosis not present

## 2021-03-21 LAB — CBC
HCT: 28.3 % — ABNORMAL LOW (ref 36.0–46.0)
Hemoglobin: 9.2 g/dL — ABNORMAL LOW (ref 12.0–15.0)
MCH: 27 pg (ref 26.0–34.0)
MCHC: 32.5 g/dL (ref 30.0–36.0)
MCV: 83 fL (ref 80.0–100.0)
Platelets: 149 10*3/uL — ABNORMAL LOW (ref 150–400)
RBC: 3.41 MIL/uL — ABNORMAL LOW (ref 3.87–5.11)
RDW: 17 % — ABNORMAL HIGH (ref 11.5–15.5)
WBC: 6.6 10*3/uL (ref 4.0–10.5)
nRBC: 0 % (ref 0.0–0.2)

## 2021-03-21 LAB — BASIC METABOLIC PANEL
Anion gap: 8 (ref 5–15)
BUN: 12 mg/dL (ref 8–23)
CO2: 21 mmol/L — ABNORMAL LOW (ref 22–32)
Calcium: 8.7 mg/dL — ABNORMAL LOW (ref 8.9–10.3)
Chloride: 100 mmol/L (ref 98–111)
Creatinine, Ser: 0.52 mg/dL (ref 0.44–1.00)
GFR, Estimated: >60 mL/min (ref >60)
Glucose, Bld: 134 mg/dL — ABNORMAL HIGH (ref 70–99)
Potassium: 3.5 mmol/L (ref 3.5–5.1)
Sodium: 129 mmol/L — ABNORMAL LOW (ref 135–145)

## 2021-03-21 LAB — GLUCOSE, CAPILLARY
Glucose-Capillary: 130 mg/dL — ABNORMAL HIGH (ref 70–99)
Glucose-Capillary: 126 mg/dL — ABNORMAL HIGH (ref 70–99)
Glucose-Capillary: 120 mg/dL — ABNORMAL HIGH (ref 70–99)
Glucose-Capillary: 128 mg/dL — ABNORMAL HIGH (ref 70–99)

## 2021-03-21 MED ORDER — POTASSIUM CHLORIDE CRYS ER 20 MEQ PO TBCR
40.0000 meq | EXTENDED_RELEASE_TABLET | Freq: Once | ORAL | Status: AC
Start: 2021-03-21 — End: 2021-03-21
  Administered 2021-03-21: 40 meq via ORAL
  Filled 2021-03-21: qty 2

## 2021-03-21 NOTE — Progress Notes (Signed)
? ?[facility] ?PROGRESS NOTE ? ? ?Selena Rodgers WPY:[id number] DOB: Dec 17, 1950 DOA: 03/16/2021  4 ?DOS: the patient was seen and examined on 03/21/2021 ? ?PCP: [name], MD ? ?Brief History and Hospital Course:  ?71 y.o. female with medical history significant of arthritis and endometrial cancer, dm II, HTN.  Patient is receiving radiation treatment at the cancer center.  She felt extreme fatigued at home.  Could not go for radiation treatment on the day of admission.  She also mentioned cough shortness of breath.  She was found to have hyponatremia.  CT scan raised concern for pneumonia.  She was hospitalized for further management.  There was also some concern for cellulitis in her left abdominal pannus.  Started on antibiotics.  Found to have Staph epidermidis bacteremia.  Initially thought to be contaminant but repeat blood cultures also showed similar organism.  ID was consulted.  On vancomycin. ? ?Consultants:  ?GYN oncology notified.   ?Infectious disease ? ?Procedures: Transthoracic echocardiogram ? ? ?Subjective: ?Patient continues to feel poorly overall due to back issues and muscle cramps.  Cough is better.  Passed 2 small clots vaginally yesterday.  Denies any abdominal pain.   ? ? ?Assessment/Plan: ? ? ?* CAP (community acquired pneumonia) ?Patient presented with generalized weakness cough shortness of breath.  Chest x-ray did not show any infiltrate but CT scan did raise concern for pneumonia.  ?Required oxygen briefly. ?Remains afebrile.  WBC is normal. ?Ceftriaxone and azithromycin for 5 days.  Respiratory status has improved.  Occasional cough.  Saturating normal on room air. ? ?Bacteremia ?BC ID did turn positive for Staph epidermidis.  Repeat set also reveals Staph epidermidis.  Patient started on vancomycin.   ?Sensitivities reviewed.   ?ID was consulted.  Continue vancomycin.  Transthoracic echocardiogram has been ordered.  If thoracic echocardiogram is negative for  endocarditis, ID recommends transesophageal echocardiogram. ? ?Cellulitis of abdominal wall ?Patient has maceration of the skin over the left abdomen/pannus.  Erythema was noted suggesting cellulitis.  No clear abscess. ?Seen by wound care.  Wound care dressing changes to be performed. ?Continue vancomycin.  Erythema appears to have improved.  Continue local wound care. ? ?Endometrial cancer ([place]) ?Has been receiving radiation treatment.  Supposed to end March 29.  ?Oncology was notified of admission.  Radiation treatments on hold.  ?Patient mentioned that she passed 2 small clots vaginally yesterday.  Hemoglobin stable. ? ?Generalized weakness ?Hypokalemia ? ?Likely due to dehydration, pneumonia, anemia.  Vital signs are stable.  Lactic acid level was normal.  CK was noted to be elevated at 819, improved to 709.  ?Needs a skilled nursing facility for rehabilitation per physical therapy. ?Continue to replete potassium ? ?Hyponatremia ?Previously normal.  Likely due to dehydration.  ?Sodium levels low but stable.   ?Urine osmolality is 970.  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  Urine sodium was 43.   ?Continue with fluid restriction and salt tablets.  Sodium level is stable. ? ?Diabetes ([place]) ?Monitor CBGs.  Continue SSI. ? ? ?Anemia ?Anemia likely multifactorial.  Previously has had acute on chronic blood loss anemia from vaginal bleeding.  She has required blood transfusion. ?Now there is some element of anemia of chronic disease.  Hemoglobin low but stable.  Continue to monitor.  ? ?Back pain ?Patient with chronic history of back pain but has noticed worsening leg pain as well as leg weakness in the last few days.  Denies any falls in her back.  She is able to  move her legs but has diminished strength.   ?She has had spinal fusion surgery previously.  [name] in [place] is her neurosurgeon. ?MRI of the lumbar spine was done on 3/18 which does show significant disease but no different from previous MRI which  was done in 2021.  Patient and sister reassured.  Continue to monitor.  Physical therapy.  Pain control.   ? ? ?GERD (gastroesophageal reflux disease) ?Continue PPI. ? ?Current tobacco use ?Nicotine patch. ? ? ? ?Morbid obesity ?Estimated body mass index is 44.64 kg/m? as calculated from the following: ?  Height as of this encounter: '5\' 7"'$  (1.702 m). ?  Weight as of this encounter: 129.3 kg. ? ? ?  ?DVT Prophylaxis: Subcutaneous heparin ?Code Status: Full code ?Family Communication: Discussed with patient ?Disposition Plan: Inpatient rehabilitation recommended by PT but she is not a good candidate for same per notes from rehab.  Will likely need to go to skilled nursing facility. ? ?Status is: Inpatient ?Remains inpatient appropriate because: Need for IV antibiotics. ? ? ? ? ?Medications: Scheduled: ? acetaminophen  1,000 mg Oral TID  ? Chlorhexidine Gluconate Cloth  6 each Topical Q0600  ? citalopram  20 mg Oral Daily  ? heparin  5,000 Units Subcutaneous Q8H  ? insulin aspart  0-9 Units Subcutaneous TID WC  ? insulin aspart  2 Units Subcutaneous TID WC  ? megestrol  40 mg Oral Daily  ? mupirocin ointment  1 application. Nasal BID  ? pantoprazole  40 mg Oral Q1200  ? senna-docusate  2 tablet Oral QHS  ? sodium chloride  1 g Oral BID WC  ? ?Continuous: ? methocarbamol (ROBAXIN) IV Stopped (03/20/21 1057)  ? vancomycin 1,000 mg (03/21/21 0914)  ? ?RXV:[id number] **OR** acetaminophen, ALPRAZolam, alum & mag hydroxide-simeth, hydrALAZINE, loperamide, LORazepam, menthol-cetylpyridinium, methocarbamol (ROBAXIN) IV, oxyCODONE, polyethylene glycol ? ?Antibiotics: ?Anti-infectives (From admission, onward)  ? ? Start     Dose/Rate Route Frequency Ordered Stop  ? 03/19/21 2200  vancomycin (VANCOCIN) IVPB 1000 mg/200 mL premix       ? 1,000 mg ?200 mL/hr over 60 Minutes Intravenous Every 12 hours 03/19/21 0950    ? 03/19/21 1100  vancomycin (VANCOREADY) IVPB 2000 mg/400 mL       ? 2,000 mg ?200 mL/hr over 120 Minutes  Intravenous  Once 03/19/21 0950 03/19/21 1355  ? 03/18/21 0000  cefTRIAXone (ROCEPHIN) 2 g in sodium chloride 0.9 % 100 mL IVPB  Status:  Discontinued       ? 2 g ?200 mL/hr over 30 Minutes Intravenous Every 24 hours 03/17/21 0931 03/21/21 1020  ? 03/17/21 1030  azithromycin (ZITHROMAX) tablet 500 mg       ? 500 mg Oral Daily 03/17/21 0931 03/21/21 0854  ? 03/17/21 0045  cefTRIAXone (ROCEPHIN) 1 g in sodium chloride 0.9 % 100 mL IVPB       ? 1 g ?200 mL/hr over 30 Minutes Intravenous  Once 03/17/21 0030 03/17/21 0115  ? ?  ? ? ?Objective: ? ?Vital Signs ? ?Vitals:  ? 03/20/21 1822 03/20/21 2010 03/21/21 0532 03/21/21 0715  ?BP: (!) 127/55 (!) 98/49 138/88 (!) 102/52  ?Pulse: 76 75 78 74  ?Resp: '20 18 18 20  '$ ?Temp: 97.6 ?F (36.4 ?C) 97.7 ?F (36.5 ?C) 97.7 ?F (36.5 ?C) 97.6 ?F (36.4 ?C)  ?TempSrc: Oral Oral Oral Oral  ?SpO2: 97% 99% 96% 99%  ?Weight:      ?Height:      ? ? ?Intake/Output Summary (Last 24  hours) at 03/21/2021 1030 ?Last data filed at 03/21/2021 1011 ?Gross per 24 hour  ?Intake 166.76 ml  ?Output 800 ml  ?Net -633.24 ml  ? ? ?Filed Weights  ? 03/16/21 2315  ?Weight: 129.3 kg  ? ? ?General appearance: Awake alert.  In no distress ?Resp: Improved air entry bilaterally.  No definite crackles wheezing or rhonchi. ?Cardio: S1-S2 is normal regular.  No S3-S4.  No rubs murmurs or bruit ?GI: Abdomen is obese.  Mostly nontender.  No obvious masses organomegaly.  Large hernia noted.   ?Skin over the left lower abdomen shows improved erythema.  Has wounds covered with dressing. ?Extremities: No edema.  Limited range of motion of both lower extremities due to chronic back pain ?Neurologic: Alert and oriented x3.  No focal neurological deficits.  ? ? ? ? ? ?Lab Results: ? ?Data Reviewed: I have personally reviewed labs and imaging study reports ? ?CBC: ?Recent Labs  ?Lab 03/16/21 ?2329 03/17/21 ?5176 03/18/21 ?1607 03/19/21 ?3710 03/20/21 ?6269 03/21/21 ?0423  ?WBC 8.4 7.0 5.9 3.4* 5.2 6.6  ?NEUTROABS 7.6  --   --    --   --   --   ?HGB 9.7* 8.7* 9.0* 8.3* 8.6* 9.2*  ?HCT 31.1* 27.8* 28.4* 26.1* 26.9* 28.3*  ?MCV 87.6 86.1 86.3 84.2 84.3 83.0  ?PLT 153 137* 125* 120* 142* 149*  ? ? ? ?Basic Metabolic Panel: ?Recent Labs  ?Lab 03/15/2

## 2021-03-21 NOTE — Progress Notes (Signed)
?  [facility]   ? ?Date of Admission:  03/16/2021   Total days of antibiotics 6 ?        ? ?ID: Selena Rodgers is a 71 y.o. female with morbidly obese, with endometrial cancer currently undergoing radiation therapy, dm2, htn, hx lumbar spine fusion admitted 3/16 for a few days fatigue, dry cough/sob, found to have imaging suggestion pulm opacity, abd wound, and also 2 successive days of staph epi bacteremia ?Principal Problem: ?  CAP (community acquired pneumonia) ?Active Problems: ?  Back pain ?  Current tobacco use ?  Generalized weakness ?  Endometrial cancer ([place]) ?  Hyponatremia ?  Anemia ?  Diabetes ([place]) ?  GERD (gastroesophageal reflux disease) ?  Cellulitis of abdominal wall ?  Cellulitis ?  Bacteremia ? ? ? ?Subjective: ?Still feeling poorly. Afebrile but occasional chills. Feels like she has some abdominal distention ? ?Medications:  ? acetaminophen  1,000 mg Oral TID  ? Chlorhexidine Gluconate Cloth  6 each Topical Q0600  ? citalopram  20 mg Oral Daily  ? heparin  5,000 Units Subcutaneous Q8H  ? insulin aspart  0-9 Units Subcutaneous TID WC  ? insulin aspart  2 Units Subcutaneous TID WC  ? megestrol  40 mg Oral Daily  ? mupirocin ointment  1 application. Nasal BID  ? pantoprazole  40 mg Oral Q1200  ? senna-docusate  2 tablet Oral QHS  ? sodium chloride  1 g Oral BID WC  ? ? ?Objective: ?Vital signs in last 24 hours: ?Temp:  [97.6 ?F (36.4 ?C)-98 ?F (36.7 ?C)] 98 ?F (36.7 ?C) (03/20 1631) ?Pulse Rate:  [74-78] 75 (03/20 1631) ?Resp:  [16-20] 16 (03/20 1631) ?BP: (98-138)/(49-88) 106/51 (03/20 1631) ?SpO2:  [96 %-99 %] 97 % (03/20 1631) ?Physical Exam  ?Constitutional:  oriented to person, place, and time. appears chronically ill and well-nourished. No distress.  ?HENT: [place]/AT, PERRLA, no scleral icterus ?Mouth/Throat: Oropharynx is clear and moist. No oropharyngeal exudate.  ?Cardiovascular: Normal rate, regular rhythm and normal heart sounds. Exam reveals no gallop and no  friction rub.  ?No murmur heard.  ?Pulmonary/Chest: Effort normal and breath sounds normal. No respiratory distress.  has no wheezes.  ?Neck = supple, no nuchal rigidity ?Abdominal: Soft. Bowel sounds are normal.  exhibits no distension. There is no tenderness. Large pannus with scattered skin ulcer.  ?Lymphadenopathy: no cervical adenopathy. No axillary adenopathy ?Neurological: alert and oriented to person, place, and time.  ?Skin: Skin is warm and dry. No rash noted. No erythema.  ?Psychiatric: a normal mood and affect.  behavior is normal.  ? ? ?Lab Results ?Recent Labs  ?  03/20/21 ?8786 03/21/21 ?0423  ?WBC 5.2 6.6  ?HGB 8.6* 9.2*  ?HCT 26.9* 28.3*  ?NA 130* 129*  ?K 3.6 3.5  ?CL 102 100  ?CO2 20* 21*  ?BUN 11 12  ?CREATININE 0.51 0.52  ? ? ? ?Microbiology: ?Staphylococcus epidermidis     ? MIC   ? CIPROFLOXACIN >=8 RESISTANT  Resistant   ? CLINDAMYCIN >=8 RESISTANT  Resistant   ? ERYTHROMYCIN >=8 RESISTANT  Resistant   ? GENTAMICIN <=0.5 SENSI... Sensitive   ? Inducible Clindamycin NEGATIVE  Sensitive   ? OXACILLIN >=4 RESISTANT  Resistant   ? RIFAMPIN <=0.5 SENSI... Sensitive   ? TETRACYCLINE 2 SENSITIVE  Sensitive   ? TRIMETH/SULFA 80 RESISTANT  Resistant   ? VANCOMYCIN 1 SENSITIVE  Sensitive   ? ?Studies/Results: ?MR LUMBAR SPINE WO CONTRAST ? ?Result Date:  03/19/2021 ?CLINICAL DATA:  Worsening leg pain and leg weakness over the past few days. Chronic low back pain. History of prior fusion. EXAM: MRI LUMBAR SPINE WITHOUT CONTRAST TECHNIQUE: Multiplanar, multisequence MR imaging of the lumbar spine was performed. No intravenous contrast was administered. COMPARISON:  Lumbar spine x-rays dated October 19, 2020. MRI lumbar spine dated July 13, 2019. FINDINGS: Segmentation:  Similar partial lumbarization of S1. Alignment: Unchanged levoscoliosis. Unchanged trace L1-L2 retrolisthesis. Unchanged mild L4-L5 and trace L5-S1 anterolisthesis. Vertebrae:  No fracture, evidence of discitis, or bone lesion. Conus  medullaris and cauda equina: Conus extends to the L1-L2 level. Conus and cauda equina appear normal. Paraspinal and other soft tissues: Previously seen large complex fluid collections in the laminectomy bed and posterior subcutaneous soft tissues have resolved. Disc levels: T12-L1: Similar severe left neuroforaminal stenosis due to facet arthropathy. L1-L2: Unchanged mild spinal canal and moderate to severe bilateral neuroforaminal stenosis due to disc bulging, right far lateral disc osteophyte complex, and advanced facet arthropathy. L2-L3: Prior posterior decompression and PLIF. Unchanged residual mild bilateral neuroforaminal stenosis. No spinal canal stenosis. L3-L4: Prior posterior decompression and PLIF. Unchanged residual mild bilateral neuroforaminal stenosis. No spinal canal stenosis. L4-L5: Prior posterior decompression and PLIF. Unchanged residual moderate bilateral neuroforaminal stenosis. No spinal canal stenosis. L5-S1: Negative disc. Suspected facet joint ankylosis. No stenosis. IMPRESSION: 1. Prior L2-L5 PLIF with interval resolution of the large complex fluid collections in the laminectomy bed and posterior subcutaneous soft tissues. Resolved mass effect on the thecal sac. 2. Similar degenerative changes of the lumbar spine as described above. 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 3. 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 Electronically Signed   By: Selena Rodgers M.D.   On: 03/19/2021 17:28   ? ? ?Assessment/Plan: ?MRSE bacteremia = unclear where source of infection if cellulitis since she does have dry skin of pannus with scattered skin ulcers but no overt cellulitis. Continue on vancomycin for now. ? ?Recommend to get TEE if possible to rule out endocarditis, vs. Treat as complicated bacteremia with prolonged course of IV abtx ? ?Repeat blood cx on 3/19 NGTD at 24hrs. ? ? ? ?Selena Rodgers ?[facility] ?Pager: [phone] ? ?03/21/2021, 4:56 PM ? ? ? ? ? ?

## 2021-03-21 NOTE — Progress Notes (Signed)
*  PRELIMINARY RESULTS* ?Echocardiogram ?2D Echocardiogram has been performed. ? ?Damonica Chopra, [name] ?03/21/2021, 8:41 AM ?

## 2021-03-21 NOTE — Progress Notes (Signed)
Physical Therapy Treatment ?Patient Details ?Name: Selena Rodgers ?MRN: [id number] ?DOB: [birth year] ?Today's Date: 03/21/2021 ? ? ?History of Present Illness Pt is a 71 y.o. female presenting to hospital 3/16 for evaluation of weakness (slipped forward out of recliner and unable to get back up).  Pt admitted with generalized weakness, CAP, anemia, SOB, hyponatremia, and endometrial CA (currently receiving treatment).  Concern for cellulitis in L abdominal pannus.  Pt with large pendulous hernia of LLQ of abdomen.  PMH includes endometrial CA (daily radiation), DM, htn, anxiety, chronic back pain, spinal stenosis, uterine CA, ventral and umbilical hernia repair, ACDF, CTR, and foot surgery. ? ?  ?PT Comments  ? ? Pt laying on her L side in bed upon PT arrival to room; pt reports not feeling well today and had recent pain meds; pt agreeable to trying some therapy in bed.  Attempted to logroll pt onto her back for LE ex's in bed but pt only able to partially turn with assist (pt still in partial L sidelying).  Attempted R LE AAROM (hip and knee flexion/extension to improve strength, ROM, and attempt to decrease cramping sensation) but pt only able to tolerate 14 reps x2 trials (rest break between d/t discomfort and SOB).  Pt reports just not having a good day and requesting to lay back on L side to rest (therapist deferred further activity).  Per notes, pt not approved for CIR; PT recommendations updated to SNF. ?   ?Recommendations for follow up therapy are one component of a multi-disciplinary discharge planning process, led by the attending physician.  Recommendations may be updated based on patient status, additional functional criteria and insurance authorization. ? ?Follow Up Recommendations ? Skilled nursing-short term rehab (<3 hours/day) ?  ?  ?Assistance Recommended at Discharge Frequent or constant Supervision/Assistance  ?Patient can return home with the following Two people to help with walking and/or  transfers;Two people to help with bathing/dressing/bathroom;Assistance with cooking/housework;Assist for transportation;Help with stairs or ramp for entrance ?  ?Equipment Recommendations ? Rolling walker (2 wheels);Wheelchair (measurements PT);Wheelchair cushion (measurements PT);Hospital bed (bariatric DME; hoyer lift)  ?  ?Recommendations for Other Services OT consult ? ? ?  ?Precautions / Restrictions Precautions ?Precautions: Fall ?Precaution Comments: Large pendulous hernia of LLQ of abdomen ?Restrictions ?Weight Bearing Restrictions: No ?Other Position/Activity Restrictions: Pt wears specialized shoes whenever out of bed  ?  ? ?Mobility ? Bed Mobility ?Overal bed mobility: Needs Assistance ?Bed Mobility: Rolling ?Rolling: Max assist ?  ?  ?  ?  ?General bed mobility comments: assist for partially logrolling from L sidelying towards supine (but pt unable to lay on back d/t discomfort) and max assist to logroll back to L sidelying (using bed pad) ?  ? ?Transfers ?  ?  ?  ?  ?  ?  ?  ?  ?  ?  ?  ? ?Ambulation/Gait ?  ?  ?  ?  ?  ?  ?  ?  ? ? ?Stairs ?  ?  ?  ?  ?  ? ? ?Wheelchair Mobility ?  ? ?Modified Rankin (Stroke Patients Only) ?  ? ? ?  ?Balance   ?  ?  ?  ?  ?  ?  ?  ?  ?  ?  ?  ?  ?  ?  ?  ?  ?  ?  ?  ? ?  ?Cognition Arousal/Alertness: Awake/alert ?Behavior During Therapy: [facility] for tasks assessed/performed ?Overall Cognitive Status: Within Functional Limits for  tasks assessed ?  ?  ?  ?  ?  ?  ?  ?  ?  ?  ?  ?  ?  ?  ?  ?  ?  ?  ?  ? ?  ?Exercises Total Joint Exercises ?Ankle Circles/Pumps: AROM, Strengthening, Both, 10 reps (partial L sidelying) ?Other Exercises ?Other Exercises: R LE hip and knee flexion and extension (limited extension ROM more in hip than knee) AAROM per pt tolerance d/t c/o cramping pain (14 reps x2); performed in partial L sidelying ? ?  ?General Comments General comments (skin integrity, edema, etc.): Large LLQ hernia noted.  Nursing cleared pt for participation in physical  therapy.  Pt agreeable to limited PT session. ?  ?  ? ?Pertinent Vitals/Pain Pain Assessment ?Pain Assessment: Faces ?Faces Pain Scale: Hurts little more ?Pain Location: R low back/buttock pain ?Pain Descriptors / Indicators: Cramping ?Pain Intervention(s): Limited activity within patient's tolerance, Monitored during session, Repositioned ?Vitals (HR and O2 on room air) stable and WFL throughout treatment session.  ? ? ?Home Living   ?  ?  ?  ?  ?  ?  ?  ?  ?  ?   ?  ?Prior Function    ?  ?  ?   ? ?PT Goals (current goals can now be found in the care plan section) Acute Rehab PT Goals ?Patient Stated Goal: to improve strength and mobility ?PT Goal Formulation: With patient ?Time For Goal Achievement: 04/01/21 ?Potential to Achieve Goals: Fair ?Progress towards PT goals: Progressing toward goals ? ?  ?Frequency ? ? ? Min 2X/week ? ? ? ?  ?PT Plan Current plan remains appropriate  ? ? ?Co-evaluation   ?  ?  ?  ?  ? ?  ?AM-PAC PT "6 Clicks" Mobility   ?Outcome Measure ? Help needed turning from your back to your side while in a flat bed without using bedrails?: A Lot ?Help needed moving from lying on your back to sitting on the side of a flat bed without using bedrails?: A Lot ?Help needed moving to and from a bed to a chair (including a wheelchair)?: Total ?Help needed standing up from a chair using your arms (e.g., wheelchair or bedside chair)?: Total ?Help needed to walk in hospital room?: Total ?Help needed climbing 3-5 steps with a railing? : Total ?6 Click Score: 8 ? ?  ?End of Session Equipment Utilized During Treatment: Gait belt ?Activity Tolerance: Patient limited by fatigue (and just not feeling well in general) ?Patient left: in bed;with call bell/phone within reach;with nursing/sitter in room (in L sidelying per pt comfort) ?Nurse Communication: Mobility status;Precautions (pt received recent pain medication and reports she will notify nursing if she needs additional pain meds) ?PT Visit Diagnosis: Other  abnormalities of gait and mobility (R26.89);Muscle weakness (generalized) (M62.81);History of falling (Z91.81);Pain ?  ? ? ?Time: 3734-2876 ?PT Time Calculation (min) (ACUTE ONLY): 19 min ? ?Charges:  $Therapeutic Exercise: 8-22 mins          ?          ?Leitha Bleak, PT ?03/21/21, 11:59 AM ? ? ?

## 2021-03-22 ENCOUNTER — Ambulatory Visit: Payer: Medicare HMO

## 2021-03-22 DIAGNOSIS — L03311 Cellulitis of abdominal wall: Secondary | ICD-10-CM | POA: Diagnosis not present

## 2021-03-22 DIAGNOSIS — J189 Pneumonia, unspecified organism: Secondary | ICD-10-CM | POA: Diagnosis not present

## 2021-03-22 DIAGNOSIS — D649 Anemia, unspecified: Secondary | ICD-10-CM | POA: Diagnosis not present

## 2021-03-22 DIAGNOSIS — R7881 Bacteremia: Secondary | ICD-10-CM | POA: Diagnosis not present

## 2021-03-22 LAB — CBC
HCT: 29.1 % — ABNORMAL LOW (ref 36.0–46.0)
Hemoglobin: 9.4 g/dL — ABNORMAL LOW (ref 12.0–15.0)
MCH: 26.6 pg (ref 26.0–34.0)
MCHC: 32.3 g/dL (ref 30.0–36.0)
MCV: 82.2 fL (ref 80.0–100.0)
Platelets: 245 10*3/uL (ref 150–400)
RBC: 3.54 MIL/uL — ABNORMAL LOW (ref 3.87–5.11)
RDW: 17.1 % — ABNORMAL HIGH (ref 11.5–15.5)
WBC: 8.4 10*3/uL (ref 4.0–10.5)
nRBC: 0 % (ref 0.0–0.2)

## 2021-03-22 LAB — BASIC METABOLIC PANEL
Anion gap: 11 (ref 5–15)
BUN: 15 mg/dL (ref 8–23)
CO2: 22 mmol/L (ref 22–32)
Calcium: 8.9 mg/dL (ref 8.9–10.3)
Chloride: 99 mmol/L (ref 98–111)
Creatinine, Ser: 0.59 mg/dL (ref 0.44–1.00)
GFR, Estimated: >60 mL/min (ref >60)
Glucose, Bld: 137 mg/dL — ABNORMAL HIGH (ref 70–99)
Potassium: 3.4 mmol/L — ABNORMAL LOW (ref 3.5–5.1)
Sodium: 132 mmol/L — ABNORMAL LOW (ref 135–145)

## 2021-03-22 LAB — ECHOCARDIOGRAM COMPLETE
Height: 67 in
S' Lateral: 3.2 cm
Weight: 4560 oz

## 2021-03-22 LAB — GLUCOSE, CAPILLARY
Glucose-Capillary: 153 mg/dL — ABNORMAL HIGH (ref 70–99)
Glucose-Capillary: 153 mg/dL — ABNORMAL HIGH (ref 70–99)
Glucose-Capillary: 148 mg/dL — ABNORMAL HIGH (ref 70–99)

## 2021-03-22 LAB — VANCOMYCIN, PEAK: Vancomycin Pk: 28 ug/mL — ABNORMAL LOW (ref 30–40)

## 2021-03-22 LAB — VANCOMYCIN, TROUGH: Vancomycin Tr: 10 ug/mL — ABNORMAL LOW (ref 15–20)

## 2021-03-22 MED ORDER — POTASSIUM CHLORIDE CRYS ER 20 MEQ PO TBCR
40.0000 meq | EXTENDED_RELEASE_TABLET | Freq: Two times a day (BID) | ORAL | Status: AC
  Administered 2021-03-22 (×2): 40 meq via ORAL
  Filled 2021-03-22 (×2): qty 2

## 2021-03-22 MED ORDER — PANTOPRAZOLE SODIUM 40 MG IV SOLR
40.0000 mg | Freq: Two times a day (BID) | INTRAVENOUS | Status: DC
  Administered 2021-03-22 (×2): 40 mg via INTRAVENOUS
  Filled 2021-03-22 (×2): qty 10

## 2021-03-22 MED ORDER — ONDANSETRON HCL 4 MG/2ML IJ SOLN
4.0000 mg | Freq: Four times a day (QID) | INTRAMUSCULAR | Status: DC | PRN
Start: 2021-03-22 — End: 2021-03-30
  Administered 2021-03-22: 4 mg via INTRAVENOUS
  Filled 2021-03-22: qty 2

## 2021-03-22 NOTE — Progress Notes (Signed)
Occupational Therapy Treatment ?Patient Details ?Name: Selena Rodgers ?MRN: [id number] ?DOB: [birth year] ?Today's Date: 03/22/2021 ? ? ?History of present illness Pt is a 71 y.o. female presenting to hospital 3/16 for evaluation of weakness (slipped forward out of recliner and unable to get back up).  Pt admitted with generalized weakness, CAP, anemia, SOB, hyponatremia, and endometrial CA (currently receiving treatment).  Concern for cellulitis in L abdominal pannus.  Pt with large pendulous hernia of LLQ of abdomen.  PMH includes endometrial CA (daily radiation), DM, htn, anxiety, chronic back pain, spinal stenosis, uterine CA, ventral and umbilical hernia repair, ACDF, CTR, and foot surgery. ?  ?OT comments ? Upon entering the room, pt supine in bed and agreeable to OT intervention. Pt requesting to attempt sitting on EOB with therapist. She moves B LEs partially towards EOB with heavy max A for trunk support. Pt initially needing max A for static sitting balance but eventually progressing with min A and sitting for 15 minutes while therapist assisted her with untangling hair. Pt fatigues after sitting and returns to bed with max A. No control when lowering UB towards bed. RN arrives to assist with proper positioning in bed. Pt remains with RN for purewick needs. Pt continues to benefit from OT intervention.   ? ?Recommendations for follow up therapy are one component of a multi-disciplinary discharge planning process, led by the attending physician.  Recommendations may be updated based on patient status, additional functional criteria and insurance authorization. ?   ?Follow Up Recommendations ? Skilled nursing-short term rehab (<3 hours/day)  ?  ?Assistance Recommended at Discharge Frequent or constant Supervision/Assistance  ?Patient can return home with the following ? Two people to help with walking and/or transfers;A lot of help with bathing/dressing/bathroom;Help with stairs or ramp for entrance;Assist for  transportation ?  ?Equipment Recommendations ? Other (comment) (defer to next venue of care)  ?  ?   ?Precautions / Restrictions Precautions ?Precautions: Fall ?Precaution Comments: Large pendulous hernia of LLQ of abdomen ?Restrictions ?Weight Bearing Restrictions: No ?Other Position/Activity Restrictions: Pt wears specialized shoes whenever out of bed  ? ? ?  ? ?Mobility Bed Mobility ?Overal bed mobility: Needs Assistance ?Bed Mobility: Supine to Sit, Sit to Supine ?  ?  ?Supine to sit: Max assist ?Sit to supine: Max assist ?  ?General bed mobility comments: heavy max assist ?  ? ? ?  ?  ?  ?  ?Balance Overall balance assessment: Needs assistance ?Sitting-balance support: Bilateral upper extremity supported, Feet supported ?Sitting balance-Leahy Scale: Poor ?Sitting balance - Comments: min - heavy max A on EOB ?  ?  ?  ?  ?  ?  ?  ?  ?  ?  ?  ?  ?  ?  ?  ?   ? ?ADL either performed or assessed with clinical judgement  ? ?ADL Overall ADL's : Needs assistance/impaired ?  ?  ?  ?  ?  ?  ?  ?  ?  ?  ?  ?  ?  ?  ?  ?  ?  ?  ?  ?  ?  ? ?Extremity/Trunk Assessment Upper Extremity Assessment ?Upper Extremity Assessment: Generalized weakness ?  ?Lower Extremity Assessment ?Lower Extremity Assessment: Defer to PT evaluation ?  ?  ?  ? ?Vision Patient Visual Report: No change from baseline ?  ?  ?   ?   ? ?Cognition Arousal/Alertness: Awake/alert ?Behavior During Therapy: [facility] for tasks assessed/performed ?Overall Cognitive Status: Within  Functional Limits for tasks assessed ?  ?  ?  ?  ?  ?  ?  ?  ?  ?  ?  ?  ?  ?  ?  ?  ?  ?  ?  ?   ?   ?   ?   ? ? ?Pertinent Vitals/ Pain       Pain Assessment ?Pain Assessment: Faces ?Faces Pain Scale: Hurts little more ?Pain Location: R low back/buttock pain ?Pain Descriptors / Indicators: Cramping ?Pain Intervention(s): Limited activity within patient's tolerance, Monitored during session, Repositioned ? ?   ?   ? ?Frequency ? Min 3X/week  ? ? ? ? ?  ?Progress Toward Goals ? ?OT  Goals(current goals can now be found in the care plan section) ? Progress towards OT goals: Progressing toward goals ? ?Acute Rehab OT Goals ?Patient Stated Goal: to do for myself ?OT Goal Formulation: With patient ?Time For Goal Achievement: 04/01/21 ?Potential to Achieve Goals: Good  ?Plan Discharge plan remains appropriate;Frequency remains appropriate   ? ?   ?AM-PAC OT "6 Clicks" Daily Activity     ?Outcome Measure ? ? Help from another person eating meals?: None ?Help from another person taking care of personal grooming?: A Lot ?Help from another person toileting, which includes using toliet, bedpan, or urinal?: Total ?Help from another person bathing (including washing, rinsing, drying)?: Total ?Help from another person to put on and taking off regular upper body clothing?: A Lot ?Help from another person to put on and taking off regular lower body clothing?: Total ?6 Click Score: 11 ? ?  ?End of Session Equipment Utilized During Treatment: Rolling walker (2 wheels) ? ?OT Visit Diagnosis: Unsteadiness on feet (R26.81);History of falling (Z91.81);Repeated falls (R29.6);Muscle weakness (generalized) (M62.81) ?  ?Activity Tolerance Patient limited by pain ?  ?Patient Left in bed;with bed alarm set;with call bell/phone within reach ?  ?Nurse Communication Mobility status;Patient requests pain meds ?  ? ?   ? ?Time: 8675-4492 ?OT Time Calculation (min): 25 min ? ?Charges: OT General Charges ?$OT Visit: 1 Visit ?OT Treatments ?$Therapeutic Activity: 23-37 mins ? ?Darleen Crocker, [place], OTR/L , [place] ?ascom [phone]  ?03/22/21, 4:06 PM  ?

## 2021-03-22 NOTE — Progress Notes (Signed)
Pharmacy Antibiotic Note ? ?Selena Rodgers is a 71 y.o. female w/ PMH of arthritis and endometrial cancer (currently on Radiation therapy), T2DM, & HTNadmitted on 03/16/2021 with extreme fatigue >> c/f CAP + cellulitis of abdominal wall and newly discovered MRSE bacteremia.  Pharmacy has been consulted for vancomycin dosing. ? ?Today, 03/22/2021 ?Day #4 vancomycin ?Renal: SCr stable ?WBC WNL ?Repeat blood cx from 3/19 remain NGTD ?Vancomycin levels on vancomycin 1gm IV q12h (dose 3/20 at 2203) ?Vanco peak 3/21 0030 = 28 ?Vanco trough 3/21 0853 = 10 ?Calculated AUC  =446.4 (goal 400-600) ?Cmax = 33.5, Cmin 8.7 ? ?Plan:  ?Based on vancomycin levels above, continue vancomycin 1000 mg IV every 12 hours as AUC is within goal of 400-600 ?Continue to monitor Renal function ?ECHO completed 3/20 but not yet read,  plans for possible TEE ?F/u duration vancomycin once tests complete ? ?Height: '5\' 7"'$  (170.2 cm) ?Weight: 129.3 kg (285 lb) ?IBW/kg (Calculated) : 61.6 ? ?Temp (24hrs), Avg:98.2 ?F (36.8 ?C), Min:98 ?F (36.7 ?C), Max:98.6 ?F (37 ?C) ? ?Recent Labs  ?Lab 03/16/21 ?2329 03/17/21 ?3428 03/18/21 ?7681 03/19/21 ?1572 03/20/21 ?6203 03/21/21 ?0423 03/22/21 ?0030 03/22/21 ?0300 03/22/21 ?0853  ?WBC 8.4   < > 5.9 3.4* 5.2 6.6  --  8.4  --   ?CREATININE 0.77   < > 0.75 0.64 0.51 0.52  --  0.59  --   ?LATICACIDVEN 1.6  --   --   --   --   --   --   --   --   ?[place]  --   --   --   --   --   --   --   --  10*  ?VANCOPEAK  --   --   --   --   --   --  28*  --   --   ? < > = values in this interval not displayed.  ? ?  ?Estimated Creatinine Clearance: 94.2 mL/min (by C-G formula based on SCr of 0.59 mg/dL).   ? ?Allergies  ?Allergen Reactions  ? Chlorpheniramine Anaphylaxis  ? Flaxseed (Linseed) Anaphylaxis  ? Codeine Hives  ? Flax Seed Oil [Bio-Flax] Swelling  ?  UNSPECIFIED   ? Latex Hives and Other (See Comments)  ?  BLISTERS  ? Tape Other (See Comments)  ?  BLISTERS  ? Tapentadol Other (See Comments)  ?  BLISTERS  ?  Rosemary Oil Swelling  ?  Lip swelling  ? 2,4-D Dimethylamine Rash  ?  T-DAP  ? Aspirin Rash  ? ? ?Antimicrobials this admission: ?03/16 ceftriaxone >> 3/20 ?03/16 azithromycin >> 3/20 ?03/18 vancomycin >>  ? ?Microbiology results: ?03/15 BCx '@2330'$ : 2/2 MRSE ?03/16 Bcx @ 0630: 1/2 S epidermidis ?3/16 Bcx '@2220'$ : 3/4 S epidermidis ?3/19 Bcx NGTD ?03/17 MRSA PCR: positive ? ?Thank you for allowing pharmacy to be a part of this patient?s care. ? ?Doreene Eland, PharmD, BCPS, BCIDP ?Work Cell: [phone] ?03/22/2021 10:08 AM ? ? ? ?

## 2021-03-22 NOTE — [place] FL2 (Signed)
?[place] MEDICAID FL2 LEVEL OF CARE SCREENING TOOL  ?  ? ?IDENTIFICATION  ?Patient Name: ?Selena Rodgers Birthdate: 05-Aug-1950 Sex: female Admission Date (Current Location): ?03/16/2021  ?[place] and [place] Number: ? [place] ?  Facility and Address:  ?[facility], [address] [place], [place] [address] ?     Provider Number: ?[id number]  ?Attending Physician Name and Address:  ?Bonnielee Haff, MD ? Relative Name and Phone Number:  ?  ?   ?Current Level of Care: ?Hospital Recommended Level of Care: ?[place] Prior Approval Number: ? [id number] A ? ?Date Approved/Denied: ?  PASRR Number: ?  ? ?Discharge Plan: ?SNF ?  ? ?Current Diagnoses: ?Patient Active Problem List  ? Diagnosis Date Noted  ? Bacteremia 03/18/2021  ? Hyponatremia 03/17/2021  ? Anemia 03/17/2021  ? Diabetes ([place]) 03/17/2021  ? GERD (gastroesophageal reflux disease) 03/17/2021  ? CAP (community acquired pneumonia) 03/17/2021  ? Cellulitis of abdominal wall 03/17/2021  ? Cellulitis 03/17/2021  ? Endometrial cancer ([place]) 02/09/2021  ? Post-menopausal bleeding 02/09/2021  ? Acquired scoliosis 11/11/2020  ? Arthrodesis status 10/19/2020  ? History of lumbar fusion 04/13/2020  ? Other forms of scoliosis, site unspecified 08/29/2019  ? Body mass index (BMI) 40.0-44.9, adult ([place]) 08/29/2019  ? Lip swelling 07/28/2019  ? Spondylolisthesis, lumbar region 06/25/2019  ? Neck pain 02/25/2016  ? History of excision of lamina of cervical vertebra for decompression of spinal cord 06/22/2015  ? Adjustment disorder with mixed anxiety and depressed mood   ? Slow transit constipation   ? Myelopathy ([place]) 04/21/2015  ? Cervical myelopathy ([place])   ? Constipation due to pain medication   ? Depression   ? Primary osteoarthritis of right shoulder   ? S/P spinal surgery   ? Abnormality of gait   ? Generalized weakness   ? Other secondary hypertension   ? Post-operative pain   ? Arthritis   ? Cervical spondylosis with  myelopathy 04/19/2015  ? 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 Ventral hernia 10/07/2014  ? Current tobacco use 10/06/2014  ? Anxiety 09/17/2014  ? Back pain 09/17/2014  ? Clinical depression 09/17/2014  ? BP (high blood pressure) 09/17/2014  ? Borderline diabetes 09/17/2014  ? Arthritis, degenerative 09/17/2014  ? Spinal stenosis 09/17/2014  ? Compulsive tobacco user syndrome 09/17/2014  ? Abdominal mass 06/30/2014  ? Incisional hernia, without obstruction or gangrene 06/30/2014  ? Morbid obesity ([place]) 06/30/2014  ? Hypercholesteremia 05/23/2013  ? 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 Hypercholesterolemia 05/23/2013  ? 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 ? ?Orientation RESPIRATION BLADDER Height & Weight   ?  ?Self, Time, Situation, Place ? Normal Incontinent Weight: 285 lb (129.3 kg) ?Height:  '5\' 7"'$  (170.2 cm)  ?BEHAVIORAL SYMPTOMS/MOOD NEUROLOGICAL BOWEL NUTRITION STATUS  ?    Incontinent Diet (regular diet, thin liquids, liquid restriction 1500 mL)  ?AMBULATORY STATUS COMMUNICATION OF NEEDS Skin   ?Extensive Assist Verbally  (open wound on lower right breast. open wound lower abdomen. open wound on groin.) ?  ?  ?  ?    ?     ?     ? ? ?Personal Care Assistance Level of Assistance  ?Dressing, Feeding, Bathing Bathing Assistance: Maximum assistance ?Feeding assistance: Independent ?Dressing Assistance: Maximum assistance ?   ? ?Functional Limitations Info  ?Sight, Hearing, Speech Sight Info: Adequate ?Hearing Info: Adequate ?Speech Info: Adequate  ? ? ?SPECIAL CARE FACTORS FREQUENCY  ?PT (By licensed PT), OT (By licensed OT)   ?  ?  PT Frequency: 5x ?OT Frequency: 5x ?  ?  ?  ?   ? ? ?Contractures Contractures Info: Not present  ? ? ?Additional Factors Info  ?Code Status, Allergies Code Status Info: full code ?Allergies Info: Chlorpheniramine, Flaxseed (Linseed), Codeine, Flax Seed Oil (Bio-flax), Latex, Tape, Tapentadol, Rosemary Oil, 2,4-d Dimethylamine, Aspirin ?  ?  ?  ?   ? ?Current  Medications (03/22/2021):  This is the current hospital active medication list ?Current Facility-Administered Medications  ?Medication Dose Route Frequency Provider Last Rate Last Admin  ? acetaminophen (TYLENOL) tablet 650 mg  650 mg Oral Q6H PRN Para Skeans, MD   650 mg at 03/17/21 6962  ? Or  ? acetaminophen (TYLENOL) suppository 650 mg  650 mg Rectal Q6H PRN Para Skeans, MD      ? acetaminophen (TYLENOL) tablet 1,000 mg  1,000 mg Oral TID Para Skeans, MD   1,000 mg at 03/21/21 1606  ? ALPRAZolam Duanne Moron) tablet 0.25-0.5 mg  0.25-0.5 mg Oral BID PRN Para Skeans, MD   0.5 mg at 03/22/21 9528  ? alum & mag hydroxide-simeth (MAALOX/MYLANTA) 200-200-20 MG/5ML suspension 30 mL  30 mL Oral Q4H PRN Bonnielee Haff, MD   30 mL at 03/22/21 0600  ? Chlorhexidine Gluconate Cloth 2 % PADS 6 each  6 each Topical Q0600 Bonnielee Haff, MD   6 each at 03/22/21 0600  ? citalopram (CELEXA) tablet 20 mg  20 mg Oral Daily Florina Ou V, MD   20 mg at 03/22/21 0934  ? heparin injection 5,000 Units  5,000 Units Subcutaneous Q8H Para Skeans, MD   5,000 Units at 03/22/21 0600  ? hydrALAZINE (APRESOLINE) injection 5 mg  5 mg Intravenous Q8H PRN Para Skeans, MD      ? insulin aspart (novoLOG) injection 0-9 Units  0-9 Units Subcutaneous TID WC Para Skeans, MD   2 Units at 03/22/21 4132  ? insulin aspart (novoLOG) injection 2 Units  2 Units Subcutaneous TID WC Para Skeans, MD   2 Units at 03/21/21 1249  ? loperamide (IMODIUM) capsule 4 mg  4 mg Oral TID PRN Bonnielee Haff, MD   4 mg at 03/19/21 4401  ? LORazepam (ATIVAN) injection 0.5 mg  0.5 mg Intravenous Once PRN Bonnielee Haff, MD      ? megestrol (MEGACE) tablet 40 mg  40 mg Oral Daily Para Skeans, MD   40 mg at 03/22/21 0934  ? menthol-cetylpyridinium (CEPACOL) lozenge 3 mg  1 lozenge Oral PRN Lorna Dibble, RPH      ? methocarbamol (ROBAXIN) 500 mg in dextrose 5 % 50 mL IVPB  500 mg Intravenous Q8H PRN Bonnielee Haff, MD   Stopped at 03/20/21 1057  ? mupirocin  ointment (BACTROBAN) 2 % 1 application.  1 application. Nasal BID Bonnielee Haff, MD   1 application. at 03/22/21 0955  ? ondansetron (ZOFRAN) injection 4 mg  4 mg Intravenous Q6H PRN Bonnielee Haff, MD   4 mg at 03/22/21 0272  ? oxyCODONE (Oxy IR/ROXICODONE) immediate release tablet 5 mg  5 mg Oral Q4H PRN Bonnielee Haff, MD   5 mg at 03/22/21 5366  ? pantoprazole (PROTONIX) injection 40 mg  40 mg Intravenous Q12H Bonnielee Haff, MD   40 mg at 03/22/21 0940  ? polyethylene glycol (MIRALAX / GLYCOLAX) packet 17 g  17 g Oral Daily PRN Bonnielee Haff, MD      ? potassium chloride SA (KLOR-CON M) CR tablet 40 mEq  40 mEq Oral BID Bonnielee Haff, MD   40 mEq at 03/22/21 4132  ? senna-docusate (Senokot-S) tablet 2 tablet  2 tablet Oral QHS Bonnielee Haff, MD   2 tablet at 03/19/21 2143  ? sodium chloride tablet 1 g  1 g Oral BID WC Bonnielee Haff, MD   1 g at 03/22/21 4401  ? vancomycin (VANCOCIN) IVPB 1000 mg/200 mL premix  1,000 mg Intravenous Q12H Dallie Piles, RPH 200 mL/hr at 03/21/21 2203 1,000 mg at 03/21/21 2203  ? ? ? ?Discharge Medications: ?Please see discharge summary for a list of discharge medications. ? ?Relevant Imaging Results: ? ?Relevant Lab Results: ? ? ?Additional Information ?[id number] ? ?Naylah Cork A Kayston Jodoin, LCSW ? ? ? ? ?

## 2021-03-22 NOTE — Progress Notes (Signed)
? ?[facility] ?PROGRESS NOTE ? ? ?Selena Rodgers WPY:[id number] DOB: 20-Feb-1950 DOA: 03/16/2021  5 ?DOS: the patient was seen and examined on 03/22/2021 ? ?PCP: [name], MD ? ?Brief History and Hospital Course:  ?71 y.o. female with medical history significant of arthritis and endometrial cancer, dm II, HTN.  Patient is receiving radiation treatment at the cancer center.  She felt extreme fatigued at home.  Could not go for radiation treatment on the day of admission.  She also mentioned cough shortness of breath.  She was found to have hyponatremia.  CT scan raised concern for pneumonia.  She was hospitalized for further management.  There was also some concern for cellulitis in her left abdominal pannus.  Started on antibiotics.  Found to have Staph epidermidis bacteremia.  Initially thought to be contaminant but repeat blood cultures also showed similar organism.  ID was consulted.  On vancomycin. ? ?Consultants:  ?GYN oncology notified.   ?Infectious disease ? ?Procedures: Transthoracic echocardiogram, report is pending ? ? ?Subjective: ?Patient complains of vomiting this morning.  No abdominal pain.  Complains of acid reflux.  Other issues are stable.   ? ? ?Assessment/Plan: ? ? ?* CAP (community acquired pneumonia) ?Patient presented with generalized weakness cough shortness of breath.  Chest x-ray did not show any infiltrate but CT scan did raise concern for pneumonia.  ?Required oxygen briefly. ?Remains afebrile.  WBC is normal. ?Ceftriaxone and azithromycin for 5 days.  Respiratory status has improved.  Occasional cough.  Saturating normal on room air. ? ?Bacteremia ?Staphylococcus epidermidis bacteremia ? ?BC ID did turn positive for Staph epidermidis.  Repeat set also reveals Staph epidermidis.  Patient started on vancomycin.   ?Sensitivities reviewed.   ?ID was consulted.  Continue vancomycin.   ?Still waiting on echocardiogram report.   ?TEE was recommended by ID but patient's  respiratory status and her morbid obesity and other comorbidities make it somewhat of a risky proposition.  Patient is hesitant too.  Discussed with ID.  They will recommend a longer course of antibiotics.  Plan for PICC line tomorrow if cultures from 3/19 remain negative. ? ?Cellulitis of abdominal wall ?Patient has maceration of the skin over the left abdomen/pannus.  Erythema was noted suggesting cellulitis.  No clear abscess. ?Seen by wound care.  Wound care dressing changes to be performed. ?Continue vancomycin.  Erythema appears to have improved.  Continue local wound care. ? ?Endometrial cancer ([place]) ?Has been receiving radiation treatment.  Supposed to end March 29.  ?Oncology was notified of admission.  Radiation treatments on hold.  ?Patient mentioned that she passed 2 small clots vaginally 2 days ago.  Hemoglobin stable. ?Noted to be on megestrol. ? ?Generalized weakness ?Hypokalemia ? ?Likely due to dehydration, pneumonia, anemia.  Vital signs are stable.  Lactic acid level was normal.  CK was noted to be elevated at 819, improved to 709.  ?Needs a skilled nursing facility for rehabilitation per physical therapy. ?Continue to replete potassium. ? ?Hyponatremia ?Previously normal.  Likely due to dehydration.  ?Sodium levels low but stable.   ?Urine osmolality is 970.  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  Urine sodium was 43.   ?Continue with fluid restriction and salt tablets.  Sodium level slightly better today.  Stable for the most part.  Continue to monitor. ? ?Diabetes ([place]) ?Monitor CBGs.  Continue SSI. ? ? ?Anemia ?Anemia likely multifactorial.  Previously has had acute on chronic blood loss anemia from vaginal bleeding.  She has required  blood transfusion. ?Now there is some element of anemia of chronic disease.  Hemoglobin low but stable.  Continue to monitor.  ? ?Back pain ?Patient with chronic history of back pain but has noticed worsening leg pain as well as leg weakness in the last few days.   Denies any falls in her back.  She is able to move her legs but has diminished strength.   ?She has had spinal fusion surgery previously.  [name] in [place] is her neurosurgeon. ?MRI of the lumbar spine was done on 3/18 which does show significant disease but no different from previous MRI which was done in 2021.  Patient and sister reassured.  Continue to monitor.  Physical therapy.  Pain control.   ? ? ?GERD (gastroesophageal reflux disease) ?Vomiting ? ?Patient complains of vomiting this morning.  Denies any abdominal pain.  Does have acid reflux.  Given antiemetics.  Increase dose of PPI.  Monitor for now.  Abdomen remains benign.  Recently done CT scan did not show any obstruction. ? ?Current tobacco use ?Nicotine patch. ? ? ? ?Morbid obesity ?Estimated body mass index is 44.64 kg/m? as calculated from the following: ?  Height as of this encounter: '5\' 7"'$  (1.702 m). ?  Weight as of this encounter: 129.3 kg. ? ? ?  ?DVT Prophylaxis: Subcutaneous heparin ?Code Status: Full code ?Family Communication: Discussed with patient ?Disposition Plan: Inpatient rehabilitation recommended by PT but she is not a good candidate for same per notes from rehab.  Will likely need to go to skilled nursing facility when medically improved. ? ?Status is: Inpatient ?Remains inpatient appropriate because: Need for IV antibiotics. ? ? ? ? ?Medications: Scheduled: ? acetaminophen  1,000 mg Oral TID  ? Chlorhexidine Gluconate Cloth  6 each Topical Q0600  ? citalopram  20 mg Oral Daily  ? heparin  5,000 Units Subcutaneous Q8H  ? insulin aspart  0-9 Units Subcutaneous TID WC  ? insulin aspart  2 Units Subcutaneous TID WC  ? megestrol  40 mg Oral Daily  ? mupirocin ointment  1 application. Nasal BID  ? pantoprazole (PROTONIX) IV  40 mg Intravenous Q12H  ? potassium chloride  40 mEq Oral BID  ? senna-docusate  2 tablet Oral QHS  ? sodium chloride  1 g Oral BID WC  ? ?Continuous: ? methocarbamol (ROBAXIN) IV Stopped (03/20/21 1057)  ?  vancomycin 1,000 mg (03/21/21 2203)  ? ?HCW:[id number] **OR** acetaminophen, ALPRAZolam, alum & mag hydroxide-simeth, hydrALAZINE, loperamide, LORazepam, menthol-cetylpyridinium, methocarbamol (ROBAXIN) IV, ondansetron (ZOFRAN) IV, oxyCODONE, polyethylene glycol ? ?Antibiotics: ?Anti-infectives (From admission, onward)  ? ? Start     Dose/Rate Route Frequency Ordered Stop  ? 03/19/21 2200  vancomycin (VANCOCIN) IVPB 1000 mg/200 mL premix       ? 1,000 mg ?200 mL/hr over 60 Minutes Intravenous Every 12 hours 03/19/21 0950    ? 03/19/21 1100  vancomycin (VANCOREADY) IVPB 2000 mg/400 mL       ? 2,000 mg ?200 mL/hr over 120 Minutes Intravenous  Once 03/19/21 0950 03/19/21 1355  ? 03/18/21 0000  cefTRIAXone (ROCEPHIN) 2 g in sodium chloride 0.9 % 100 mL IVPB  Status:  Discontinued       ? 2 g ?200 mL/hr over 30 Minutes Intravenous Every 24 hours 03/17/21 0931 03/21/21 1020  ? 03/17/21 1030  azithromycin (ZITHROMAX) tablet 500 mg       ? 500 mg Oral Daily 03/17/21 0931 03/21/21 0854  ? 03/17/21 0045  cefTRIAXone (ROCEPHIN) 1 g in sodium  chloride 0.9 % 100 mL IVPB       ? 1 g ?200 mL/hr over 30 Minutes Intravenous  Once 03/17/21 0030 03/17/21 0115  ? ?  ? ? ?Objective: ? ?Vital Signs ? ?Vitals:  ? 03/21/21 1631 03/21/21 1942 03/22/21 0542 03/22/21 0755  ?BP: (!) 106/51 129/65 (!) 145/58 121/80  ?Pulse: 75 75 82 87  ?Resp: '16 16 18 20  '$ ?Temp: 98 ?F (36.7 ?C) 98.1 ?F (36.7 ?C) 98 ?F (36.7 ?C) 98.6 ?F (37 ?C)  ?TempSrc: Oral Oral    ?SpO2: 97% 96% 95% 96%  ?Weight:      ?Height:      ? ? ?Intake/Output Summary (Last 24 hours) at 03/22/2021 1004 ?Last data filed at 03/22/2021 0600 ?Gross per 24 hour  ?Intake 894.64 ml  ?Output 275 ml  ?Net 619.64 ml  ? ?Filed Weights  ? 03/16/21 2315  ?Weight: 129.3 kg  ? ? ?General appearance: Awake alert.  In no distress ?Resp: Improved air entry bilaterally.  Coarse breath sounds.  Few crackles at the bases.  No wheezing or rhonchi. ?Cardio: S1-S2 is normal regular.  No S3-S4.  No rubs  murmurs or bruit ?GI: Abdomen is obese.  Left-sided hernia/pannus noted.  Erythema noted over this area covered in dressing.  Abdomen is nontender. ?Extremities: No edema.  Restricted range of motion of both lower

## 2021-03-23 ENCOUNTER — Ambulatory Visit: Payer: Medicare HMO

## 2021-03-23 DIAGNOSIS — J189 Pneumonia, unspecified organism: Secondary | ICD-10-CM | POA: Diagnosis not present

## 2021-03-23 DIAGNOSIS — R7881 Bacteremia: Secondary | ICD-10-CM | POA: Diagnosis not present

## 2021-03-23 LAB — CBC WITH DIFFERENTIAL/PLATELET
Abs Immature Granulocytes: 0.27 10*3/uL — ABNORMAL HIGH (ref 0.00–0.07)
Basophils Absolute: 0 10*3/uL (ref 0.0–0.1)
Basophils Relative: 0 %
Eosinophils Absolute: 0.1 10*3/uL (ref 0.0–0.5)
Eosinophils Relative: 1 %
HCT: 31.2 % — ABNORMAL LOW (ref 36.0–46.0)
Hemoglobin: 9.9 g/dL — ABNORMAL LOW (ref 12.0–15.0)
Immature Granulocytes: 2 %
Lymphocytes Relative: 4 %
Lymphs Abs: 0.5 10*3/uL — ABNORMAL LOW (ref 0.7–4.0)
MCH: 26.7 pg (ref 26.0–34.0)
MCHC: 31.7 g/dL (ref 30.0–36.0)
MCV: 84.1 fL (ref 80.0–100.0)
Monocytes Absolute: 1 10*3/uL (ref 0.1–1.0)
Monocytes Relative: 9 %
Neutro Abs: 9.6 10*3/uL — ABNORMAL HIGH (ref 1.7–7.7)
Neutrophils Relative %: 84 %
Platelets: 297 10*3/uL (ref 150–400)
RBC: 3.71 MIL/uL — ABNORMAL LOW (ref 3.87–5.11)
RDW: 17.9 % — ABNORMAL HIGH (ref 11.5–15.5)
WBC: 11.5 10*3/uL — ABNORMAL HIGH (ref 4.0–10.5)
nRBC: 0 % (ref 0.0–0.2)

## 2021-03-23 LAB — BASIC METABOLIC PANEL
Anion gap: 11 (ref 5–15)
BUN: 19 mg/dL (ref 8–23)
CO2: 23 mmol/L (ref 22–32)
Calcium: 8.8 mg/dL — ABNORMAL LOW (ref 8.9–10.3)
Chloride: 97 mmol/L — ABNORMAL LOW (ref 98–111)
Creatinine, Ser: 0.76 mg/dL (ref 0.44–1.00)
GFR, Estimated: >60 mL/min (ref >60)
Glucose, Bld: 153 mg/dL — ABNORMAL HIGH (ref 70–99)
Potassium: 3.8 mmol/L (ref 3.5–5.1)
Sodium: 131 mmol/L — ABNORMAL LOW (ref 135–145)

## 2021-03-23 LAB — GLUCOSE, CAPILLARY
Glucose-Capillary: 161 mg/dL — ABNORMAL HIGH (ref 70–99)
Glucose-Capillary: 160 mg/dL — ABNORMAL HIGH (ref 70–99)
Glucose-Capillary: 184 mg/dL — ABNORMAL HIGH (ref 70–99)
Glucose-Capillary: 140 mg/dL — ABNORMAL HIGH (ref 70–99)

## 2021-03-23 MED ORDER — PANTOPRAZOLE SODIUM 40 MG PO TBEC
40.0000 mg | DELAYED_RELEASE_TABLET | Freq: Every day | ORAL | Status: DC
  Administered 2021-03-23 – 2021-03-29 (×7): 40 mg via ORAL
  Filled 2021-03-23 (×7): qty 1

## 2021-03-23 MED ORDER — FAMOTIDINE 20 MG PO TABS
20.0000 mg | ORAL_TABLET | Freq: Two times a day (BID) | ORAL | Status: DC
  Administered 2021-03-23 – 2021-03-29 (×13): 20 mg via ORAL
  Filled 2021-03-23 (×13): qty 1

## 2021-03-23 MED ORDER — SIMETHICONE 80 MG PO CHEW
80.0000 mg | CHEWABLE_TABLET | Freq: Four times a day (QID) | ORAL | Status: DC
  Administered 2021-03-23 – 2021-03-29 (×26): 80 mg via ORAL
  Filled 2021-03-23 (×29): qty 1

## 2021-03-23 MED ORDER — MIRTAZAPINE 15 MG PO TABS
15.0000 mg | ORAL_TABLET | Freq: Every day | ORAL | Status: DC
  Administered 2021-03-23 – 2021-03-28 (×6): 15 mg via ORAL
  Filled 2021-03-23 (×6): qty 1

## 2021-03-23 MED ORDER — METHOCARBAMOL 500 MG PO TABS
500.0000 mg | ORAL_TABLET | Freq: Three times a day (TID) | ORAL | Status: DC | PRN
  Filled 2021-03-23: qty 1

## 2021-03-23 NOTE — Progress Notes (Signed)
? ? ?  [facility] has been requested to perform a transesophageal echocardiogram on Selena Rodgers for bacteremia.  I spoke with Ms. Selena Rodgers, as well as her sister Selena Rodgers @ [phone].  After careful review of history and examination, the risks and benefits of transesophageal echocardiogram have been explained including risks of esophageal damage, perforation (1:10,000 risk), bleeding, pharyngeal hematoma as well as other potential complications associated with conscious sedation including aspiration, arrhythmia, respiratory failure and death. Alternatives to treatment were discussed, questions were answered. Patient and sister/POA have agreed to proceed.  ? ?Murray Hodgkins, NP  ?03/23/2021 1:03 PM   ?

## 2021-03-23 NOTE — Progress Notes (Signed)
PT Cancellation Note ? ?Patient Details ?Name: Selena Rodgers ?MRN: [id number] ?DOB: 31-Jan-1950 ? ? ?Cancelled Treatment:    Reason Eval/Treat Not Completed: Pain limiting ability to participate. Patient reports she is in a lot of pain, just had bath. Would like to wait until later. Will re-attempt after lunch.  ? ? ?[name] ?03/23/2021, 10:57 AM ?

## 2021-03-23 NOTE — TOC Progression Note (Signed)
Transition of Care (TOC) - Progression Note  ? ? ?Patient Details  ?Name: Selena Rodgers ?MRN: [id number] ?Date of Birth: November 14, 1950 ? ?Transition of Care (TOC) CM/SW Contact  ?Mussa Groesbeck A Damek Ende, LCSW ?Phone Number: ?03/23/2021, 10:41 AM ? ?Clinical Narrative:   CSW provided bed offers to pt at bedside. Pt ask that CSW contact her sister as she will be making that decision for the pt. CSW confirmed with pt again that she wanted sister to make her decision based on SNF- pt confirmed.  ?CSW spoke with pt's sister and provided bed offers.Pt's sister will call CSW back shortly with choice and at that time Josem Kaufmann will be started.  ? ? ? ?  ?  ? ?Expected Discharge Plan and Services ?  ?  ?  ?  ?  ?                ?  ?  ?  ?  ?  ?  ?  ?  ?  ?  ? ? ?Social Determinants of Health (SDOH) Interventions ?  ? ?Readmission Risk Interventions ?   ? View : No data to display.  ?  ?  ?  ? ? ?

## 2021-03-23 NOTE — Progress Notes (Signed)
OT Cancellation Note ? ?Patient Details ?Name: Selena Rodgers ?MRN: [id number] ?DOB: 1950-06-05 ? ? ?Cancelled Treatment:    Reason Eval/Treat Not Completed: Pain limiting ability to participate;Other (comment) (attempted co-tx with PT, pt declined this morning, reporting significant pain. Requested therapist to return later. Will re-attempt as able.) ? ?Shanon Payor, OTD OTR/L  ?03/23/21, 10:37 AM  ?

## 2021-03-23 NOTE — Progress Notes (Signed)
Physical Therapy Treatment ?Patient Details ?Name: Selena Rodgers ?MRN: [id number] ?DOB: 1950-05-19 ?Today's Date: 03/23/2021 ? ? ?History of Present Illness Pt is a 71 y.o. female presenting to hospital 3/16 for evaluation of weakness (slipped forward out of recliner and unable to get back up).  Pt admitted with generalized weakness, CAP, anemia, SOB, hyponatremia, and endometrial CA (currently receiving treatment).  Concern for cellulitis in L abdominal pannus.  Pt with large pendulous hernia of LLQ of abdomen.  PMH includes endometrial CA (daily radiation), DM, htn, anxiety, chronic back pain, spinal stenosis, uterine CA, ventral and umbilical hernia repair, ACDF, CTR, and foot surgery. ? ?  ?PT Comments  ? ? Patient received in bed, she is feeling better, agrees to PT/OT session. Patient requires heavy +2 for supine><sit. Once assisted to sitting on edge of bed she initially needs Max assist, then progresses to supervision. Patient able to partially stand x2-3 reps with +2 mod assist. She will continue to benefit from skilled PT while here to improve strength and functional independence.  ?      ?Recommendations for follow up therapy are one component of a multi-disciplinary discharge planning process, led by the attending physician.  Recommendations may be updated based on patient status, additional functional criteria and insurance authorization. ? ?Follow Up Recommendations ? Skilled nursing-short term rehab (<3 hours/day) ?  ?  ?Assistance Recommended at Discharge Frequent or constant Supervision/Assistance  ?Patient can return home with the following Two people to help with walking and/or transfers;Two people to help with bathing/dressing/bathroom;Assistance with cooking/housework;Assist for transportation;Help with stairs or ramp for entrance ?  ?Equipment Recommendations ? Rolling walker (2 wheels);Wheelchair (measurements PT);Wheelchair cushion (measurements PT);Hospital bed  ?  ?Recommendations for  Other Services   ? ? ?  ?Precautions / Restrictions Precautions ?Precautions: Fall ?Precaution Comments: VERY Large pendulous hernia of LLQ of abdomen ?Restrictions ?Weight Bearing Restrictions: No ?Other Position/Activity Restrictions: Pt wears specialized shoes whenever out of bed  ?  ? ?Mobility ? Bed Mobility ?Overal bed mobility: Needs Assistance ?Bed Mobility: Supine to Sit, Sit to Supine ?  ?  ?Supine to sit: Max assist, +2 for physical assistance ?Sit to supine: Max assist, +2 for physical assistance ?  ?General bed mobility comments: heavy max assist to sit up ?  ? ?Transfers ?Overall transfer level: Needs assistance ?Equipment used: Rolling walker (2 wheels) ?Transfers: Sit to/from Stand ?Sit to Stand: Mod assist, +2 physical assistance, +2 safety/equipment, From elevated surface ?  ?  ?  ?  ?  ?General transfer comment: MIN-MOD A +2, put able to clear buttocks but unable to achieve full standing position x 2-3 reps ?  ? ?Ambulation/Gait ?  ?  ?  ?  ?  ?  ?  ?General Gait Details: pt unable to take any steps with 2 assist and walker use ? ? ?Stairs ?  ?  ?  ?  ?  ? ? ?Wheelchair Mobility ?  ? ?Modified Rankin (Stroke Patients Only) ?  ? ? ?  ?Balance Overall balance assessment: Needs assistance ?Sitting-balance support: Feet supported ?Sitting balance-Leahy Scale: Fair ?Sitting balance - Comments: pt progressed to sup on edge of bed, max A required intially ?  ?Standing balance support: Bilateral upper extremity supported, During functional activity, Reliant on assistive device for balance ?Standing balance-Leahy Scale: Poor ?Standing balance comment: unable to get fully upright in standing ?  ?  ?  ?  ?  ?  ?  ?  ?  ?  ?  ?  ? ?  ?  Cognition Arousal/Alertness: Awake/alert ?Behavior During Therapy: [facility] for tasks assessed/performed ?Overall Cognitive Status: Within Functional Limits for tasks assessed ?  ?  ?  ?  ?  ?  ?  ?  ?  ?  ?  ?  ?  ?  ?  ?  ?  ?  ?  ? ?  ?Exercises   ? ?  ?General Comments   ?  ?   ? ?Pertinent Vitals/Pain Pain Assessment ?Pain Score: 8  ?Faces Pain Scale: Hurts even more ?Pain Location: B LEs, R> L ?Pain Descriptors / Indicators: Grimacing, Guarding, Moaning, Sore ?Pain Intervention(s): Monitored during session, Repositioned  ? ? ?Home Living   ?  ?  ?  ?  ?  ?  ?  ?  ?  ?   ?  ?Prior Function    ?  ?  ?   ? ?PT Goals (current goals can now be found in the care plan section) Acute Rehab PT Goals ?Patient Stated Goal: to improve strength and mobility ?PT Goal Formulation: With patient ?Time For Goal Achievement: 04/01/21 ?Potential to Achieve Goals: Fair ?Progress towards PT goals: Progressing toward goals ? ?  ?Frequency ? ? ? Min 2X/week ? ? ? ?  ?PT Plan Current plan remains appropriate  ? ? ?Co-evaluation PT/OT/SLP Co-Evaluation/Treatment: Yes ?Reason for Co-Treatment: For patient/therapist safety;To address functional/ADL transfers ?PT goals addressed during session: Mobility/safety with mobility;Balance;Proper use of DME ?  ?  ? ?  ?AM-PAC PT "6 Clicks" Mobility   ?Outcome Measure ? Help needed turning from your back to your side while in a flat bed without using bedrails?: A Lot ?Help needed moving from lying on your back to sitting on the side of a flat bed without using bedrails?: A Lot ?Help needed moving to and from a bed to a chair (including a wheelchair)?: Total ?Help needed standing up from a chair using your arms (e.g., wheelchair or bedside chair)?: Total ?Help needed to walk in hospital room?: Total ?Help needed climbing 3-5 steps with a railing? : Total ?6 Click Score: 8 ? ?  ?End of Session Equipment Utilized During Treatment: Gait belt ?Activity Tolerance: Patient limited by fatigue;Patient limited by pain ?Patient left: in bed;with call bell/phone within reach ?Nurse Communication: Mobility status;Precautions ?PT Visit Diagnosis: Other abnormalities of gait and mobility (R26.89);Muscle weakness (generalized) (M62.81);History of falling (Z91.81);Pain ?Pain - Right/Left:   (B) ?Pain - part of body: Leg ?  ? ? ?Time: 1420-1501 ?PT Time Calculation (min) (ACUTE ONLY): 41 min ? ?Charges:  $Therapeutic Activity: 8-22 mins          ?          ? ?Amanda Cockayne, PT, GCS ?03/23/21,4:00 PM ? ? ?

## 2021-03-23 NOTE — Progress Notes (Signed)
?PROGRESS NOTE ? ? ? ?Selena Rodgers  MHD:[id number] DOB: 1950/07/05 DOA: 03/16/2021 ?PCP: [name], MD  ? ? ?Brief Narrative:  ?71 y.o. female with medical history significant of arthritis and endometrial cancer, dm II, HTN.  Patient is receiving radiation treatment at the cancer center.  She felt extreme fatigued at home.  Could not go for radiation treatment on the day of admission.  She also mentioned cough shortness of breath.  She was found to have hyponatremia.  CT scan raised concern for pneumonia.  She was hospitalized for further management.  There was also some concern for cellulitis in her left abdominal pannus.  Started on antibiotics.  Found to have Staph epidermidis bacteremia.  Initially thought to be contaminant but repeat blood cultures also showed similar organism.  ID was consulted.  On vancomycin. ? ? ?Assessment & Plan: ?  ?Principal Problem: ?  CAP (community acquired pneumonia) ?Active Problems: ?  Cellulitis of abdominal wall ?  Bacteremia ?  Generalized weakness ?  Endometrial cancer ([place]) ?  Hyponatremia ?  Anemia ?  Diabetes ([place]) ?  Back pain ?  GERD (gastroesophageal reflux disease) ?  Current tobacco use ?  Cellulitis ? ? ?CAP (community acquired pneumonia) ?Patient presented with generalized weakness cough shortness of breath.  Chest x-ray did not show any infiltrate but CT scan did raise concern for pneumonia.  ?Required oxygen briefly. ?Remains afebrile.  WBC is normal. ?Ceftriaxone and azithromycin for 5 days.  Respiratory status has improved.  Occasional cough.  Saturating normal on room air.  No further antibiotics ?  ?Bacteremia ?Staphylococcus epidermidis bacteremia ?  ?BC ID did turn positive for Staph epidermidis.  Repeat set also reveals Staph epidermidis.  Patient started on vancomycin.   ?Plan: ?Continue IV vancomycin ?ID follow-up ?[facility] cardiology consulted for TEE ?If patient agreeable and TEE negative we may be able to minimize the amount of antibiotic this  patient will need ?We will likely need a PICC line.  Awaiting formal infectious disease clearance ? ?Cellulitis of abdominal wall ?Patient has maceration of the skin over the left abdomen/pannus.  Erythema was noted suggesting cellulitis.  No clear abscess. ?Seen by wound care.  Wound care dressing changes to be performed. ?Plan: ?Vancomycin as above ?Local wound care ?  ?Endometrial cancer ([place]) ?Has been receiving radiation treatment.  Supposed to end March 29.  ?Oncology was notified of admission.  Radiation treatments on hold.  ?Patient mentioned that she passed 2 small clots vaginally.  Hemoglobin stable. ?Noted to be on megestrol.  This was started on admission.  Considering its prothrombotic tendency will discontinue ?  ?Generalized weakness ?Hypokalemia ?  ?Likely due to dehydration, pneumonia, anemia.  Vital signs are stable.  Lactic acid level was normal.  CK was noted to be elevated at 819, improved to 709.  ?Needs a skilled nursing facility for rehabilitation per physical therapy. ?Continue to replete potassium. ?  ?Hyponatremia ?Previously normal.  Likely due to dehydration.  ?Sodium levels low but stable.   ?Urine osmolality is 970.  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  Urine sodium was 43.   ?Continue with fluid restriction and salt tablets.  Sodium level slightly better today.  Stable for the most part.  Continue to monitor. ?  ?Diabetes ([place]) ?Monitor CBGs.  Continue SSI. ?  ?  ?Anemia ?Anemia likely multifactorial.  Previously has had acute on chronic blood loss anemia from vaginal bleeding.  She has required blood transfusion. ?Now there is some element of  anemia of chronic disease.  Hemoglobin low but stable.  Continue to monitor.  ?  ?Back pain ?Patient with chronic history of back pain but has noticed worsening leg pain as well as leg weakness in the last few days.  Denies any falls in her back.  She is able to move her legs but has diminished strength.   ?She has had spinal fusion surgery  previously.  [name] in [place] is her neurosurgeon. ?MRI of the lumbar spine was done on 3/18 which does show significant disease but no different from previous MRI which was done in 2021.  Patient and sister reassured.  Continue to monitor.  Physical therapy.  Pain control.   ?  ?  ?GERD (gastroesophageal reflux disease) ?Vomiting ?Patient continues to endorse reflux of food into the back of the throat.  Was started on IV PPI 3/21.  Repeat CT scan reassuring.  Abdominal exam reassuring ?Plan: ?P.o. PPI ?P.o. H2 blocker ?P.o. simethicone ?As needed Mylanta ?As needed antiemetics ? ?  ?Current tobacco use ?Nicotine patch. ?  ?  ?  ?Morbid obesity ?Estimated body mass index is 44.64 kg/m? as calculated from the following: ?  Height as of this encounter: '5\' 7"'$  (1.702 m). ?  Weight as of this encounter: 129.3 kg. ?This complicates overall care and prognosis ? ?DVT prophylaxis: SQ heparin ?Code Status: Full ?Family Communication: [name] [phone] on 3/22 ?Disposition Plan: Status is: Inpatient ?Remains inpatient appropriate because: Intractable pain, decreased ability to ambulate, will likely need TEE and PICC line placement prior to disposition planning. ?  ?Level of care: Med-Surg ? ?Consultants:  ?ID ? ?Procedures:  ?None ? ?Antimicrobials: ?IV vancomycin ? ? ?Subjective: ?Seen and examined.  Appears fatigued.  Reports weakness and pain with ambulation or exertion. ? ?Objective: ?Vitals:  ? 03/22/21 1734 03/22/21 2005 03/23/21 5681 03/23/21 2751  ?BP: 133/77 (!) 107/41 (!) 129/39 119/63  ?Pulse: 91 91 95 97  ?Resp: '18 20 18 16  '$ ?Temp:  98 ?F (36.7 ?C) 97.6 ?F (36.4 ?C) 97.9 ?F (36.6 ?C)  ?TempSrc:    Oral  ?SpO2: 92% 95% 96% 94%  ?Weight:      ?Height:      ? ? ?Intake/Output Summary (Last 24 hours) at 03/23/2021 1207 ?Last data filed at 03/23/2021 0601 ?Gross per 24 hour  ?Intake --  ?Output 400 ml  ?Net -400 ml  ? ?Filed Weights  ? 03/16/21 2315  ?Weight: 129.3 kg  ? ? ?Examination: ? ?General  exam: NAD ?Respiratory system: Coarse breath sounds bilaterally.  Normal work of breathing.  Room air ?Cardiovascular system: S1-S2, RRR, no murmurs, no pedal edema ?Gastrointestinal system: Obese, significant left periumbilical hernia, erythema over pannus ?Central nervous system: Alert and oriented. No focal neurological deficits. ?Extremities: Decreased range of motion bilateral lower extremities ?Skin: No rashes, lesions or ulcers ?Psychiatry: Judgement and insight appear normal. Mood & affect appropriate.  ? ? ? ?Data Reviewed: I have personally reviewed following labs and imaging studies ? ?CBC: ?Recent Labs  ?Lab 03/16/21 ?2329 03/17/21 ?0416 03/19/21 ?7001 03/20/21 ?7494 03/21/21 ?0423 03/22/21 ?0300 03/23/21 ?4967  ?WBC 8.4   < > 3.4* 5.2 6.6 8.4 11.5*  ?NEUTROABS 7.6  --   --   --   --   --  9.6*  ?HGB 9.7*   < > 8.3* 8.6* 9.2* 9.4* 9.9*  ?HCT 31.1*   < > 26.1* 26.9* 28.3* 29.1* 31.2*  ?MCV 87.6   < > 84.2 84.3 83.0 82.2 84.1  ?PLT  153   < > 120* 142* 149* 245 297  ? < > = values in this interval not displayed.  ? ?Basic Metabolic Panel: ?Recent Labs  ?Lab 03/16/21 ?2329 03/17/21 ?6213 03/18/21 ?0865 03/19/21 ?7846 03/20/21 ?9629 03/21/21 ?0423 03/22/21 ?0300 03/23/21 ?5284  ?NA 127*   < > 130* 129* 130* 129* 132* 131*  ?K 3.9   < > 3.2* 3.5 3.6 3.5 3.4* 3.8  ?CL 96*   < > 99 102 102 100 99 97*  ?CO2 19*   < > 20* 19* 20* 21* 22 23  ?GLUCOSE 189*   < > 145* 151* 140* 134* 137* 153*  ?BUN 15   < > '15 14 11 12 15 19  '$ ?CREATININE 0.77   < > 0.75 0.64 0.51 0.52 0.59 0.76  ?CALCIUM 8.8*   < > 8.7* 8.4* 8.7* 8.7* 8.9 8.8*  ?MG 1.8  --  2.1  --   --   --   --   --   ? < > = values in this interval not displayed.  ? ?GFR: ?Estimated Creatinine Clearance: 94.2 mL/min (by C-G formula based on SCr of 0.76 mg/dL). ?Liver Function Tests: ?Recent Labs  ?Lab 03/16/21 ?2329 03/17/21 ?0416  ?AST 28 30  ?ALT 14 13  ?ALKPHOS 50 42  ?BILITOT 0.7 0.5  ?PROT 7.8 6.8  ?ALBUMIN 3.4* 3.1*  ? ?No results for input(s): LIPASE,  AMYLASE in the last 168 hours. ?No results for input(s): AMMONIA in the last 168 hours. ?Coagulation Profile: ?Recent Labs  ?Lab 03/16/21 ?2329  ?INR 1.3*  ? ?Cardiac Enzymes: ?Recent Labs  ?Lab 03/16/21 ?2329 03/18/2

## 2021-03-23 NOTE — Progress Notes (Signed)
Occupational Therapy Treatment ?Patient Details ?Name: Selena Rodgers ?MRN: [id number] ?DOB: 1950/10/27 ?Today's Date: 03/23/2021 ? ? ?History of present illness Pt is a 71 y.o. female presenting to hospital 3/16 for evaluation of weakness (slipped forward out of recliner and unable to get back up).  Pt admitted with generalized weakness, CAP, anemia, SOB, hyponatremia, and endometrial CA (currently receiving treatment).  Concern for cellulitis in L abdominal pannus.  Pt with large pendulous hernia of LLQ of abdomen.  PMH includes endometrial CA (daily radiation), DM, htn, anxiety, chronic back pain, spinal stenosis, uterine CA, ventral and umbilical hernia repair, ACDF, CTR, and foot surgery. ?  ?OT comments ? Chart reviewed to date, pt greeted in bed in current care of PT. Tx session targeted progressing strength/endurance in the setting of ADL tasks/ADL mobility in order to improve participation and independent during ADL task completion. Pt required MAX A initially while seated on EOB, progressed to supervision with fair-good dynamic sitting balance as evidenced by washing face and applying deodorant with supv-CGA. Attempted to stand on 2 attempts on this date requiring MIN-MOD A +2 to clear bottom from bed, however pt unable to achieve full standing. Pt is left as received, NAD, all needs met. Discharge recommendations remain appropriate. OT will continue to follow acutely.   ? ?Recommendations for follow up therapy are one component of a multi-disciplinary discharge planning process, led by the attending physician.  Recommendations may be updated based on patient status, additional functional criteria and insurance authorization. ?   ?Follow Up Recommendations ? Skilled nursing-short term rehab (<3 hours/day)  ?  ?Assistance Recommended at Discharge Frequent or constant Supervision/Assistance  ?Patient can return home with the following ? Two people to help with walking and/or transfers;A lot of help with  bathing/dressing/bathroom;Help with stairs or ramp for entrance;Assist for transportation ?  ?Equipment Recommendations ? Other (comment) (per next venue of care)  ?  ?Recommendations for Other Services   ? ?  ?Precautions / Restrictions Precautions ?Precautions: Fall ?Precaution Comments: Large pendulous hernia of LLQ of abdomen ?Restrictions ?Weight Bearing Restrictions: No ?Other Position/Activity Restrictions: Pt wears specialized shoes whenever out of bed  ? ? ?  ? ?Mobility Bed Mobility ?Overal bed mobility: Needs Assistance ?Bed Mobility: Supine to Sit, Sit to Supine ?  ?  ?Supine to sit: Max assist, +2 for physical assistance ?Sit to supine: +2 for physical assistance ?  ?  ?  ? ?Transfers ?Overall transfer level: Needs assistance ?Equipment used: Rolling walker (2 wheels) ?Transfers: Sit to/from Stand ?Sit to Stand: From elevated surface, Min assist, +2 physical assistance, Mod assist ?  ?  ?  ?  ?  ?General transfer comment: MIN-MOD A +2, put able to clear buttocks but unable to achieve full standing position ?  ?  ?Balance Overall balance assessment: Needs assistance ?Sitting-balance support: Bilateral upper extremity supported, Feet supported ?Sitting balance-Leahy Scale: Fair ?Sitting balance - Comments: pt progressed to sup on edge of bed, max A required intially ?  ?  ?  ?  ?  ?  ?  ?  ?  ?  ?  ?  ?  ?  ?  ?   ? ?ADL either performed or assessed with clinical judgement  ? ?ADL Overall ADL's : Needs assistance/impaired ?  ?  ?  ?  ?  ?  ?  ?  ?  ?  ?  ?  ?  ?  ?  ?  ?  ?  ?  ?General  ADL Comments: UB grooming tasks with SET UP at edge of bed ?  ? ?Extremity/Trunk Assessment   ?  ?  ?  ?  ?  ? ?Vision   ?  ?  ?Perception   ?  ?Praxis   ?  ? ?Cognition Arousal/Alertness: Awake/alert ?Behavior During Therapy: [facility] for tasks assessed/performed ?Overall Cognitive Status: Within Functional Limits for tasks assessed ?  ?  ?  ?  ?  ?  ?  ?  ?  ?  ?  ?  ?  ?  ?  ?  ?  ?  ?  ?   ?Exercises   ? ?  ?Shoulder  Instructions   ? ? ?  ?General Comments    ? ? ?Pertinent Vitals/ Pain       Pain Assessment ?Pain Assessment: Faces ?Faces Pain Scale: Hurts little more ?Pain Location: generalized ?Pain Descriptors / Indicators: Grimacing ?Pain Intervention(s): Limited activity within patient's tolerance, Monitored during session, Repositioned ? ?Home Living   ?  ?  ?  ?  ?  ?  ?  ?  ?  ?  ?  ?  ?  ?  ?  ?  ?  ?  ? ?  ?Prior Functioning/Environment    ?  ?  ?  ?   ? ?Frequency ? Min 3X/week  ? ? ? ? ?  ?Progress Toward Goals ? ?OT Goals(current goals can now be found in the care plan section) ? Progress towards OT goals: Progressing toward goals ? ?Acute Rehab OT Goals ?Patient Stated Goal: progress in therapy ?OT Goal Formulation: With patient ?Time For Goal Achievement: 04/06/21 ?Potential to Achieve Goals: Good  ?Plan Discharge plan remains appropriate;Frequency remains appropriate   ? ?Co-evaluation ? ? ?   ?  ?  ?  ?  ? ?  ?AM-PAC OT "6 Clicks" Daily Activity     ?Outcome Measure ? ? Help from another person eating meals?: None ?Help from another person taking care of personal grooming?: A Little ?Help from another person toileting, which includes using toliet, bedpan, or urinal?: Total ?Help from another person bathing (including washing, rinsing, drying)?: Total ?Help from another person to put on and taking off regular upper body clothing?: A Lot ?Help from another person to put on and taking off regular lower body clothing?: Total ?6 Click Score: 12 ? ?  ?End of Session Equipment Utilized During Treatment: Rolling walker (2 wheels) ? ?OT Visit Diagnosis: Unsteadiness on feet (R26.81);History of falling (Z91.81);Repeated falls (R29.6);Muscle weakness (generalized) (M62.81) ?  ?Activity Tolerance Patient limited by pain ?  ?Patient Left in bed;with call bell/phone within reach ?  ?Nurse Communication Mobility status ?  ? ?   ? ?Time: 1444-1500 ?OT Time Calculation (min): 16 min ? ?Charges: OT General Charges ?$OT Visit: 1  Visit ?OT Treatments ?$Self Care/Home Management : 8-22 mins ? ?Shanon Payor, OTD OTR/L  ?03/23/21, 3:18 PM  ?

## 2021-03-23 NOTE — Progress Notes (Addendum)
? ?  Date of Admission:  03/16/2021    ? ?ID: Selena Rodgers is a 71 y.o. 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 ? ?Presented to [facility] ED on 03/16/21 with weakness and fall  ?In the ED temp 99.6, BP 135/66, HR 109 ?HB 9.7, WBC 8.4, Na 127, cr 0.77 ?BC- 03/16/21- MRSE ?3/16 BC- MRSE ?3/19 BC- NG ?Jan 9.2016 abdominal wound- MSSA ? ? ? ?Subjective: ?Pt says she is tired ?Did not get to sleep well ?Some cough ?Weakness ?Not able to work with PT ? ?Medications:  ? citalopram  20 mg Oral Daily  ? famotidine  20 mg Oral BID  ? heparin  5,000 Units Subcutaneous Q8H  ? insulin aspart  0-9 Units Subcutaneous TID WC  ? insulin aspart  2 Units Subcutaneous TID WC  ? mirtazapine  15 mg Oral QHS  ? pantoprazole  40 mg Oral Daily  ? senna-docusate  2 tablet Oral QHS  ? sodium chloride  1 g Oral BID WC  ? ? ?Objective: ?Vital signs in last 24 hours: ?Temp:  [97.6 ?F (36.4 ?C)-98 ?F (36.7 ?C)] 97.9 ?F (36.6 ?C) (03/22 9169) ?Pulse Rate:  [91-97] 97 (03/22 0752) ?Resp:  [16-20] 16 (03/22 0752) ?BP: (107-133)/(39-77) 119/63 (03/22 4503) ?SpO2:  [92 %-96 %] 94 % (03/22 0752) ? ?PHYSICAL EXAM:  ?General: Alert, cooperative, pale, chronically ill ?obese ?Head: Normocephalic, without obvious abnormality, atraumatic. ?Eyes: Conjunctivae clear, anicteric sclerae. Pupils are equal ?Back: lumbar scar healthy ?Lungs: b/l air entry ?Heart: s1s2 ?Abdomen: Soft, large  paraumbilical hernia- cannot be reduced ?Superficial ulcers and wounds on the abdominal wall ? ? ? ? ?Extremities: atraumatic, no cyanosis. No edema. No clubbing ?Skin: No rashes or lesions. Or bruising ?Lymph: Cervical, supraclavicular normal. ?Neurologic: not examined in detail ? ?Lab Results ?Recent Labs  ?  03/22/21 ?0300 03/23/21 ?8882  ?WBC 8.4 11.5*  ?HGB 9.4* 9.9*  ?HCT 29.1* 31.2*  ?NA 132* 131*  ?K 3.4* 3.8  ?CL 99 97*  ?CO2 22 23  ?BUN 15 19  ?CREATININE 0.59 0.76  ?Microbiology: ?[facility]- 03/16/21- MRSE ?3/16 BC- MRSE ?3/19 BC- NG ?Jan 9.2016 abdominal wound- MSSA ? ?Radiology ?CXR ? ?Personally reviewed- no infiltrate ?CT chest from 03/17/21 ? ?Patchy infiltrate left lingula ? ?Assessment/Plan: ? ?Staph epidermidis bacteremia- unclear source- could be the superficial ulcers from the abdominal wall ?She has lumbar and cervical fusion/foreign body but not infected ?Will request cardiology for TEE- if that is neg then we can give her a short course of antibiotic. Currently on vanco-may do dapto ? ?Skin ulcers over the paraumbilical pannus of the abdomen- culture sent ? ?Bronchopneumonia left lingula-flu/covid negative- treated with 5 days of Iv ceftriaxone and azithromycin ? ?Weakness -  ? ?Endometrial carcinoma- undergoing radiation ?Hysterectomy planned later ? ?Uterine bleed causing anemia needing Blood transfusion and IV iron as OP ? ?Discussed the management with patient and Dr.Sreenath ? ?  ?

## 2021-03-23 NOTE — TOC Progression Note (Signed)
Transition of Care (TOC) - Progression Note  ? ? ?Patient Details  ?Name: Selena Rodgers ?MRN: [id number] ?Date of Birth: Mar 25, 1950 ? ?Transition of Care (TOC) CM/SW Contact  ?Turner Kunzman A Leliana Kontz, LCSW ?Phone Number: ?03/23/2021, 3:11 PM ? ?Clinical Narrative:   CSW received a call back from pt's sister and was told to proceed with getting auth for [facility]. Josem Kaufmann will be started today. ? ? ? ?  ?  ? ?Expected Discharge Plan and Services ?  ?  ?  ?  ?  ?                ?  ?  ?  ?  ?  ?  ?  ?  ?  ?  ? ? ?Social Determinants of Health (SDOH) Interventions ?  ? ?Readmission Risk Interventions ?   ? View : No data to display.  ?  ?  ?  ? ? ?

## 2021-03-24 ENCOUNTER — Ambulatory Visit: Payer: Medicare HMO

## 2021-03-24 ENCOUNTER — Inpatient Hospital Stay: Payer: Self-pay

## 2021-03-24 ENCOUNTER — Inpatient Hospital Stay (HOSPITAL_COMMUNITY)
Admit: 2021-03-24 | Discharge: 2021-03-24 | Disposition: A | Discharge status: Home / Self Care | Payer: Medicare HMO | Attending: Nurse Practitioner | Admitting: Nurse Practitioner

## 2021-03-24 ENCOUNTER — Encounter: Payer: Self-pay | Admitting: Cardiovascular Disease

## 2021-03-24 ENCOUNTER — Inpatient Hospital Stay: Payer: Medicare HMO | Admitting: Anesthesiology

## 2021-03-24 ENCOUNTER — Inpatient Hospital Stay: Payer: Medicare HMO

## 2021-03-24 ENCOUNTER — Encounter
Admission: EM | Disposition: A | Discharge status: Skilled Nursing Facility | Payer: Self-pay | Source: Home / Self Care | Attending: Internal Medicine

## 2021-03-24 ENCOUNTER — Encounter: Payer: Self-pay | Admitting: Nurse Practitioner

## 2021-03-24 DIAGNOSIS — I340 Nonrheumatic mitral (valve) insufficiency: Secondary | ICD-10-CM | POA: Diagnosis not present

## 2021-03-24 DIAGNOSIS — B957 Other staphylococcus as the cause of diseases classified elsewhere: Secondary | ICD-10-CM | POA: Diagnosis not present

## 2021-03-24 DIAGNOSIS — R7881 Bacteremia: Secondary | ICD-10-CM

## 2021-03-24 DIAGNOSIS — C541 Malignant neoplasm of endometrium: Secondary | ICD-10-CM

## 2021-03-24 DIAGNOSIS — I361 Nonrheumatic tricuspid (valve) insufficiency: Secondary | ICD-10-CM

## 2021-03-24 DIAGNOSIS — N939 Abnormal uterine and vaginal bleeding, unspecified: Secondary | ICD-10-CM

## 2021-03-24 DIAGNOSIS — J189 Pneumonia, unspecified organism: Secondary | ICD-10-CM | POA: Diagnosis not present

## 2021-03-24 HISTORY — PX: TEE WITHOUT CARDIOVERSION: SHX5443

## 2021-03-24 LAB — GLUCOSE, CAPILLARY
Glucose-Capillary: 155 mg/dL — ABNORMAL HIGH (ref 70–99)
Glucose-Capillary: 165 mg/dL — ABNORMAL HIGH (ref 70–99)
Glucose-Capillary: 163 mg/dL — ABNORMAL HIGH (ref 70–99)
Glucose-Capillary: 152 mg/dL — ABNORMAL HIGH (ref 70–99)
Glucose-Capillary: 168 mg/dL — ABNORMAL HIGH (ref 70–99)

## 2021-03-24 LAB — CREATININE, SERUM
Creatinine, Ser: 0.72 mg/dL (ref 0.44–1.00)
GFR, Estimated: >60 mL/min (ref >60)

## 2021-03-24 SURGERY — ECHOCARDIOGRAM, TRANSESOPHAGEAL
Anesthesia: General

## 2021-03-24 MED ORDER — SODIUM CHLORIDE FLUSH 0.9 % IV SOLN
INTRAVENOUS | Status: AC
Start: 2021-03-24 — End: 2021-03-24
  Administered 2021-03-24: 10 mL
  Filled 2021-03-24: qty 10

## 2021-03-24 MED ORDER — GABAPENTIN 600 MG PO TABS
300.0000 mg | ORAL_TABLET | Freq: Two times a day (BID) | ORAL | Status: DC
  Administered 2021-03-24 – 2021-03-26 (×6): 300 mg via ORAL
  Filled 2021-03-24 (×6): qty 1

## 2021-03-24 MED ORDER — PROPOFOL 500 MG/50ML IV EMUL
INTRAVENOUS | Status: AC
  Filled 2021-03-24: qty 100

## 2021-03-24 MED ORDER — FENTANYL CITRATE (PF) 100 MCG/2ML IJ SOLN
INTRAMUSCULAR | Status: AC
  Filled 2021-03-24: qty 2

## 2021-03-24 MED ORDER — SODIUM CHLORIDE 0.9% FLUSH
10.0000 mL | Freq: Two times a day (BID) | INTRAVENOUS | Status: DC
  Administered 2021-03-25 – 2021-03-29 (×9): 10 mL

## 2021-03-24 MED ORDER — PROPOFOL 10 MG/ML IV BOLUS
INTRAVENOUS | Status: DC | PRN
  Administered 2021-03-24 (×6): 20 mg via INTRAVENOUS

## 2021-03-24 MED ORDER — SODIUM CHLORIDE 0.9 % IV SOLN: INTRAVENOUS | Status: DC

## 2021-03-24 MED ORDER — SODIUM CHLORIDE 0.9% FLUSH: 10.0000 mL | INTRAVENOUS | Status: DC | PRN

## 2021-03-24 MED ORDER — BUTAMBEN-TETRACAINE-BENZOCAINE 2-2-14 % EX AERO
INHALATION_SPRAY | CUTANEOUS | Status: AC
  Filled 2021-03-24: qty 5

## 2021-03-24 MED ORDER — MIDAZOLAM HCL 2 MG/2ML IJ SOLN
INTRAMUSCULAR | Status: AC
  Filled 2021-03-24: qty 4

## 2021-03-24 MED ORDER — LIDOCAINE VISCOUS HCL 2 % MT SOLN
OROMUCOSAL | Status: AC
Start: 2021-03-24 — End: 2021-03-24
  Filled 2021-03-24: qty 15

## 2021-03-24 MED ORDER — CHLORHEXIDINE GLUCONATE CLOTH 2 % EX PADS
6.0000 | MEDICATED_PAD | Freq: Every day | CUTANEOUS | Status: DC
  Administered 2021-03-25 – 2021-03-29 (×6): 6 via TOPICAL

## 2021-03-24 NOTE — Anesthesia Preprocedure Evaluation (Signed)
Anesthesia Evaluation  ?Patient identified by MRN, date of birth, ID band ?Patient awake ? ? ? ?Reviewed: ?Allergy & Precautions, H&P , NPO status , Patient's Chart, lab work & pertinent test results, reviewed documented beta blocker date and time  ? ?Airway ?Mallampati: III ? ? ?Neck ROM: full ? ? ? Dental ? ?(+) Poor Dentition, Teeth Intact ?  ?Pulmonary ?neg pulmonary ROS, pneumonia, unresolved, Current Smoker,  ?  ?Pulmonary exam normal ? ? ? ? ? ? ? Cardiovascular ?Exercise Tolerance: Poor ?hypertension, On Medications ?negative cardio ROS ?Normal cardiovascular exam ?Rhythm:regular Rate:Normal ? ? ?  ?Neuro/Psych ?PSYCHIATRIC DISORDERS Anxiety Depression negative neurological ROS ? negative psych ROS  ? GI/Hepatic ?Neg liver ROS, GERD  Medicated,  ?Endo/Other  ?negative endocrine ROSdiabetes, Poorly Controlled, Type 2, Oral Hypoglycemic Agents ? Renal/GU ?negative Renal ROS  ?negative genitourinary ?  ?Musculoskeletal ? ? Abdominal ?  ?Peds ? Hematology ? ?(+) Blood dyscrasia, anemia ,   ?Anesthesia Other Findings ?Past Medical History: ?No date: Anxiety ?No date: Arthritis ?No date: Chronic back pain ?03/17/2021: Diabetes ([place]) ?02/09/2021: Endometrial cancer ([place]) ?No date: GERD (gastroesophageal reflux disease) ?No date: Hypertension ?No date: Post-menopausal bleeding ?No date: Spinal stenosis ?No date: Umbilical hernia ?No date: Uterine cancer ([place]) ?No date: Ventral hernia ?Past Surgical History: ?04/19/2015: ANTERIOR CERVICAL DECOMP/DISCECTOMY FUSION; N/A ?    Comment:  Procedure: ANTERIOR CERVICAL DECOMPRESSION/DISCECTOMY  ?             FUSION INTERBODY PROTHESIS PLATING BONEGRAFT CERVICAL  ?             THREE-FOUR ,CERVICAL FOUR-FIVE;  Surgeon: Dellis Filbert  ?             Arnoldo Morale, MD;  Location: [facility];  Service:  ?             Neurosurgery;  Laterality: N/A; ?No date: BREAST BIOPSY ?No date: CARPAL TUNNEL RELEASE ?No date: CHOLECYSTECTOMY ?No date: FOOT SURGERY;  Right ?01/10/2014: IRRIGATION AND DEBRIDEMENT ABDOMEN ?    Comment:  Dr. Marina Gravel ?No date: UMBILICAL HERNIA REPAIR ?12/26/2013: VENTRAL HERNIA REPAIR ?    Comment:  Dr. Pat Patrick ?07/14/2019: WOUND EXPLORATION; N/A ?    Comment:  Procedure: LUMBAR EXPLORATION EVACUATION OF HEMATOMA;   ?             Surgeon: Newman Pies, MD;  Location: [facility];   ?             Service: Neurosurgery;  Laterality: N/A; ?BMI   ? Body Mass Index: 44.64 kg/m?  ?  ? Reproductive/Obstetrics ?negative OB ROS ? ?  ? ? ? ? ? ? ? ? ? ? ? ? ? ?  ?  ? ? ? ? ? ? ? ? ?Anesthesia Physical ?Anesthesia Plan ? ?ASA: 4 and emergent ? ?Anesthesia Plan: General  ? ?Post-op Pain Management:   ? ?Induction:  ? ?PONV Risk Score and Plan:  ? ?Airway Management Planned:  ? ?Additional Equipment:  ? ?Intra-op Plan:  ? ?Post-operative Plan:  ? ?Informed Consent: I have reviewed the patients History and Physical, chart, labs and discussed the procedure including the risks, benefits and alternatives for the proposed anesthesia with the patient or authorized representative who has indicated his/her understanding and acceptance.  ? ? ? ?Dental Advisory Given ? ?Plan Discussed with: CRNA ? ?Anesthesia Plan Comments: (Pt is deemed relatively high risk secondary to sepsis and underlying pulmonary status. Per discussion with Dr. Rockey Situ, patient's procedure is of an urgent nature to help  rule out cardiac and valvular etiology for patients ?Current condition. We will assist accordingly. JA)  ? ? ? ? ? ? ?Anesthesia Quick Evaluation ? ?

## 2021-03-24 NOTE — Progress Notes (Signed)
?PROGRESS NOTE ? ? ? ?Selena Rodgers  UKG:[id number] DOB: 22-Nov-1950 DOA: 03/16/2021 ?PCP: Selena Hire, MD  ? ? ?Brief Narrative:  ?71 y.o. female with medical history significant of arthritis and endometrial cancer, dm II, HTN.  Patient is receiving radiation treatment at the cancer center.  She felt extreme fatigued at home.  Could not go for radiation treatment on the day of admission.  She also mentioned cough shortness of breath.  She was found to have hyponatremia.  CT scan raised concern for pneumonia.  She was hospitalized for further management.  There was also some concern for cellulitis in her left abdominal pannus.  Started on antibiotics.  Found to have Staph epidermidis bacteremia.  Initially thought to be contaminant but repeat blood cultures also showed similar organism.  ID was consulted.  On vancomycin. ? ? ?Assessment & Plan: ?  ?Principal Problem: ?  CAP (community acquired pneumonia) ?Active Problems: ?  Cellulitis of abdominal wall ?  Bacteremia ?  Generalized weakness ?  Endometrial cancer ([place]) ?  Hyponatremia ?  Anemia ?  Diabetes ([place]) ?  Back pain ?  GERD (gastroesophageal reflux disease) ?  Current tobacco use ?  Cellulitis ? ? ?CAP (community acquired pneumonia) ?Patient presented with generalized weakness cough shortness of breath.  Chest x-ray did not show any infiltrate but CT scan did raise concern for pneumonia.  ?Required oxygen briefly. ?Remains afebrile.  WBC is normal. ?Ceftriaxone and azithromycin for 5 days.   ?Respiratory status has improved.  Occasional cough.   ?Saturating normal on room air.  No further CAP coverage ?  ?Bacteremia ?Staphylococcus epidermidis bacteremia ?  ?BC ID did turn positive for Staph epidermidis.  Repeat set also reveals Staph epidermidis.  Patient started on vancomycin.   ?TEE negative on 3/23 ?Plan: ?Continue IV vancomycin for now ?ID follow-up ?Anticipate transition to daptomycin at time of discharge ?Will need 2-week course ?PICC line  ordered ? ?Cellulitis of abdominal wall ?Patient has maceration of the skin over the left abdomen/pannus.  Erythema was noted suggesting cellulitis.  No clear abscess. ?Seen by wound care.  Wound care dressing changes to be performed. ?Plan: ?Vancomycin as above ?Local wound care ?  ?Endometrial cancer ([place]) ?Has been receiving radiation treatment.  Supposed to end March 29.  ?Oncology was notified of admission.  Radiation treatments on hold.  ?Patient mentioned that she passed 2 small clots vaginally.  Hemoglobin stable. ?Noted to be on megestrol.  This was started on admission.  Considering its prothrombotic tendency will discontinue ?  ?Generalized weakness ?Hypokalemia ?  ?Likely due to dehydration, pneumonia, anemia.  Vital signs are stable.  Lactic acid level was normal.  CK was noted to be elevated at 819, improved to 709.  ?Needs a skilled nursing facility for rehabilitation per physical therapy. ?Continue to replete potassium. ?  ?Hyponatremia ?Previously normal.  Likely due to dehydration.  ?Sodium levels low but stable.   ?Urine osmolality is 970.  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  Urine sodium was 43.   ?Continue with fluid restriction and salt tablets.  Sodium level slightly better today.  Stable for the most part.  Continue to monitor. ?  ?Diabetes ([place]) ?Monitor CBGs.  Continue SSI. ?  ?  ?Anemia ?Anemia likely multifactorial.  Previously has had acute on chronic blood loss anemia from vaginal bleeding.  She has required blood transfusion. ?Now there is some element of anemia of chronic disease.  Hemoglobin low but stable.  Continue to monitor.  ?  ?  Back pain ?Patient with chronic history of back pain but has noticed worsening leg pain as well as leg weakness in the last few days.  Denies any falls in her back.  She is able to move her legs but has diminished strength.   ?She has had spinal fusion surgery previously.  Dr. Arnoldo Rodgers in [place] is her neurosurgeon. ?MRI of the lumbar spine was done  on 3/18 which does show significant disease but no different from previous MRI which was done in 2021.  Patient and sister reassured.   ?Plan: ?We will check x-ray right hip rule out fracture/dislocation ?Continue to monitor.  Physical therapy.  Pain control.   ?  ?  ?GERD (gastroesophageal reflux disease) ?Vomiting ?Patient continues to endorse reflux of food into the back of the throat.  Was started on IV PPI 3/21.  Repeat CT scan reassuring.  Abdominal exam reassuring ?Plan: ?P.o. PPI ?P.o. H2 blocker ?P.o. simethicone ?As needed Mylanta ?As needed antiemetics ? ?  ?Current tobacco use ?Nicotine patch. ?  ?  ?  ?Morbid obesity ?Estimated body mass index is 44.64 kg/m? as calculated from the following: ?  Height as of this encounter: '5\' 7"'$  (1.702 m). ?  Weight as of this encounter: 129.3 kg. ?This complicates overall care and prognosis ? ?DVT prophylaxis: SQ heparin ?Code Status: Full ?Family Communication: Selena Rodgers [phone] on 3/22 ?Disposition Plan: Status is: Inpatient ?Remains inpatient appropriate because: Intractable pain, decreased ability to ambulate, PICC line ordered for today.  Anticipate discharge in 24 to 48 hours ?  ?Level of care: Med-Surg ? ?Consultants:  ?ID ? ?Procedures:  ?None ? ?Antimicrobials: ?IV vancomycin ? ? ?Subjective: ?Seen and examined.  Appears fatigued.  Reports weakness and pain with ambulation or exertion. ? ?Objective: ?Vitals:  ? 03/24/21 0900 03/24/21 0910 03/24/21 1002 03/24/21 1120  ?BP: 128/72 127/76 116/79 (!) 153/75  ?Pulse: (!) 110 (!) 111 (!) 109 (!) 110  ?Resp: (!) 21 (!) 21 (!) 25 18  ?Temp:   98.9 ?F (37.2 ?C) 98.4 ?F (36.9 ?C)  ?TempSrc:   Oral   ?SpO2: 94% 94% 96% 94%  ?Weight:      ?Height:      ? ? ?Intake/Output Summary (Last 24 hours) at 03/24/2021 1215 ?Last data filed at 03/24/2021 0107 ?Gross per 24 hour  ?Intake 800 ml  ?Output --  ?Net 800 ml  ? ?Filed Weights  ? 03/16/21 2315  ?Weight: 129.3 kg  ? ? ?Examination: ? ?General exam: No acute  distress ?Respiratory system: Slightly tachypnea.  Coarse breath sounds bilaterally.  Normal work of breathing.  Room air ?Cardiovascular system: S1-S2, RRR, no murmurs, no pedal edema ?Gastrointestinal system: Obese, significant left periumbilical hernia, erythema over pannus ?Central nervous system: Alert and oriented. No focal neurological deficits. ?Extremities: Decreased range of motion bilateral lower extremities ?Skin: No rashes, lesions or ulcers ?Psychiatry: Judgement and insight appear normal. Mood & affect appropriate.  ? ? ? ?Data Reviewed: I have personally reviewed following labs and imaging studies ? ?CBC: ?Recent Labs  ?Lab 03/19/21 ?0605 03/20/21 ?9450 03/21/21 ?0423 03/22/21 ?0300 03/23/21 ?3888  ?WBC 3.4* 5.2 6.6 8.4 11.5*  ?NEUTROABS  --   --   --   --  9.6*  ?HGB 8.3* 8.6* 9.2* 9.4* 9.9*  ?HCT 26.1* 26.9* 28.3* 29.1* 31.2*  ?MCV 84.2 84.3 83.0 82.2 84.1  ?PLT 120* 142* 149* 245 297  ? ?Basic Metabolic Panel: ?Recent Labs  ?Lab 03/18/21 ?0433 03/19/21 ?0605 03/20/21 ?2800 03/21/21 ?0423 03/22/21 ?0300  03/23/21 ?6503 03/24/21 ?0259  ?NA 130* 129* 130* 129* 132* 131*  --   ?K 3.2* 3.5 3.6 3.5 3.4* 3.8  --   ?CL 99 102 102 100 99 97*  --   ?CO2 20* 19* 20* 21* 22 23  --   ?GLUCOSE 145* 151* 140* 134* 137* 153*  --   ?BUN '15 14 11 12 15 19  '$ --   ?CREATININE 0.75 0.64 0.51 0.52 0.59 0.76 0.72  ?CALCIUM 8.7* 8.4* 8.7* 8.7* 8.9 8.8*  --   ?MG 2.1  --   --   --   --   --   --   ? ?GFR: ?Estimated Creatinine Clearance: 94.2 mL/min (by C-G formula based on SCr of 0.72 mg/dL). ?Liver Function Tests: ?No results for input(s): AST, ALT, ALKPHOS, BILITOT, PROT, ALBUMIN in the last 168 hours. ? ?No results for input(s): LIPASE, AMYLASE in the last 168 hours. ?No results for input(s): AMMONIA in the last 168 hours. ?Coagulation Profile: ?No results for input(s): INR, PROTIME in the last 168 hours. ? ?Cardiac Enzymes: ?Recent Labs  ?Lab 03/19/21 ?5465  ?CKTOTAL 709*  ? ?BNP (last 3 results) ?No results for  input(s): PROBNP in the last 8760 hours. ?HbA1C: ?No results for input(s): HGBA1C in the last 72 hours. ?CBG: ?Recent Labs  ?Lab 03/23/21 ?1151 03/23/21 ?1538 03/23/21 ?2043 03/24/21 ?0726 03/24/21 ?1004  ?GLUCAP 160* 18

## 2021-03-24 NOTE — Progress Notes (Signed)
Occupational Therapy Treatment ?Patient Details ?Name: Selena Rodgers ?MRN: [id number] ?DOB: Oct 13, 1950 ?Today's Date: 03/24/2021 ? ? ?History of present illness Pt is a 71 y.o. female presenting to hospital 3/16 for evaluation of weakness (slipped forward out of recliner and unable to get back up).  Pt admitted with generalized weakness, CAP, anemia, SOB, hyponatremia, and endometrial CA (currently receiving treatment).  Concern for cellulitis in L abdominal pannus.  Pt with large pendulous hernia of LLQ of abdomen.  PMH includes endometrial CA (daily radiation), DM, htn, anxiety, chronic back pain, spinal stenosis, uterine CA, ventral and umbilical hernia repair, ACDF, CTR, and foot surgery.  Patient had TEE this morning 3/23 and R hip xray with was negative for fracture or dislocation. ?  ?OT comments ? Pt greeted in bed agreeable to OT tx session. Cotx completed with PT. New orders received from procedure this morning. Pt reports fatigue from previous procedures this morning. Pt requires MAX-TOTAL A for supine<>sit at EOB. Pt initially requires MAX A for static sitting, able to progress to CGA-MIN A for static sitting ADL tasks. Pt requires TOTAL A for rolling tasks. Pt is left as received, NAD, all needs met. Discharge recommendation and goals remain appropriate. OT will continue to follow.   ? ?Recommendations for follow up therapy are one component of a multi-disciplinary discharge planning process, led by the attending physician.  Recommendations may be updated based on patient status, additional functional criteria and insurance authorization. ?   ?Follow Up Recommendations ? Skilled nursing-short term rehab (<3 hours/day)  ?  ?Assistance Recommended at Discharge Frequent or constant Supervision/Assistance  ?Patient can return home with the following ? Two people to help with walking and/or transfers;A lot of help with bathing/dressing/bathroom;Help with stairs or ramp for entrance;Assist for  transportation ?  ?Equipment Recommendations ? Other (comment) (per next venue of care)  ?  ?Recommendations for Other Services   ? ?  ?Precautions / Restrictions Precautions ?Precautions: Fall ?Precaution Comments: VERY Large pendulous hernia of LLQ of abdomen ?Restrictions ?Weight Bearing Restrictions: No ?Other Position/Activity Restrictions: Pt wears specialized shoes whenever out of bed  ? ? ?  ? ?Mobility Bed Mobility ?Overal bed mobility: Needs Assistance ?Bed Mobility: Supine to Sit, Rolling, Sit to Supine ?Rolling: Total assist, +2 for physical assistance ?  ?Supine to sit: Total assist, +2 for physical assistance, HOB elevated ?Sit to supine: Total assist, +2 for physical assistance ?  ?  ?  ? ?Transfers ?  ?  ?  ?  ?  ?  ?  ?  ?  ?General transfer comment: unable to attempt on this date due to fatigue from procedure this morning ?  ?  ?Balance Overall balance assessment: Needs assistance ?Sitting-balance support: Feet supported ?Sitting balance-Leahy Scale: Poor ?Sitting balance - Comments: Initially MAX A seated at EOB, progress to CGA-MIN A during static sitting ADL tasks ?Postural control: Posterior lean ?  ?  ?  ?  ?  ?  ?  ?  ?  ?  ?  ?  ?  ?  ?   ? ?ADL either performed or assessed with clinical judgement  ? ?ADL   ?  ?  ?Grooming: Brushing hair;Maximal assistance;Sitting ?Grooming Details (indicate cue type and reason): pt with CGA-MIN A seated at edge of bed during hair brushing ?  ?  ?  ?  ?Upper Body Dressing : Moderate assistance;Bed level ?  ?Lower Body Dressing: Maximal assistance;Bed level;Total assistance ?  ?  ?  ?Toileting- Clothing  Manipulation and Hygiene: Total assistance ?  ?  ?  ?Functional mobility during ADLs: Maximal assistance;+2 for physical assistance;Total assistance ?  ?  ? ?Extremity/Trunk Assessment   ?  ?  ?  ?  ?  ? ?Vision   ?  ?  ?Perception   ?  ?Praxis   ?  ? ?Cognition Arousal/Alertness: Awake/alert ?Behavior During Therapy: [facility] for tasks assessed/performed ?Overall  Cognitive Status: Within Functional Limits for tasks assessed ?  ?  ?  ?  ?  ?  ?  ?  ?  ?  ?  ?  ?  ?  ?  ?  ?  ?  ?  ?   ?Exercises   ? ?  ?Shoulder Instructions   ? ? ?  ?General Comments    ? ? ?Pertinent Vitals/ Pain       Pain Assessment ?Pain Assessment: Faces ?Faces Pain Scale: Hurts even more ?Pain Location: B LEs, R> L ?Pain Descriptors / Indicators: Grimacing, Guarding, Moaning, Sore ?Pain Intervention(s): Monitored during session, Repositioned ? ?Home Living   ?  ?  ?  ?  ?  ?  ?  ?  ?  ?  ?  ?  ?  ?  ?  ?  ?  ?  ? ?  ?Prior Functioning/Environment    ?  ?  ?  ?   ? ?Frequency ? Min 3X/week  ? ? ? ? ?  ?Progress Toward Goals ? ?OT Goals(current goals can now be found in the care plan section) ? Progress towards OT goals: Progressing toward goals ? ?Acute Rehab OT Goals ?Patient Stated Goal: get stronger ?OT Goal Formulation: With patient ?Time For Goal Achievement: 04/07/21 ?Potential to Achieve Goals: Good  ?Plan Discharge plan remains appropriate;Frequency remains appropriate   ? ?Co-evaluation ? ? ? PT/OT/SLP Co-Evaluation/Treatment: Yes ?Reason for Co-Treatment: For patient/therapist safety;To address functional/ADL transfers ?PT goals addressed during session: Mobility/safety with mobility;Balance ?OT goals addressed during session: ADL's and self-care ?  ? ?  ?AM-PAC OT "6 Clicks" Daily Activity     ?Outcome Measure ? ? Help from another person eating meals?: None ?Help from another person taking care of personal grooming?: A Little ?Help from another person toileting, which includes using toliet, bedpan, or urinal?: Total ?Help from another person bathing (including washing, rinsing, drying)?: Total ?Help from another person to put on and taking off regular upper body clothing?: A Lot ?Help from another person to put on and taking off regular lower body clothing?: Total ?6 Click Score: 12 ? ?  ?End of Session   ? ?OT Visit Diagnosis: Unsteadiness on feet (R26.81);History of falling  (Z91.81);Repeated falls (R29.6);Muscle weakness (generalized) (M62.81) ?  ?Activity Tolerance Patient limited by pain ?  ?Patient Left in bed;with call bell/phone within reach ?  ?Nurse Communication Mobility status ?  ? ?   ? ?Time: 1898-4210 ?OT Time Calculation (min): 24 min ? ?Charges: OT General Charges ?$OT Visit: 1 Visit ?OT Treatments ?$Self Care/Home Management : 8-22 mins ? ?Shanon Payor, OTD OTR/L  ?03/24/21, 3:43 PM  ?

## 2021-03-24 NOTE — TOC Progression Note (Signed)
Transition of Care (TOC) - Progression Note  ? ? ?Patient Details  ?Name: Selena Rodgers ?MRN: [id number] ?Date of Birth: 01/30/1950 ? ?Transition of Care (TOC) CM/SW Contact  ?Shakil Dirk A Allannah Kempen, LCSW ?Phone Number: ?03/24/2021, 3:20 PM ? ?Clinical Narrative:   Josem Kaufmann still pending. CSW did confirm with Debbie at [facility] that they can Daptomycin through PICC at [facility]. MD notified. ? ? ? ?  ?  ? ?Expected Discharge Plan and Services ?  ?  ?  ?  ?  ?                ?  ?  ?  ?  ?  ?  ?  ?  ?  ?  ? ? ?Social Determinants of Health (SDOH) Interventions ?  ? ?Readmission Risk Interventions ?   ? View : No data to display.  ?  ?  ?  ? ? ?

## 2021-03-24 NOTE — Progress Notes (Signed)
Vitals signs rech ? 03/24/21 0010  ?Assess: MEWS Score  ?BP 99/64  ?Pulse Rate (!) 101  ?SpO2 95 %  ?Assess: MEWS Score  ?MEWS Temp 0  ?MEWS Systolic 1  ?MEWS Pulse 1  ?MEWS RR 1  ?MEWS LOC 0  ?MEWS Score 3  ?MEWS Score Color Yellow  ?Assess: if the MEWS score is Yellow or Red  ?Were vital signs taken at a resting state? No  ?Focused Assessment No change from prior assessment ?(pt with tachypnea a bedside shift change)  ?MEWS guidelines implemented *See Row Information* No, vital signs rechecked ?(see 0025)  ?Document  ?Patient Outcome Other (Comment) ?(continuing to monitor)  ?Progress note created (see row info) Yes  ?Assess: SIRS CRITERIA  ?SIRS Temperature  0  ?SIRS Pulse 1  ?SIRS Respirations  1  ?SIRS WBC 0  ?SIRS Score Sum  2  ? ?ecked. Yellow protocol not initiated ?

## 2021-03-24 NOTE — Progress Notes (Signed)
*  PRELIMINARY RESULTS* ?Echocardiogram ?Echocardiogram Transesophageal has been performed. ? ?Selena Rodgers, [name] ?03/24/2021, 8:46 AM ?

## 2021-03-24 NOTE — Progress Notes (Signed)
Peripherally Inserted Central Catheter Placement ? ?The IV Nurse has discussed with the patient and/or persons authorized to consent for the patient, the purpose of this procedure and the potential benefits and risks involved with this procedure.  The benefits include less needle sticks, lab draws from the catheter, and the patient may be discharged home with the catheter. Risks include, but not limited to, infection, bleeding, blood clot (thrombus formation), and puncture of an artery; nerve damage and irregular heartbeat and possibility to perform a PICC exchange if needed/ordered by physician.  Alternatives to this procedure were also discussed.  Bard Power PICC patient education guide, fact sheet on infection prevention and patient information card has been provided to patient /or left at bedside.   ? ?PICC Placement Documentation  ?PICC Single Lumen 08/16/46 Right Cephalic 42 cm 0 cm (Active)  ?Indication for Insertion or Continuance of Line Home intravenous therapies (PICC only) 03/24/21 1815  ?Exposed Catheter (cm) 0 cm 03/24/21 1815  ?Site Assessment Clean, Dry, Intact 03/24/21 1815  ?Line Status Flushed;Saline locked;Blood return noted 03/24/21 1815  ?Dressing Type Securing device;Transparent 03/24/21 1815  ?Dressing Status Antimicrobial disc in place;Clean, Dry, Intact 03/24/21 1815  ?Dressing Change Due 03/31/21 03/24/21 1815  ? ? ? ? ? ?Shon Hale ?03/24/2021, 6:47 PM ? ?

## 2021-03-24 NOTE — Progress Notes (Signed)
? ?Date of Admission:  03/16/2021    ? ?ID: Selena Rodgers is a 71 y.o. 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 ? ?Presented to [facility] ED on 03/16/21 with weakness and fall  ?In the ED temp 99.6, BP 135/66, HR 109 ?HB 9.7, WBC 8.4, Na 127, cr 0.77 ?BC- 03/16/21- MRSE ?3/16 BC- MRSE ?3/19 BC- NG ?Jan 9.2016 abdominal wound- MSSA ? ? ? ?Subjective: ?Pt says she is tired ?Did not get to sleep well ?Some cough ?Weakness ?Not able to work with PT ? ?Medications:  ? citalopram  20 mg Oral Daily  ? famotidine  20 mg Oral BID  ? gabapentin  300 mg Oral BID  ? heparin  5,000 Units Subcutaneous Q8H  ? insulin aspart  0-9 Units Subcutaneous TID WC  ? insulin aspart  2 Units Subcutaneous TID WC  ? lidocaine      ? mirtazapine  15 mg Oral QHS  ? pantoprazole  40 mg Oral Daily  ? senna-docusate  2 tablet Oral QHS  ? simethicone  80 mg Oral QID  ? sodium chloride flush      ? sodium chloride  1 g Oral BID WC  ? ? ?Objective: ?Vital signs in last 24 hours: ?Temp:  [97.9 ?F (36.6 ?C)-99.2 ?F (37.3 ?C)] 97.9 ?F (36.6 ?C) (03/23 1551) ?Pulse Rate:  [100-117] 102 (03/23 1551) ?Resp:  [18-39] 18 (03/23 1551) ?BP: (99-153)/(43-83) 104/54 (03/23 1551) ?SpO2:  [90 %-98 %] 95 % (03/23 1551) ? ?PHYSICAL EXAM:  ?General: Alert, cooperative, pale, chronically ill ?obese ?Head: Normocephalic, without obvious abnormality, atraumatic. ?Eyes: Conjunctivae clear,  anicteric sclerae. Pupils are equal ?Back: lumbar scar healthy ?Lungs: b/l air entry ?Heart: s1s2 ?Abdomen: Soft, large paraumbilical hernia- cannot be reduced ?Superficial ulcers and wounds on the abdominal wall ? ? ? ? ?Extremities: atraumatic, no cyanosis. No edema. No clubbing ?Skin: No rashes or lesions. Or bruising ?Lymph: Cervical, supraclavicular normal. ?Neurologic: not examined in detail ? ?Lab Results ?Recent Labs  ?  03/22/21 ?0300 03/23/21 ?1610 03/24/21 ?0259  ?WBC 8.4 11.5*  --   ?HGB 9.4* 9.9*  --   ?HCT 29.1* 31.2*  --   ?NA 132* 131*  --   ?K 3.4* 3.8  --   ?CL 99 97*  --   ?CO2 22 23  --   ?BUN 15 19  --   ?CREATININE 0.59 0.76 0.72  ?Microbiology: ?[facility]- 03/16/21- MRSE ?3/16 BC- MRSE ?3/19 BC- NG ?Jan 9.2016 abdominal wound- MSSA ? ?Radiology ?CXR ? ?Personally reviewed- no infiltrate ?CT chest from 03/17/21 ? ?Patchy infiltrate left lingula ? ?Assessment/Plan: ? ?Staph epidermidis bacteremia- unclear source- could be the superficial ulcers from the abdominal wall ?She has lumbar and cervical fusion/foreign body . ?MRI of the lumbar spine does not show any infection ?TEE no endocarditis.-We will do a total of 2 weeks of IV antibiotic.  End date is 04/01/2021. ?Currently on vancomycin.  May be able to switch to daptomycin if CPK level is normal ?Patient complains of right hip pain.  X-ray done was normal.  May need  CT or MRI. ?Skin ulcers over the paraumbilical pannus of the abdomen- culture sent ? ?Bronchopneumonia left lingula-flu/covid negative- treated with 5 days of Iv ceftriaxone and azithromycin ? ?Weakness -  ? ?Endometrial carcinoma- undergoing radiation ?Hysterectomy planned later ? ?Uterine bleed causing anemia needing Blood transfusion and IV iron as OP ? ?Discussed the management with patient and hospitalist. ? ?  ?

## 2021-03-24 NOTE — Progress Notes (Signed)
Transesophageal Echocardiogram : ? ?Indication: bacteremia, r/o endocarditis ?Requesting/ordering  physician: Dr. Delaine Lame ? ?Procedure: Benzocaine spray x2 and 2 mls x 2 of viscous lidocaine were given orally to provide local anesthesia to the oropharynx. The patient was positioned supine on the left side, bite block provided. The patient was moderately sedated with the doses of versed and fentanyl as detailed below.  Using digital technique an omniplane probe was advanced into the distal esophagus without incident.  ? ?Moderate sedation: ?1. Sedation used:  per anesthesia ? ?See report in EPIC  for complete details: ?In brief,  ?No valve vegetation noted ?transgastric imaging revealed normal LV function with no RWMAs and no mural apical thrombus.  .  Estimated ejection fraction was 55%.  Right sided cardiac chambers were normal with no evidence of pulmonary hypertension. ? ?Imaging of the septum showed no ASD or VSD ?Bubble study was negative for shunt ?2D and color flow confirmed no PFO ? ?The LA was well visualized in orthogonal views.  There was no spontaneous contrast and no thrombus in the LA and LA appendage  ? ?The descending thoracic aorta had no  mural aortic debris with no evidence of aneurysmal dilation or dissection. Mild aortic atheroma noted ? ? ?Selena Rodgers ?03/24/2021 ?8:38 AM  ?

## 2021-03-24 NOTE — Progress Notes (Signed)
?   03/24/21 9147  ?Assess: MEWS Score  ?Temp 99.2 ?F (37.3 ?C)  ?BP (!) 139/43  ?Pulse Rate (!) 115  ?SpO2 95 %  ?Assess: MEWS Score  ?MEWS Temp 0  ?MEWS Systolic 0  ?MEWS Pulse 2  ?MEWS RR 0  ?MEWS LOC 0  ?MEWS Score 2  ?MEWS Score Color Yellow  ?Assess: if the MEWS score is Yellow or Red  ?Were vital signs taken at a resting state? Yes  ?Focused Assessment No change from prior assessment  ?Does the patient meet 2 or more of the SIRS criteria? Yes  ?Does the patient have a confirmed or suspected source of infection? Yes  ?Provider and Rapid Response Notified? No  ?MEWS guidelines implemented *See Row Information* Yes  ?Notify: Charge Nurse/RN  ?Name of Charge Nurse/RN Notified Orvil Feil, RN  ?Date Charge Nurse/RN Notified 03/24/21  ?Time Charge Nurse/RN Notified 0630  ?Document  ?Patient Outcome Other (Comment) ?(continuing to monitor)  ?Progress note created (see row info) Yes  ?Assess: SIRS CRITERIA  ?SIRS Temperature  0  ?SIRS Pulse 1  ?SIRS Respirations  0  ?SIRS WBC 0  ?SIRS Score Sum  1  ? ? ?

## 2021-03-24 NOTE — Progress Notes (Signed)
Physical Therapy Treatment ?Patient Details ?Name: Selena Rodgers ?MRN: [id number] ?DOB: [birth year] ?Today's Date: 03/24/2021 ? ? ?History of Present Illness Pt is a 71 y.o. female presenting to hospital 3/16 for evaluation of weakness (slipped forward out of recliner and unable to get back up).  Pt admitted with generalized weakness, CAP, anemia, SOB, hyponatremia, and endometrial CA (currently receiving treatment).  Concern for cellulitis in L abdominal pannus.  Pt with large pendulous hernia of LLQ of abdomen.  PMH includes endometrial CA (daily radiation), DM, htn, anxiety, chronic back pain, spinal stenosis, uterine CA, ventral and umbilical hernia repair, ACDF, CTR, and foot surgery.  Patient had TEE this morning 3/23 and R hip xray with was negative for fracture or dislocation. ? ?  ?PT Comments  ? ? Patient received in bed, she reports she is lethargic this date from prior activities/procedures. She is agreeable to participate with PT/OT. Patient required heavy +2 assist for supine to sit. Attempts to help, but is not able to provide much assist. She required some level of assistance for sitting edge of bed this session. All mobility limited by fatigue and pain. She will continue to benefit from skilled PT while here to improve strength and functional independence.  ?    ?Recommendations for follow up therapy are one component of a multi-disciplinary discharge planning process, led by the attending physician.  Recommendations may be updated based on patient status, additional functional criteria and insurance authorization. ? ?Follow Up Recommendations ? Skilled nursing-short term rehab (<3 hours/day) ?  ?  ?Assistance Recommended at Discharge Frequent or constant Supervision/Assistance  ?Patient can return home with the following Two people to help with walking and/or transfers;Two people to help with bathing/dressing/bathroom;Assistance with cooking/housework;Assist for transportation;Help with stairs or  ramp for entrance ?  ?Equipment Recommendations ? None recommended by PT;Other (comment) (TBD)  ?  ?Recommendations for Other Services   ? ? ?  ?Precautions / Restrictions Precautions ?Precautions: Fall ?Precaution Comments: VERY Large pendulous hernia of LLQ of abdomen ?Restrictions ?Weight Bearing Restrictions: No ?Other Position/Activity Restrictions: Pt wears specialized shoes whenever out of bed  ?  ? ?Mobility ? Bed Mobility ?Overal bed mobility: Needs Assistance ?Bed Mobility: Supine to Sit, Rolling, Sit to Supine ?Rolling: Total assist, +2 for physical assistance ?  ?Supine to sit: Total assist, +2 for physical assistance, HOB elevated ?Sit to supine: Total assist, +2 for physical assistance ?  ?General bed mobility comments: Heavy +2 for bed mobility this session ?  ? ?Transfers ?  ?  ?  ?  ?  ?  ?  ?  ?  ?General transfer comment: unable to attempt today due to fatigue from procedures this am. ?  ? ?Ambulation/Gait ?  ?  ?  ?  ?  ?  ?  ?General Gait Details: unable ? ? ?Stairs ?  ?  ?  ?  ?  ? ? ?Wheelchair Mobility ?  ? ?Modified Rankin (Stroke Patients Only) ?  ? ? ?  ?Balance Overall balance assessment: Needs assistance ?Sitting-balance support: Feet supported ?Sitting balance-Leahy Scale: Poor ?Sitting balance - Comments: Required assistance throughout sitting this session. Max initially, then minA ?Postural control: Posterior lean ?  ?  ?  ?  ?  ?  ?  ?  ?  ?  ?  ?  ?  ?  ?  ? ?  ?Cognition Arousal/Alertness: Awake/alert ?Behavior During Therapy: [facility] for tasks assessed/performed ?Overall Cognitive Status: Within Functional Limits for tasks assessed ?  ?  ?  ?  ?  ?  ?  ?  ?  ?  ?  ?  ?  ?  ?  ?  ?  ?  ?  ? ?  ?  Exercises   ? ?  ?General Comments   ?  ?  ? ?Pertinent Vitals/Pain Pain Assessment ?Pain Assessment: Faces ?Faces Pain Scale: Hurts even more ?Pain Location: B LEs, R> L ?Pain Descriptors / Indicators: Grimacing, Guarding, Moaning, Sore ?Pain Intervention(s): Monitored during session,  Repositioned  ? ? ?Home Living   ?  ?  ?  ?  ?  ?  ?  ?  ?  ?   ?  ?Prior Function    ?  ?  ?   ? ?PT Goals (current goals can now be found in the care plan section) Acute Rehab PT Goals ?Patient Stated Goal: to improve strength and mobility ?PT Goal Formulation: With patient ?Time For Goal Achievement: 04/01/21 ?Potential to Achieve Goals: Fair ?Progress towards PT goals: Not progressing toward goals - comment (fatigued this session) ? ?  ?Frequency ? ? ? Min 2X/week ? ? ? ?  ?PT Plan Current plan remains appropriate  ? ? ?Co-evaluation PT/OT/SLP Co-Evaluation/Treatment: Yes ?Reason for Co-Treatment: For patient/therapist safety;To address functional/ADL transfers ?PT goals addressed during session: Mobility/safety with mobility;Balance ?  ?  ? ?  ?AM-PAC PT "6 Clicks" Mobility   ?Outcome Measure ? Help needed turning from your back to your side while in a flat bed without using bedrails?: Total ?Help needed moving from lying on your back to sitting on the side of a flat bed without using bedrails?: Total ?Help needed moving to and from a bed to a chair (including a wheelchair)?: Total ?Help needed standing up from a chair using your arms (e.g., wheelchair or bedside chair)?: Total ?Help needed to walk in hospital room?: Total ?Help needed climbing 3-5 steps with a railing? : Total ?6 Click Score: 6 ? ?  ?End of Session   ?Activity Tolerance: Patient limited by fatigue;Patient limited by pain ?Patient left: in bed;with call bell/phone within reach ?Nurse Communication: Mobility status ?PT Visit Diagnosis: Other abnormalities of gait and mobility (R26.89);Muscle weakness (generalized) (M62.81);History of falling (Z91.81);Pain ?Pain - Right/Left:  (B) ?Pain - part of body: Leg ?  ? ? ?Time: 3845-3646 ?PT Time Calculation (min) (ACUTE ONLY): 28 min ? ?Charges:  $Therapeutic Activity: 8-22 mins          ?          ? ?Amanda Cockayne, PT, GCS ?03/24/21,3:12 PM ? ? ?

## 2021-03-24 NOTE — Care Management Important Message (Signed)
Important Message ? ?Patient Details  ?Name: Selena Rodgers ?MRN: [id number] ?Date of Birth: [birth year] ? ? ?Medicare Important Message Given:  Yes ? ?I reviewed the Important Message from Medicare with her Selena Rodgers, Selena Rodgers by phone [phone]). I have sent her a copy via secure e-mail at [email or web address] ,com as directed. I answered her questions about the appeal process and she thanked for helping her understand it. I thanked her for time and provided my phone number if she had further questions that I could assist her with.  ? ? ?Juliann Pulse A Chisom Aust ?03/24/2021, 2:33 PM ?

## 2021-03-24 NOTE — Transfer of Care (Signed)
Immediate Anesthesia Transfer of Care Note ? ?Patient: Selena Rodgers ? ?Procedure(s) Performed: TRANSESOPHAGEAL ECHOCARDIOGRAM (TEE) ? ?Patient Location: PACU and Cath Lab ? ?Anesthesia Type:General ? ?Level of Consciousness: drowsy ? ?Airway & Oxygen Therapy: Patient Spontanous Breathing and Patient connected to nasal cannula oxygen ? ?Post-op Assessment: Report given to RN ? ?Post vital signs: stable ? ?Last Vitals:  ?Vitals Value Taken Time  ?BP 130/65 03/24/21 0835  ?Temp    ?Pulse 117 03/24/21 0838  ?Resp 26 03/24/21 0838  ?SpO2 97 % 03/24/21 0838  ? ? ?Last Pain:  ?Vitals:  ? 03/24/21 0720  ?TempSrc: Oral  ?PainSc: 8   ?   ? ?Patients Stated Pain Goal: 0 (03/23/21 1000) ? ?Complications: No notable events documented. ?

## 2021-03-25 ENCOUNTER — Ambulatory Visit: Payer: Medicare HMO

## 2021-03-25 ENCOUNTER — Telehealth: Payer: Self-pay | Admitting: *Deleted

## 2021-03-25 DIAGNOSIS — R7881 Bacteremia: Secondary | ICD-10-CM | POA: Diagnosis not present

## 2021-03-25 LAB — CULTURE, BLOOD (ROUTINE X 2)
Culture: NO GROWTH
Culture: NO GROWTH
Special Requests: ADEQUATE
Special Requests: ADEQUATE

## 2021-03-25 LAB — GLUCOSE, CAPILLARY
Glucose-Capillary: 144 mg/dL — ABNORMAL HIGH (ref 70–99)
Glucose-Capillary: 165 mg/dL — ABNORMAL HIGH (ref 70–99)
Glucose-Capillary: 162 mg/dL — ABNORMAL HIGH (ref 70–99)
Glucose-Capillary: 154 mg/dL — ABNORMAL HIGH (ref 70–99)

## 2021-03-25 LAB — CK: Total CK: 42 U/L (ref 38–234)

## 2021-03-25 MED ORDER — ALBUTEROL SULFATE (2.5 MG/3ML) 0.083% IN NEBU
2.5000 mg | INHALATION_SOLUTION | Freq: Four times a day (QID) | RESPIRATORY_TRACT | Status: DC
  Administered 2021-03-25 – 2021-03-27 (×8): 2.5 mg via RESPIRATORY_TRACT
  Filled 2021-03-25 (×8): qty 3

## 2021-03-25 MED ORDER — DICLOFENAC SODIUM 1 % EX GEL
4.0000 g | Freq: Four times a day (QID) | CUTANEOUS | Status: DC
  Administered 2021-03-25 – 2021-03-29 (×11): 4 g via TOPICAL
  Filled 2021-03-25: qty 100

## 2021-03-25 MED ORDER — SODIUM CHLORIDE 0.9 % IV SOLN
8.0000 mg/kg | Freq: Every day | INTRAVENOUS | Status: DC
  Administered 2021-03-25 – 2021-03-28 (×4): 700 mg via INTRAVENOUS
  Filled 2021-03-25 (×5): qty 14

## 2021-03-25 MED ORDER — FUROSEMIDE 10 MG/ML IJ SOLN
40.0000 mg | Freq: Once | INTRAMUSCULAR | Status: AC
Start: 2021-03-25 — End: 2021-03-25
  Administered 2021-03-25: 40 mg via INTRAVENOUS
  Filled 2021-03-25: qty 4

## 2021-03-25 NOTE — TOC Progression Note (Addendum)
Transition of Care (TOC) - Progression Note  ? ? ?Patient Details  ?Name: Selena Rodgers ?MRN: [id number] ?Date of Birth: 1950-02-23 ? ?Transition of Care (TOC) CM/SW Contact  ?Oiva Dibari A Ervin Hensley, LCSW ?Phone Number: ?03/25/2021, 8:49 AM ? ?Clinical Narrative:   CSW received a message from pt's radiation nurse and pt still has radiation treatments to finish outpatient once dc. Pt is on treatment 4 out of 10 this Monday the 27th. This will probably be a barrier for SNF. CSW has reached out to [facility] to see if they can transport for the remaining treatments. MD notified.  ? ? ?CSW spoke with [facility] and they can not take pt's receiving chemo or radiation. They rescinded their offer. ?  ? Pt's sister talked to pt and they have decided for pt to pause treatments until after SNF due to weakness. CSW has notified [facility] and they are agreeable to take pt if treatment is paused. Radiation RN notified. However, insurance auth still pending and pt is not medically stable per MD. ? ?Expected Discharge Plan and Services ?  ?  ?  ?  ?  ?                ?  ?  ?  ?  ?  ?  ?  ?  ?  ?  ? ? ?Social Determinants of Health (SDOH) Interventions ?  ? ?Readmission Risk Interventions ?   ? View : No data to display.  ?  ?  ?  ? ? ?

## 2021-03-25 NOTE — Progress Notes (Addendum)
Physical Therapy Treatment ?Patient Details ?Name: Selena Rodgers ?MRN: [id number] ?DOB: 02-Sep-1950 ?Today's Date: 03/25/2021 ? ? ?History of Present Illness Pt is a 71 y.o. female presenting to hospital 3/16 for evaluation of weakness (slipped forward out of recliner and unable to get back up).  Pt admitted with generalized weakness, CAP, anemia, SOB, hyponatremia, and endometrial CA (currently receiving treatment).  Concern for cellulitis in L abdominal pannus.  Pt with large pendulous hernia of LLQ of abdomen.  PMH includes endometrial CA (daily radiation), DM, htn, anxiety, chronic back pain, spinal stenosis, uterine CA, ventral and umbilical hernia repair, ACDF, CTR, and foot surgery.  Patient had TEE this morning 3/23 and R hip xray with was negative for fracture or dislocation ? ?  ?PT Comments  ? ? Pt seen for PT tx with co-tx with OT for pt & therapists safety. Pt motivated to participate but also tearful at times during session. Pt requires max assist +2 for supine<>sit but per OT, pt required less assistance compared to yesterday. Pt tolerates sitting EOB ~15 minutes with CGA<>min assist & performs BLE strengthening exercises with decreased ROM. Pt is eager to get OOB to her personal w/c after family brings it in this weekend. Also spoke with nurse about transitioning pt back to regular bed so she can sit in chair position, as she's unable to do so in bariatric bed. Will continue to follow pt acutely to progress activity tolerance, strength, & transfers as able.  ? ?Addendum: Reviewed use of incentive spirometer & assisted pt with using device during session. ?   ?Recommendations for follow up therapy are one component of a multi-disciplinary discharge planning process, led by the attending physician.  Recommendations may be updated based on patient status, additional functional criteria and insurance authorization. ? ?Follow Up Recommendations ? Skilled nursing-short term rehab (<3 hours/day) ?  ?   ?Assistance Recommended at Discharge Frequent or constant Supervision/Assistance  ?Patient can return home with the following Two people to help with walking and/or transfers;Two people to help with bathing/dressing/bathroom;Assistance with cooking/housework;Assist for transportation;Help with stairs or ramp for entrance ?  ?Equipment Recommendations ?  (TBD in next venue)  ?  ?Recommendations for Other Services   ? ? ?  ?Precautions / Restrictions Precautions ?Precautions: Fall ?Precaution Comments: VERY Large pendulous hernia of LLQ of abdomen ?Restrictions ?Weight Bearing Restrictions: No ?Other Position/Activity Restrictions: Pt wears specialized shoes whenever out of bed (they appear to have built up lateral aspec of B shoes)  ?  ? ?Mobility ? Bed Mobility ?Overal bed mobility: Needs Assistance ?Bed Mobility: Supine to Sit, Sit to Supine ?  ?  ?Supine to sit: Max assist, +2 for physical assistance, HOB elevated (pt is able to initiate moving BLE feet towards EOB) ?Sit to supine: Max assist, +2 for physical assistance, HOB elevated ?  ?General bed mobility comments: per OT, pt with improvment in bed mobility compared to previous session as pt requires less assistance, pt attempts to assist with use of bed rails ?  ? ?Transfers ?  ?  ?  ?  ?  ?  ?  ?  ?  ?  ?  ? ?Ambulation/Gait ?  ?  ?  ?  ?  ?  ?  ?  ? ? ?Stairs ?  ?  ?  ?  ?  ? ? ?Wheelchair Mobility ?  ? ?Modified Rankin (Stroke Patients Only) ?  ? ? ?  ?Balance Overall balance assessment: Needs assistance ?Sitting-balance support: Feet supported,  Bilateral upper extremity supported ?Sitting balance-Leahy Scale: Poor ?Sitting balance - Comments: CGA<>min assist sitting EOB ?  ?  ?  ?  ?  ?  ?  ?  ?  ?  ?  ?  ?  ?  ?  ?  ? ?  ?Cognition Arousal/Alertness: Awake/alert ?Behavior During Therapy: [facility] for tasks assessed/performed ?Overall Cognitive Status: Within Functional Limits for tasks assessed ?  ?  ?  ?  ?  ?  ?  ?  ?  ?  ?  ?  ?  ?  ?  ?  ?General  Comments: Motivated to participate & eager to get OOB to her personal w/c when her family brings it in but also tearful at times with PT/OT providing encouragement throughout. ?  ?  ? ?  ?Exercises General Exercises - Lower Extremity ?Long Arc Quad: AROM, Strengthening, Both, 10 reps, Seated (decreased ROM LLE compared to RLE) ? ?  ?General Comments   ?  ?  ? ?Pertinent Vitals/Pain Pain Assessment ?Pain Assessment: Faces ?Faces Pain Scale: Hurts even more ?Pain Location: L hip, knee ?Pain Descriptors / Indicators: Grimacing, Guarding, Sore ?Pain Intervention(s): Monitored during session, Repositioned  ? ? ?Home Living   ?  ?  ?  ?  ?  ?  ?  ?  ?  ?   ?  ?Prior Function    ?  ?  ?   ? ?PT Goals (current goals can now be found in the care plan section) Acute Rehab PT Goals ?Patient Stated Goal: to improve strength and mobility ?PT Goal Formulation: With patient ?Time For Goal Achievement: 04/01/21 ?Potential to Achieve Goals: Fair ?Progress towards PT goals: Progressing toward goals ? ?  ?Frequency ? ? ? Min 2X/week ? ? ? ?  ?PT Plan Current plan remains appropriate  ? ? ?Co-evaluation PT/OT/SLP Co-Evaluation/Treatment: Yes ?Reason for Co-Treatment: Complexity of the patient's impairments (multi-system involvement);For patient/therapist safety ?PT goals addressed during session: Mobility/safety with mobility;Balance;Strengthening/ROM ?OT goals addressed during session: ADL's and self-care;Strengthening/ROM ?  ? ?  ?AM-PAC PT "6 Clicks" Mobility   ?Outcome Measure ? Help needed turning from your back to your side while in a flat bed without using bedrails?: Total ?Help needed moving from lying on your back to sitting on the side of a flat bed without using bedrails?: Total ?Help needed moving to and from a bed to a chair (including a wheelchair)?: Total ?Help needed standing up from a chair using your arms (e.g., wheelchair or bedside chair)?: Total ?Help needed to walk in hospital room?: Total ?Help needed climbing  3-5 steps with a railing? : Total ?6 Click Score: 6 ? ?  ?End of Session   ?Activity Tolerance: Patient tolerated treatment well;Patient limited by fatigue ?Patient left: in bed;with call bell/phone within reach;with nursing/sitter in room ?Nurse Communication: Mobility status ?PT Visit Diagnosis: Other abnormalities of gait and mobility (R26.89);Muscle weakness (generalized) (M62.81);Pain ?Pain - Right/Left: Left ?Pain - part of body: Knee;Hip ?  ? ? ?Time: 1027-2536 ?PT Time Calculation (min) (ACUTE ONLY): 25 min ? ?Charges:  $Therapeutic Activity: 8-22 mins          ?          ? ?Lavone Nian, PT, DPT ?03/25/21, 2:54 PM ? ? ? ?Waunita Schooner ?03/25/2021, 2:35 PM ? ?

## 2021-03-25 NOTE — Treatment Plan (Signed)
Diagnosis: ?Staph epidermidis bacteremia with abdominal wall wounds ?Baseline Creatinine =0.72 ?CPK 42 ? ? ?Allergies  ?Allergen Reactions  ? Chlorpheniramine Anaphylaxis  ? Flaxseed (Linseed) Anaphylaxis  ? Codeine Hives  ? Flax Seed Oil [Bio-Flax] Swelling  ?  UNSPECIFIED   ? Latex Hives and Other (See Comments)  ?  BLISTERS  ? Tape Other (See Comments)  ?  BLISTERS  ? Tapentadol Other (See Comments)  ?  BLISTERS  ? Rosemary Oil Swelling  ?  Lip swelling  ? 2,4-D Dimethylamine Rash  ?  T-DAP  ? Aspirin Rash  ? ? ?OPAT Orders ?Discharge antibiotics: ?Daptomycin '700mg'$  IVPB every 24 hours  ? ?End Date: ?04/01/21 ? ?[facility] Care Per Protocol: ? ?Labs weekly while on IV antibiotics: ?_X_ CBC with differential ?_ ?_X_ CMP ?X CPK ? ?_X_ Please pull PIC at completion of IV antibiotics ? ?Fax weekly labs to [phone] ? ?Clinic Follow Up Appt: 04/05/21 at 11.45am ? ? ?Call [phone] with any questions ? ?  ?

## 2021-03-25 NOTE — Progress Notes (Signed)
Occupational Therapy Treatment ?Patient Details ?Name: Selena Rodgers ?MRN: [id number] ?DOB: Jan 25, 1950 ?Today's Date: 03/25/2021 ? ? ?History of present illness Pt is a 71 y.o. female presenting to hospital 3/16 for evaluation of weakness (slipped forward out of recliner and unable to get back up).  Pt admitted with generalized weakness, CAP, anemia, SOB, hyponatremia, and endometrial CA (currently receiving treatment).  Concern for cellulitis in L abdominal pannus.  Pt with large pendulous hernia of LLQ of abdomen.  PMH includes endometrial CA (daily radiation), DM, htn, anxiety, chronic back pain, spinal stenosis, uterine CA, ventral and umbilical hernia repair, ACDF, CTR, and foot surgery.  Patient had TEE this morning 3/23 and R hip xray with was negative for fracture or dislocation ?  ?OT comments ? Chart reviewed to date, co tx completed on this date due to assist required for mobility, for pt/therapist safety. Tx session targeted improving functional mobility/indep during ADL tasks to facilitate return to PLOF. Improved tolerance and performance throughout on this date. Pt continued to require MAX A +2 for supine<>sit, however tolerated seated on EOB for approx 15 minutes requiring CGA-MIN A throughout. Improved tolerance and performances with seated dynamic sitting ADL tasks. Pt is left as received, in care of RN, NAD. OT will continue to follow acutely. ? ?Discussed with RN switching hospital bed out for bed that would allow for chair mode if possible while pt is admitted.   ? ?Recommendations for follow up therapy are one component of a multi-disciplinary discharge planning process, led by the attending physician.  Recommendations may be updated based on patient status, additional functional criteria and insurance authorization. ?   ?Follow Up Recommendations ? Skilled nursing-short term rehab (<3 hours/day)  ?  ?Assistance Recommended at Discharge Frequent or constant Supervision/Assistance  ?Patient can  return home with the following ? Two people to help with walking and/or transfers;A lot of help with bathing/dressing/bathroom;Help with stairs or ramp for entrance;Assist for transportation ?  ?Equipment Recommendations ? Other (comment) (per next venue of care)  ?  ?Recommendations for Other Services   ? ?  ?Precautions / Restrictions Precautions ?Precautions: Fall ?Precaution Comments: VERY Large pendulous hernia of LLQ of abdomen ?Restrictions ?Other Position/Activity Restrictions: Pt wears specialized shoes whenever out of bed  ? ? ?  ? ?Mobility Bed Mobility ?Overal bed mobility: Needs Assistance ?Bed Mobility: Supine to Sit, Sit to Supine ?  ?  ?Supine to sit: Max assist, +2 for physical assistance ?Sit to supine: Max assist, +2 for physical assistance ?  ?General bed mobility comments: improved from previous tx session; ?  ? ?Transfers ?  ?  ?  ?  ?  ?  ?  ?  ?  ?  ?  ?  ?Balance Overall balance assessment: Needs assistance ?Sitting-balance support: Feet supported ?Sitting balance-Leahy Scale: Poor ?Sitting balance - Comments: CGA- MIN A ?  ?  ?  ?  ?  ?  ?  ?  ?  ?  ?  ?  ?  ?  ?  ?   ? ?ADL either performed or assessed with clinical judgement  ? ?ADL Overall ADL's : Needs assistance/impaired ?  ?  ?  ?  ?  ?  ?  ?  ?  ?  ?  ?  ?  ?  ?  ?  ?  ?  ?  ?General ADL Comments: TOTAL A for donning shoes, UB grooming tasks with MIN A at edge of bed- assist required to remove  tangles from hair; pt tolerates seated at EOB for approx 15 minutes requiring CGA-MIN A for static and dynamic sitting balance tasks ?  ? ?Extremity/Trunk Assessment   ?  ?  ?  ?  ?  ? ?Vision   ?  ?  ?Perception   ?  ?Praxis   ?  ? ?Cognition Arousal/Alertness: Awake/alert ?Behavior During Therapy: [facility] for tasks assessed/performed ?Overall Cognitive Status: Within Functional Limits for tasks assessed ?  ?  ?  ?  ?  ?  ?  ?  ?  ?  ?  ?  ?  ?  ?  ?  ?  ?  ?  ?   ?Exercises   ? ?  ?Shoulder Instructions   ? ? ?  ?General Comments     ? ? ?Pertinent Vitals/ Pain       Pain Assessment ?Pain Assessment: Faces ?Faces Pain Scale: Hurts even more ?Pain Location: L hip, knee ?Pain Descriptors / Indicators: Grimacing, Guarding, Moaning, Sore ?Pain Intervention(s): Monitored during session, Repositioned ? ?Home Living   ?  ?  ?  ?  ?  ?  ?  ?  ?  ?  ?  ?  ?  ?  ?  ?  ?  ?  ? ?  ?Prior Functioning/Environment    ?  ?  ?  ?   ? ?Frequency ? Min 3X/week  ? ? ? ? ?  ?Progress Toward Goals ? ?OT Goals(current goals can now be found in the care plan section) ? Progress towards OT goals: Progressing toward goals ? ?Acute Rehab OT Goals ?Patient Stated Goal: get stronger ?OT Goal Formulation: With patient ?Time For Goal Achievement: 04/08/21 ?Potential to Achieve Goals: Good  ?Plan Discharge plan remains appropriate;Frequency remains appropriate   ? ?Co-evaluation ? ? ?   ?  ?  ?OT goals addressed during session: ADL's and self-care;Strengthening/ROM ?  ? ?  ?AM-PAC OT "6 Clicks" Daily Activity     ?Outcome Measure ? ? Help from another person eating meals?: None ?Help from another person taking care of personal grooming?: A Little ?Help from another person toileting, which includes using toliet, bedpan, or urinal?: Total ?Help from another person bathing (including washing, rinsing, drying)?: Total ?Help from another person to put on and taking off regular upper body clothing?: A Lot ?Help from another person to put on and taking off regular lower body clothing?: Total ?6 Click Score: 12 ? ?  ?End of Session   ? ?OT Visit Diagnosis: Unsteadiness on feet (R26.81);History of falling (Z91.81);Repeated falls (R29.6);Muscle weakness (generalized) (M62.81) ?  ?Activity Tolerance Patient limited by pain ?  ?Patient Left in bed;with call bell/phone within reach;with nursing/sitter in room ?  ?Nurse Communication Mobility status ?  ? ?   ? ?Time: 5701-7793 ?OT Time Calculation (min): 24 min ? ?Charges: OT General Charges ?$OT Visit: 1 Visit ?OT Treatments ?$Self  Care/Home Management : 8-22 mins ? ?Shanon Payor, OTD OTR/L  ?03/25/21, 11:11 AM  ?

## 2021-03-25 NOTE — Progress Notes (Signed)
Pharmacy Antibiotic Note ? ?Selena Rodgers is a 71 y.o. female w/ PMH of arthritis and endometrial cancer (currently on Radiation therapy), T2DM, & HTNadmitted on 03/16/2021 with extreme fatigue >> c/f CAP + cellulitis of abdominal wall and newly discovered MRSE bacteremia.  Patient was on vancomycin, now Pharmacy has been consulted for daptomycin dosing. ? ?Today, 03/25/2021 ?Day #7  vancomycin ?CK = 42  ?Renal: SCr stable ?WBC stable ?Afebrile ?Abdominal wall culture: NGTD ?TEE was negative for endocarditis 3/23 ?Repeat blood cx from 3/19 remain NGTD ?Vancomycin levels on vancomycin 1gm IV q12h (dose 3/20 at 2203) ?Vanco peak 3/21 0030 = 28 ?Vanco trough 3/21 0853 = 10 ?Calculated AUC  =446.4 (goal 400-600) ?Cmax = 33.5, Cmin 8.7 ?Plan to change vancomycin to daptomycin 3/24 ? ?Plan:  ?Daptomycin '700mg'$  ('8mg'$ /kg per adjusted BW) q24h ?Monitor weekly CK ?Monitor renal function ?Plan discharge to SNF/Rehab - [facility] has said they will take her on Daptomycin ?Daptomycin end date 04/01/2021 per ID ? ?Height: '5\' 7"'$  (170.2 cm) ?Weight: 129.3 kg (285 lb) ?IBW/kg (Calculated) : 61.6 ? ?Temp (24hrs), Avg:98.2 ?F (36.8 ?C), Min:97.8 ?F (36.6 ?C), Max:98.9 ?F (37.2 ?C) ? ?Recent Labs  ?Lab 03/19/21 ?0605 03/20/21 ?0034 03/21/21 ?0423 03/22/21 ?0030 03/22/21 ?0300 03/22/21 ?0853 03/23/21 ?9179 03/24/21 ?0259  ?WBC 3.4* 5.2 6.6  --  8.4  --  11.5*  --   ?CREATININE 0.64 0.51 0.52  --  0.59  --  0.76 0.72  ?[place]  --   --   --   --   --  10*  --   --   ?VANCOPEAK  --   --   --  28*  --   --   --   --   ? ?  ?Estimated Creatinine Clearance: 94.2 mL/min (by C-G formula based on SCr of 0.72 mg/dL).   ? ?Allergies  ?Allergen Reactions  ? Chlorpheniramine Anaphylaxis  ? Flaxseed (Linseed) Anaphylaxis  ? Codeine Hives  ? Flax Seed Oil [Bio-Flax] Swelling  ?  UNSPECIFIED   ? Latex Hives and Other (See Comments)  ?  BLISTERS  ? Tape Other (See Comments)  ?  BLISTERS  ? Tapentadol Other (See Comments)  ?  BLISTERS  ? Rosemary Oil  Swelling  ?  Lip swelling  ? 2,4-D Dimethylamine Rash  ?  T-DAP  ? Aspirin Rash  ? ? ?Antimicrobials this admission: ?03/16 ceftriaxone >> 3/20 ?03/16 azithromycin >> 3/20 ?03/18 vancomycin >> 3/23 ?3/24 daptomycin >> ? ?Microbiology results: ?03/15 BCx '@2330'$ : 2/2 MRSE ?03/16 Bcx @ 0630: 1/2 S epidermidis ?3/16 Bcx '@2220'$ : 3/4 S epidermidis ?3/19 Bcx NGTD ?3/22 abdominal wall cx: NG ?03/17 MRSA PCR: positive ? ?Thank you for allowing pharmacy to be a part of this patient?s care. ? ?Doreene Eland, PharmD, BCPS, BCIDP ?Work Cell: [phone] ?03/25/2021 7:58 AM ? ? ? ?

## 2021-03-25 NOTE — TOC Progression Note (Signed)
Transition of Care (TOC) - Progression Note  ? ? ?Patient Details  ?Name: Selena Rodgers ?MRN: [id number] ?Date of Birth: [birth year] ? ?Transition of Care (TOC) CM/SW Contact  ?Marajade Lei A Ilka Lovick, LCSW ?Phone Number: ?03/25/2021, 11:38 AM ? ?Clinical Narrative:   Pt got insurance auth, however not medically stable. MD aware. ? ?Certification # [id number].  Approved 3/24 - 3/26, next review 3/26.  Approved for SNF Level 1.  Reviewer will be Tanna Savoy, Therapist, sports.  Her number is: [phone].  If using ANNA, they can fax updates to [phone].  If not on ANNA, can fax updates to [phone].   ? ? ? ?  ?  ? ?Expected Discharge Plan and Services ?  ?  ?  ?  ?  ?                ?  ?  ?  ?  ?  ?  ?  ?  ?  ?  ? ? ?Social Determinants of Health (SDOH) Interventions ?  ? ?Readmission Risk Interventions ?   ? View : No data to display.  ?  ?  ?  ? ? ?

## 2021-03-25 NOTE — Progress Notes (Addendum)
PHARMACY CONSULT NOTE FOR: ? ?OUTPATIENT  PARENTERAL ANTIBIOTIC THERAPY (OPAT) ? ?Indication: MRSE bacteremia and abdominal wall wounds ?Regimen: Daptomycin '700mg'$  IV q24h ?End date: 04/01/2021 ? ?IV antibiotic discharge orders are pended. ?To discharging provider:  please sign these orders via discharge navigator,  ?Select New Orders & click on the button choice - Manage This Unsigned Work.  ?  ? ?Thank you for allowing pharmacy to be a part of this patient's care. ? ?Doreene Eland, PharmD, BCPS, BCIDP ?Work Cell: [phone] ?03/25/2021 2:48 PM ? ? ? ?

## 2021-03-25 NOTE — Telephone Encounter (Signed)
Selena Rodgers is discharging from the hospital to [facility].  Per the CSW, [facility] will not accept patients who are undergoing chemo and radiation.   Discussed with Dr. Baruch Gouty who said it would be ok to hold treatments and potentially start her back on radiation after she is discharged home.   I spoke with [name] and gave her the direct number to call and let us know when she is going to be discharged from [facility] so we can get her back on the schedule.    ?

## 2021-03-25 NOTE — Progress Notes (Signed)
?PROGRESS NOTE ? ? ? ?Selena Rodgers  QQV:[id number] DOB: [birth year] DOA: 03/16/2021 ?PCP: [name], MD  ? ? ?Brief Narrative:  ?71 y.o. female with medical history significant of arthritis and endometrial cancer, dm II, HTN.  Patient is receiving radiation treatment at the cancer center.  She felt extreme fatigued at home.  Could not go for radiation treatment on the day of admission.  She also mentioned cough shortness of breath.  She was found to have hyponatremia.  CT scan raised concern for pneumonia.  She was hospitalized for further management.  There was also some concern for cellulitis in her left abdominal pannus.  Started on antibiotics.  Found to have Staph epidermidis bacteremia.  Initially thought to be contaminant but repeat blood cultures also showed similar organism.  ID was consulted.  On vancomycin. ? ? ?Assessment & Plan: ?  ?Principal Problem: ?  CAP (community acquired pneumonia) ?Active Problems: ?  Cellulitis of abdominal wall ?  Bacteremia ?  Generalized weakness ?  Endometrial cancer ([place]) ?  Hyponatremia ?  Anemia ?  Diabetes ([place]) ?  Back pain ?  GERD (gastroesophageal reflux disease) ?  Current tobacco use ?  Cellulitis ? ? ?CAP (community acquired pneumonia) ?Patient presented with generalized weakness cough shortness of breath.  Chest x-ray did not show any infiltrate but CT scan did raise concern for pneumonia.  ?Required oxygen briefly. ?Remains afebrile.  WBC is normal. ?Ceftriaxone and azithromycin for 5 days.   ?Respiratory status has improved.  Occasional cough.   ?Saturating normal on room air.  No further CAP coverage ?  ?Bacteremia ?Staphylococcus epidermidis bacteremia ?BC ID did turn positive for Staph epidermidis.  Repeat set also reveals Staph epidermidis.  Patient started on vancomycin.   ?TEE negative on 3/23 ?ID following ?PICC line in place ?Plan: ?Stop IV vancomycin ?IV daptomycin per ID recommendations ?Will need 2-week course ?PICC line in  place ? ?Cellulitis of abdominal wall ?Patient has maceration of the skin over the left abdomen/pannus.  Erythema was noted suggesting cellulitis.  No clear abscess. ?Seen by wound care.  Wound care dressing changes to be performed. ?Plan: ?Daptomycin as above ?Local wound care ?  ?Endometrial cancer ([place]) ?Has been receiving radiation treatment.  Supposed to end March 29.  ?Oncology was notified of admission.  Radiation treatments on hold.  ?Patient mentioned that she passed 2 small clots vaginally.  Hemoglobin stable. ?Noted to be on megestrol.  This was started on admission.  Considering its prothrombotic tendency will discontinue ?  ?Generalized weakness ?Hypokalemia ?  ?Likely due to dehydration, pneumonia, anemia.  Vital signs are stable.  Lactic acid level was normal.  CK was noted to be elevated at 819, improved to 709.  ?Needs a skilled nursing facility for rehabilitation per physical therapy. ?Continue to replete potassium. ?  ?Hyponatremia ?Previously normal.  Likely due to dehydration.  ?Sodium levels low but stable.   ?Urine osmolality is 970.  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  Urine sodium was 43.   ?Continue with fluid restriction and salt tablets.  Sodium level slightly better today.  Stable for the most part.  Continue to monitor. ?  ?Diabetes ([place]) ?Monitor CBGs.  Continue SSI. ?  ?  ?Anemia ?Anemia likely multifactorial.  Previously has had acute on chronic blood loss anemia from vaginal bleeding.  She has required blood transfusion. ?Now there is some element of anemia of chronic disease.  Hemoglobin low but stable.  Continue to monitor.  ?  ?  Back pain ?Patient with chronic history of back pain but has noticed worsening leg pain as well as leg weakness in the last few days.  Denies any falls in her back.  She is able to move her legs but has diminished strength.   ?She has had spinal fusion surgery previously.  [name] in [place] is her neurosurgeon. ?MRI of the lumbar spine was done on  3/18 which does show significant disease but no different from previous MRI which was done in 2021.  Patient and sister reassured.   ?-X-ray right hip negative for fracture or dislocation ?Plan: ?As needed pain control with Voltaren gel ?  ?  ?GERD (gastroesophageal reflux disease) ?Vomiting ?Patient continues to endorse reflux of food into the back of the throat.  Was started on IV PPI 3/21.  Repeat CT scan reassuring.  Abdominal exam reassuring ?Plan: ?P.o. PPI ?P.o. H2 blocker ?P.o. simethicone ?As needed Mylanta ?As needed antiemetics ? ?  ?Current tobacco use ?Nicotine patch. ?  ?  ?  ?Morbid obesity ?Estimated body mass index is 44.64 kg/m? as calculated from the following: ?  Height as of this encounter: '5\' 7"'$  (1.702 m). ?  Weight as of this encounter: 129.3 kg. ?This complicates overall care and prognosis ? ?DVT prophylaxis: SQ heparin ?Code Status: Full ?Family Communication: [name] [phone] on 3/22 ?Disposition Plan: Status is: Inpatient ?Remains inpatient appropriate because: I intractable pain.  Decreased ability to ambulate.  Anticipate medical readiness for discharge in 24 to 48 hours ?  ?Level of care: Med-Surg ? ?Consultants:  ?ID ? ?Procedures:  ?None ? ?Antimicrobials: ?IV vancomycin ? ? ?Subjective: ?Seen and examined.  Appears fatigued.  Reports weakness and pain with ambulation or exertion. ? ?Objective: ?Vitals:  ? 03/24/21 2056 03/25/21 0411 03/25/21 0414 03/25/21 0757  ?BP: (!) 149/61 138/63 138/63 (!) 158/76  ?Pulse: (!) 104 (!) 101 (!) 101 93  ?Resp: '18 18 18 20  '$ ?Temp: 97.8 ?F (36.6 ?C) 98.1 ?F (36.7 ?C) 98.1 ?F (36.7 ?C) 98 ?F (36.7 ?C)  ?TempSrc:  Oral Oral   ?SpO2: 93% 93%  96%  ?Weight:      ?Height:      ? ? ?Intake/Output Summary (Last 24 hours) at 03/25/2021 1208 ?Last data filed at 03/24/2021 1528 ?Gross per 24 hour  ?Intake 191.47 ml  ?Output --  ?Net 191.47 ml  ? ?Filed Weights  ? 03/16/21 2315  ?Weight: 129.3 kg  ? ? ?Examination: ? ?General exam: NAD ?Respiratory  system: Slightly tachypnea.  Scattered crackles bilaterally.  Normal work of breathing.  Room air ?Cardiovascular system: S1-S2, RRR, no murmurs, no pedal edema ?Gastrointestinal system: Obese, significant left periumbilical hernia, erythema over pannus ?Central nervous system: Alert and oriented. No focal neurological deficits. ?Extremities: Decreased range of motion bilateral lower extremities ?Skin: No rashes, lesions or ulcers ?Psychiatry: Judgement and insight appear normal. Mood & affect appropriate.  ? ? ? ?Data Reviewed: I have personally reviewed following labs and imaging studies ? ?CBC: ?Recent Labs  ?Lab 03/19/21 ?0605 03/20/21 ?6440 03/21/21 ?0423 03/22/21 ?0300 03/23/21 ?3474  ?WBC 3.4* 5.2 6.6 8.4 11.5*  ?NEUTROABS  --   --   --   --  9.6*  ?HGB 8.3* 8.6* 9.2* 9.4* 9.9*  ?HCT 26.1* 26.9* 28.3* 29.1* 31.2*  ?MCV 84.2 84.3 83.0 82.2 84.1  ?PLT 120* 142* 149* 245 297  ? ?Basic Metabolic Panel: ?Recent Labs  ?Lab 03/19/21 ?0605 03/20/21 ?2595 03/21/21 ?0423 03/22/21 ?0300 03/23/21 ?6387 03/24/21 ?0259  ?NA 129*  130* 129* 132* 131*  --   ?K 3.5 3.6 3.5 3.4* 3.8  --   ?CL 102 102 100 99 97*  --   ?CO2 19* 20* 21* 22 23  --   ?GLUCOSE 151* 140* 134* 137* 153*  --   ?BUN '14 11 12 15 19  '$ --   ?CREATININE 0.64 0.51 0.52 0.59 0.76 0.72  ?CALCIUM 8.4* 8.7* 8.7* 8.9 8.8*  --   ? ?GFR: ?Estimated Creatinine Clearance: 94.2 mL/min (by C-G formula based on SCr of 0.72 mg/dL). ?Liver Function Tests: ?No results for input(s): AST, ALT, ALKPHOS, BILITOT, PROT, ALBUMIN in the last 168 hours. ? ?No results for input(s): LIPASE, AMYLASE in the last 168 hours. ?No results for input(s): AMMONIA in the last 168 hours. ?Coagulation Profile: ?No results for input(s): INR, PROTIME in the last 168 hours. ? ?Cardiac Enzymes: ?Recent Labs  ?Lab 03/19/21 ?0605 03/25/21 ?0606  ?CKTOTAL 709* 42  ? ?BNP (last 3 results) ?No results for input(s): PROBNP in the last 8760 hours. ?HbA1C: ?No results for input(s): HGBA1C in the last 72  hours. ?CBG: ?Recent Labs  ?Lab 03/24/21 ?1004 03/24/21 ?1300 03/24/21 ?1841 03/24/21 ?2118 03/25/21 ?3662  ?GLUCAP 165* 163* 152* 168* 144*  ? ?Lipid Profile: ?No results for input(s): CHOL, HDL, LDLCALC, TRIG, CHOLHDL, LDL

## 2021-03-25 NOTE — Progress Notes (Signed)
? ?Date of Admission:  03/16/2021    ? ?ID: Selena Rodgers is a 71 y.o. 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 ? ?Presented to [facility] ED on 03/16/21 with weakness and fall  ?In the ED temp 99.6, BP 135/66, HR 109 ?HB 9.7, WBC 8.4, Na 127, cr 0.77 ?BC- 03/16/21- MRSE ?3/16 BC- MRSE ?3/19 BC- NG ?Jan 9.2016 abdominal wound- MSSA ? ? ? ?Subjective: ?C/o sob ?Says she sat on the edge of bed and worked with PT ? ?Medications:  ? albuterol  2.5 mg Nebulization QID  ? Chlorhexidine Gluconate Cloth  6 each Topical Daily  ? citalopram  20 mg Oral Daily  ? diclofenac Sodium  4 g Topical QID  ? famotidine  20 mg Oral BID  ? gabapentin  300 mg Oral BID  ? heparin  5,000 Units Subcutaneous Q8H  ? insulin aspart  0-9 Units Subcutaneous TID WC  ? insulin aspart  2 Units Subcutaneous TID WC  ? mirtazapine  15 mg Oral QHS  ? pantoprazole  40 mg Oral Daily  ? senna-docusate  2 tablet Oral QHS  ? simethicone  80 mg Oral QID  ? sodium chloride flush  10-40 mL Intracatheter Q12H  ? sodium chloride  1 g Oral BID WC  ? ? ?Objective: ?Vital signs in last 24 hours: ?Temp:  [97.8 ?F (36.6 ?C)-98.1 ?F (36.7 ?C)] 98 ?F (36.7 ?C) (03/24 0757) ?Pulse Rate:  [93-104] 93 (03/24 0757) ?Resp:  [18-20] 20 (03/24 0757) ?BP: (104-158)/(54-76) 158/76 (03/24 0757) ?SpO2:  [93 %-98 %] 98 % (03/24 1238) ? ?PHYSICAL EXAM:  ?General: ill looking, awake and alert ?Head:  Normocephalic, without obvious abnormality, atraumatic. ?Eyes: Conjunctivae clear, anicteric sclerae. Pupils are equal ?Back: lumbar scar healthy ?Lungs: b/l air entry ?Crepts bases ?Heart: s1s2 ?Abdomen: Soft, large paraumbilical hernia- cannot be reduced ?Superficial ulcers and wounds on the abdominal wall ? ? ? ? ?Extremities: atraumatic, no cyanosis. No edema. No clubbing ?Skin: No rashes or lesions. Or bruising ?Lymph: Cervical, supraclavicular normal. ?Neurologic: not examined in detail ? ?Lab Results ?Recent Labs  ?  03/23/21 ?0737 03/24/21 ?0259  ?WBC 11.5*  --   ?HGB 9.9*  --   ?HCT 31.2*  --   ?NA 131*  --   ?K 3.8  --   ?CL 97*  --   ?CO2 23  --   ?BUN 19  --   ?CREATININE 0.76 0.72  ?Microbiology: ?[facility]- 03/16/21- MRSE ?3/16 BC- MRSE ?3/19 BC- NG ?Jan 9.2016 abdominal wound- MSSA ? ?Radiology ?CXR ? ?Personally reviewed- no infiltrate ?CT chest from 03/17/21 ? ?Patchy infiltrate left lingula ? ?Assessment/Plan: ? ?Staph epidermidis bacteremia- unclear source- could be the superficial ulcers from the abdominal wall ?She has lumbar and cervical fusion/foreign body . ?MRI of the lumbar spine does not show any infection ?TEE no endocarditis.-We will do a total of 2 weeks of IV antibiotic.  End date is 04/01/2021. ?Currently on vancomycin.  Will switch to dapto ( cpk N) ? ?Patient complains of right hip pain.  X-ray done was normal.  Pain  better with voltaren gel topically- if worsening then will need CT ?Skin ulcers over the paraumbilical pannus of the abdomen- culture sent ? ?Bronchopneumonia left lingula-flu/covid negative- treated with 5 days of Iv ceftriaxone and azithromycin ?CXR 3/23- showed left pleural effusion with possible volume loss-  ?Received a dose of frusemide today for fluid overload ? ?Weakness -  ? ?Endometrial carcinoma- undergoing radiation ?Hysterectomy planned later ? ?Uterine bleed causing anemia needing Blood transfusion and IV iron as OP ? ?Discussed the management with patient and  hospitalist. ?ID will follow her peripherally this weekend call if needed ? ?  ?OPAT Orders ?Discharge antibiotics: ?Daptomycin '700mg'$  IVPB every 24 hours  ?  ?End Date: ?04/01/21 ?  ?[facility] Care Per Protocol: ?  ?Labs weekly while on IV antibiotics: ?_X_ CBC with differential ?_ ?_X_ CMP ?X CPK ?  ?_X_ Please pull PIC at completion of IV antibiotics ?  ?Fax weekly labs to [phone] ?  ?Clinic Follow Up Appt: 04/05/21 at 11.45am ?  ?  ?

## 2021-03-26 DIAGNOSIS — J189 Pneumonia, unspecified organism: Secondary | ICD-10-CM | POA: Diagnosis not present

## 2021-03-26 LAB — AEROBIC CULTURE W GRAM STAIN (SUPERFICIAL SPECIMEN)
Culture: NO GROWTH
Gram Stain: NONE SEEN

## 2021-03-26 LAB — GLUCOSE, CAPILLARY
Glucose-Capillary: 161 mg/dL — ABNORMAL HIGH (ref 70–99)
Glucose-Capillary: 147 mg/dL — ABNORMAL HIGH (ref 70–99)
Glucose-Capillary: 129 mg/dL — ABNORMAL HIGH (ref 70–99)
Glucose-Capillary: 156 mg/dL — ABNORMAL HIGH (ref 70–99)

## 2021-03-26 MED ORDER — FUROSEMIDE 10 MG/ML IJ SOLN
20.0000 mg | Freq: Once | INTRAMUSCULAR | Status: AC
Start: 2021-03-26 — End: 2021-03-26
  Administered 2021-03-26: 20 mg via INTRAVENOUS
  Filled 2021-03-26: qty 2

## 2021-03-26 NOTE — Progress Notes (Signed)
?PROGRESS NOTE ? ? ? ?Selena Rodgers  ASN:[id number] DOB: December 03, 1950 DOA: 03/16/2021 ?PCP: [name], MD  ? ? ?Brief Narrative:  ?71 y.o. female with medical history significant of arthritis and endometrial cancer, dm II, HTN.  Patient is receiving radiation treatment at the cancer center.  She felt extreme fatigued at home.  Could not go for radiation treatment on the day of admission.  She also mentioned cough shortness of breath.  She was found to have hyponatremia.  CT scan raised concern for pneumonia.  She was hospitalized for further management.  There was also some concern for cellulitis in her left abdominal pannus.  Started on antibiotics.  Found to have Staph epidermidis bacteremia.  Initially thought to be contaminant but repeat blood cultures also showed similar organism.  ID was consulted.   ? ? ?Assessment & Plan: ?  ?Principal Problem: ?  CAP (community acquired pneumonia) ?Active Problems: ?  Cellulitis of abdominal wall ?  Bacteremia ?  Generalized weakness ?  Endometrial cancer ([place]) ?  Hyponatremia ?  Anemia ?  Diabetes ([place]) ?  Back pain ?  GERD (gastroesophageal reflux disease) ?  Current tobacco use ?  Cellulitis ? ? ?CAP (community acquired pneumonia) ?Patient presented with generalized weakness cough shortness of breath.  Chest x-ray did not show any infiltrate but CT scan did raise concern for pneumonia.  ?Required oxygen briefly. ?Remains afebrile.  WBC is normal. ?Ceftriaxone and azithromycin for 5 days.   ?Respiratory status has improved.  Occasional cough.   ?Saturating normal on room air.  No further CAP coverage ?  ?Bacteremia ?Staphylococcus epidermidis bacteremia ?BC ID did turn positive for Staph epidermidis.  Repeat set also reveals Staph epidermidis.  Patient started on vancomycin.   ?TEE negative on 3/23 ?ID following ?PICC line in place ?Transitioned to IV daptomycin ?ID outpatient antibiotic orders in place ?Plan: ?IV daptomycin per ID recommendations ?Will need 2-week  course ?PICC line in place ? ?Cellulitis of abdominal wall ?Patient has maceration of the skin over the left abdomen/pannus.  Erythema was noted suggesting cellulitis.  No clear abscess. ?Seen by wound care.  Wound care dressing changes to be performed. ?Plan: ?Daptomycin as above ?Local wound care ?  ?Endometrial cancer ([place]) ?Has been receiving radiation treatment.  Supposed to end March 29.  ?Oncology was notified of admission.  Radiation treatments on hold.  ?Patient mentioned that she passed 2 small clots vaginally.  Hemoglobin stable. ?Plan: ?Radiation currently on hold as patient will go to skilled nursing facility.  This has been discussed with outpatient radiation oncology ?  ?Generalized weakness ?Hypokalemia ?  ?Likely due to dehydration, pneumonia, anemia.  Vital signs are stable.  Lactic acid level was normal.  CK was noted to be elevated at 819, improved to 709.  ?Needs a skilled nursing facility for rehabilitation per physical therapy. ?Continue to replete potassium. ?  ?Hyponatremia ?Previously normal.  Likely due to dehydration.  ?Sodium levels low but stable.   ?Urine osmolality is 970.  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  Urine sodium was 43.   ?Continue with fluid restriction and salt tablets.  Sodium level slightly better today.  Stable for the most part.  Continue to monitor. ?  ?Diabetes ([place]) ?Monitor CBGs.  Continue SSI. ?  ?  ?Anemia ?Anemia likely multifactorial.  Previously has had acute on chronic blood loss anemia from vaginal bleeding.  She has required blood transfusion. ?Now there is some element of anemia of chronic disease.  Hemoglobin  low but stable.  Continue to monitor.  ?  ?Back pain ?Patient with chronic history of back pain but has noticed worsening leg pain as well as leg weakness in the last few days.  Denies any falls in her back.  She is able to move her legs but has diminished strength.   ?She has had spinal fusion surgery previously.  [name] in [place] is her  neurosurgeon. ?MRI of the lumbar spine was done on 3/18 which does show significant disease but no different from previous MRI which was done in 2021.  Patient and sister reassured.   ?-X-ray right hip negative for fracture or dislocation ?Plan: ?As needed pain control with Voltaren gel, appears effective ?  ?  ?GERD (gastroesophageal reflux disease) ?Vomiting ?Patient continues to endorse reflux of food into the back of the throat.  Was started on IV PPI 3/21.  Repeat CT scan reassuring.  Abdominal exam reassuring ?Plan: ?P.o. PPI ?P.o. H2 blocker ?P.o. simethicone ?As needed Mylanta ?As needed antiemetics ? ?  ?Current tobacco use ?Nicotine patch. ?  ?  ?  ?Morbid obesity ?Estimated body mass index is 44.64 kg/m? as calculated from the following: ?  Height as of this encounter: '5\' 7"'$  (1.702 m). ?  Weight as of this encounter: 129.3 kg. ?This complicates overall care and prognosis ? ?DVT prophylaxis: SQ heparin ?Code Status: Full ?Family Communication: [name] [phone] on 3/22 ?Disposition Plan: Status is: Inpatient ?Remains inpatient appropriate because: I intractable pain.  Decreased ability to ambulate.  Anticipate medical readiness for discharge in 24 hours ?  ?Level of care: Med-Surg ? ?Consultants:  ?ID ? ?Procedures:  ?None ? ?Antimicrobials: ?Daptomycin ? ? ?Subjective: ?Patient seen and examined.  Respiratory status improved. ? ?Objective: ?Vitals:  ? 03/25/21 2044 03/25/21 2048 03/26/21 0448 03/26/21 0758  ?BP: (!) 141/79  (!) 142/82 (!) 147/69  ?Pulse: (!) 101  93 93  ?Resp: '16  18 16  '$ ?Temp: 98.2 ?F (36.8 ?C)  97.9 ?F (36.6 ?C) 97.8 ?F (36.6 ?C)  ?TempSrc: Oral  Oral   ?SpO2: 94% 94% 95% 95%  ?Weight:      ?Height:      ? ? ?Intake/Output Summary (Last 24 hours) at 03/26/2021 1017 ?Last data filed at 03/26/2021 0500 ?Gross per 24 hour  ?Intake 64 ml  ?Output 300 ml  ?Net -236 ml  ? ?Filed Weights  ? 03/16/21 2315  ?Weight: 129.3 kg  ? ? ?Examination: ? ?General exam: No acute  distress ?Respiratory system: Lungs clear.  Normal work of breathing.  Room air ?Cardiovascular system: S1-S2, RRR, no murmurs, no pedal edema ?Gastrointestinal system: Obese, significant left periumbilical hernia, erythema over pannus ?Central nervous system: Alert and oriented. No focal neurological deficits. ?Extremities: Decreased range of motion bilateral lower extremities ?Skin: No rashes, lesions or ulcers ?Psychiatry: Judgement and insight appear normal. Mood & affect appropriate.  ? ? ? ?Data Reviewed: I have personally reviewed following labs and imaging studies ? ?CBC: ?Recent Labs  ?Lab 03/20/21 ?3329 03/21/21 ?0423 03/22/21 ?0300 03/23/21 ?5188  ?WBC 5.2 6.6 8.4 11.5*  ?NEUTROABS  --   --   --  9.6*  ?HGB 8.6* 9.2* 9.4* 9.9*  ?HCT 26.9* 28.3* 29.1* 31.2*  ?MCV 84.3 83.0 82.2 84.1  ?PLT 142* 149* 245 297  ? ?Basic Metabolic Panel: ?Recent Labs  ?Lab 03/20/21 ?4166 03/21/21 ?0423 03/22/21 ?0300 03/23/21 ?0630 03/24/21 ?0259  ?NA 130* 129* 132* 131*  --   ?K 3.6 3.5 3.4* 3.8  --   ?  CL 102 100 99 97*  --   ?CO2 20* 21* 22 23  --   ?GLUCOSE 140* 134* 137* 153*  --   ?BUN '11 12 15 19  '$ --   ?CREATININE 0.51 0.52 0.59 0.76 0.72  ?CALCIUM 8.7* 8.7* 8.9 8.8*  --   ? ?GFR: ?Estimated Creatinine Clearance: 94.2 mL/min (by C-G formula based on SCr of 0.72 mg/dL). ?Liver Function Tests: ?No results for input(s): AST, ALT, ALKPHOS, BILITOT, PROT, ALBUMIN in the last 168 hours. ? ?No results for input(s): LIPASE, AMYLASE in the last 168 hours. ?No results for input(s): AMMONIA in the last 168 hours. ?Coagulation Profile: ?No results for input(s): INR, PROTIME in the last 168 hours. ? ?Cardiac Enzymes: ?Recent Labs  ?Lab 03/25/21 ?0606  ?CKTOTAL 42  ? ?BNP (last 3 results) ?No results for input(s): PROBNP in the last 8760 hours. ?HbA1C: ?No results for input(s): HGBA1C in the last 72 hours. ?CBG: ?Recent Labs  ?Lab 03/25/21 ?0827 03/25/21 ?1227 03/25/21 ?1625 03/25/21 ?2025 03/26/21 ?0800  ?GLUCAP 144* 165* 162* 154*  161*  ? ?Lipid Profile: ?No results for input(s): CHOL, HDL, LDLCALC, TRIG, CHOLHDL, LDLDIRECT in the last 72 hours. ?Thyroid Function Tests: ?No results for input(s): TSH, T4TOTAL, FREET4, T3FREE, THYROIDAB in the last

## 2021-03-26 NOTE — Plan of Care (Signed)
  Problem: Health Behavior/Discharge Planning: Goal: Ability to manage health-related needs will improve Outcome: Progressing   

## 2021-03-26 NOTE — TOC Progression Note (Signed)
Transition of Care (TOC) - Progression Note  ? ? ?Patient Details  ?Name: Selena Rodgers ?MRN: [id number] ?Date of Birth: 11-13-1950 ? ?Transition of Care (TOC) CM/SW Contact  ?[place], [place], [place] ?Phone Number: ?03/26/2021, 12:19 PM ? ?Clinical Narrative:    ? ?Phone call to admissions coordinator Winnifred Friar. Per provider, patient should be stable for discharge on 03/27/21. Voicemail left requesting a return call. ? ? ?Ancelmo Hunt, LCSW ?Transition of Care ?[phone] ? ? ?  ?  ? ?Expected Discharge Plan and Services ?  ?  ?  ?  ?  ?                ?  ?  ?  ?  ?  ?  ?  ?  ?  ?  ? ? ?Social Determinants of Health (SDOH) Interventions ?  ? ?Readmission Risk Interventions ?   ? View : No data to display.  ?  ?  ?  ? ? ?

## 2021-03-27 ENCOUNTER — Inpatient Hospital Stay: Payer: Medicare HMO

## 2021-03-27 DIAGNOSIS — J189 Pneumonia, unspecified organism: Secondary | ICD-10-CM | POA: Diagnosis not present

## 2021-03-27 DIAGNOSIS — R7881 Bacteremia: Secondary | ICD-10-CM | POA: Diagnosis not present

## 2021-03-27 LAB — CBC WITH DIFFERENTIAL/PLATELET
Abs Immature Granulocytes: 0.22 10*3/uL — ABNORMAL HIGH (ref 0.00–0.07)
Basophils Absolute: 0 10*3/uL (ref 0.0–0.1)
Basophils Relative: 0 %
Eosinophils Absolute: 0.1 10*3/uL (ref 0.0–0.5)
Eosinophils Relative: 1 %
HCT: 26.9 % — ABNORMAL LOW (ref 36.0–46.0)
Hemoglobin: 8.4 g/dL — ABNORMAL LOW (ref 12.0–15.0)
Immature Granulocytes: 2 %
Lymphocytes Relative: 3 %
Lymphs Abs: 0.4 10*3/uL — ABNORMAL LOW (ref 0.7–4.0)
MCH: 26.6 pg (ref 26.0–34.0)
MCHC: 31.2 g/dL (ref 30.0–36.0)
MCV: 85.1 fL (ref 80.0–100.0)
Monocytes Absolute: 0.7 10*3/uL (ref 0.1–1.0)
Monocytes Relative: 6 %
Neutro Abs: 10.1 10*3/uL — ABNORMAL HIGH (ref 1.7–7.7)
Neutrophils Relative %: 88 %
Platelets: 319 10*3/uL (ref 150–400)
RBC: 3.16 MIL/uL — ABNORMAL LOW (ref 3.87–5.11)
RDW: 18 % — ABNORMAL HIGH (ref 11.5–15.5)
WBC: 11.6 10*3/uL — ABNORMAL HIGH (ref 4.0–10.5)
nRBC: 0 % (ref 0.0–0.2)

## 2021-03-27 LAB — URINALYSIS, COMPLETE (UACMP) WITH MICROSCOPIC
Bacteria, UA: NONE SEEN
Bilirubin Urine: NEGATIVE
Glucose, UA: NEGATIVE mg/dL
Ketones, ur: NEGATIVE mg/dL
Nitrite: NEGATIVE
Protein, ur: NEGATIVE mg/dL
Specific Gravity, Urine: 1.01 (ref 1.005–1.030)
pH: 5 (ref 5.0–8.0)

## 2021-03-27 LAB — BASIC METABOLIC PANEL
Anion gap: 9 (ref 5–15)
BUN: 22 mg/dL (ref 8–23)
CO2: 22 mmol/L (ref 22–32)
Calcium: 8.5 mg/dL — ABNORMAL LOW (ref 8.9–10.3)
Chloride: 97 mmol/L — ABNORMAL LOW (ref 98–111)
Creatinine, Ser: 0.76 mg/dL (ref 0.44–1.00)
GFR, Estimated: >60 mL/min (ref >60)
Glucose, Bld: 160 mg/dL — ABNORMAL HIGH (ref 70–99)
Potassium: 4.5 mmol/L (ref 3.5–5.1)
Sodium: 128 mmol/L — ABNORMAL LOW (ref 135–145)

## 2021-03-27 LAB — GLUCOSE, CAPILLARY
Glucose-Capillary: 274 mg/dL — ABNORMAL HIGH (ref 70–99)
Glucose-Capillary: 132 mg/dL — ABNORMAL HIGH (ref 70–99)
Glucose-Capillary: 141 mg/dL — ABNORMAL HIGH (ref 70–99)
Glucose-Capillary: 125 mg/dL — ABNORMAL HIGH (ref 70–99)

## 2021-03-27 LAB — MAGNESIUM: Magnesium: 2.6 mg/dL — ABNORMAL HIGH (ref 1.7–2.4)

## 2021-03-27 MED ORDER — ALBUTEROL SULFATE (2.5 MG/3ML) 0.083% IN NEBU: 2.5000 mg | INHALATION_SOLUTION | RESPIRATORY_TRACT | Status: DC

## 2021-03-27 MED ORDER — ALBUTEROL SULFATE (2.5 MG/3ML) 0.083% IN NEBU: 2.5000 mg | INHALATION_SOLUTION | RESPIRATORY_TRACT | Status: DC | PRN

## 2021-03-27 MED ORDER — ALBUTEROL SULFATE (2.5 MG/3ML) 0.083% IN NEBU
2.5000 mg | INHALATION_SOLUTION | Freq: Three times a day (TID) | RESPIRATORY_TRACT | Status: DC
  Administered 2021-03-27 – 2021-03-29 (×5): 2.5 mg via RESPIRATORY_TRACT
  Filled 2021-03-27 (×6): qty 3

## 2021-03-27 MED ORDER — ALBUTEROL SULFATE (2.5 MG/3ML) 0.083% IN NEBU: 2.5000 mg | INHALATION_SOLUTION | Freq: Once | RESPIRATORY_TRACT | Status: DC

## 2021-03-27 NOTE — Progress Notes (Signed)
Pt has increasingly gotten more confused. States she is in a basement and adamant about getting up to get in her chair. Pt is weak and can barely move legs. Explained weakness and pt  saying to get out her house. Day shift docs aware of confusion. Will cont to monitor.  ?

## 2021-03-27 NOTE — Progress Notes (Signed)
Pt scored a 4 on the RT protocol assessment. She has rhonchi but no wheezes at this time. The nebulizer treatments are to be changed to TID and prn per protocol. ?

## 2021-03-27 NOTE — Progress Notes (Signed)
?PROGRESS NOTE ? ? ? ?Selena Rodgers  JIR:[id number] DOB: May 08, 1950 DOA: 03/16/2021 ?PCP: [name], MD  ? ? ?Brief Narrative:  ?71 y.o. female with medical history significant of arthritis and endometrial cancer, dm II, HTN.  Patient is receiving radiation treatment at the cancer center.  She felt extreme fatigued at home.  Could not go for radiation treatment on the day of admission.  She also mentioned cough shortness of breath.  She was found to have hyponatremia.  CT scan raised concern for pneumonia.  She was hospitalized for further management.  There was also some concern for cellulitis in her left abdominal pannus.  Started on antibiotics.  Found to have Staph epidermidis bacteremia.  Initially thought to be contaminant but repeat blood cultures also showed similar organism.  ID was consulted.   ? ?3/26: Sister concerned about some confusion noted from patient last night.  Patient states she is in the basement and seeing things.  Following morning patient is alert and oriented x4.  Suspect hospital-acquired delirium. ? ? ?Assessment & Plan: ?  ?Principal Problem: ?  CAP (community acquired pneumonia) ?Active Problems: ?  Cellulitis of abdominal wall ?  Bacteremia ?  Generalized weakness ?  Endometrial cancer ([place]) ?  Hyponatremia ?  Anemia ?  Diabetes ([place]) ?  Back pain ?  GERD (gastroesophageal reflux disease) ?  Current tobacco use ?  Cellulitis ? ?Acute encephalopathy ?Unclear etiology, suspect hospital-acquired delirium ?Sister concerned as patient had endorsed that she was in the basement and seeing things ?This was noted on 3/20 5 PM ?On 3/20 6 AM patient is alert and oriented x4 ?Suspect hospital-acquired delirium ?Plan: ?DC gabapentin ?Frequent reorienting measures ?Plan on discharge to skilled nursing facility 3/27 ? ?  ?Bacteremia ?Staphylococcus epidermidis bacteremia ?BC ID did turn positive for Staph epidermidis.  Repeat set also reveals Staph epidermidis.  Patient started on  vancomycin.   ?TEE negative on 3/23 ?ID following ?PICC line in place ?Transitioned to IV daptomycin ?ID outpatient antibiotic orders in place ?Plan: ?IV daptomycin per ID recommendations ?Will need 2-week course ?PICC line in place ? ?Cellulitis of abdominal wall ?Patient has maceration of the skin over the left abdomen/pannus.  Erythema was noted suggesting cellulitis.  No clear abscess. ?Seen by wound care.  Wound care dressing changes to be performed. ?Plan: ?Daptomycin as above ?Local wound care ? ?CAP (community acquired pneumonia) ?Patient presented with generalized weakness cough shortness of breath.  Chest x-ray did not show any infiltrate but CT scan did raise concern for pneumonia.  ?Required oxygen briefly. ?Remains afebrile.  WBC is normal. ?Ceftriaxone and azithromycin for 5 days.   ?Respiratory status has improved.  Occasional cough.   ?Saturating normal on room air.  No further CAP coverage ?  ?Endometrial cancer ([place]) ?Has been receiving radiation treatment.  Supposed to end March 29.  ?Oncology was notified of admission.  Radiation treatments on hold.  ?Patient mentioned that she passed 2 small clots vaginally.  Hemoglobin stable. ?Plan: ?Radiation currently on hold as patient will go to skilled nursing facility.  This has been discussed with outpatient radiation oncology ?  ?Generalized weakness ?Hypokalemia ?  ?Likely due to dehydration, pneumonia, anemia.  Vital signs are stable.  Lactic acid level was normal.  CK was noted to be elevated at 819, improved to 709.  ?Needs a skilled nursing facility for rehabilitation per physical therapy. ?Continue to replete potassium. ?  ?Hyponatremia ?Previously normal.  Likely due to dehydration.  ?Sodium levels low  but stable.   ?Urine osmolality is 970.  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  Urine sodium was 43.   ?Continue with fluid restriction and salt tablets.  Sodium level slightly better today.  Stable for the most part.  Continue to monitor. ?   ?Diabetes ([place]) ?Monitor CBGs.  Continue SSI. ?  ?  ?Anemia ?Anemia likely multifactorial.  Previously has had acute on chronic blood loss anemia from vaginal bleeding.  She has required blood transfusion. ?Now there is some element of anemia of chronic disease.  Hemoglobin low but stable.  Continue to monitor.  ?  ?Back pain ?Patient with chronic history of back pain but has noticed worsening leg pain as well as leg weakness in the last few days.  Denies any falls in her back.  She is able to move her legs but has diminished strength.   ?She has had spinal fusion surgery previously.  [name] in [place] is her neurosurgeon. ?MRI of the lumbar spine was done on 3/18 which does show significant disease but no different from previous MRI which was done in 2021.  Patient and sister reassured.   ?-X-ray right hip negative for fracture or dislocation ?Plan: ?As needed pain control with Voltaren gel, appears effective ?  ?  ?GERD (gastroesophageal reflux disease) ?Vomiting ?Patient continues to endorse reflux of food into the back of the throat.  Was started on IV PPI 3/21.  Repeat CT scan reassuring.  Abdominal exam reassuring ?Plan: ?P.o. PPI ?P.o. H2 blocker ?P.o. simethicone ?As needed Mylanta ?As needed antiemetics ? ?  ?Current tobacco use ?Nicotine patch. ?  ?  ?  ?Morbid obesity ?Estimated body mass index is 44.64 kg/m? as calculated from the following: ?  Height as of this encounter: '5\' 7"'$  (1.702 m). ?  Weight as of this encounter: 129.3 kg. ?This complicates overall care and prognosis ? ?DVT prophylaxis: SQ heparin ?Code Status: Full ?Family Communication: [name] [phone] on 3/22, 3/25 ?Disposition Plan: Status is: Inpatient ?Remains inpatient appropriate because: Acute encephalopathy.  Decreased ability to ambulate.  Suspect hospital-acquired delirium.  Anticipate medical redness for discharge 3/27. ?  ?Level of care: Med-Surg ? ?Consultants:  ?ID ? ?Procedures:   ?None ? ?Antimicrobials: ?Daptomycin ? ? ?Subjective: ?Patient seen and examined.  Respiratory status improved.  Alert and oriented x4.  Answers all questions appropriately. ? ?Objective: ?Vitals:  ? 03/26/21 1951 03/27/21 0423 03/27/21 6144 03/27/21 0820  ?BP:  (!) 147/64 (!) 143/70   ?Pulse:  87 89   ?Resp:  18 17   ?Temp:  97.9 ?F (36.6 ?C) 97.7 ?F (36.5 ?C)   ?TempSrc:  Oral Oral   ?SpO2: 94% 97% 95% 95%  ?Weight:      ?Height:      ? ? ?Intake/Output Summary (Last 24 hours) at 03/27/2021 0952 ?Last data filed at 03/26/2021 1300 ?Gross per 24 hour  ?Intake --  ?Output 700 ml  ?Net -700 ml  ? ?Filed Weights  ? 03/16/21 2315  ?Weight: 129.3 kg  ? ? ?Examination: ? ?General exam: NAD.  Appears fatigued ?Respiratory system: Bibasilar crackles.  Normal work of breathing.  Room air ?Cardiovascular system: S1-S2, RRR, no murmurs, no pedal edema ?Gastrointestinal system: Obese, significant left periumbilical hernia, erythema over pannus ?Central nervous system: Alert and oriented. No focal neurological deficits. ?Extremities: Decreased range of motion bilateral lower extremities ?Skin: No rashes, lesions or ulcers ?Psychiatry: Judgement and insight appear normal. Mood & affect appropriate.  ? ? ? ?Data Reviewed: I have  personally reviewed following labs and imaging studies ? ?CBC: ?Recent Labs  ?Lab 03/21/21 ?0423 03/22/21 ?0300 03/23/21 ?8828 03/27/21 ?0834  ?WBC 6.6 8.4 11.5* 11.6*  ?NEUTROABS  --   --  9.6* 10.1*  ?HGB 9.2* 9.4* 9.9* 8.4*  ?HCT 28.3* 29.1* 31.2* 26.9*  ?MCV 83.0 82.2 84.1 85.1  ?PLT 149* 245 297 319  ? ?Basic Metabolic Panel: ?Recent Labs  ?Lab 03/21/21 ?0423 03/22/21 ?0300 03/23/21 ?0034 03/24/21 ?0259 03/27/21 ?0834  ?NA 129* 132* 131*  --  128*  ?K 3.5 3.4* 3.8  --  4.5  ?CL 100 99 97*  --  97*  ?CO2 21* 22 23  --  22  ?GLUCOSE 134* 137* 153*  --  160*  ?BUN '12 15 19  '$ --  22  ?CREATININE 0.52 0.59 0.76 0.72 0.76  ?CALCIUM 8.7* 8.9 8.8*  --  8.5*  ?MG  --   --   --   --  2.6*  ? ?GFR: ?Estimated  Creatinine Clearance: 94.2 mL/min (by C-G formula based on SCr of 0.76 mg/dL). ?Liver Function Tests: ?No results for input(s): AST, ALT, ALKPHOS, BILITOT, PROT, ALBUMIN in the last 168 hours. ? ?No results for input(s): LIPASE, AMYLASE in the last

## 2021-03-27 NOTE — Care Management (Signed)
Notified by RN that sister was concerned the patient's mental status is deteriorated.  Patient endorsing that she was in a basement and potentially some visual and auditory hallucinations.  On my evaluation this morning patient is alert and oriented x4.  She acknowledges her confusion.  At this point I suspect hospital-acquired delirium.  Laboratory investigation overall unrevealing.  Head CT ordered and pending.  Will monitor patient in the hospital for today.  Tentative plan to discharge to skilled nursing facility on 3/27. ? ?Ralene Muskrat MD ?

## 2021-03-27 NOTE — Progress Notes (Signed)
Sister called this am, stating she received 2 phone calls during the night from Selena Rodgers during which she appeared confused, stating she is "in the basement, seeing things".  ?Now patient A/Ox4, still stating she is in the basement but also stating that she substitutes the term basement for lower level. Admitting to intermittent confusion. Knows [facility], date off by one day.Dr Priscella Mann notified of sisters concerns.  ?

## 2021-03-28 ENCOUNTER — Ambulatory Visit: Payer: Medicare HMO

## 2021-03-28 LAB — GLUCOSE, CAPILLARY
Glucose-Capillary: 138 mg/dL — ABNORMAL HIGH (ref 70–99)
Glucose-Capillary: 182 mg/dL — ABNORMAL HIGH (ref 70–99)
Glucose-Capillary: 177 mg/dL — ABNORMAL HIGH (ref 70–99)
Glucose-Capillary: 146 mg/dL — ABNORMAL HIGH (ref 70–99)

## 2021-03-28 MED ORDER — FUROSEMIDE 10 MG/ML IJ SOLN
20.0000 mg | Freq: Once | INTRAMUSCULAR | Status: AC
  Administered 2021-03-28: 20 mg via INTRAVENOUS
  Filled 2021-03-28: qty 2

## 2021-03-28 NOTE — Progress Notes (Signed)
Occupational Therapy Treatment ?Patient Details ?Name: Selena Rodgers ?MRN: [id number] ?DOB: 1950/12/03 ?Today's Date: 03/28/2021 ? ? ?History of present illness Pt is a 71 y.o. female presenting to hospital 3/16 for evaluation of weakness (slipped forward out of recliner and unable to get back up).  Pt admitted with generalized weakness, CAP, anemia, SOB, hyponatremia, and endometrial CA (currently receiving treatment).  Concern for cellulitis in L abdominal pannus.  Pt with large pendulous hernia of LLQ of abdomen.  PMH includes endometrial CA (daily radiation), DM, htn, anxiety, chronic back pain, spinal stenosis, uterine CA, ventral and umbilical hernia repair, ACDF, CTR, and foot surgery.  Patient had TEE this morning 3/23 and R hip xray with was negative for fracture or dislocation ?  ?OT comments ? Pt seen for OT co-tx with PT to address ADL transfer training. Pt received seated EOB with PT. Pt completed 3 STS transfers with RW, cues for hand placement, B shoes on, B knees blocked, and Min-MOD A +2 from elevated bed but unable to maintain standing <10 seconds before sitting down. Pt with mild confusion noted during session but does follow commands with cues. Able to assist in pulling herself up in the bed with MOD A +2 for improved positioning. Will continue to progress.   ? ?Recommendations for follow up therapy are one component of a multi-disciplinary discharge planning process, led by the attending physician.  Recommendations may be updated based on patient status, additional functional criteria and insurance authorization. ?   ?Follow Up Recommendations ? Skilled nursing-short term rehab (<3 hours/day)  ?  ?Assistance Recommended at Discharge Frequent or constant Supervision/Assistance  ?Patient can return home with the following ? Two people to help with walking and/or transfers;A lot of help with bathing/dressing/bathroom;Help with stairs or ramp for entrance;Assist for transportation ?  ?Equipment  Recommendations ? Other (comment);BSC/3in1 (defer to next venue)  ?  ?Recommendations for Other Services   ? ?  ?Precautions / Restrictions Precautions ?Precautions: Fall ?Precaution Comments: VERY Large pendulous hernia of LLQ of abdomen ?Restrictions ?Weight Bearing Restrictions: No ?Other Position/Activity Restrictions: Pt wears specialized shoes whenever out of bed (they appear to have built up lateral aspec of B shoes)  ? ? ?  ? ?Mobility Bed Mobility ?Overal bed mobility: Needs Assistance ?Bed Mobility: Sit to Supine ?  ?  ?  ?Sit to supine: Max assist, +2 for physical assistance ?  ?  ?  ? ?Transfers ?Overall transfer level: Needs assistance ?Equipment used: Rolling walker (2 wheels) ?Transfers: Sit to/from Stand ?Sit to Stand: Min assist, Mod assist, +2 physical assistance, From elevated surface ?  ?  ?  ?  ?  ?General transfer comment: bed height significantly elevated; B shoes donned; B knees blocked; vc's for UE placement; assist to initiate stand and control descent sitting; x3 trials ?  ?  ?Balance Overall balance assessment: Needs assistance ?Sitting-balance support: Single extremity supported, Feet supported ?Sitting balance-Leahy Scale: Poor ?Sitting balance - Comments: intermittent CGA (pt feeling off-balance in sitting; BP WFL) ?Postural control: Posterior lean ?Standing balance support: Bilateral upper extremity supported, Reliant on assistive device for balance ?Standing balance-Leahy Scale: Poor ?Standing balance comment: unable to get fully upright in standing ?  ?  ?  ?  ?  ?  ?  ?  ?  ?  ?  ?   ? ?ADL either performed or assessed with clinical judgement  ? ?ADL Overall ADL's : Needs assistance/impaired ?  ?  ?  ?  ?  ?  ?  ?  ?  ?  ?  ?  ?  ?  ?  ?  ?  ?  ?  Functional mobility during ADLs: Minimal assistance;Moderate assistance;Rolling walker (2 wheels);+2 for physical assistance ?  ?  ? ?Extremity/Trunk Assessment   ?  ?  ?  ?  ?  ? ?Vision   ?  ?  ?Perception   ?  ?Praxis   ?  ? ?Cognition  Arousal/Alertness: Awake/alert ?Behavior During Therapy: [facility] for tasks assessed/performed ?Overall Cognitive Status: Within Functional Limits for tasks assessed ?  ?  ?  ?  ?  ?  ?  ?  ?  ?  ?  ?  ?  ?  ?  ?  ?General Comments: Pt appearing confused at times and reports she was told she had hospital delirium.  Pt oriented to person, place, time, and situation. ?  ?  ?   ?Exercises   ? ?  ?Shoulder Instructions   ? ? ?  ?General Comments large LLQ hernia noted  ? ? ?Pertinent Vitals/ Pain       Pain Assessment ?Pain Assessment: Faces ?Faces Pain Scale: Hurts little more ?Pain Location: L hip, knee ?Pain Descriptors / Indicators: Grimacing, Guarding, Sore ?Pain Intervention(s): Limited activity within patient's tolerance, Monitored during session, Repositioned ? ?Home Living   ?  ?  ?  ?  ?  ?  ?  ?  ?  ?  ?  ?  ?  ?  ?  ?  ?  ?  ? ?  ?Prior Functioning/Environment    ?  ?  ?  ?   ? ?Frequency ? Min 3X/week  ? ? ? ? ?  ?Progress Toward Goals ? ?OT Goals(current goals can now be found in the care plan section) ? Progress towards OT goals: Progressing toward goals ? ?Acute Rehab OT Goals ?Patient Stated Goal: get stronger ?OT Goal Formulation: With patient ?Time For Goal Achievement: 04/08/21 ?Potential to Achieve Goals: Good  ?Plan Discharge plan remains appropriate;Frequency remains appropriate   ? ?Co-evaluation ? ? ? PT/OT/SLP Co-Evaluation/Treatment: Yes ?Reason for Co-Treatment: For patient/therapist safety;To address functional/ADL transfers ?PT goals addressed during session: Mobility/safety with mobility;Balance ?OT goals addressed during session: ADL's and self-care;Proper use of Adaptive equipment and DME ?  ? ?  ?AM-PAC OT "6 Clicks" Daily Activity     ?Outcome Measure ? ? Help from another person eating meals?: None ?Help from another person taking care of personal grooming?: A Little ?Help from another person toileting, which includes using toliet, bedpan, or urinal?: Total ?Help from another person  bathing (including washing, rinsing, drying)?: A Lot ?Help from another person to put on and taking off regular upper body clothing?: A Lot ?Help from another person to put on and taking off regular lower body clothing?: A Lot ?6 Click Score: 14 ? ?  ?End of Session Equipment Utilized During Treatment: Rolling walker (2 wheels);Gait belt ? ?OT Visit Diagnosis: Unsteadiness on feet (R26.81);History of falling (Z91.81);Repeated falls (R29.6);Muscle weakness (generalized) (M62.81) ?  ?Activity Tolerance Patient tolerated treatment well ?  ?Patient Left in bed;with call bell/phone within reach;with bed alarm set;Other (comment) (PT) ?  ?Nurse Communication   ?  ? ?   ? ?Time: 1050-1059 ?OT Time Calculation (min): 9 min ? ?Charges: OT General Charges ?$OT Visit: 1 Visit ?OT Treatments ?$Self Care/Home Management : 8-22 mins ? ?Ardeth Perfect., MPH, MS, OTR/L ?ascom [phone] ?03/28/21, 1:20 PM ? ?

## 2021-03-28 NOTE — TOC Progression Note (Signed)
Transition of Care (TOC) - Progression Note  ? ? ?Patient Details  ?Name: Selena Rodgers ?MRN: [id number] ?Date of Birth: 05-30-1950 ? ?Transition of Care (TOC) CM/SW Contact  ?Tanashia Ciesla A Jaquala Fuller, LCSW ?Phone Number: ?03/28/2021, 3:01 PM ? ?Clinical Narrative:   Josem Kaufmann still pending. MD aware. Pt's sister is aware. ? ? ? ?  ?  ? ?Expected Discharge Plan and Services ?  ?  ?  ?  ?  ?                ?  ?  ?  ?  ?  ?  ?  ?  ?  ?  ? ? ?Social Determinants of Health (SDOH) Interventions ?  ? ?Readmission Risk Interventions ?   ? View : No data to display.  ?  ?  ?  ? ? ?

## 2021-03-28 NOTE — Telephone Encounter (Signed)
Called and spoke to [place]. She is being discharged to SNF and they will not accept her on chemo or radiation. Radiation on hold at present. She cannot physically come to appointment on 4/5. Will work with radiation to have appointment rescheduled once radiation resumes. Reports her bleeding has stopped.

## 2021-03-28 NOTE — Progress Notes (Signed)
Physical Therapy Treatment ?Patient Details ?Name: Selena Rodgers ?MRN: [id number] ?DOB: 18-May-1950 ?Today's Date: 03/28/2021 ? ? ?History of Present Illness Pt is a 71 y.o. female presenting to hospital 3/16 for evaluation of weakness (slipped forward out of recliner and unable to get back up).  Pt admitted with generalized weakness, CAP, anemia, SOB, hyponatremia, and endometrial CA (currently receiving treatment).  Concern for cellulitis in L abdominal pannus.  Pt with large pendulous hernia of LLQ of abdomen.  PMH includes endometrial CA (daily radiation), DM, htn, anxiety, chronic back pain, spinal stenosis, uterine CA, ventral and umbilical hernia repair, ACDF, CTR, and foot surgery.  Patient had TEE this morning 3/23 and R hip xray with was negative for fracture or dislocation ? ?  ?PT Comments  ? ? PT/OT co-treatment for part of session.  Pt finishing with bed bath and bed linen change upon PT arrival; pt agreeable to therapy session.  Overall pt oriented x4 but appearing confused during session at times (pt reports being told she has hospital acquired delirium).  Total assist to donn shoes.  Max assist for logrolling in bed; max assist semi-supine to sitting edge of bed; close SBA to CGA for sitting balance (pt feeling off-balance in sitting); min to mod assist x2 to stand from significantly elevated bed height up to bariatric RW (x3 trials);  unable to laterally scoot towards L towards [facility] with 2 assist and max cueing; and 2 assist to lay down in bed and boost up in bed end of session.  Pt continues to be very motivated to participate in therapy.  Will continue to focus on strengthening and progressive functional mobility during hospitalization. ?  ?Recommendations for follow up therapy are one component of a multi-disciplinary discharge planning process, led by the attending physician.  Recommendations may be updated based on patient status, additional functional criteria and insurance  authorization. ? ?Follow Up Recommendations ? Skilled nursing-short term rehab (<3 hours/day) ?  ?  ?Assistance Recommended at Discharge Frequent or constant Supervision/Assistance  ?Patient can return home with the following Two people to help with walking and/or transfers;Two people to help with bathing/dressing/bathroom;Assistance with cooking/housework;Assist for transportation;Help with stairs or ramp for entrance ?  ?Equipment Recommendations ?  (TBD at next venue of care)  ?  ?Recommendations for Other Services OT consult ? ? ?  ?Precautions / Restrictions Precautions ?Precautions: Fall ?Precaution Comments: VERY Large pendulous hernia of LLQ of abdomen ?Restrictions ?Weight Bearing Restrictions: No ?Other Position/Activity Restrictions: Pt wears specialized shoes whenever out of bed (they appear to have built up lateral aspec of B shoes)  ?  ? ?Mobility ? Bed Mobility ?Overal bed mobility: Needs Assistance ?Bed Mobility: Supine to Sit, Sit to Supine ?Rolling: Max assist (logrolling L and R in bed to adjust linen) ?  ?Supine to sit: Max assist, HOB elevated (assist for trunk and B LE's) ?Sit to supine: Max assist, +2 for physical assistance (assist for trunk and B LE's) ?  ?General bed mobility comments: Vc's for technique.  End of session pt layed down in bed early but able to scoot up in bed with 2 assist, use of bed sheets, and pt assisting (bed also angled down to assist with boosting pt up in bed) ?  ? ?Transfers ?Overall transfer level: Needs assistance ?Equipment used: Rolling walker (2 wheels) ?Transfers: Sit to/from Stand ?Sit to Stand: Min assist, Mod assist, +2 physical assistance, From elevated surface ?  ?  ?  ?  ?  ?General transfer comment:  bed height significantly elevated; B shoes donned; B knees blocked; vc's for UE placement; assist to initiate stand and control descent sitting; x3 trials ?  ? ?Ambulation/Gait ?  ?  ?  ?  ?  ?  ?  ?General Gait Details: unable to stand long enough to  attempt ? ? ?Stairs ?  ?  ?  ?  ?  ? ? ?Wheelchair Mobility ?  ? ?Modified Rankin (Stroke Patients Only) ?  ? ? ?  ?Balance Overall balance assessment: Needs assistance ?Sitting-balance support: Single extremity supported, Feet supported ?Sitting balance-Leahy Scale: Poor ?Sitting balance - Comments: intermittent CGA (pt feeling off-balance in sitting; BP WFL) ?  ?Standing balance support: Bilateral upper extremity supported, Reliant on assistive device for balance ?Standing balance-Leahy Scale: Poor ?Standing balance comment: unable to get fully upright in standing ?  ?  ?  ?  ?  ?  ?  ?  ?  ?  ?  ?  ? ?  ?Cognition Arousal/Alertness: Awake/alert ?Behavior During Therapy: [facility] for tasks assessed/performed ?Overall Cognitive Status: Within Functional Limits for tasks assessed ?  ?  ?  ?  ?  ?  ?  ?  ?  ?  ?  ?  ?  ?  ?  ?  ?General Comments: Pt appearing confused at times and reports she was told she had hospital delirium.  Pt oriented to person, place, time, and situation. ?  ?  ? ?  ?Exercises   ? ?  ?General Comments General comments (skin integrity, edema, etc.): Large LLQ hernia noted.  Nursing cleared pt for participation in physical therapy.  Pt agreeable to PT session. ?  ?  ? ?Pertinent Vitals/Pain Pain Assessment ?Pain Assessment: Faces ?Faces Pain Scale: Hurts little more ?Pain Location: L hip, knee ?Pain Descriptors / Indicators: Grimacing, Guarding, Sore ?Pain Intervention(s): Limited activity within patient's tolerance, Monitored during session, Repositioned ?Vitals (HR and O2 on room air) stable and WFL throughout treatment session.  ? ? ?Home Living   ?  ?  ?  ?  ?  ?  ?  ?  ?  ?   ?  ?Prior Function    ?  ?  ?   ? ?PT Goals (current goals can now be found in the care plan section) Acute Rehab PT Goals ?Patient Stated Goal: to improve strength and mobility ?PT Goal Formulation: With patient ?Time For Goal Achievement: 04/01/21 ?Potential to Achieve Goals: Fair ?Progress towards PT goals: Progressing  toward goals ? ?  ?Frequency ? ? ? Min 2X/week ? ? ? ?  ?PT Plan Current plan remains appropriate  ? ? ?Co-evaluation PT/OT/SLP Co-Evaluation/Treatment: Yes ?Reason for Co-Treatment: For patient/therapist safety;To address functional/ADL transfers ?PT goals addressed during session: Mobility/safety with mobility;Balance ?OT goals addressed during session: ADL's and self-care ?  ? ?  ?AM-PAC PT "6 Clicks" Mobility   ?Outcome Measure ? Help needed turning from your back to your side while in a flat bed without using bedrails?: A Lot ?Help needed moving from lying on your back to sitting on the side of a flat bed without using bedrails?: A Lot ?Help needed moving to and from a bed to a chair (including a wheelchair)?: Total ?Help needed standing up from a chair using your arms (e.g., wheelchair or bedside chair)?: Total ?Help needed to walk in hospital room?: Total ?Help needed climbing 3-5 steps with a railing? : Total ?6 Click Score: 8 ? ?  ?End of Session Equipment Utilized  During Treatment: Gait belt ?Activity Tolerance: Patient tolerated treatment well ?Patient left: in bed;with call bell/phone within reach;with bed alarm set ?Nurse Communication: Mobility status;Precautions ?PT Visit Diagnosis: Other abnormalities of gait and mobility (R26.89);Muscle weakness (generalized) (M62.81);Pain ?Pain - Right/Left: Left ?Pain - part of body: Knee;Hip ?  ? ? ?Time: 1030-1108 ?PT Time Calculation (min) (ACUTE ONLY): 38 min ? ?Charges:  $Therapeutic Activity: 23-37 mins          ?          ?Leitha Bleak, PT ?03/28/21, 11:29 AM ? ? ?

## 2021-03-28 NOTE — TOC Progression Note (Signed)
Transition of Care (TOC) - Progression Note  ? ? ?Patient Details  ?Name: ILIZA BLANKENBECKLER ?MRN: [id number] ?Date of Birth: Jul 03, 1950 ? ?Transition of Care (TOC) CM/SW Contact  ?Jenae Tomasello A Willman Cuny, LCSW ?Phone Number: ?03/28/2021, 8:19 AM ? ?Clinical Narrative:   Auth expired 3/26. Auth restarted today. ? ? ? ?  ?  ? ?Expected Discharge Plan and Services ?  ?  ?  ?  ?  ?                ?  ?  ?  ?  ?  ?  ?  ?  ?  ?  ? ? ?Social Determinants of Health (SDOH) Interventions ?  ? ?Readmission Risk Interventions ?   ? View : No data to display.  ?  ?  ?  ? ? ?

## 2021-03-28 NOTE — Progress Notes (Signed)
?PROGRESS NOTE ? ? ? ?MINDEL Rodgers  FAO:[id number] DOB: 1950-04-03 DOA: 03/16/2021 ?PCP: Selena Hire, MD  ? ? ?Brief Narrative:  ?71 y.o. female with medical history significant of arthritis and endometrial cancer, dm II, HTN.  Patient is receiving radiation treatment at the cancer center.  She felt extreme fatigued at home.  Could not go for radiation treatment on the day of admission.  She also mentioned cough shortness of breath.  She was found to have hyponatremia.  CT scan raised concern for pneumonia.  She was hospitalized for further management.  There was also some concern for cellulitis in her left abdominal pannus.  Started on antibiotics.  Found to have Staph epidermidis bacteremia.  Initially thought to be contaminant but repeat blood cultures also showed similar organism.  ID was consulted.   ? ?3/26: Sister concerned about some confusion noted from patient last night.  Patient states she is in the basement and seeing things.  Following morning patient is alert and oriented x4.  Suspect hospital-acquired delirium. ? ?3/27: Insurance auth pending.  Previous authorization expired 3/26.  Authorization restarted 3/27.  Encephalopathy waxing and waning.  Work-up negative.  Suspect hospital-acquired delirium. ? ? ?Assessment & Plan: ?  ?Principal Problem: ?  CAP (community acquired pneumonia) ?Active Problems: ?  Cellulitis of abdominal wall ?  Bacteremia ?  Generalized weakness ?  Endometrial cancer ([place]) ?  Hyponatremia ?  Anemia ?  Diabetes ([place]) ?  Back pain ?  GERD (gastroesophageal reflux disease) ?  Current tobacco use ?  Cellulitis ? ?Acute encephalopathy ?Strongly suspect hospital-acquired delirium ?Sister concerned as patient had endorsed that she was in the basement and seeing things ?This was noted on 3/20 5 PM ?On 3/20 6 AM patient is alert and oriented x4 ?Plan: ?Frequent reorienting measures ?Stable for discharge to skilled nursing facility ?Insurance authorization  pending ? ?Bacteremia ?Staphylococcus epidermidis bacteremia ?BC ID did turn positive for Staph epidermidis.  Repeat set also reveals Staph epidermidis.  Patient started on vancomycin.   ?TEE negative on 3/23 ?ID following ?PICC line in place ?Transitioned to IV daptomycin ?ID outpatient antibiotic orders in place ?Plan: ?IV daptomycin per ID recommendations ?Will need 2-week course ?PICC line in place ? ?Cellulitis of abdominal wall ?Patient has maceration of the skin over the left abdomen/pannus.  Erythema was noted suggesting cellulitis.  No clear abscess. ?Seen by wound care.  Wound care dressing changes to be performed. ?Plan: ?Daptomycin as above ?Local wound care ? ?CAP (community acquired pneumonia) ?Patient presented with generalized weakness cough shortness of breath.  Chest x-ray did not show any infiltrate but CT scan did raise concern for pneumonia.  ?Required oxygen briefly. ?Remains afebrile.  WBC is normal. ?Ceftriaxone and azithromycin for 5 days.   ?Respiratory status has improved.  Occasional cough.   ?Saturating normal on room air.  No further CAP coverage ?  ?Endometrial cancer ([place]) ?Has been receiving radiation treatment.  Supposed to end March 29.  ?Oncology was notified of admission.  Radiation treatments on hold.  ?Patient mentioned that she passed 2 small clots vaginally.  Hemoglobin stable. ?Plan: ?Radiation currently on hold as patient will go to skilled nursing facility.  This has been discussed with outpatient radiation oncology ?  ?Generalized weakness ?Hypokalemia ?  ?Likely due to dehydration, pneumonia, anemia.  Vital signs are stable.  Lactic acid level was normal.  CK was noted to be elevated at 819, improved to 709.  ?Needs a skilled nursing facility for rehabilitation per  physical therapy. ?Continue to replete potassium. ?  ?Hyponatremia ?Previously normal.  Likely due to dehydration.  ?Sodium levels low but stable.   ?Urine osmolality is 970.  This could be suggestive of  SIADH.  Urine sodium was 43.   ?Continue with fluid restriction and salt tablets.  Sodium level slightly better today.  Stable for the most part.  Continue to monitor. ?  ?Diabetes ([place]) ?Monitor CBGs.  Continue SSI. ?  ?  ?Anemia ?Anemia likely multifactorial.  Previously has had acute on chronic blood loss anemia from vaginal bleeding.  She has required blood transfusion. ?Now there is some element of anemia of chronic disease.  Hemoglobin low but stable.  Continue to monitor.  ?  ?Back pain ?Patient with chronic history of back pain but has noticed worsening leg pain as well as leg weakness in the last few days.  Denies any falls in her back.  She is able to move her legs but has diminished strength.   ?She has had spinal fusion surgery previously.  [name] in [place] is her neurosurgeon. ?MRI of the lumbar spine was done on 3/18 which does show significant disease but no different from previous MRI which was done in 2021.  Patient and sister reassured.   ?-X-ray right hip negative for fracture or dislocation ?Plan: ?As needed pain control with Voltaren gel, appears effective ?  ?  ?GERD (gastroesophageal reflux disease) ?Vomiting ?Patient continues to endorse reflux of food into the back of the throat.  Was started on IV PPI 3/21.  Repeat CT scan reassuring.  Abdominal exam reassuring ?Plan: ?P.o. PPI ?P.o. H2 blocker ?P.o. simethicone ?As needed Mylanta ?As needed antiemetics ? ?  ?Current tobacco use ?Nicotine patch. ?  ?  ?  ?Morbid obesity ?Estimated body mass index is 44.64 kg/m? as calculated from the following: ?  Height as of this encounter: '5\' 7"'$  (1.702 m). ?  Weight as of this encounter: 129.3 kg. ?This complicates overall care and prognosis ? ?DVT prophylaxis: SQ heparin ?Code Status: Full ?Family Communication: [name] [phone] on 3/22, 3/25, 3/26 ?Disposition Plan: Status is: Inpatient ?Remains inpatient appropriate because: Unsafe discharge plan.  Stable for discharge to skilled  nursing facility.  Insurance authorization pending. ?  ?Level of care: Med-Surg ? ?Consultants:  ?ID ? ?Procedures:  ?None ? ?Antimicrobials: ?Daptomycin ? ? ?Subjective: ?Patient seen and examined.  Intermittently encephalopathic.  Normal work of breathing.  Remains on room air. ? ?Objective: ?Vitals:  ? 03/28/21 0715 03/28/21 0757 03/28/21 1150 03/28/21 1516  ?BP: (!) 154/64  140/67 137/67  ?Pulse: 87  88 91  ?Resp: '16  18 18  '$ ?Temp: 98.3 ?F (36.8 ?C)  98.2 ?F (36.8 ?C) 97.7 ?F (36.5 ?C)  ?TempSrc: Axillary  Oral Oral  ?SpO2: 97% 98% 96% 96%  ?Weight:      ?Height:      ? ? ?Intake/Output Summary (Last 24 hours) at 03/28/2021 1554 ?Last data filed at 03/28/2021 1524 ?Gross per 24 hour  ?Intake 600 ml  ?Output 1500 ml  ?Net -900 ml  ? ?Filed Weights  ? 03/16/21 2315  ?Weight: 129.3 kg  ? ? ?Examination: ? ?General exam: NAD.  Fatigued ?Respiratory system: Coarse breath sounds bilaterally.  Normal work of breathing.  Room air ?Cardiovascular system: S1-S2, RRR, no murmurs, no pedal edema ?Gastrointestinal system: Obese, significant left periumbilical hernia, erythema over pannus ?Central nervous system: Alert and oriented. No focal neurological deficits. ?Extremities: Decreased range of motion bilateral lower extremities ?Skin: No rashes,  lesions or ulcers ?Psychiatry: Judgement and insight appear normal. Mood & affect appropriate.  ? ? ? ?Data Reviewed: I have personally reviewed following labs and imaging studies ? ?CBC: ?Recent Labs  ?Lab 03/22/21 ?0300 03/23/21 ?7824 03/27/21 ?0834  ?WBC 8.4 11.5* 11.6*  ?NEUTROABS  --  9.6* 10.1*  ?HGB 9.4* 9.9* 8.4*  ?HCT 29.1* 31.2* 26.9*  ?MCV 82.2 84.1 85.1  ?PLT 245 297 319  ? ?Basic Metabolic Panel: ?Recent Labs  ?Lab 03/22/21 ?0300 03/23/21 ?2353 03/24/21 ?0259 03/27/21 ?0834  ?NA 132* 131*  --  128*  ?K 3.4* 3.8  --  4.5  ?CL 99 97*  --  97*  ?CO2 22 23  --  22  ?GLUCOSE 137* 153*  --  160*  ?BUN 15 19  --  22  ?CREATININE 0.59 0.76 0.72 0.76  ?CALCIUM 8.9 8.8*  --  8.5*   ?MG  --   --   --  2.6*  ? ?GFR: ?Estimated Creatinine Clearance: 94.2 mL/min (by C-G formula based on SCr of 0.76 mg/dL). ?Liver Function Tests: ?No results for input(s): AST, ALT, ALKPHOS, BILITOT, PROT, ALBUMIN in the last

## 2021-03-28 NOTE — Care Management (Signed)
Patient still experiencing some delirium.  Strongly suspect hospital-acquired delirium.  Laboratory and imaging investigation negative for acute issues.  Patient remained stable for discharge to skilled nursing facility.  Per [facility] insurance authorization expired 3/26.  It has been restarted today.  If we are able to obtain insurance authorization patient can discharge her status today. ? ?Ralene Muskrat MD ?

## 2021-03-29 ENCOUNTER — Ambulatory Visit: Payer: Medicare HMO

## 2021-03-29 DIAGNOSIS — B957 Other staphylococcus as the cause of diseases classified elsewhere: Secondary | ICD-10-CM | POA: Diagnosis not present

## 2021-03-29 DIAGNOSIS — R7881 Bacteremia: Secondary | ICD-10-CM | POA: Diagnosis not present

## 2021-03-29 DIAGNOSIS — R531 Weakness: Secondary | ICD-10-CM | POA: Diagnosis not present

## 2021-03-29 LAB — GLUCOSE, CAPILLARY
Glucose-Capillary: 131 mg/dL — ABNORMAL HIGH (ref 70–99)
Glucose-Capillary: 140 mg/dL — ABNORMAL HIGH (ref 70–99)
Glucose-Capillary: 130 mg/dL — ABNORMAL HIGH (ref 70–99)

## 2021-03-29 LAB — CK: Total CK: 33 U/L — ABNORMAL LOW (ref 38–234)

## 2021-03-29 MED ORDER — ALBUTEROL SULFATE (2.5 MG/3ML) 0.083% IN NEBU: 2.5000 mg | INHALATION_SOLUTION | Freq: Two times a day (BID) | RESPIRATORY_TRACT | Status: DC

## 2021-03-29 MED ORDER — MIRTAZAPINE 15 MG PO TABS: 15.0000 mg | ORAL_TABLET | Freq: Every day | ORAL | Status: AC

## 2021-03-29 MED ORDER — SODIUM CHLORIDE 1 G PO TABS: 1.0000 g | ORAL_TABLET | Freq: Two times a day (BID) | ORAL | Status: AC

## 2021-03-29 MED ORDER — SIMETHICONE 80 MG PO CHEW: 80.0000 mg | CHEWABLE_TABLET | Freq: Four times a day (QID) | ORAL | 0 refills | Status: AC

## 2021-03-29 MED ORDER — OXYCODONE HCL 5 MG PO TABS: 5.0000 mg | ORAL_TABLET | ORAL | 0 refills | Status: AC | PRN

## 2021-03-29 MED ORDER — DICLOFENAC SODIUM 1 % EX GEL: 4.0000 g | Freq: Four times a day (QID) | CUTANEOUS | Status: AC

## 2021-03-29 MED ORDER — METHOCARBAMOL 500 MG PO TABS: 500.0000 mg | ORAL_TABLET | Freq: Three times a day (TID) | ORAL | Status: AC | PRN

## 2021-03-29 MED ORDER — PANTOPRAZOLE SODIUM 40 MG PO TBEC: 40.0000 mg | DELAYED_RELEASE_TABLET | Freq: Every day | ORAL | Status: AC

## 2021-03-29 MED ORDER — ALPRAZOLAM 0.5 MG PO TABS: 0.2500 mg | ORAL_TABLET | Freq: Two times a day (BID) | ORAL | 0 refills | Status: AC | PRN

## 2021-03-29 MED ORDER — FAMOTIDINE 20 MG PO TABS: 20.0000 mg | ORAL_TABLET | Freq: Two times a day (BID) | ORAL | Status: AC

## 2021-03-29 MED ORDER — DAPTOMYCIN IV (FOR PTA / DISCHARGE USE ONLY): 700.0000 mg | INTRAVENOUS | 0 refills | Status: AC

## 2021-03-29 MED ORDER — SENNOSIDES-DOCUSATE SODIUM 8.6-50 MG PO TABS: 2.0000 | ORAL_TABLET | Freq: Every day | ORAL | Status: AC

## 2021-03-29 NOTE — TOC Progression Note (Signed)
Transition of Care (TOC) - Progression Note  ? ? ?Patient Details  ?Name: KRYSTALLE PILKINGTON ?MRN: [id number] ?Date of Birth: Aug 31, 1950 ? ?Transition of Care (TOC) CM/SW Contact  ?Olumide Dolinger A Bona Hubbard, LCSW ?Phone Number: ?03/29/2021, 1:44 PM ? ?Clinical Narrative:   Auth obtained. CSW has notified MD. MD to complete dc. Facility notified as well. ? ? ? ?  ?  ? ?Expected Discharge Plan and Services ?  ?  ?  ?  ?  ?                ?  ?  ?  ?  ?  ?  ?  ?  ?  ?  ? ? ?Social Determinants of Health (SDOH) Interventions ?  ? ?Readmission Risk Interventions ?   ? View : No data to display.  ?  ?  ?  ? ? ?

## 2021-03-29 NOTE — Progress Notes (Signed)
Pharmacy Antibiotic Note ? ?Selena Rodgers is a 71 y.o. female w/ PMH of arthritis and endometrial cancer (currently on Radiation therapy), T2DM, & HTNadmitted on 03/16/2021 with extreme fatigue >> c/f CAP + cellulitis of abdominal wall and newly discovered MRSE bacteremia.  Patient was on vancomycin, now Pharmacy has been consulted for daptomycin dosing. ? ?Today, 03/29/2021 ?Day #11 antibiotics ?CK = 33 ?Renal: SCr stable ?WBC stable ?Afebrile ?Abdominal wall culture: NGTD ?TEE was negative for endocarditis 3/23 ?Repeat blood cx from 3/19 remain NGTD ?Vancomycin levels on vancomycin 1gm IV q12h (dose 3/20 at 2203) ?Vanco peak 3/21 0030 = 28 ?Vanco trough 3/21 0853 = 10 ?Calculated AUC  =446.4 (goal 400-600) ?Cmax = 33.5, Cmin 8.7 ? Changed vancomycin to daptomycin 3/24 ? ?Plan:  ?Daptomycin '700mg'$  ('8mg'$ /kg per adjusted BW) q24h ?Monitor weekly CK ?Monitor renal function ?Plan discharge to SNF/Rehab - [facility] said they will take her on Daptomycin ?Daptomycin end date 04/01/2021 per ID ? ?Height: '5\' 7"'$  (170.2 cm) ?Weight: 129.3 kg (285 lb) ?IBW/kg (Calculated) : 61.6 ? ?Temp (24hrs), Avg:97.9 ?F (36.6 ?C), Min:97.7 ?F (36.5 ?C), Max:98.2 ?F (36.8 ?C) ? ?Recent Labs  ?Lab 03/23/21 ?0258 03/24/21 ?0259 03/27/21 ?0834  ?WBC 11.5*  --  11.6*  ?CREATININE 0.76 0.72 0.76  ? ?  ?Estimated Creatinine Clearance: 94.2 mL/min (by C-G formula based on SCr of 0.76 mg/dL).   ? ?Allergies  ?Allergen Reactions  ? Chlorpheniramine Anaphylaxis  ? Flaxseed (Linseed) Anaphylaxis  ? Codeine Hives  ? Flax Seed Oil [Bio-Flax] Swelling  ?  UNSPECIFIED   ? Latex Hives and Other (See Comments)  ?  BLISTERS  ? Tape Other (See Comments)  ?  BLISTERS  ? Tapentadol Other (See Comments)  ?  BLISTERS  ? Rosemary Oil Swelling  ?  Lip swelling  ? 2,4-D Dimethylamine Rash  ?  T-DAP  ? Aspirin Rash  ? ? ?Antimicrobials this admission: ?03/16 ceftriaxone >> 3/20 ?03/16 azithromycin >> 3/20 ?03/18 vancomycin >> 3/23 ?3/24 daptomycin  >> ? ?Microbiology results: ?03/15 BCx '@2330'$ : 2/2 MRSE ?03/16 Bcx @ 0630: 1/2 S epidermidis ?3/16 Bcx '@2220'$ : 3/4 S epidermidis ?3/19 Bcx NGTD ?3/22 abdominal wall cx: NG ?03/17 MRSA PCR: positive ? ?Thank you for allowing pharmacy to be a part of this patient?s care. ? ?Doreene Eland, PharmD, BCPS, BCIDP ?Work Cell: [phone] ?03/29/2021 1:11 PM ? ? ? ?

## 2021-03-29 NOTE — Care Management Important Message (Addendum)
Important Message ? ?Patient Details  ?Name: Selena Rodgers ?MRN: [id number] ?Date of Birth: [birth year] ? ? ?Medicare Important Message Given:  Yes ? ?I reviewed the Important Message from Medicare with the patient's HCPOA, Ayo Guarino, sister by phone [phone]) and she is in agreement with the discharge plan. I thanked her for her time. ? ?I asked if she would like a copy but she said it wasn't necessary.  ? ?Juliann Pulse A Claude Swendsen ?03/29/2021, 2:05 PM ?

## 2021-03-29 NOTE — TOC Transition Note (Signed)
Transition of Care (TOC) - CM/SW Discharge Note ? ? ?Patient Details  ?Name: TIEARA FLITTON ?MRN: [id number] ?Date of Birth: 04/06/1950 ? ?Transition of Care (TOC) CM/SW Contact:  ?Raphael Espe A Unita Detamore, LCSW ?Phone Number: ?03/29/2021, 2:17 PM ? ? ?Clinical Narrative:   Clinical Social Worker facilitated patient discharge including contacting patient family and facility to confirm patient discharge plans.  Clinical information faxed to facility and family agreeable with plan.  CSW arranged ambulance transport via [facility] to [facility] room 304B .  RN to call [phone] for report prior to discharge. ? ? ? ?Final next level of care: [place] ?Barriers to Discharge: No Barriers Identified ? ? ?Patient Goals and CMS Choice ?  ?  ?  ? ?Discharge Placement ?  ?           ?Patient chooses bed at:  ([facility]) ?Patient to be transferred to facility by: [facility] ?  ?Patient and family notified of of transfer: 03/29/21 ? ?Discharge Plan and Services ?  ?  ?           ?  ?  ?  ?  ?  ?  ?  ?  ?  ?  ? ?Social Determinants of Health (SDOH) Interventions ?  ? ? ?Readmission Risk Interventions ?   ? View : No data to display.  ?  ?  ?  ? ? ? ? ? ?

## 2021-03-29 NOTE — Progress Notes (Signed)
Patient is being discharged to [facility]. Tried to call report at 1750, Call transferred, no one answered. Will try again before patient is picked up by EMS ? ?

## 2021-03-29 NOTE — Progress Notes (Signed)
? ?Date of Admission:  03/16/2021    ? ?ID: Selena Rodgers is a 71 y.o. 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 ? ?Presented to [facility] ED on 03/16/21 with weakness and fall  ?In the ED temp 99.6, BP 135/66, HR 109 ?HB 9.7, WBC 8.4, Na 127, cr 0.77 ?BC- 03/16/21- MRSE ?3/16 BC- MRSE ?3/19 BC- NG ? ? ? ? ?Subjective: ?Doing much better ?Going to SNF ?Sister at bed side ?Still weak ? ?Medications:  ? albuterol  2.5 mg Nebulization BID  ? Chlorhexidine Gluconate Cloth  6 each Topical Daily  ? citalopram  20 mg Oral Daily  ? diclofenac Sodium  4 g Topical QID  ? famotidine  20 mg Oral BID  ? heparin  5,000 Units Subcutaneous Q8H  ? insulin aspart  0-9 Units Subcutaneous TID WC  ? insulin aspart  2 Units Subcutaneous TID WC  ? mirtazapine  15 mg Oral QHS  ? pantoprazole  40 mg Oral Daily  ? senna-docusate  2 tablet Oral QHS  ? simethicone  80 mg Oral QID  ? sodium chloride flush  10-40 mL Intracatheter Q12H  ? sodium chloride  1 g Oral BID WC  ? ? ?Objective: ?Vital signs in last 24 hours: ?Temp:  [97.7 ?F (36.5 ?C)-98.2 ?F (36.8 ?C)] 97.8 ?F (36.6 ?C) (03/28 1138) ?Pulse Rate:  [88-98] 88 (03/28 1138) ?Resp:  [18] 18 (03/28 1138) ?BP: (124-137)/(58-84) 124/61 (03/28 1138) ?SpO2:  [94 %-97 %] 96 % (03/28 1138) ? ?PHYSICAL EXAM:  ?General: awake and alert, no distress, pale ?Lungs: b/l air entry ?Heart: s1s2 ?Abdomen: Soft, large paraumbilical  hernia- cannot be reduced ?Superficial ulcers and wounds on the abdominal wall ? ? ? ? ?Extremities: atraumatic, no cyanosis. No edema. No clubbing ?Skin: No rashes or lesions. Or bruising ?Lymph: Cervical, supraclavicular normal. ?Neurologic: non focal ?Moves both lower extremities ? ?Lab Results ?Recent Labs  ?  03/27/21 ?0834  ?WBC 11.6*  ?HGB 8.4*  ?HCT 26.9*  ?NA 128*  ?K 4.5  ?CL 97*  ?CO2 22  ?BUN 22  ?CREATININE 0.76  ?Microbiology: ?[facility]- 03/16/21- MRSE ?3/16 BC- MRSE ?3/19 BC- NG ?Jan 9.2016 abdominal wound- MSSA ? ?Radiology ?CXR ? ?Personally reviewed- no infiltrate ?CT chest from 03/17/21 ? ?Patchy infiltrate left lingula ? ?Assessment/Plan: ? ?Staph epidermidis bacteremia- unclear source- could be the superficial ulcers from the abdominal wall ?She has lumbar and cervical fusion/foreign body . ?MRI of the lumbar spine does not show any infection ?TEE no endocarditis.-We will do a total of 2 weeks of IV antibiotic. On daptomycin  End date is 04/01/2021. ? ? ?Patient had  right hip pain which ahs resolved.  X-ray done was normal. Moving the leg well in bed  ? ?Skin ulcers over the paraumbilical pannus of the abdomen- culture sent ? ?Bronchopneumonia left lingula-flu/covid negative- treated with 5 days of Iv ceftriaxone and azithromycin ?CXR 3/23- showed left pleural effusion with possible volume loss-  ?Received  frusemide  ? ?Weakness -  ? ?Endometrial carcinoma- undergoing radiation ?Hysterectomy planned later ? ?Uterine bleed causing anemia  needing Blood transfusion and IV iron as OP ? ?Discussed the management with patient and her sister ?  ?OPAT Orders ?Discharge antibiotics: ?Daptomycin '700mg'$  IVPB every 24 hours  ?  ?End Date: ?04/01/21 ?  ?[facility] Care Per Protocol: ?  ?Labs weekly while on IV antibiotics: ?_X_ CBC with differential ?_ ?_X_ CMP ?X CPK ?  ?_X_ Please pull PIC at completion of IV antibiotics ?  ?Fax weekly labs to [phone] ?  ?Clinic Follow Up Appt: 04/05/21 at 11.45am ?  ?  ?

## 2021-03-29 NOTE — Discharge Summary (Signed)
Physician Discharge Summary  ?BLIMI GODBY DJS:[id number] DOB: Aug 11, 1950 DOA: 03/16/2021 ? ?PCP: [name], MD ? ?Admit date: 03/16/2021 ?Discharge date: 03/29/2021 ? ?Admitted From: Home ?Disposition:  SNF ? ?Recommendations for Outpatient Follow-up:  ?Follow up with PCP in 1-2 weeks ?Follow up with ID as directed ? ?Home Health:No ?Equipment/Devices:RUE PICC  ? ?Discharge Condition:Stable ?CODE STATUS:FULL  ?Diet recommendation: Regular ? ?Brief/Interim Summary: ?71 y.o. female with medical history significant of arthritis and endometrial cancer, dm II, HTN.  Patient is receiving radiation treatment at the cancer center.  She felt extreme fatigued at home.  Could not go for radiation treatment on the day of admission.  She also mentioned cough shortness of breath.  She was found to have hyponatremia.  CT scan raised concern for pneumonia.  She was hospitalized for further management.  There was also some concern for cellulitis in her left abdominal pannus.  Started on antibiotics.  Found to have Staph epidermidis bacteremia.  Initially thought to be contaminant but repeat blood cultures also showed similar organism.  ID was consulted.   ?  ?3/26: Sister concerned about some confusion noted from patient last night.  Patient states she is in the basement and seeing things.  Following morning patient is alert and oriented x4.  Suspect hospital-acquired delirium. ?  ?3/27: Insurance auth pending.  Previous authorization expired 3/26.  Authorization restarted 3/27.  Encephalopathy waxing and waning.  Work-up negative.  Suspect hospital-acquired delirium. ?3/28: Insurance authorization obtained.  Can transfer to SNF. ? ? ? ?Discharge Diagnoses:  ?Principal Problem: ?  CAP (community acquired pneumonia) ?Active Problems: ?  Cellulitis of abdominal wall ?  Bacteremia ?  Generalized weakness ?  Endometrial cancer ([place]) ?  Hyponatremia ?  Anemia ?  Diabetes ([place]) ?  Back pain ?  GERD (gastroesophageal reflux  disease) ?  Current tobacco use ?  Cellulitis ? ?Acute encephalopathy ?Strongly suspect hospital-acquired delirium ?Sister concerned as patient had endorsed that she was in the basement and seeing things ?This was noted on 3/20 5 PM ?On 3/20 6 AM patient is alert and oriented x4 ?Plan: ?Frequent reorienting measures ?Stable for discharge to skilled nursing facility ?Insurance authorization obtained ?  ?Bacteremia ?Staphylococcus epidermidis bacteremia ?BC ID did turn positive for Staph epidermidis.  Repeat set also reveals Staph epidermidis.  Patient started on vancomycin.   ?TEE negative on 3/23 ?ID following ?PICC line in place ?Transitioned to IV daptomycin ?ID outpatient antibiotic orders in place ?Plan: ?IV daptomycin per ID recommendations ?Will need 2-week course ?PICC line in place ?Last dose of antibiotic 3/31 ?  ?Cellulitis of abdominal wall ?Patient has maceration of the skin over the left abdomen/pannus.  Erythema was noted suggesting cellulitis.  No clear abscess. ?Seen by wound care.  Wound care dressing changes to be performed. ?Plan: ?Daptomycin as above ?Local wound care ?  ?CAP (community acquired pneumonia) ?Patient presented with generalized weakness cough shortness of breath.  Chest x-ray did not show any infiltrate but CT scan did raise concern for pneumonia.  ?Required oxygen briefly. ?Remains afebrile.  WBC is normal. ?Ceftriaxone and azithromycin for 5 days.   ?Respiratory status has improved.  Occasional cough.   ?Saturating normal on room air.  No further CAP coverage ?  ?Endometrial cancer ([place]) ?Has been receiving radiation treatment.  Supposed to end March 29.  ?Oncology was notified of admission.  Radiation treatments on hold.  ?Patient mentioned that she passed 2 small clots vaginally.  Hemoglobin stable. ?Plan: ?Radiation currently on hold  as patient will go to skilled nursing facility.  ? This has been discussed with outpatient radiation oncology ?  ?Generalized  weakness ?Hypokalemia ?  ?Likely due to dehydration, pneumonia, anemia.  Vital signs are stable.  Lactic acid level was normal.  CK was noted to be elevated at 819, improved to 709.  ?Needs a skilled nursing facility for rehabilitation per physical therapy. ?Intermitted K checks ?  ?Hyponatremia ?Previously normal.  Likely due to dehydration.  ?Sodium levels low but stable.   ?Urine osmolality is 970.  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  Urine sodium was 43.   ?Continue with fluid restriction and salt tablets.   ?  ?Diabetes ([place]) ?Monitor CBGs.  Continue SSI. ?  ?  ?Anemia ?Anemia likely multifactorial.  Previously has had acute on chronic blood loss anemia from vaginal bleeding.  She has required blood transfusion. ?Now there is some element of anemia of chronic disease.  Hemoglobin low but stable.  Continue to monitor.  ?  ?Back pain ?Patient with chronic history of back pain but has noticed worsening leg pain as well as leg weakness in the last few days.  Denies any falls in her back.  She is able to move her legs but has diminished strength.   ?She has had spinal fusion surgery previously.  [name] in [place] is her neurosurgeon. ?MRI of the lumbar spine was done on 3/18 which does show significant disease but no different from previous MRI which was done in 2021.  Patient and sister reassured.   ?-X-ray right hip negative for fracture or dislocation ?Plan: ?As needed pain control with Voltaren gel, appears effective ?  ?  ?GERD (gastroesophageal reflux disease) ?Vomiting ?Patient continues to endorse reflux of food into the back of the throat.  Was started on IV PPI 3/21.  Repeat CT scan reassuring.  Abdominal exam reassuring ?Plan: ?P.o. PPI ?P.o. H2 blocker ?P.o. simethicone ?As needed Mylanta ?As needed antiemetics ?  ?  ?Current tobacco use ?Nicotine patch. ?  ?  ?  ?Morbid obesity ?Estimated body mass index is 44.64 kg/m? as calculated from the following: ?  Height as of this encounter: '5\' 7"'$   (1.702 m). ?  Weight as of this encounter: 129.3 kg. ?This complicates overall care and prognosis ? ?Discharge Instructions ? ?Discharge Instructions   ? ? Diet - low sodium heart healthy   Complete by: As directed ?  ? Discharge wound care:   Complete by: As directed ?  ? Wound care  Every other day    ?Comments: Clean wounds over herniated area/pendulous area of the abdomen (LLQ) with saline, pat dry ?Cut to fit silver hydrofiber (Aquacel Ag+) and place over the open wounds, top with large foam dressing (to cover all the wounds) ?Change silver and foam dressings every other day  ? Home infusion instructions   Complete by: As directed ?  ? Instructions: Flushing of vascular access device: 0.9% NaCl pre/post medication administration and prn patency; Heparin 100 u/ml, 22m for implanted ports and Heparin 10u/ml, 553mfor all other central venous catheters.  ? Increase activity slowly   Complete by: As directed ?  ? ?  ? ?Allergies as of 03/29/2021   ? ?   Reactions  ? Chlorpheniramine Anaphylaxis  ? Flaxseed (linseed) Anaphylaxis  ? Codeine Hives  ? Flax Seed Oil [bio-flax] Swelling  ? UNSPECIFIED   ? Latex Hives, Other (See Comments)  ? BLISTERS  ? Tape Other (See Comments)  ? BLISTERS  ?  Tapentadol Other (See Comments)  ? BLISTERS  ? Rosemary Oil Swelling  ? Lip swelling  ? 2,4-d Dimethylamine Rash  ? T-DAP  ? Aspirin Rash  ? ?  ? ?  ?Medication List  ?  ? ?STOP taking these medications   ? ?megestrol 40 MG tablet ?Commonly known as: MEGACE ?  ? ?  ? ?TAKE these medications   ? ?acetaminophen 500 MG tablet ?Commonly known as: TYLENOL ?Take 2 tablets (1,000 mg total) by mouth 3 (three) times daily. ?  ?ALPRAZolam 0.5 MG tablet ?Commonly known as: Duanne Moron ?Take 0.5-1 tablets (0.25-0.5 mg total) by mouth 2 (two) times daily as needed for up to 3 days for anxiety. SNF use only.  Refills to be considered by SNF MD ?What changed: additional instructions ?  ?citalopram 20 MG tablet ?Commonly known as: CELEXA ?Take 20 mg by  mouth daily. ?  ?daptomycin  IVPB ?Commonly known as: CUBICIN ?Inject 700 mg into the vein daily for 6 days. Indication: MRSE bacteremia and abdominal wall wounds ?Last Day of Therapy:  04/01/2021 ?Labs - Once weekly:  CBC/D, BMP, and

## 2021-03-30 ENCOUNTER — Ambulatory Visit: Payer: Medicare HMO

## 2021-03-31 ENCOUNTER — Ambulatory Visit: Payer: Medicare HMO

## 2021-04-01 ENCOUNTER — Ambulatory Visit: Payer: Medicare HMO

## 2021-04-01 ENCOUNTER — Telehealth: Payer: Self-pay

## 2021-04-01 NOTE — Telephone Encounter (Signed)
Patient's sister Joycelyn Schmid called stating she would like to reschedule patient's appointment and would like to do a phone visit. Patient is in a SNF. Patient's sister will be present with her sister at the SNF the day of the appointment. I have rescheduled the patient to 04/12/21 ?

## 2021-04-04 ENCOUNTER — Ambulatory Visit: Payer: Medicare HMO

## 2021-04-04 NOTE — Anesthesia Postprocedure Evaluation (Signed)
Anesthesia Post Note ? ?Patient: Selena Rodgers ? ?Procedure(s) Performed: TRANSESOPHAGEAL ECHOCARDIOGRAM (TEE) ? ?Patient location during evaluation: PACU ?Anesthesia Type: General ?Level of consciousness: awake and alert ?Pain management: pain level controlled ?Vital Signs Assessment: post-procedure vital signs reviewed and stable ?Respiratory status: spontaneous breathing, nonlabored ventilation, respiratory function stable and patient connected to nasal cannula oxygen ?Cardiovascular status: blood pressure returned to baseline and stable ?Postop Assessment: no apparent nausea or vomiting ?Anesthetic complications: no ? ? ?No notable events documented. ? ? ?Last Vitals:  ?Vitals:  ? 03/29/21 0754 03/29/21 1138  ?BP: (!) 126/58 124/61  ?Pulse: 91 88  ?Resp: 18 18  ?Temp: 36.6 ?C 36.6 ?C  ?SpO2: 97% 96%  ?  ?Last Pain:  ?Vitals:  ? 03/29/21 1120  ?TempSrc:   ?PainSc: Asleep  ? ? ?  ?  ?  ?  ?  ?  ? ?Molli Barrows ? ? ? ? ?

## 2021-04-05 ENCOUNTER — Inpatient Hospital Stay: Payer: Medicare HMO | Admitting: Infectious Diseases

## 2021-04-05 ENCOUNTER — Ambulatory Visit: Payer: Medicare HMO

## 2021-04-06 ENCOUNTER — Ambulatory Visit: Payer: Medicare HMO

## 2021-04-07 ENCOUNTER — Ambulatory Visit: Payer: Medicare HMO

## 2021-04-08 ENCOUNTER — Ambulatory Visit: Payer: Medicare HMO

## 2021-04-11 ENCOUNTER — Ambulatory Visit: Payer: Medicare HMO

## 2021-04-11 ENCOUNTER — Telehealth: Payer: Self-pay

## 2021-04-11 NOTE — Telephone Encounter (Signed)
We received a voicemail from patient's sister on 04/08/21 regarding patient's picc line being removed. Patient's sister stated that the SNF ([facility]) called stating they did not have order to remove picc on 04/01/21.  ? ?I have attempted to reach out to the facility x 2 and I have not had been able to get in contact with a nurse and give them orders to remove the picc. ? ?I have also spoke to the patient sister just to follow up with her and she stated patient's picc was accidentally pulled out this morning when the nurse was removing the patient's gown ?

## 2021-04-12 ENCOUNTER — Encounter: Payer: Self-pay | Admitting: Infectious Diseases

## 2021-04-12 ENCOUNTER — Ambulatory Visit: Payer: Medicare HMO | Attending: Infectious Diseases | Admitting: Infectious Diseases

## 2021-04-12 DIAGNOSIS — K429 Umbilical hernia without obstruction or gangrene: Secondary | ICD-10-CM | POA: Diagnosis not present

## 2021-04-12 DIAGNOSIS — I10 Essential (primary) hypertension: Secondary | ICD-10-CM | POA: Diagnosis not present

## 2021-04-12 DIAGNOSIS — B372 Candidiasis of skin and nail: Secondary | ICD-10-CM | POA: Insufficient documentation

## 2021-04-12 DIAGNOSIS — L304 Erythema intertrigo: Secondary | ICD-10-CM | POA: Diagnosis not present

## 2021-04-12 DIAGNOSIS — D070 Carcinoma in situ of endometrium: Secondary | ICD-10-CM | POA: Diagnosis not present

## 2021-04-12 DIAGNOSIS — Z09 Encounter for follow-up examination after completed treatment for conditions other than malignant neoplasm: Secondary | ICD-10-CM | POA: Diagnosis present

## 2021-04-12 DIAGNOSIS — E119 Type 2 diabetes mellitus without complications: Secondary | ICD-10-CM | POA: Diagnosis not present

## 2021-04-12 DIAGNOSIS — L0390 Cellulitis, unspecified: Secondary | ICD-10-CM

## 2021-04-12 DIAGNOSIS — Z8619 Personal history of other infectious and parasitic diseases: Secondary | ICD-10-CM | POA: Insufficient documentation

## 2021-04-12 DIAGNOSIS — D649 Anemia, unspecified: Secondary | ICD-10-CM | POA: Diagnosis not present

## 2021-04-12 MED ORDER — FLUCONAZOLE 100 MG PO TABS: 100.0000 mg | ORAL_TABLET | Freq: Every day | ORAL | 0 refills | Status: DC

## 2021-04-12 NOTE — Progress Notes (Signed)
The purpose of this virtual visit is to provide medical care while limiting exposure to the novel coronavirus (COVID19) for both patient and office staff. ?  ?Consent was obtained for phone visit:  Yes.   ?Answered questions that patient had about telehealth interaction:  Yes.   ?I discussed the limitations, risks, security and privacy concerns of performing an evaluation and management service by telephone. I also discussed with the patient that there may be a patient responsible charge related to this service. The patient expressed understanding and agreed to proceed. ?  ?Patient Location: SNF ?Provider Location: office. ?People on the call: patient, her sister Selena Rodgers, [place] and Provider ? Pt is in SNF following her discharge from hospital.  She was in the hospital 03/16/2021 until 03/29/2021.  She has a history of endometrial carcinoma diabetes mellitusAnd hypertension.  She was getting radiation treatment.  She felt extremely fatigued at home.  And she could not go to the radiation treatment.  She was also having cough and shortness of breath.  Hence she came to the hospital.  There was concern for cellulitis in the abdominal pannus.  There was a superficial wound.  She was found to have Staph epidermidis bacteremia.  Initially it was thought to be contaminant with repeat cultures also showed the same organism.  TEE was done and showed no endocarditis.  Patient had some hospital-acquired delirium.  She had some waxing waning encephalopathy.  Which all resolved on the day of discharge the plan was to give her a total of 2 weeks of IV antibiotics.  She was placed on daptomycin on discharge.  She was discharged on 03/29/2021 to the SNF.  She completed IV antibiotic on 04/01/2021.  The PICC line was not removed until 04/11/2021 when it accidentally fell off while they were changing her clothing. ?Patient states she is feeling better ?Yesterday was the first day she walked 50 feet ?She has no fever or cough or shortness  of breath ?She had some diarrhea while on antibiotic which has resolved now completely ?She is not sure how her abdominal wounds are looking ?Her sister was planning to take pictures and send it through [place] ? ?04/12/21 ? ? ?03/23/21 ? ? ?Impression/recommendation ?Staph epidermidis bacteremia completed treatment ?Superficial wounds on the abdominal wall.  On reviewing the pictures that were sent today it looks worse.  There is extensive intertrigo/Candida.  will give fluconazole 200 mg once a day for 7 days. ? ?History of anemia and has received blood transfusion as well as IV iron ? ?Endometrial carcinoma.  Radiation has been on hold since she went to SNF. ? ?Following radiation she is going to be getting hysterectomy ? ?She has a huge paraumbilical hernia.  This will also be repaired while she is getting the hysterectomy. ? ?Patient after discharge will need to get hooked to the wound clinic. ?We will inform her PCP for a referral. ? ?Discussed the management in detail with patient and her sister ?Total time spent 20 min for the call, coordination with staff  ? ?

## 2021-04-12 NOTE — Addendum Note (Signed)
Addended by: Eugenia Mcalpine on: 04/12/2021 12:37 PM ? ? Modules accepted: Orders ? ?

## 2021-04-12 NOTE — Telephone Encounter (Signed)
Spoke with SNF nurse Lavell Anchors RN ? ?Advised to start fluconazole '200mg'$  once a day for the yeast skin infection for 7 days. ? ?Follow up with wound clinic at [facility] once discharged from SNF. ? ?Verbal orders read back and understood.  ? ?Family also updated on new orders via [place].  ?Eugenia Mcalpine ?  ?

## 2021-04-18 ENCOUNTER — Encounter: Payer: Self-pay | Admitting: Infectious Diseases

## 2021-04-27 ENCOUNTER — Encounter: Payer: Self-pay | Admitting: Nurse Practitioner

## 2021-05-25 ENCOUNTER — Ambulatory Visit: Payer: Medicare HMO

## 2021-06-08 ENCOUNTER — Ambulatory Visit: Payer: Medicare HMO

## 2021-06-08 ENCOUNTER — Telehealth: Payer: Self-pay

## 2021-06-08 NOTE — Telephone Encounter (Signed)
Received call from sister, Joycelyn Schmid. She feels Ms. Krienke will be able to come to clinic in a couple of weeks. Appointment made to see Dr. Fransisca Connors 6/21.

## 2021-06-22 ENCOUNTER — Encounter: Payer: Self-pay | Admitting: Obstetrics and Gynecology

## 2021-06-22 ENCOUNTER — Inpatient Hospital Stay: Payer: Medicare HMO | Attending: Obstetrics and Gynecology | Admitting: Obstetrics and Gynecology

## 2021-06-22 VITALS — BP 125/64 | HR 84 | Temp 98.7°F | Resp 20 | Wt 266.6 lb

## 2021-06-22 DIAGNOSIS — M4712 Other spondylosis with myelopathy, cervical region: Secondary | ICD-10-CM | POA: Insufficient documentation

## 2021-06-22 DIAGNOSIS — Z923 Personal history of irradiation: Secondary | ICD-10-CM | POA: Diagnosis not present

## 2021-06-22 DIAGNOSIS — K219 Gastro-esophageal reflux disease without esophagitis: Secondary | ICD-10-CM | POA: Diagnosis not present

## 2021-06-22 DIAGNOSIS — K449 Diaphragmatic hernia without obstruction or gangrene: Secondary | ICD-10-CM | POA: Diagnosis not present

## 2021-06-22 DIAGNOSIS — M4180 Other forms of scoliosis, site unspecified: Secondary | ICD-10-CM | POA: Diagnosis not present

## 2021-06-22 DIAGNOSIS — Z6841 Body Mass Index (BMI) 40.0 and over, adult: Secondary | ICD-10-CM | POA: Insufficient documentation

## 2021-06-22 DIAGNOSIS — Z981 Arthrodesis status: Secondary | ICD-10-CM | POA: Diagnosis not present

## 2021-06-22 DIAGNOSIS — M4316 Spondylolisthesis, lumbar region: Secondary | ICD-10-CM | POA: Diagnosis not present

## 2021-06-22 DIAGNOSIS — M549 Dorsalgia, unspecified: Secondary | ICD-10-CM | POA: Diagnosis not present

## 2021-06-22 DIAGNOSIS — Z79899 Other long term (current) drug therapy: Secondary | ICD-10-CM | POA: Diagnosis not present

## 2021-06-22 DIAGNOSIS — E119 Type 2 diabetes mellitus without complications: Secondary | ICD-10-CM | POA: Diagnosis not present

## 2021-06-22 DIAGNOSIS — C541 Malignant neoplasm of endometrium: Secondary | ICD-10-CM

## 2021-06-22 DIAGNOSIS — E7800 Pure hypercholesterolemia, unspecified: Secondary | ICD-10-CM | POA: Diagnosis not present

## 2021-06-22 DIAGNOSIS — Z7984 Long term (current) use of oral hypoglycemic drugs: Secondary | ICD-10-CM | POA: Insufficient documentation

## 2021-06-22 DIAGNOSIS — G8929 Other chronic pain: Secondary | ICD-10-CM | POA: Insufficient documentation

## 2021-06-22 DIAGNOSIS — F17210 Nicotine dependence, cigarettes, uncomplicated: Secondary | ICD-10-CM | POA: Insufficient documentation

## 2021-06-22 DIAGNOSIS — Z79818 Long term (current) use of other agents affecting estrogen receptors and estrogen levels: Secondary | ICD-10-CM | POA: Diagnosis not present

## 2021-06-22 DIAGNOSIS — Z791 Long term (current) use of non-steroidal anti-inflammatories (NSAID): Secondary | ICD-10-CM | POA: Diagnosis not present

## 2021-06-22 DIAGNOSIS — M1990 Unspecified osteoarthritis, unspecified site: Secondary | ICD-10-CM | POA: Diagnosis not present

## 2021-06-22 NOTE — Progress Notes (Signed)
Gynecologic Oncology Consult Visit   Referring Provider: Dr Leafy Ro  Chief Concern: endometrial cancer, grade 1, menorrhagia  Subjective:  Selena Rodgers is a 71 y.o. G36 female who is seen in consultation from Dr. Leafy Ro for endometrial cancer s/p palliative radiation for bleeding, who presents to clinic for treatment planning.   We previously discussed options for management including surgery, radiation, hormonal therapy.  She has a very large recurrent hernia in the lower abdomen that creates a challenge for hysterectomy however, this is her personal preference and expresses desire to have hernia revision as part of joint procedure. Additionally, patient is obese with nulliparous pelvis.   Pelvic MRI 02/16/21 1. Signs of diffuse endometrial cancer within the uterus up to 90% of the myometrial thickness is involved. Tumor appears to extend to serosa in the RIGHT parametrium. Potential nodular area extending beyond the uterine fundus as well though on sagittal post-contrast images this cannot be connected to subjacent tumor within the uterus in appears separated from tumor by myometrium. 2. Indeterminate area in the RIGHT pelvis follows broad ligament and may represent an atrophic ovary. Given above findings PET scan may be helpful for complete staging.  3. Large hiatal hernia with 13 cm rectus separation above the symphysis pubis.   PET - 02/23/21 IMPRESSION: 1. Mild limitations secondary to patient body habitus. 2. Hypermetabolic uterine primary. 3. Mildly hypermetabolic abdominal retroperitoneal and pelvic nodes.The inguinal nodes are likely reactive and are typical in the setting of obesity. The right external iliac and abdominal retroperitoneal nodes are equivocal, mildly suspicious for metastatic disease. 4. No supradiaphragmatic metastatic disease identified. 5. Incidental findings, including: Coronary artery atherosclerosis. Aortic Atherosclerosis (ICD10-I70.0).  Prior to  completing radiation, 3 treatments remained of about 15, she developed pneumonia and was hospitalized. During hospitalization she suffered delirium and developed infection of skin of pannus. She was discharged to skilled nursing. She transferred to home mid-April and is now back to baseline in regards to physicality. She has lost 17lbs. Now lives alone. Uses a wheelchair for arthritis of lower extremities. Feels well and denies other complaints. She continues megace and denies bleeding since March.   Gynecologic Oncology History:  Seen for PMB in 11/22 by Dr Leafy Ro.  Prior endometrial ablation in 2000.   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  Took some Megace, but bleeding got worse, so stopped.  Bad back limits her mobility. Chronic pain.   She has gigantic lower abdominal hernia s/p prior unsuccessful repair with mesh due to incarcerated bowel.    Problem List: Patient Active Problem List   Diagnosis Date Noted   Bacteremia 03/18/2021   Hyponatremia 03/17/2021   Anemia 03/17/2021   Diabetes ([place]) 03/17/2021   GERD (gastroesophageal reflux disease) 03/17/2021   CAP (community acquired pneumonia) 03/17/2021   Cellulitis of abdominal wall 03/17/2021   Cellulitis 03/17/2021   Endometrial cancer ([place]) 02/09/2021   Post-menopausal bleeding 02/09/2021   Acquired scoliosis 11/11/2020   Arthrodesis status 10/19/2020   History of lumbar fusion 04/13/2020   Other forms of scoliosis, site unspecified 08/29/2019   Body mass index (BMI) 40.0-44.9, adult ([place]) 08/29/2019   Lip swelling 07/28/2019   Spondylolisthesis, lumbar region 06/25/2019   Neck pain 02/25/2016   History of excision of lamina of cervical vertebra for decompression of spinal cord 06/22/2015   Adjustment disorder with mixed anxiety and depressed mood    Slow transit constipation    Myelopathy ([place]) 04/21/2015   Cervical myelopathy ([facility])    Constipation due  to pain medication     Depression    Primary osteoarthritis of right shoulder    S/P spinal surgery    Abnormality of gait    Generalized weakness    Other secondary hypertension    Post-operative pain    Arthritis    Cervical spondylosis with myelopathy 04/19/2015   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   Ventral hernia 10/07/2014   Current tobacco use 10/06/2014   Anxiety 09/17/2014   Back pain 09/17/2014   Clinical depression 09/17/2014   BP (high blood pressure) 09/17/2014   Borderline diabetes 09/17/2014   Arthritis, degenerative 09/17/2014   Spinal stenosis 09/17/2014   Compulsive tobacco user syndrome 09/17/2014   Abdominal mass 06/30/2014   Incisional hernia, without obstruction or gangrene 06/30/2014   Morbid obesity ([place]) 06/30/2014   Hypercholesteremia 05/23/2013   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   Hypercholesterolemia 05/23/2013   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) 05/23/2013   Past Medical History: Past Medical History:  Diagnosis Date   Anxiety    Arthritis    Chronic back pain    Diabetes ([place]) 03/17/2021   Endometrial cancer ([place]) 02/09/2021   GERD (gastroesophageal reflux disease)    Hypertension    Post-menopausal bleeding    Spinal stenosis    Umbilical hernia    Uterine cancer ([place])    Ventral hernia     Past Surgical History: Past Surgical History:  Procedure Laterality Date   ANTERIOR CERVICAL DECOMP/DISCECTOMY FUSION N/A 04/19/2015   Procedure: ANTERIOR CERVICAL DECOMPRESSION/DISCECTOMY FUSION INTERBODY PROTHESIS PLATING BONEGRAFT CERVICAL THREE-FOUR ,CERVICAL FOUR-FIVE;  Surgeon: Newman Pies, MD;  Location: [facility];  Service: Neurosurgery;  Laterality: N/A;   BREAST BIOPSY     CARPAL TUNNEL RELEASE     CHOLECYSTECTOMY     FOOT SURGERY Right    IRRIGATION AND DEBRIDEMENT ABDOMEN  01/10/2014   Dr. Marina Gravel   TEE WITHOUT CARDIOVERSION N/A 03/24/2021   Procedure: TRANSESOPHAGEAL ECHOCARDIOGRAM (TEE);  Surgeon: Minna Merritts, MD;  Location: [facility];  Service: Cardiovascular;  Laterality: N/A;   [place]  12/26/2013   Dr. Pat Patrick   WOUND EXPLORATION N/A 07/14/2019   Procedure: LUMBAR EXPLORATION EVACUATION OF HEMATOMA;  Surgeon: Newman Pies, MD;  Location: [facility];  Service: Neurosurgery;  Laterality: N/A;     OB History:  OB History  No obstetric history on file.    Family History: Family History  Problem Relation Age of Onset   Diabetes Maternal Grandmother    Diabetes Maternal Grandfather    Diabetes Paternal Grandmother    Diabetes Paternal Grandfather    Hypertension Mother     Social History: Social History   Socioeconomic History   Marital status: Single    Spouse name: Not on file   Number of children: Not on file   Years of education: Not on file   Highest education level: Not on file  Occupational History   Not on file  Tobacco Use   Smoking status: Some Days   Smokeless tobacco: Never   Tobacco comments:    5 cigs daily  Vaping Use   Vaping Use: Never used  Substance and Sexual Activity   Alcohol use: No    Alcohol/week: 0.0 standard drinks of alcohol   Drug use: No   Sexual activity: Not Currently    Birth control/protection: None  Other Topics Concern   Not on file  Social History Narrative   Not on file   Social Determinants  of Health   Financial Resource Strain: Not on file  Food Insecurity: Not on file  Transportation Needs: Not on file  Physical Activity: Not on file  Stress: Not on file  Social Connections: Not on file  Intimate Partner Violence: Not on file    Allergies: Allergies  Allergen Reactions   Chlorpheniramine Anaphylaxis   Flaxseed (Linseed) Anaphylaxis   Codeine Hives   Flax Seed Oil [Flax Seed Oil] Swelling    UNSPECIFIED    Latex Hives and Other (See Comments)    BLISTERS   Tape Other (See Comments)    BLISTERS   Tapentadol Other (See Comments)    BLISTERS   Rosemary Oil Swelling    Lip swelling   2,4-D  Dimethylamine Rash    T-DAP   Aspirin Rash    Current Medications: Current Outpatient Medications  Medication Sig Dispense Refill   acetaminophen (TYLENOL) 500 MG tablet Take 2 tablets (1,000 mg total) by mouth 3 (three) times daily. 30 tablet 0   ALPRAZolam (XANAX) 0.25 MG tablet Take by mouth 2 (two) times daily.     citalopram (CELEXA) 20 MG tablet Take 20 mg by mouth daily.     diclofenac Sodium (VOLTAREN) 1 % GEL Apply 4 g topically 4 (four) times daily.     famotidine (PEPCID) 20 MG tablet Take 1 tablet (20 mg total) by mouth 2 (two) times daily.     FIBER-LAX 625 MG tablet Take by mouth.     fluconazole (DIFLUCAN) 100 MG tablet Take 1 tablet (100 mg total) by mouth daily. 7 tablet 0   megestrol (MEGACE) 40 MG tablet Take 40 mg by mouth daily.     metFORMIN (GLUCOPHAGE) 500 MG tablet Take 500 mg by mouth 2 (two) times daily.     omeprazole (PRILOSEC) 40 MG capsule Take 40 mg by mouth daily.      simethicone (MYLICON) 80 MG chewable tablet Chew 1 tablet (80 mg total) by mouth 4 (four) times daily. 30 tablet 0   vitamin B-12 (CYANOCOBALAMIN) 1000 MCG tablet Take 1,000 mcg by mouth daily.     meloxicam (MOBIC) 15 MG tablet Take 15 mg by mouth daily. (Patient not taking: Reported on 06/22/2021)     methocarbamol (ROBAXIN) 500 MG tablet Take 1 tablet (500 mg total) by mouth every 8 (eight) hours as needed for muscle spasms. (Patient not taking: Reported on 06/22/2021)     mirtazapine (REMERON) 15 MG tablet Take 1 tablet (15 mg total) by mouth at bedtime. (Patient not taking: Reported on 06/22/2021)     pantoprazole (PROTONIX) 40 MG tablet Take 1 tablet (40 mg total) by mouth daily. (Patient not taking: Reported on 06/22/2021)     senna-docusate (SENOKOT-S) 8.6-50 MG tablet Take 2 tablets by mouth at bedtime. (Patient not taking: Reported on 06/22/2021)     sodium chloride 1 g tablet Take 1 tablet (1 g total) by mouth 2 (two) times daily with a meal. (Patient not taking: Reported on 06/22/2021)      No current facility-administered medications for this visit.   Review of Systems General:  no complaints Skin: no complaints Eyes: no complaints HEENT: no complaints Breasts: no complaints Pulmonary: no complaints Cardiac: no complaints Gastrointestinal: no complaints Genitourinary/Sexual: no complaints Ob/Gyn: no complaints Musculoskeletal: no complaints Hematology: no complaints Neurologic/Psych: no complaints   Objective:  Physical Examination:  BP 125/64   Pulse 84   Temp 98.7 F (37.1 C)   Resp 20   Wt 266 lb 9.6 oz (  120.9 kg)   SpO2 100%   BMI 41.76 kg/m    ECOG Performance Status: 1 - Symptomatic but completely ambulatory  General appearance: alert, cooperative, and appears stated age HEENT:PERRLA and neck supple with midline trachea Lymph node survey: non-palpable, axillary, inguinal, supraclavicular Cardiovascular: regular rate and rhythm, no murmurs or gallops Respiratory: normal air entry, lungs clear to auscultation and no rales, rhonchi or wheezing Abdomen: soft, massive hernia in lower abdomen. Back: inspection of back is normal Extremities: extremities normal, atraumatic, no cyanosis or edema Skin exam - normal coloration and turgor, no rashes, no suspicious skin lesions noted. Neurological exam reveals alert, oriented, normal speech, no focal findings or movement disorder noted.  Pelvic from last visit: exam chaperoned by nurse, EGBUS within normal limits, normal vagina and vulva;  Vulva: normal appearing vulva with no masses, tenderness or lesions; Vagina: normal vagina; Adnexa: normal adnexa in size, nontender and no masses; Uterus: uterus is normal size, shape, consistency and nontender; Cervix: anteverted; Rectal: not indicated.  Lab Results No labs on site today. Lab Results  Component Value Date   WBC 11.6 (H) 03/27/2021   HGB 8.4 (L) 03/27/2021   HCT 26.9 (L) 03/27/2021   MCV 85.1 03/27/2021   PLT 319 03/27/2021     Chemistry       Component Value Date/Time   NA 128 (L) 03/27/2021 0834   NA 137 01/10/2014 0416   K 4.5 03/27/2021 0834   K 3.5 01/10/2014 0416   CL 97 (L) 03/27/2021 0834   CL 106 01/10/2014 0416   CO2 22 03/27/2021 0834   CO2 26 01/10/2014 0416   BUN 22 03/27/2021 0834   BUN 6 (L) 01/10/2014 0416   CREATININE 0.76 03/27/2021 0834   CREATININE 0.80 01/10/2014 0416      Component Value Date/Time   CALCIUM 8.5 (L) 03/27/2021 0834   CALCIUM 8.3 (L) 01/10/2014 0416   ALKPHOS 42 03/17/2021 0416   ALKPHOS 65 12/28/2013 0500   AST 30 03/17/2021 0416   AST 22 12/28/2013 0500   ALT 13 03/17/2021 0416   ALT 16 12/28/2013 0500   BILITOT 0.5 03/17/2021 0416   BILITOT 0.4 12/28/2013 0500      Assessment:  Selena Rodgers is a 71 y.o. G40 female diagnosed with grade 1 endometrial adenocarcinoma.  She has a large ventral hernia with prior failed mesh repair and relatively poor PS. In view of this we were considering all possible option for treatment, including non-surgical approaches with radiation and hormones (eg. IUD).  MRI 2/23 showed signs of diffuse endometrial cancer within the uterus up to 90% of the myometrial thickness is involved. Tumor appears to extend to serosa in the RIGHT parametrium. Potential nodular area extending beyond the uterine fundus as well. PET scan showed hypermetabolic uterine primary.  Mildly hypermetabolic abdominal retroperitoneal and pelvic nodes. The right external iliac and abdominal retroperitoneal nodes are equivocal, mildly suspicious for metastatic disease.   Elected to start treatment with radiation 3/23 in view of concern for locally advanced disease.  She completed about 12 of 15 planned treatments and developed pneumonia and was hospitalized. During hospitalization she suffered delirium and developed infection of skin of pannus. She was discharged to skilled nursing. She transferred to home mid-April and is now back to baseline and has lost 17 lbs. Uses a wheelchair  for arthritis of lower extremities. She continues megace 40 mg qd for hormonal therapy of endometrial cancer and denies bleeding since March. She would like to have surgery  to remove the cancer and also repair the large ventral hernia.   MMR IHC shows intact expression.  QNS for MSI testing.   Medical co-morbidities complicating care: Massive abdominal hernia, AODM, morbid obesity (BMI 41), chronic back pain.  Plan:   Problem List Items Addressed This Visit       Genitourinary   Endometrial cancer ([place]) - Primary   Relevant Medications   megestrol (MEGACE) 40 MG tablet   We again discussed options for management including surgery, radiation and hormonal therapy.  She has a very large recurrent hernia in the lower abdomen that would make hysterectomy difficult with significant risk of postop wound and other complications.  In addition, local extension of disease and possible nodal mets are problematic wrt curative surgery. We may want to just continue with hormonal therapy with Megace until there are signs of disease progression.    Will have her see Dr Chrisandra Carota in [facility] to determine whether he thinks surgical repair of the hernia is achievable in view of her less than optimal PS and prior failure of mesh repair with morbid obesity and recent radiation. Based on Dr Derryl Harbor assessment we will plan further endometrial cancer therapy.  This could also include further radiation as well as carboplatin/taxol chemotherapy, but would reserve the latter for if there is evidence of disease progression.     The patient's diagnosis, an outline of the further diagnostic and laboratory studies which will be required, the recommendation, and alternatives were discussed.  All questions were answered to the patient's satisfaction.  Will see her back after consultation with Dr Chrisandra Carota to determine treatment plan.  Verlon Au, NP  I personally interviewed and examined the patient. Agreed with  the above/below plan of care. I have directly contributed to assessment and plan of care of this patient and educated and discussed with patient and family.  Mellody Drown, MD  CC:  [name], MD [place],  [place] [phone]

## 2021-06-27 ENCOUNTER — Encounter: Payer: Self-pay | Admitting: Nurse Practitioner

## 2021-06-27 ENCOUNTER — Telehealth: Payer: Self-pay | Admitting: Nurse Practitioner

## 2021-07-06 ENCOUNTER — Encounter: Payer: Self-pay | Admitting: Nurse Practitioner

## 2021-07-06 DIAGNOSIS — K439 Ventral hernia without obstruction or gangrene: Secondary | ICD-10-CM | POA: Insufficient documentation

## 2021-07-11 ENCOUNTER — Encounter: Payer: Self-pay | Admitting: Nurse Practitioner

## 2021-07-13 ENCOUNTER — Other Ambulatory Visit: Payer: Self-pay | Admitting: Nurse Practitioner

## 2021-07-13 DIAGNOSIS — C541 Malignant neoplasm of endometrium: Secondary | ICD-10-CM

## 2021-07-13 MED ORDER — MEGESTROL ACETATE 40 MG PO TABS: 40.0000 mg | ORAL_TABLET | Freq: Two times a day (BID) | ORAL | 2 refills | Status: DC

## 2021-07-13 NOTE — Progress Notes (Signed)
Reviewed case with Dr. Fransisca Connors. Surgery was deemed high risk per plastic surgery. Dr. Fransisca Connors agrees. He recommends increasing megace to 40 mg twice a day in addition to radiation. She has previously received short course of radiation and low dose hormonal therapy to control her symptoms but now will receive treatment dosing. Dr. Fransisca Connors spoke to Dr. Baruch Gouty by phone who was in agreement. Dr. Fransisca Connors spoke to [facility] by phone who was in agreement. New prescription for megace sent to pharmacy. Message sent to radiation to coordinate appointment with Dr. Baruch Gouty.

## 2021-07-28 ENCOUNTER — Encounter: Payer: Self-pay | Admitting: Radiation Oncology

## 2021-07-28 ENCOUNTER — Ambulatory Visit
Admission: RE | Admit: 2021-07-28 | Discharge: 2021-07-28 | Disposition: A | Discharge status: Home / Self Care | Payer: Medicare HMO | Source: Ambulatory Visit | Attending: Radiation Oncology | Admitting: Radiation Oncology

## 2021-07-28 VITALS — BP 153/81 | HR 85 | Temp 99.3°F | Resp 20

## 2021-07-28 DIAGNOSIS — C541 Malignant neoplasm of endometrium: Secondary | ICD-10-CM | POA: Diagnosis not present

## 2021-07-28 NOTE — Progress Notes (Signed)
Radiation Oncology Follow up Note  Name: Selena Rodgers   Date:   07/28/2021 MRN:  [id number] DOB: 06/22/1950    This 71 y.o. 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  REFERRING PROVIDER: [name], MD  HPI: Patient is a 71 year old female who is now out 4 months since her treatments been interrupted for hospitalization for diffuse endometrial carcinoma of the uterus with up to 90% myometrial thickness involvement..  She received 25 Gray of a prescription for 45 Gray to her whole pelvis.  She developed pneumonia was hospitalized and was in skilled nursing.  She has been seen again by surgery and again based on her comorbidities morbid obesity she was been declined for surgery.  I discussed the case with GYN oncology she is currently on 40 mg daily of Megace.  She is fairly asymptomatic specifically denies any increased lower urinary tract symptoms diarrhea.  She has been having no vaginal bleeding.  She is seen today for contemplation to resume her treatments.  COMPLICATIONS OF TREATMENT: none  FOLLOW UP COMPLIANCE: keeps appointments   PHYSICAL EXAM:  BP (!) 153/81 (BP Location: Right Wrist, Patient Position: Sitting, Cuff Size: Normal)   Pulse 85   Temp 99.3 F (37.4 C) (Tympanic)   Resp 20  Obese female wheelchair-bound slightly frail otherwise in NAD.  Well-developed well-nourished patient in NAD. HEENT reveals PERLA, EOMI, discs not visualized.  Oral cavity is clear. No oral mucosal lesions are identified. Neck is clear without evidence of cervical or supraclavicular adenopathy. Lungs are clear to A&P. Cardiac examination is essentially unremarkable with regular rate and rhythm without murmur rub or thrill. Abdomen is benign with no organomegaly or masses noted. Motor sensory and DTR levels are  equal and symmetric in the upper and lower extremities. Cranial nerves II through XII are grossly intact. Proprioception is intact. No peripheral adenopathy or edema is identified. No motor or sensory levels are noted. Crude visual fields are within normal range.  RADIOLOGY RESULTS: Previous PET scan reviewed.  PLAN: At this time like to go ahead with an additional 2520 cGy to her whole pelvis.  Again risk and benefits of treatment occluding increased lower Neri tract symptoms diarrhea fatigue alteration of blood counts all were reviewed with the patient.  Patient comprehends her recommendations well if personally set up and ordered CT simulation for next week.  I would like to take this opportunity to thank you for allowing me to participate in the care of your patient.Noreene Filbert, MD

## 2021-08-02 ENCOUNTER — Ambulatory Visit: Payer: Medicare HMO

## 2021-08-04 ENCOUNTER — Ambulatory Visit: Payer: Medicare HMO

## 2021-08-10 ENCOUNTER — Ambulatory Visit
Admission: RE | Admit: 2021-08-10 | Discharge: 2021-08-10 | Disposition: A | Discharge status: Home / Self Care | Payer: Medicare HMO | Source: Ambulatory Visit | Attending: Radiation Oncology | Admitting: Radiation Oncology

## 2021-08-10 DIAGNOSIS — C541 Malignant neoplasm of endometrium: Secondary | ICD-10-CM | POA: Diagnosis not present

## 2021-08-10 DIAGNOSIS — Z510 Encounter for antineoplastic radiation therapy: Secondary | ICD-10-CM | POA: Insufficient documentation

## 2021-08-12 ENCOUNTER — Other Ambulatory Visit: Payer: Self-pay | Admitting: *Deleted

## 2021-08-12 DIAGNOSIS — Z510 Encounter for antineoplastic radiation therapy: Secondary | ICD-10-CM | POA: Diagnosis not present

## 2021-08-12 DIAGNOSIS — C541 Malignant neoplasm of endometrium: Secondary | ICD-10-CM

## 2021-08-17 ENCOUNTER — Ambulatory Visit
Admission: RE | Admit: 2021-08-17 | Discharge: 2021-08-17 | Disposition: A | Discharge status: Home / Self Care | Payer: Medicare HMO | Source: Ambulatory Visit | Attending: Radiation Oncology | Admitting: Radiation Oncology

## 2021-08-17 ENCOUNTER — Other Ambulatory Visit: Payer: Self-pay

## 2021-08-17 DIAGNOSIS — Z510 Encounter for antineoplastic radiation therapy: Secondary | ICD-10-CM | POA: Diagnosis not present

## 2021-08-17 LAB — RAD ONC ARIA SESSION SUMMARY
Course Elapsed Days: 0
Plan Fractions Treated to Date: 1
Plan Prescribed Dose Per Fraction: 1.8 Gy
Plan Total Fractions Prescribed: 14
Plan Total Prescribed Dose: 25.2 Gy
Reference Point Dosage Given to Date: 1.8 Gy
Reference Point Session Dosage Given: 1.8 Gy
Session Number: 1

## 2021-08-18 ENCOUNTER — Ambulatory Visit
Admission: RE | Admit: 2021-08-18 | Discharge: 2021-08-18 | Disposition: A | Discharge status: Home / Self Care | Payer: Medicare HMO | Source: Ambulatory Visit | Attending: Radiation Oncology | Admitting: Radiation Oncology

## 2021-08-18 ENCOUNTER — Other Ambulatory Visit: Payer: Self-pay

## 2021-08-18 DIAGNOSIS — Z510 Encounter for antineoplastic radiation therapy: Secondary | ICD-10-CM | POA: Diagnosis not present

## 2021-08-18 LAB — RAD ONC ARIA SESSION SUMMARY
Course Elapsed Days: 1
Plan Fractions Treated to Date: 2
Plan Prescribed Dose Per Fraction: 1.8 Gy
Plan Total Fractions Prescribed: 14
Plan Total Prescribed Dose: 25.2 Gy
Reference Point Dosage Given to Date: 3.6 Gy
Reference Point Session Dosage Given: 1.8 Gy
Session Number: 2

## 2021-08-19 ENCOUNTER — Ambulatory Visit
Admission: RE | Admit: 2021-08-19 | Discharge: 2021-08-19 | Disposition: A | Discharge status: Home / Self Care | Payer: Medicare HMO | Source: Ambulatory Visit | Attending: Radiation Oncology | Admitting: Radiation Oncology

## 2021-08-19 ENCOUNTER — Other Ambulatory Visit: Payer: Self-pay

## 2021-08-19 DIAGNOSIS — Z510 Encounter for antineoplastic radiation therapy: Secondary | ICD-10-CM | POA: Diagnosis not present

## 2021-08-19 LAB — RAD ONC ARIA SESSION SUMMARY
Course Elapsed Days: 2
Plan Fractions Treated to Date: 3
Plan Prescribed Dose Per Fraction: 1.8 Gy
Plan Total Fractions Prescribed: 14
Plan Total Prescribed Dose: 25.2 Gy
Reference Point Dosage Given to Date: 5.4 Gy
Reference Point Session Dosage Given: 1.8 Gy
Session Number: 3

## 2021-08-22 ENCOUNTER — Other Ambulatory Visit: Payer: Self-pay

## 2021-08-22 ENCOUNTER — Ambulatory Visit
Admission: RE | Admit: 2021-08-22 | Discharge: 2021-08-22 | Disposition: A | Discharge status: Home / Self Care | Payer: Medicare HMO | Source: Ambulatory Visit | Attending: Radiation Oncology | Admitting: Radiation Oncology

## 2021-08-22 DIAGNOSIS — Z510 Encounter for antineoplastic radiation therapy: Secondary | ICD-10-CM | POA: Diagnosis not present

## 2021-08-22 LAB — RAD ONC ARIA SESSION SUMMARY
Course Elapsed Days: 5
Plan Fractions Treated to Date: 4
Plan Prescribed Dose Per Fraction: 1.8 Gy
Plan Total Fractions Prescribed: 14
Plan Total Prescribed Dose: 25.2 Gy
Reference Point Dosage Given to Date: 7.2 Gy
Reference Point Session Dosage Given: 1.8 Gy
Session Number: 4

## 2021-08-23 ENCOUNTER — Other Ambulatory Visit: Payer: Self-pay

## 2021-08-23 ENCOUNTER — Ambulatory Visit
Admission: RE | Admit: 2021-08-23 | Discharge: 2021-08-23 | Disposition: A | Discharge status: Home / Self Care | Payer: Medicare HMO | Source: Ambulatory Visit | Attending: Radiation Oncology | Admitting: Radiation Oncology

## 2021-08-23 DIAGNOSIS — Z510 Encounter for antineoplastic radiation therapy: Secondary | ICD-10-CM | POA: Diagnosis not present

## 2021-08-23 LAB — RAD ONC ARIA SESSION SUMMARY
Course Elapsed Days: 6
Plan Fractions Treated to Date: 5
Plan Prescribed Dose Per Fraction: 1.8 Gy
Plan Total Fractions Prescribed: 14
Plan Total Prescribed Dose: 25.2 Gy
Reference Point Dosage Given to Date: 9 Gy
Reference Point Session Dosage Given: 1.8 Gy
Session Number: 5

## 2021-08-24 ENCOUNTER — Other Ambulatory Visit: Payer: Self-pay

## 2021-08-24 ENCOUNTER — Ambulatory Visit
Admission: RE | Admit: 2021-08-24 | Discharge: 2021-08-24 | Disposition: A | Discharge status: Home / Self Care | Payer: Medicare HMO | Source: Ambulatory Visit | Attending: Radiation Oncology | Admitting: Radiation Oncology

## 2021-08-24 ENCOUNTER — Inpatient Hospital Stay: Payer: Medicare HMO | Attending: Obstetrics and Gynecology

## 2021-08-24 DIAGNOSIS — C541 Malignant neoplasm of endometrium: Secondary | ICD-10-CM | POA: Insufficient documentation

## 2021-08-24 DIAGNOSIS — Z510 Encounter for antineoplastic radiation therapy: Secondary | ICD-10-CM | POA: Diagnosis not present

## 2021-08-24 LAB — RAD ONC ARIA SESSION SUMMARY
Course Elapsed Days: 7
Plan Fractions Treated to Date: 6
Plan Prescribed Dose Per Fraction: 1.8 Gy
Plan Total Fractions Prescribed: 14
Plan Total Prescribed Dose: 25.2 Gy
Reference Point Dosage Given to Date: 10.8 Gy
Reference Point Session Dosage Given: 1.8 Gy
Session Number: 6

## 2021-08-25 ENCOUNTER — Ambulatory Visit
Admission: RE | Admit: 2021-08-25 | Discharge: 2021-08-25 | Disposition: A | Discharge status: Home / Self Care | Payer: Medicare HMO | Source: Ambulatory Visit | Attending: Radiation Oncology | Admitting: Radiation Oncology

## 2021-08-25 ENCOUNTER — Other Ambulatory Visit: Payer: Self-pay

## 2021-08-25 DIAGNOSIS — Z510 Encounter for antineoplastic radiation therapy: Secondary | ICD-10-CM | POA: Diagnosis not present

## 2021-08-25 LAB — RAD ONC ARIA SESSION SUMMARY
Course Elapsed Days: 8
Plan Fractions Treated to Date: 7
Plan Prescribed Dose Per Fraction: 1.8 Gy
Plan Total Fractions Prescribed: 14
Plan Total Prescribed Dose: 25.2 Gy
Reference Point Dosage Given to Date: 12.6 Gy
Reference Point Session Dosage Given: 1.8 Gy
Session Number: 7

## 2021-08-26 ENCOUNTER — Ambulatory Visit
Admission: RE | Admit: 2021-08-26 | Discharge: 2021-08-26 | Disposition: A | Discharge status: Home / Self Care | Payer: Medicare HMO | Source: Ambulatory Visit | Attending: Radiation Oncology | Admitting: Radiation Oncology

## 2021-08-26 ENCOUNTER — Other Ambulatory Visit: Payer: Self-pay

## 2021-08-26 DIAGNOSIS — Z510 Encounter for antineoplastic radiation therapy: Secondary | ICD-10-CM | POA: Diagnosis not present

## 2021-08-26 LAB — RAD ONC ARIA SESSION SUMMARY
Course Elapsed Days: 9
Plan Fractions Treated to Date: 8
Plan Prescribed Dose Per Fraction: 1.8 Gy
Plan Total Fractions Prescribed: 14
Plan Total Prescribed Dose: 25.2 Gy
Reference Point Dosage Given to Date: 14.4 Gy
Reference Point Session Dosage Given: 1.8 Gy
Session Number: 8

## 2021-08-29 ENCOUNTER — Ambulatory Visit
Admission: RE | Admit: 2021-08-29 | Discharge: 2021-08-29 | Disposition: A | Discharge status: Home / Self Care | Payer: Medicare HMO | Source: Ambulatory Visit | Attending: Radiation Oncology | Admitting: Radiation Oncology

## 2021-08-29 ENCOUNTER — Other Ambulatory Visit: Payer: Self-pay

## 2021-08-29 DIAGNOSIS — Z510 Encounter for antineoplastic radiation therapy: Secondary | ICD-10-CM | POA: Diagnosis not present

## 2021-08-29 LAB — RAD ONC ARIA SESSION SUMMARY
Course Elapsed Days: 12
Plan Fractions Treated to Date: 9
Plan Prescribed Dose Per Fraction: 1.8 Gy
Plan Total Fractions Prescribed: 14
Plan Total Prescribed Dose: 25.2 Gy
Reference Point Dosage Given to Date: 16.2 Gy
Reference Point Session Dosage Given: 1.8 Gy
Session Number: 9

## 2021-08-30 ENCOUNTER — Other Ambulatory Visit: Payer: Self-pay

## 2021-08-30 ENCOUNTER — Ambulatory Visit
Admission: RE | Admit: 2021-08-30 | Discharge: 2021-08-30 | Disposition: A | Discharge status: Home / Self Care | Payer: Medicare HMO | Source: Ambulatory Visit | Attending: Radiation Oncology | Admitting: Radiation Oncology

## 2021-08-30 DIAGNOSIS — Z510 Encounter for antineoplastic radiation therapy: Secondary | ICD-10-CM | POA: Diagnosis not present

## 2021-08-30 LAB — RAD ONC ARIA SESSION SUMMARY
Course Elapsed Days: 13
Plan Fractions Treated to Date: 10
Plan Prescribed Dose Per Fraction: 1.8 Gy
Plan Total Fractions Prescribed: 14
Plan Total Prescribed Dose: 25.2 Gy
Reference Point Dosage Given to Date: 18 Gy
Reference Point Session Dosage Given: 1.8 Gy
Session Number: 10

## 2021-08-31 ENCOUNTER — Other Ambulatory Visit: Payer: Self-pay

## 2021-08-31 ENCOUNTER — Ambulatory Visit
Admission: RE | Admit: 2021-08-31 | Discharge: 2021-08-31 | Disposition: A | Discharge status: Home / Self Care | Payer: Medicare HMO | Source: Ambulatory Visit | Attending: Radiation Oncology | Admitting: Radiation Oncology

## 2021-08-31 ENCOUNTER — Inpatient Hospital Stay: Payer: Medicare HMO

## 2021-08-31 DIAGNOSIS — Z510 Encounter for antineoplastic radiation therapy: Secondary | ICD-10-CM | POA: Diagnosis not present

## 2021-08-31 DIAGNOSIS — C541 Malignant neoplasm of endometrium: Secondary | ICD-10-CM | POA: Diagnosis present

## 2021-08-31 LAB — CBC
HCT: 34.5 % — ABNORMAL LOW (ref 36.0–46.0)
Hemoglobin: 10.8 g/dL — ABNORMAL LOW (ref 12.0–15.0)
MCH: 27.1 pg (ref 26.0–34.0)
MCHC: 31.3 g/dL (ref 30.0–36.0)
MCV: 86.5 fL (ref 80.0–100.0)
Platelets: 205 10*3/uL (ref 150–400)
RBC: 3.99 MIL/uL (ref 3.87–5.11)
RDW: 18 % — ABNORMAL HIGH (ref 11.5–15.5)
WBC: 5.1 10*3/uL (ref 4.0–10.5)
nRBC: 0 % (ref 0.0–0.2)

## 2021-08-31 LAB — RAD ONC ARIA SESSION SUMMARY
Course Elapsed Days: 14
Plan Fractions Treated to Date: 11
Plan Prescribed Dose Per Fraction: 1.8 Gy
Plan Total Fractions Prescribed: 14
Plan Total Prescribed Dose: 25.2 Gy
Reference Point Dosage Given to Date: 19.8 Gy
Reference Point Session Dosage Given: 1.8 Gy
Session Number: 11

## 2021-09-01 ENCOUNTER — Other Ambulatory Visit: Payer: Self-pay

## 2021-09-01 ENCOUNTER — Ambulatory Visit
Admission: RE | Admit: 2021-09-01 | Discharge: 2021-09-01 | Disposition: A | Discharge status: Home / Self Care | Payer: Medicare HMO | Source: Ambulatory Visit | Attending: Radiation Oncology | Admitting: Radiation Oncology

## 2021-09-01 DIAGNOSIS — Z510 Encounter for antineoplastic radiation therapy: Secondary | ICD-10-CM | POA: Diagnosis not present

## 2021-09-01 LAB — RAD ONC ARIA SESSION SUMMARY
Course Elapsed Days: 15
Plan Fractions Treated to Date: 12
Plan Prescribed Dose Per Fraction: 1.8 Gy
Plan Total Fractions Prescribed: 14
Plan Total Prescribed Dose: 25.2 Gy
Reference Point Dosage Given to Date: 21.6 Gy
Reference Point Session Dosage Given: 1.8 Gy
Session Number: 12

## 2021-09-02 ENCOUNTER — Other Ambulatory Visit: Payer: Self-pay

## 2021-09-02 ENCOUNTER — Ambulatory Visit
Admission: RE | Admit: 2021-09-02 | Discharge: 2021-09-02 | Disposition: A | Discharge status: Home / Self Care | Payer: Medicare HMO | Source: Ambulatory Visit | Attending: Radiation Oncology | Admitting: Radiation Oncology

## 2021-09-02 DIAGNOSIS — Z510 Encounter for antineoplastic radiation therapy: Secondary | ICD-10-CM | POA: Diagnosis present

## 2021-09-02 DIAGNOSIS — C541 Malignant neoplasm of endometrium: Secondary | ICD-10-CM | POA: Insufficient documentation

## 2021-09-02 LAB — RAD ONC ARIA SESSION SUMMARY
Course Elapsed Days: 16
Plan Fractions Treated to Date: 13
Plan Prescribed Dose Per Fraction: 1.8 Gy
Plan Total Fractions Prescribed: 14
Plan Total Prescribed Dose: 25.2 Gy
Reference Point Dosage Given to Date: 23.4 Gy
Reference Point Session Dosage Given: 1.8 Gy
Session Number: 13

## 2021-09-06 ENCOUNTER — Ambulatory Visit: Payer: Medicare HMO

## 2021-09-06 ENCOUNTER — Other Ambulatory Visit: Payer: Self-pay

## 2021-09-06 ENCOUNTER — Ambulatory Visit
Admission: RE | Admit: 2021-09-06 | Discharge: 2021-09-06 | Disposition: A | Discharge status: Home / Self Care | Payer: Medicare HMO | Source: Ambulatory Visit | Attending: Radiation Oncology | Admitting: Radiation Oncology

## 2021-09-06 DIAGNOSIS — Z510 Encounter for antineoplastic radiation therapy: Secondary | ICD-10-CM | POA: Diagnosis not present

## 2021-09-06 LAB — RAD ONC ARIA SESSION SUMMARY
Course Elapsed Days: 20
Plan Fractions Treated to Date: 14
Plan Prescribed Dose Per Fraction: 1.8 Gy
Plan Total Fractions Prescribed: 14
Plan Total Prescribed Dose: 25.2 Gy
Reference Point Dosage Given to Date: 25.2 Gy
Reference Point Session Dosage Given: 1.8 Gy
Session Number: 14

## 2021-09-07 ENCOUNTER — Ambulatory Visit: Payer: Medicare HMO

## 2021-10-06 ENCOUNTER — Ambulatory Visit
Admission: RE | Admit: 2021-10-06 | Discharge: 2021-10-06 | Disposition: A | Discharge status: Home / Self Care | Payer: Medicare HMO | Source: Ambulatory Visit | Attending: Radiation Oncology | Admitting: Radiation Oncology

## 2021-10-06 ENCOUNTER — Encounter: Payer: Self-pay | Admitting: Radiation Oncology

## 2021-10-06 VITALS — BP 133/66 | HR 51 | Temp 98.8°F | Resp 18

## 2021-10-06 DIAGNOSIS — Z923 Personal history of irradiation: Secondary | ICD-10-CM | POA: Insufficient documentation

## 2021-10-06 DIAGNOSIS — C541 Malignant neoplasm of endometrium: Secondary | ICD-10-CM | POA: Insufficient documentation

## 2021-10-06 NOTE — Progress Notes (Signed)
Radiation Oncology Follow up Note  Name: Selena Rodgers   Date:   10/06/2021 MRN:  [id number] DOB: 06/06/1950    This 71 y.o. 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  REFERRING PROVIDER: [name], MD  HPI: Patient is a 71 year old female now out 1 month having completed external beam radiation therapy to her pelvis for stage III well differentiated endometrioid adenocarcinoma.  Seen today in routine follow-up she is doing well.  She specifically denies any increased lower urinary tract symptoms diarrhea fatigue or any other somatic complaints.  She is having no vaginal discharge..  COMPLICATIONS OF TREATMENT: none  FOLLOW UP COMPLIANCE: keeps appointments   PHYSICAL EXAM:  BP 133/66 (BP Location: Left Arm, Patient Position: Sitting)   Pulse (!) 51   Temp 98.8 F (37.1 C) (Tympanic)   Resp 18  Well-developed well-nourished patient in NAD. HEENT reveals PERLA, EOMI, discs not visualized.  Oral cavity is clear. No oral mucosal lesions are identified. Neck is clear without evidence of cervical or supraclavicular adenopathy. Lungs are clear to A&P. Cardiac examination is essentially unremarkable with regular rate and rhythm without murmur rub or thrill. Abdomen is benign with no organomegaly or masses noted. Motor sensory and DTR levels are equal and symmetric in the upper and lower extremities. Cranial nerves II through XII are grossly intact. Proprioception is intact. No peripheral adenopathy or edema is identified. No motor or sensory levels are noted. Crude visual fields are within normal range.  RADIOLOGY RESULTS: No current films for review  PLAN: Present time patient is doing well extremely low side effect profile from her recent whole pelvic radiation.  And pleased with her  overall progress.  She is seeing Dr. Fransisca Connors later this month.  I have asked to see her back in 4 to 5 months for follow-up.  Patient knows to call with any concerns at any time.  I would like to take this opportunity to thank you for allowing me to participate in the care of your patient.Noreene Filbert, MD

## 2021-10-09 ENCOUNTER — Other Ambulatory Visit: Payer: Self-pay | Admitting: Nurse Practitioner

## 2021-10-17 ENCOUNTER — Encounter: Payer: Self-pay | Admitting: Nurse Practitioner

## 2021-10-19 ENCOUNTER — Inpatient Hospital Stay: Payer: Medicare HMO | Attending: Obstetrics and Gynecology | Admitting: Obstetrics and Gynecology

## 2021-10-19 VITALS — BP 153/79 | HR 94 | Temp 96.6°F | Resp 20 | Wt 264.8 lb

## 2021-10-19 DIAGNOSIS — E119 Type 2 diabetes mellitus without complications: Secondary | ICD-10-CM | POA: Diagnosis not present

## 2021-10-19 DIAGNOSIS — K439 Ventral hernia without obstruction or gangrene: Secondary | ICD-10-CM | POA: Diagnosis not present

## 2021-10-19 DIAGNOSIS — G8929 Other chronic pain: Secondary | ICD-10-CM | POA: Insufficient documentation

## 2021-10-19 DIAGNOSIS — I10 Essential (primary) hypertension: Secondary | ICD-10-CM | POA: Diagnosis not present

## 2021-10-19 DIAGNOSIS — Z923 Personal history of irradiation: Secondary | ICD-10-CM | POA: Insufficient documentation

## 2021-10-19 DIAGNOSIS — F17210 Nicotine dependence, cigarettes, uncomplicated: Secondary | ICD-10-CM | POA: Diagnosis not present

## 2021-10-19 DIAGNOSIS — C541 Malignant neoplasm of endometrium: Secondary | ICD-10-CM | POA: Diagnosis present

## 2021-10-19 MED ORDER — AMOXICILLIN-POT CLAVULANATE 875-125 MG PO TABS: 1.0000 | ORAL_TABLET | Freq: Two times a day (BID) | ORAL | 0 refills | Status: AC

## 2021-10-19 NOTE — Progress Notes (Signed)
Gynecologic Oncology Consult Visit   Referring Provider: Dr Leafy Ro  Chief Concern: endometrial cancer, grade 1, menorrhagia  Subjective:  Selena Rodgers is a 71 y.o. G19 female initially seen in consultation from Dr. Leafy Ro for endometrial cancer, grade 1, s/p palliative radiation for bleeding, felt to not be a surgical candidate, now s/p EBRT and on megace 40 mg BID. She completed radiation 09/06/21. Continues megace. She returns to clinic for follow up  She feels well. Continues to recover from radiation. Denies vaginal bleeding or spotting.   Gynecologic Oncology History:  Seen for PMB in 11/22 by Dr Leafy Ro.  Prior endometrial ablation in 2000.   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  Took some Megace, but bleeding got worse, so stopped.  Bad back limits her mobility. Chronic pain.   She has gigantic lower abdominal hernia s/p prior unsuccessful repair with mesh due to incarcerated bowel.     We previously discussed options for management including surgery, radiation, hormonal therapy.  She has a very large recurrent hernia in the lower abdomen that creates a challenge for hysterectomy however, this is her personal preference and expresses desire to have hernia revision as part of joint procedure. Additionally, patient is obese with nulliparous pelvis.   Pelvic MRI 02/16/21 1. Signs of diffuse endometrial cancer within the uterus up to 90% of the myometrial thickness is involved. Tumor appears to extend to serosa in the RIGHT parametrium. Potential nodular area extending beyond the uterine fundus as well though on sagittal post-contrast images this cannot be connected to subjacent tumor within the uterus in appears separated from tumor by myometrium. 2. Indeterminate area in the RIGHT pelvis follows broad ligament and may represent an atrophic ovary. Given above findings PET scan may be helpful for complete staging.  3. Large hiatal hernia  with 13 cm rectus separation above the symphysis pubis.   PET - 02/23/21 IMPRESSION: 1. Mild limitations secondary to patient body habitus. 2. Hypermetabolic uterine primary. 3. Mildly hypermetabolic abdominal retroperitoneal and pelvic nodes.The inguinal nodes are likely reactive and are typical in the setting of obesity. The right external iliac and abdominal retroperitoneal nodes are equivocal, mildly suspicious for metastatic disease. 4. No supradiaphragmatic metastatic disease identified. 5. Incidental findings, including: Coronary artery atherosclerosis. Aortic Atherosclerosis (ICD10-I70.0).  Prior to completing radiation, 3 treatments remained of about 15, she developed pneumonia and was hospitalized. During hospitalization she suffered delirium and developed infection of skin of pannus. She was discharged to skilled nursing. She transferred to home mid-April and is now back to baseline in regards to physicality. She has lost 17lbs. Now lives alone. Uses a wheelchair for arthritis of lower extremities. Feels well and denies other complaints. She continues megace and denies bleeding since March.   Problem List: Patient Active Problem List   Diagnosis Date Noted   Bacteremia 03/18/2021   Hyponatremia 03/17/2021   Anemia 03/17/2021   Diabetes ([place]) 03/17/2021   GERD (gastroesophageal reflux disease) 03/17/2021   CAP (community acquired pneumonia) 03/17/2021   Cellulitis of abdominal wall 03/17/2021   Cellulitis 03/17/2021   Endometrial cancer ([place]) 02/09/2021   Post-menopausal bleeding 02/09/2021   Acquired scoliosis 11/11/2020   Arthrodesis status 10/19/2020   History of lumbar fusion 04/13/2020   Other forms of scoliosis, site unspecified 08/29/2019   Body mass index (BMI) 40.0-44.9, adult ([place]) 08/29/2019   Lip swelling 07/28/2019   Spondylolisthesis, lumbar region 06/25/2019   Neck pain 02/25/2016   History of excision of lamina of cervical vertebra  for decompression of  spinal cord 06/22/2015   Adjustment disorder with mixed anxiety and depressed mood    Slow transit constipation    Myelopathy ([place]) 04/21/2015   Cervical myelopathy ([facility])    Constipation due to pain medication    Depression    Primary osteoarthritis of right shoulder    S/P spinal surgery    Abnormality of gait    Generalized weakness    Other secondary hypertension    Post-operative pain    Arthritis    Cervical spondylosis with myelopathy 04/19/2015   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   Ventral hernia 10/07/2014   Current tobacco use 10/06/2014   Anxiety 09/17/2014   Back pain 09/17/2014   Clinical depression 09/17/2014   BP (high blood pressure) 09/17/2014   Borderline diabetes 09/17/2014   Arthritis, degenerative 09/17/2014   Spinal stenosis 09/17/2014   Compulsive tobacco user syndrome 09/17/2014   Abdominal mass 06/30/2014   Incisional hernia, without obstruction or gangrene 06/30/2014   Morbid obesity ([place]) 06/30/2014   Hypercholesteremia 05/23/2013   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   Hypercholesterolemia 05/23/2013   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) 05/23/2013   Past Medical History: Past Medical History:  Diagnosis Date   Anxiety    Arthritis    Chronic back pain    Diabetes ([place]) 03/17/2021   Endometrial cancer ([place]) 02/09/2021   GERD (gastroesophageal reflux disease)    Hypertension    Post-menopausal bleeding    Spinal stenosis    Umbilical hernia    Uterine cancer ([place])    Ventral hernia     Past Surgical History: Past Surgical History:  Procedure Laterality Date   ANTERIOR CERVICAL DECOMP/DISCECTOMY FUSION N/A 04/19/2015   Procedure: ANTERIOR CERVICAL DECOMPRESSION/DISCECTOMY FUSION INTERBODY PROTHESIS PLATING BONEGRAFT CERVICAL THREE-FOUR ,CERVICAL FOUR-FIVE;  Surgeon: Newman Pies, MD;  Location: [facility];  Service: Neurosurgery;  Laterality: N/A;   BREAST BIOPSY     CARPAL TUNNEL RELEASE     CHOLECYSTECTOMY     FOOT  SURGERY Right    IRRIGATION AND DEBRIDEMENT ABDOMEN  01/10/2014   Dr. Marina Gravel   TEE WITHOUT CARDIOVERSION N/A 03/24/2021   Procedure: TRANSESOPHAGEAL ECHOCARDIOGRAM (TEE);  Surgeon: Minna Merritts, MD;  Location: [facility];  Service: Cardiovascular;  Laterality: N/A;   [place]  12/26/2013   Dr. Pat Patrick   WOUND EXPLORATION N/A 07/14/2019   Procedure: LUMBAR EXPLORATION EVACUATION OF HEMATOMA;  Surgeon: Newman Pies, MD;  Location: [facility];  Service: Neurosurgery;  Laterality: N/A;     OB History:  OB History  No obstetric history on file.    Family History: Family History  Problem Relation Age of Onset   Diabetes Maternal Grandmother    Diabetes Maternal Grandfather    Diabetes Paternal Grandmother    Diabetes Paternal Grandfather    Hypertension Mother     Social History: Social History   Socioeconomic History   Marital status: Single    Spouse name: Not on file   Number of children: Not on file   Years of education: Not on file   Highest education level: Not on file  Occupational History   Not on file  Tobacco Use   Smoking status: Some Days   Smokeless tobacco: Never   Tobacco comments:    5 cigs daily  Vaping Use   Vaping Use: Never used  Substance and Sexual Activity   Alcohol use: No    Alcohol/week: 0.0 standard drinks of alcohol  Drug use: No   Sexual activity: Not Currently    Birth control/protection: None  Other Topics Concern   Not on file  Social History Narrative   Not on file   Social Determinants of Health   Financial Resource Strain: Not on file  Food Insecurity: Not on file  Transportation Needs: Not on file  Physical Activity: Not on file  Stress: Not on file  Social Connections: Not on file  Intimate Partner Violence: Not on file    Allergies: Allergies  Allergen Reactions   Chlorpheniramine Anaphylaxis   Flaxseed (Linseed) Anaphylaxis   Codeine Hives   Flax Seed Oil [Flax Seed Oil]  Swelling    UNSPECIFIED    Latex Hives and Other (See Comments)    BLISTERS   Tape Other (See Comments)    BLISTERS   Tapentadol Other (See Comments)    BLISTERS   Rosemary Oil Swelling    Lip swelling   2,4-D Dimethylamine Rash    T-DAP   Aspirin Rash    Current Medications: Current Outpatient Medications  Medication Sig Dispense Refill   acetaminophen (TYLENOL) 500 MG tablet Take 2 tablets (1,000 mg total) by mouth 3 (three) times daily. 30 tablet 0   ALPRAZolam (XANAX) 0.25 MG tablet Take by mouth 2 (two) times daily.     citalopram (CELEXA) 20 MG tablet Take 20 mg by mouth daily.     diclofenac Sodium (VOLTAREN) 1 % GEL Apply 4 g topically 4 (four) times daily.     famotidine (PEPCID) 20 MG tablet Take 1 tablet (20 mg total) by mouth 2 (two) times daily.     FIBER-LAX 625 MG tablet Take by mouth.     megestrol (MEGACE) 40 MG tablet TAKE 1 TABLET BY MOUTH TWICE A DAY 180 tablet 0   meloxicam (MOBIC) 15 MG tablet Take 15 mg by mouth daily.     metFORMIN (GLUCOPHAGE) 500 MG tablet Take 500 mg by mouth 2 (two) times daily.     methocarbamol (ROBAXIN) 500 MG tablet Take 1 tablet (500 mg total) by mouth every 8 (eight) hours as needed for muscle spasms.     mirtazapine (REMERON) 15 MG tablet Take 1 tablet (15 mg total) by mouth at bedtime.     omeprazole (PRILOSEC) 40 MG capsule Take 40 mg by mouth daily.      pantoprazole (PROTONIX) 40 MG tablet Take 1 tablet (40 mg total) by mouth daily.     senna-docusate (SENOKOT-S) 8.6-50 MG tablet Take 2 tablets by mouth at bedtime.     simethicone (MYLICON) 80 MG chewable tablet Chew 1 tablet (80 mg total) by mouth 4 (four) times daily. 30 tablet 0   sodium chloride 1 g tablet Take 1 tablet (1 g total) by mouth 2 (two) times daily with a meal.     vitamin B-12 (CYANOCOBALAMIN) 1000 MCG tablet Take 1,000 mcg by mouth daily.     No current facility-administered medications for this visit.   Review of Systems General:  no complaints Skin: no  complaints Eyes: no complaints HEENT: no complaints Breasts: no complaints Pulmonary: no complaints Cardiac: no complaints Gastrointestinal: no complaints Genitourinary/Sexual: no complaints Ob/Gyn: no complaints Musculoskeletal: no complaints Hematology: no complaints Neurologic/Psych: no complaints   Objective:  Physical Examination:  BP (!) 153/79   Pulse 94   Temp (!) 96.6 F (35.9 C)   Resp 20   Wt 264 lb 12.8 oz (120.1 kg)   SpO2 100%   BMI 41.47 kg/m  ECOG Performance Status: 1 - Symptomatic but completely ambulatory  GENERAL: Patient is a well appearing female in no acute distress HEENT:  Sclera clear. Anicteric NODES:  Negative axillary, supraclavicular, inguinal lymph node survery LUNGS:  Clear to auscultation bilaterally.   HEART:  Regular rate and rhythm.  ABDOMEN:  Soft, nontender.  No hernias, incisions well healed. No masses or ascites EXTREMITIES:  No peripheral edema. Atraumatic. No cyanosis SKIN:  Clear with no obvious rashes or skin changes.  NEURO:  Nonfocal. Well oriented.  Appropriate affect.  Pelvic: Exam chaperoned by NP.  EGBUS within normal limits, normal vagina and vulva Vulva: normal appearing vulva with no masses, tenderness or lesions Vagina: normal vagina;  Adnexa: normal adnexa in size, nontender and no masses;  Uterus: uterus is normal size, shape, consistency and nontender;  Cervix: anteverted; Rectal: not indicated.  Lab Results No labs on site today. Lab Results  Component Value Date   WBC 5.1 08/31/2021   HGB 10.8 (L) 08/31/2021   HCT 34.5 (L) 08/31/2021   MCV 86.5 08/31/2021   PLT 205 08/31/2021     Chemistry      Component Value Date/Time   NA 128 (L) 03/27/2021 0834   NA 137 01/10/2014 0416   K 4.5 03/27/2021 0834   K 3.5 01/10/2014 0416   CL 97 (L) 03/27/2021 0834   CL 106 01/10/2014 0416   CO2 22 03/27/2021 0834   CO2 26 01/10/2014 0416   BUN 22 03/27/2021 0834   BUN 6 (L) 01/10/2014 0416   CREATININE  0.76 03/27/2021 0834   CREATININE 0.80 01/10/2014 0416      Component Value Date/Time   CALCIUM 8.5 (L) 03/27/2021 0834   CALCIUM 8.3 (L) 01/10/2014 0416   ALKPHOS 42 03/17/2021 0416   ALKPHOS 65 12/28/2013 0500   AST 30 03/17/2021 0416   AST 22 12/28/2013 0500   ALT 13 03/17/2021 0416   ALT 16 12/28/2013 0500   BILITOT 0.5 03/17/2021 0416   BILITOT 0.4 12/28/2013 0500      Assessment:  SHARLYNE KOENEMAN is a 71 y.o. G55 female diagnosed with grade 1 endometrial adenocarcinoma.  She has a large ventral hernia with prior failed mesh repair and relatively poor PS. In view of this we were considering all possible option for treatment, including non-surgical approaches with radiation and hormones (eg. IUD).  MRI 2/23 showed signs of diffuse endometrial cancer within the uterus up to 90% of the myometrial thickness is involved. Tumor appears to extend to serosa in the RIGHT parametrium. Potential nodular area extending beyond the uterine fundus as well. PET scan showed hypermetabolic uterine primary.  Mildly hypermetabolic abdominal retroperitoneal and pelvic nodes. The right external iliac and abdominal retroperitoneal nodes are equivocal, mildly suspicious for metastatic disease.   Elected to start treatment with radiation 3/23 in view of concern for locally advanced disease.  She completed about 12 of 15 planned treatments and developed pneumonia and was hospitalized. During hospitalization she suffered delirium and developed infection of skin of pannus. She was discharged to skilled nursing. She transferred to home mid-April and is now back to baseline. Uses a wheelchair for arthritis of lower extremities. She continues megace 40 mg qd for hormonal therapy of endometrial cancer and denies bleeding since March. She would like to have surgery to remove the cancer and also repair the large ventral hernia, but understands that this is problematic.   MMR IHC shows intact expression.  QNS for MSI  testing.   Medical co-morbidities  complicating care: Massive abdominal hernia, AODM, morbid obesity (BMI 41), chronic back pain.  Plan:   Problem List Items Addressed This Visit       Genitourinary   Endometrial cancer ([place]) - Primary   Relevant Medications   amoxicillin-clavulanate (AUGMENTIN) 875-125 MG tablet   Other Relevant Orders   Amb Referral to Survivorship Program   We again discussed options for management.  She has a very large recurrent hernia in the lower abdomen that would make hysterectomy difficult with significant risk of postop wound and other complications.  In addition, initial local extension of disease and possible nodal mets are problematic wrt curative surgery. It would be best to continue with hormonal therapy with Megace until there are signs of disease progression.    Dr Chrisandra Carota in [place] surgery does not think surgical repair of the large abdominal hernia is achievable in view of her less than optimal PS and prior failure of mesh repair with morbid obesity and recent radiation.    The patient's diagnosis, an outline of the further diagnostic and laboratory studies which will be required, the recommendation, and alternatives were discussed.  All questions were answered to the patient's satisfaction.  She will RTC in 4 months with Dr Baruch Gouty in 4 months and in 8 months with [place] or sooner if new concerning symptoms.   Verlon Au, NP  I personally interviewed and examined the patient. Agreed with the above/below plan of care. I have directly contributed to assessment and plan of care of this patient and educated and discussed with patient and family.  Mellody Drown, MD  CC:  [name], MD [facility] [employer],  [place] [phone]

## 2021-10-27 ENCOUNTER — Other Ambulatory Visit: Payer: Self-pay | Admitting: Nurse Practitioner

## 2021-10-27 ENCOUNTER — Encounter: Payer: Self-pay | Admitting: Nurse Practitioner

## 2021-10-27 MED ORDER — FLUCONAZOLE 150 MG PO TABS: 150.0000 mg | ORAL_TABLET | Freq: Every day | ORAL | 0 refills | Status: AC

## 2021-12-11 ENCOUNTER — Other Ambulatory Visit: Payer: Self-pay | Admitting: Nurse Practitioner

## 2021-12-13 MED ORDER — MEGESTROL ACETATE 40 MG PO TABS: 40.0000 mg | ORAL_TABLET | Freq: Two times a day (BID) | ORAL | 0 refills | Status: DC

## 2022-01-25 ENCOUNTER — Inpatient Hospital Stay: Payer: Medicare HMO | Attending: Obstetrics and Gynecology | Admitting: Obstetrics and Gynecology

## 2022-01-25 ENCOUNTER — Encounter: Payer: Self-pay | Admitting: Obstetrics and Gynecology

## 2022-01-25 VITALS — BP 129/70 | HR 90 | Temp 96.6°F | Resp 20 | Wt 258.1 lb

## 2022-01-25 DIAGNOSIS — F17210 Nicotine dependence, cigarettes, uncomplicated: Secondary | ICD-10-CM | POA: Diagnosis not present

## 2022-01-25 DIAGNOSIS — I700 Atherosclerosis of aorta: Secondary | ICD-10-CM | POA: Diagnosis not present

## 2022-01-25 DIAGNOSIS — C541 Malignant neoplasm of endometrium: Secondary | ICD-10-CM | POA: Diagnosis present

## 2022-01-25 DIAGNOSIS — Z923 Personal history of irradiation: Secondary | ICD-10-CM | POA: Diagnosis not present

## 2022-01-25 DIAGNOSIS — G8929 Other chronic pain: Secondary | ICD-10-CM | POA: Insufficient documentation

## 2022-01-25 DIAGNOSIS — E119 Type 2 diabetes mellitus without complications: Secondary | ICD-10-CM | POA: Insufficient documentation

## 2022-01-25 DIAGNOSIS — Z6841 Body Mass Index (BMI) 40.0 and over, adult: Secondary | ICD-10-CM | POA: Diagnosis not present

## 2022-01-25 DIAGNOSIS — I10 Essential (primary) hypertension: Secondary | ICD-10-CM | POA: Diagnosis not present

## 2022-01-25 DIAGNOSIS — K469 Unspecified abdominal hernia without obstruction or gangrene: Secondary | ICD-10-CM | POA: Diagnosis not present

## 2022-01-25 NOTE — Progress Notes (Signed)
Gynecologic Oncology Consult Visit   Referring Provider: Dr Leafy Ro  Chief Concern: endometrial cancer, grade 1, menorrhagia  Subjective:  Selena Rodgers is a 72 y.o. G8 female initially seen in consultation from Dr. Leafy Ro for endometrial cancer, grade 1, s/p palliative radiation for bleeding, felt to not be a surgical candidate, now s/p EBRT and on megace 40 mg BID. She completed radiation 09/06/21.   Continues megace. She returns to clinic for follow up  She feels well. Denies vaginal bleeding or spotting.   Gynecologic Oncology History:  Seen for PMB in 11/22 by Dr Leafy Ro.  Prior endometrial ablation in 2000.   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  Took some Megace, but bleeding got worse, so stopped.  Bad back limits her mobility. Chronic pain.   She has gigantic lower abdominal hernia s/p prior unsuccessful repair with mesh due to incarcerated bowel.     We previously discussed options for management including surgery, radiation, hormonal therapy.  She has a very large recurrent hernia in the lower abdomen that creates a challenge for hysterectomy however, this is her personal preference and expresses desire to have hernia revision as part of joint procedure. Additionally, patient is obese with nulliparous pelvis.   Pelvic MRI 02/16/21 1. Signs of diffuse endometrial cancer within the uterus up to 90% of the myometrial thickness is involved. Tumor appears to extend to serosa in the RIGHT parametrium. Potential nodular area extending beyond the uterine fundus as well though on sagittal post-contrast images this cannot be connected to subjacent tumor within the uterus in appears separated from tumor by myometrium. 2. Indeterminate area in the RIGHT pelvis follows broad ligament and may represent an atrophic ovary. Given above findings PET scan may be helpful for complete staging.  3. Large hiatal hernia with 13 cm rectus separation  above the symphysis pubis.   PET - 02/23/21 IMPRESSION: 1. Mild limitations secondary to patient body habitus. 2. Hypermetabolic uterine primary. 3. Mildly hypermetabolic abdominal retroperitoneal and pelvic nodes.The inguinal nodes are likely reactive and are typical in the setting of obesity. The right external iliac and abdominal retroperitoneal nodes are equivocal, mildly suspicious for metastatic disease. 4. No supradiaphragmatic metastatic disease identified. 5. Incidental findings, including: Coronary artery atherosclerosis. Aortic Atherosclerosis (ICD10-I70.0).  Prior to completing radiation, 3 treatments remained of about 15, she developed pneumonia and was hospitalized. During hospitalization she suffered delirium and developed infection of skin of pannus. She was discharged to skilled nursing. She transferred to home mid-April and is now back to baseline in regards to physicality. She has lost 17lbs. Now lives alone. Uses a wheelchair for arthritis of lower extremities. Feels well and denies other complaints. She continues megace and denies bleeding since March.   Problem List: Patient Active Problem List   Diagnosis Date Noted   Bacteremia 03/18/2021   Hyponatremia 03/17/2021   Anemia 03/17/2021   Diabetes ([place]) 03/17/2021   GERD (gastroesophageal reflux disease) 03/17/2021   CAP (community acquired pneumonia) 03/17/2021   Cellulitis of abdominal wall 03/17/2021   Cellulitis 03/17/2021   Endometrial cancer ([place]) 02/09/2021   Post-menopausal bleeding 02/09/2021   Acquired scoliosis 11/11/2020   Arthrodesis status 10/19/2020   History of lumbar fusion 04/13/2020   Other forms of scoliosis, site unspecified 08/29/2019   Body mass index (BMI) 40.0-44.9, adult ([place]) 08/29/2019   Lip swelling 07/28/2019   Spondylolisthesis, lumbar region 06/25/2019   Neck pain 02/25/2016   History of excision of lamina of cervical vertebra for decompression of  spinal cord 06/22/2015    Adjustment disorder with mixed anxiety and depressed mood    Slow transit constipation    Myelopathy ([place]) 04/21/2015   Cervical myelopathy ([facility])    Constipation due to pain medication    Depression    Primary osteoarthritis of right shoulder    S/P spinal surgery    Abnormality of gait    Generalized weakness    Other secondary hypertension    Post-operative pain    Arthritis    Cervical spondylosis with myelopathy 04/19/2015   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   Ventral hernia 10/07/2014   Current tobacco use 10/06/2014   Anxiety 09/17/2014   Back pain 09/17/2014   Clinical depression 09/17/2014   BP (high blood pressure) 09/17/2014   Borderline diabetes 09/17/2014   Arthritis, degenerative 09/17/2014   Spinal stenosis 09/17/2014   Compulsive tobacco user syndrome 09/17/2014   Abdominal mass 06/30/2014   Incisional hernia, without obstruction or gangrene 06/30/2014   Morbid obesity ([place]) 06/30/2014   Hypercholesteremia 05/23/2013   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   Hypercholesterolemia 05/23/2013   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) 05/23/2013   Past Medical History: Past Medical History:  Diagnosis Date   Anxiety    Arthritis    Chronic back pain    Diabetes ([place]) 03/17/2021   Endometrial cancer ([place]) 02/09/2021   GERD (gastroesophageal reflux disease)    Hypertension    Post-menopausal bleeding    Spinal stenosis    Umbilical hernia    Uterine cancer ([place])    Ventral hernia     Past Surgical History: Past Surgical History:  Procedure Laterality Date   ANTERIOR CERVICAL DECOMP/DISCECTOMY FUSION N/A 04/19/2015   Procedure: ANTERIOR CERVICAL DECOMPRESSION/DISCECTOMY FUSION INTERBODY PROTHESIS PLATING BONEGRAFT CERVICAL THREE-FOUR ,CERVICAL FOUR-FIVE;  Surgeon: Newman Pies, MD;  Location: [facility];  Service: Neurosurgery;  Laterality: N/A;   BREAST BIOPSY     CARPAL TUNNEL RELEASE     CHOLECYSTECTOMY     FOOT SURGERY Right    IRRIGATION  AND DEBRIDEMENT ABDOMEN  01/10/2014   Dr. Marina Gravel   TEE WITHOUT CARDIOVERSION N/A 03/24/2021   Procedure: TRANSESOPHAGEAL ECHOCARDIOGRAM (TEE);  Surgeon: Minna Merritts, MD;  Location: [facility];  Service: Cardiovascular;  Laterality: N/A;   [place]  12/26/2013   Dr. Pat Patrick   WOUND EXPLORATION N/A 07/14/2019   Procedure: LUMBAR EXPLORATION EVACUATION OF HEMATOMA;  Surgeon: Newman Pies, MD;  Location: [facility];  Service: Neurosurgery;  Laterality: N/A;     OB History:  OB History  No obstetric history on file.    Family History: Family History  Problem Relation Age of Onset   Diabetes Maternal Grandmother    Diabetes Maternal Grandfather    Diabetes Paternal Grandmother    Diabetes Paternal Grandfather    Hypertension Mother     Social History: Social History   Socioeconomic History   Marital status: Single    Spouse name: Not on file   Number of children: Not on file   Years of education: Not on file   Highest education level: Not on file  Occupational History   Not on file  Tobacco Use   Smoking status: Some Days   Smokeless tobacco: Never   Tobacco comments:    5 cigs daily  Vaping Use   Vaping Use: Never used  Substance and Sexual Activity   Alcohol use: No    Alcohol/week: 0.0 standard drinks of alcohol   Drug use:  No   Sexual activity: Not Currently    Birth control/protection: None  Other Topics Concern   Not on file  Social History Narrative   Not on file   Social Determinants of Health   Financial Resource Strain: Not on file  Food Insecurity: Not on file  Transportation Needs: Not on file  Physical Activity: Not on file  Stress: Not on file  Social Connections: Not on file  Intimate Partner Violence: Not on file    Allergies: Allergies  Allergen Reactions   Chlorpheniramine Anaphylaxis   Flaxseed (Linseed) Anaphylaxis   Codeine Hives   Flax Seed Oil [Flax Seed Oil] Swelling    UNSPECIFIED    Latex  Hives and Other (See Comments)    BLISTERS   Tape Other (See Comments)    BLISTERS   Tapentadol Other (See Comments)    BLISTERS   Rosemary Oil Swelling    Lip swelling   2,4-D Dimethylamine Rash    T-DAP   Aspirin Rash    Current Medications: Current Outpatient Medications  Medication Sig Dispense Refill   acetaminophen (TYLENOL) 500 MG tablet Take 2 tablets (1,000 mg total) by mouth 3 (three) times daily. 30 tablet 0   ALPRAZolam (XANAX) 0.25 MG tablet Take by mouth 2 (two) times daily.     citalopram (CELEXA) 20 MG tablet Take 20 mg by mouth daily.     diclofenac Sodium (VOLTAREN) 1 % GEL Apply 4 g topically 4 (four) times daily.     famotidine (PEPCID) 20 MG tablet Take 1 tablet (20 mg total) by mouth 2 (two) times daily.     FIBER-LAX 625 MG tablet Take by mouth.     fluconazole (DIFLUCAN) 150 MG tablet Take 1 tablet (150 mg total) by mouth daily. Take 1 tablet (150 mg) by mouth. Three days later, take second tablet. 2 tablet 0   megestrol (MEGACE) 40 MG tablet Take 1 tablet (40 mg total) by mouth 2 (two) times daily. 180 tablet 0   meloxicam (MOBIC) 15 MG tablet Take 15 mg by mouth daily.     metFORMIN (GLUCOPHAGE) 500 MG tablet Take 500 mg by mouth 2 (two) times daily.     methocarbamol (ROBAXIN) 500 MG tablet Take 1 tablet (500 mg total) by mouth every 8 (eight) hours as needed for muscle spasms.     mirtazapine (REMERON) 15 MG tablet Take 1 tablet (15 mg total) by mouth at bedtime.     omeprazole (PRILOSEC) 40 MG capsule Take 40 mg by mouth daily.      pantoprazole (PROTONIX) 40 MG tablet Take 1 tablet (40 mg total) by mouth daily.     senna-docusate (SENOKOT-S) 8.6-50 MG tablet Take 2 tablets by mouth at bedtime.     simethicone (MYLICON) 80 MG chewable tablet Chew 1 tablet (80 mg total) by mouth 4 (four) times daily. 30 tablet 0   sodium chloride 1 g tablet Take 1 tablet (1 g total) by mouth 2 (two) times daily with a meal.     vitamin B-12 (CYANOCOBALAMIN) 1000 MCG tablet  Take 1,000 mcg by mouth daily.     No current facility-administered medications for this visit.   Review of Systems General:  no complaints Skin: no complaints Eyes: no complaints HEENT: no complaints Breasts: no complaints Pulmonary: no complaints Cardiac: no complaints Gastrointestinal: no complaints Genitourinary/Sexual: no complaints Ob/Gyn: no complaints Musculoskeletal: no complaints Hematology: no complaints Neurologic/Psych: no complaints   Objective:  Physical Examination:  BP 129/70   Pulse  90   Temp (!) 96.6 F (35.9 C)   Resp 20   Wt 258 lb 1.6 oz (117.1 kg)   SpO2 100%   BMI 40.42 kg/m    ECOG Performance Status: 1 - Symptomatic but completely ambulatory  GENERAL: Patient is a well appearing female in no acute distress HEENT:  Sclera clear. Anicteric NODES:  Negative axillary, supraclavicular, inguinal lymph node survery LUNGS:  Clear to auscultation bilaterally.   HEART:  Regular rate and rhythm.  ABDOMEN:  Soft, nontender.  No hernias, incisions well healed. No masses or ascites EXTREMITIES:  No peripheral edema. Atraumatic. No cyanosis SKIN:  Clear with no obvious rashes or skin changes.  NEURO:  Nonfocal. Well oriented.  Appropriate affect.  Pelvic: Exam chaperoned by NP.  EGBUS within normal limits, normal vagina and vulva Vulva: normal appearing vulva with no masses, tenderness or lesions Vagina: normal vagina;  Adnexa: normal adnexa in size, nontender and no masses;  Uterus: uterus is normal size, shape, consistency and nontender;  Cervix: anteverted; Rectal: not indicated.  Lab Results No labs on site today. Lab Results  Component Value Date   WBC 5.1 08/31/2021   HGB 10.8 (L) 08/31/2021   HCT 34.5 (L) 08/31/2021   MCV 86.5 08/31/2021   PLT 205 08/31/2021     Chemistry      Component Value Date/Time   NA 128 (L) 03/27/2021 0834   NA 137 01/10/2014 0416   K 4.5 03/27/2021 0834   K 3.5 01/10/2014 0416   CL 97 (L) 03/27/2021 0834    CL 106 01/10/2014 0416   CO2 22 03/27/2021 0834   CO2 26 01/10/2014 0416   BUN 22 03/27/2021 0834   BUN 6 (L) 01/10/2014 0416   CREATININE 0.76 03/27/2021 0834   CREATININE 0.80 01/10/2014 0416      Component Value Date/Time   CALCIUM 8.5 (L) 03/27/2021 0834   CALCIUM 8.3 (L) 01/10/2014 0416   ALKPHOS 42 03/17/2021 0416   ALKPHOS 65 12/28/2013 0500   AST 30 03/17/2021 0416   AST 22 12/28/2013 0500   ALT 13 03/17/2021 0416   ALT 16 12/28/2013 0500   BILITOT 0.5 03/17/2021 0416   BILITOT 0.4 12/28/2013 0500      Assessment:  Selena Rodgers is a 72 y.o. G42 female diagnosed with grade 1 endometrial adenocarcinoma.  She has a large ventral hernia with prior failed mesh repair and relatively poor PS. In view of this we were considering all possible options for treatment, including non-surgical approaches with radiation and hormones (eg. IUD).  MRI 2/23 showed signs of diffuse endometrial cancer within the uterus up to 90% of the myometrial thickness is involved. Tumor appears to extend to serosa in the RIGHT parametrium. Potential nodular area extending beyond the uterine fundus as well. PET scan showed hypermetabolic uterine primary.  Mildly hypermetabolic abdominal retroperitoneal and pelvic nodes. The right external iliac and abdominal retroperitoneal nodes are equivocal, mildly suspicious for metastatic disease.   Elected to start treatment with radiation 3/23 in view of concern for locally advanced disease.  She completed about 12 of 15 planned treatments and developed pneumonia and was hospitalized. During hospitalization she suffered delirium and developed infection of skin of pannus. She was discharged to skilled nursing. She transferred to home mid-April and is now back to baseline. Uses a wheelchair for arthritis of lower extremities. She continues megace 40 mg qd for hormonal therapy of endometrial cancer and denies bleeding since March. She would like to have surgery  to remove  the cancer and also repair the large ventral hernia, but understands that this is problematic.   MMR IHC shows intact expression.  QNS for MSI testing.   Medical co-morbidities complicating care: Massive abdominal hernia, AODM, morbid obesity (BMI 41), chronic back pain.  Plan:   Problem List Items Addressed This Visit       Genitourinary   Endometrial cancer ([place]) - Primary   We again discussed options for management.  She has a very large recurrent hernia in the lower abdomen that would make hysterectomy difficult with significant risk of postop wound and other complications.  In addition, initial local extension of disease and possible nodal mets are problematic wrt curative surgery. It would be best to continue with hormonal therapy with Megace until there are signs of disease progression.    Dr Chrisandra Carota in [facility] does not think surgical repair of the large abdominal hernia is achievable in view of her less than optimal PS and prior failure of mesh repair with morbid obesity and recent radiation.    The patient's diagnosis, an outline of the further diagnostic and laboratory studies which will be required, the recommendation, and alternatives were discussed.  All questions were answered to the patient's satisfaction.  She will RTC in 4 months with Dr Baruch Gouty in 4 months and in 8 months with [place] or sooner if new concerning symptoms.   Mellody Drown, MD    CC:  [name], MD [address] [place],  [place] [address] [phone]

## 2022-02-15 ENCOUNTER — Ambulatory Visit: Payer: Medicare HMO | Admitting: Radiation Oncology

## 2022-04-06 ENCOUNTER — Other Ambulatory Visit: Payer: Self-pay | Admitting: Nurse Practitioner

## 2022-04-10 ENCOUNTER — Other Ambulatory Visit: Payer: Self-pay | Admitting: Nurse Practitioner

## 2022-04-10 ENCOUNTER — Encounter: Payer: Self-pay | Admitting: Nurse Practitioner

## 2022-04-10 MED ORDER — MEGESTROL ACETATE 40 MG PO TABS: 40.0000 mg | ORAL_TABLET | Freq: Two times a day (BID) | ORAL | 3 refills | Status: DC

## 2022-05-24 ENCOUNTER — Ambulatory Visit
Admission: RE | Admit: 2022-05-24 | Discharge: 2022-05-24 | Disposition: A | Discharge status: Home / Self Care | Payer: Medicare HMO | Source: Ambulatory Visit | Attending: Radiation Oncology | Admitting: Radiation Oncology

## 2022-05-24 ENCOUNTER — Other Ambulatory Visit: Payer: Self-pay | Admitting: *Deleted

## 2022-05-24 VITALS — BP 127/79 | HR 90 | Temp 99.9°F | Ht 67.0 in

## 2022-05-24 DIAGNOSIS — C541 Malignant neoplasm of endometrium: Secondary | ICD-10-CM

## 2022-05-24 NOTE — Progress Notes (Signed)
Radiation Oncology Follow up Note  Name: Selena Rodgers   Date:   05/24/2022 MRN:  [id number] DOB: 1950/06/30    This 72 y.o. 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  She then received external beam radiation therapy to her pelvis.  Seen today in routine follow-up she is doing well.  She is having no abdominal pain or discomfort no vaginal discharge bleeding.  Bowel function is fine no lower urinary tract symptoms are reported..  COMPLICATIONS OF TREATMENT: none  FOLLOW UP COMPLIANCE: keeps appointments   PHYSICAL EXAM:  BP 127/79   Pulse 90   Temp 99.9 F (37.7 C)   Ht 5\' 7"  (1.702 m)   BMI 40.42 kg/m  Well-developed well-nourished patient in NAD. HEENT reveals PERLA, EOMI, discs not visualized.  Oral cavity is clear. No oral mucosal lesions are identified. Neck is clear without evidence of cervical or supraclavicular adenopathy. Lungs are clear to A&P. Cardiac examination is essentially unremarkable with regular rate and rhythm without murmur rub or thrill. Abdomen is benign with no organomegaly or masses noted. Motor sensory and DTR levels are equal and symmetric in the upper and lower extremities. Cranial nerves II through XII are grossly intact. Proprioception is intact. No peripheral adenopathy or edema is identified. No motor or sensory levels are noted. Crude visual fields are within normal range.  RADIOLOGY RESULTS: CT scan of the abdomen pelvis ordered prior to Dr. Annye Rusk next visit  PLAN: Present time patient is doing well of asked to see her back in 1 year  for follow-up.  She sees Dr. Johnnette Litter in September I ordered a CT scan of the abdomen pelvis prior to that visit.  Patient knows to call at anytime with any concerns.  I would like to take this opportunity to thank you for allowing me to participate in the care of your patient.Carmina Miller, MD

## 2022-09-20 ENCOUNTER — Ambulatory Visit
Admission: RE | Admit: 2022-09-20 | Discharge: 2022-09-20 | Disposition: A | Discharge status: Home / Self Care | Payer: Medicare HMO | Source: Ambulatory Visit | Attending: Radiation Oncology | Admitting: Radiation Oncology

## 2022-09-20 DIAGNOSIS — C541 Malignant neoplasm of endometrium: Secondary | ICD-10-CM | POA: Diagnosis present

## 2022-09-20 LAB — POCT I-STAT CREATININE: Creatinine, Ser: 0.8 mg/dL (ref 0.44–1.00)

## 2022-09-20 MED ORDER — IOHEXOL 300 MG/ML  SOLN
100.0000 mL | Freq: Once | INTRAMUSCULAR | Status: AC | PRN
  Administered 2022-09-20: 100 mL via INTRAVENOUS

## 2022-09-27 ENCOUNTER — Inpatient Hospital Stay: Payer: Medicare HMO | Attending: Oncology | Admitting: Obstetrics and Gynecology

## 2022-09-27 VITALS — BP 112/88 | HR 81 | Resp 19 | Wt 257.3 lb

## 2022-09-27 DIAGNOSIS — Z79899 Other long term (current) drug therapy: Secondary | ICD-10-CM | POA: Diagnosis not present

## 2022-09-27 DIAGNOSIS — Z6841 Body Mass Index (BMI) 40.0 and over, adult: Secondary | ICD-10-CM | POA: Diagnosis not present

## 2022-09-27 DIAGNOSIS — C541 Malignant neoplasm of endometrium: Secondary | ICD-10-CM | POA: Diagnosis present

## 2022-09-27 DIAGNOSIS — Z5181 Encounter for therapeutic drug level monitoring: Secondary | ICD-10-CM

## 2022-09-27 DIAGNOSIS — Z923 Personal history of irradiation: Secondary | ICD-10-CM | POA: Diagnosis not present

## 2022-09-27 NOTE — Progress Notes (Signed)
Gynecologic Oncology Consult Visit   Referring Provider: Dr Dalbert Garnet  Chief Concern: endometrial cancer, grade 1, menorrhagia  Subjective:  Selena Rodgers is a 72 y.o. G0 female initially seen in consultation from Dr. Dalbert Garnet for endometrial cancer, grade 1, s/p palliative radiation for bleeding, felt to not be a surgical candidate, now s/p EBRT and on megace 40 mg BID. She completed radiation 09/06/21.   Continues megace. She returns to clinic for follow up. She has no significant complaints and this specifically no bleeding.   Gynecologic Oncology History:  Seen for PMB in 11/22 by Dr Dalbert Garnet.  Prior endometrial ablation in 2000.   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  Took some Megace, but bleeding got worse, so stopped.  Bad back limits her mobility. Chronic pain.   She has gigantic lower abdominal hernia s/p prior unsuccessful repair with mesh due to incarcerated bowel.     We previously discussed options for management including surgery, radiation, hormonal therapy.  She has a very large recurrent hernia in the lower abdomen that creates a challenge for hysterectomy however, this is her personal preference and expresses desire to have hernia revision as part of joint procedure. Additionally, patient is obese with nulliparous pelvis.   Pelvic MRI 02/16/21 1. Signs of diffuse endometrial cancer within the uterus up to 90% of the myometrial thickness is involved. Tumor appears to extend to serosa in the RIGHT parametrium. Potential nodular area extending beyond the uterine fundus as well though on sagittal post-contrast images this cannot be connected to subjacent tumor within the uterus in appears separated from tumor by myometrium. 2. Indeterminate area in the RIGHT pelvis follows broad ligament and may represent an atrophic ovary. Given above findings PET scan may be helpful for complete staging.  3. Large hiatal hernia with 13 cm rectus  separation above the symphysis pubis.   PET - 02/23/21 IMPRESSION: 1. Mild limitations secondary to patient body habitus. 2. Hypermetabolic uterine primary. 3. Mildly hypermetabolic abdominal retroperitoneal and pelvic nodes.The inguinal nodes are likely reactive and are typical in the setting of obesity. The right external iliac and abdominal retroperitoneal nodes are equivocal, mildly suspicious for metastatic disease. 4. No supradiaphragmatic metastatic disease identified. 5. Incidental findings, including: Coronary artery atherosclerosis. Aortic Atherosclerosis (ICD10-I70.0).  Prior to completing radiation, 3 treatments remained of about 15, she developed pneumonia and was hospitalized. During hospitalization she suffered delirium and developed infection of skin of pannus. She was discharged to skilled nursing. She transferred to home mid-April and is now back to baseline in regards to physicality. She has lost 17lbs. Now lives alone. Uses a wheelchair for arthritis of lower extremities. Feels well and denies other complaints. She continues megace and denies bleeding since March.   Problem List: Patient Active Problem List   Diagnosis Date Noted   Bacteremia 03/18/2021   Hyponatremia 03/17/2021   Anemia 03/17/2021   Diabetes (HCC) 03/17/2021   GERD (gastroesophageal reflux disease) 03/17/2021   CAP (community acquired pneumonia) 03/17/2021   Cellulitis of abdominal wall 03/17/2021   Cellulitis 03/17/2021   Endometrial cancer (HCC) 02/09/2021   Post-menopausal bleeding 02/09/2021   Acquired scoliosis 11/11/2020   Arthrodesis status 10/19/2020   History of lumbar fusion 04/13/2020   Other forms of scoliosis, site unspecified 08/29/2019   Body mass index (BMI) 40.0-44.9, adult (HCC) 08/29/2019   Lip swelling 07/28/2019   Spondylolisthesis, lumbar region 06/25/2019   Neck pain 02/25/2016   History of excision of lamina of cervical vertebra for decompression  of spinal cord 06/22/2015    Adjustment disorder with mixed anxiety and depressed mood    Slow transit constipation    Myelopathy (HCC) 04/21/2015   Cervical myelopathy (HCC)    Constipation due to pain medication    Depression    Primary osteoarthritis of right shoulder    S/P spinal surgery    Abnormality of gait    Generalized weakness    Other secondary hypertension    Post-operative pain    Arthritis    Cervical spondylosis with myelopathy 04/19/2015   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   Ventral hernia 10/07/2014   Current tobacco use 10/06/2014   Anxiety 09/17/2014   Back pain 09/17/2014   Clinical depression 09/17/2014   BP (high blood pressure) 09/17/2014   Borderline diabetes 09/17/2014   Arthritis, degenerative 09/17/2014   Spinal stenosis 09/17/2014   Compulsive tobacco user syndrome 09/17/2014   Abdominal mass 06/30/2014   Incisional hernia, without obstruction or gangrene 06/30/2014   Morbid obesity (HCC) 06/30/2014   Hypercholesteremia 05/23/2013   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   Hypercholesterolemia 05/23/2013   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) 05/23/2013   Past Medical History: Past Medical History:  Diagnosis Date   Anxiety    Arthritis    Chronic back pain    Diabetes (HCC) 03/17/2021   Endometrial cancer (HCC) 02/09/2021   GERD (gastroesophageal reflux disease)    Hypertension    Post-menopausal bleeding    Spinal stenosis    Umbilical hernia    Uterine cancer (HCC)    Ventral hernia     Past Surgical History: Past Surgical History:  Procedure Laterality Date   ANTERIOR CERVICAL DECOMP/DISCECTOMY FUSION N/A 04/19/2015   Procedure: ANTERIOR CERVICAL DECOMPRESSION/DISCECTOMY FUSION INTERBODY PROTHESIS PLATING BONEGRAFT CERVICAL THREE-FOUR ,CERVICAL FOUR-FIVE;  Surgeon: Tressie Stalker, MD;  Location: [facility];  Service: Neurosurgery;  Laterality: N/A;   BREAST BIOPSY     CARPAL TUNNEL RELEASE     CHOLECYSTECTOMY     FOOT SURGERY Right    IRRIGATION  AND DEBRIDEMENT ABDOMEN  01/10/2014   Dr. Egbert Garibaldi   TEE WITHOUT CARDIOVERSION N/A 03/24/2021   Procedure: TRANSESOPHAGEAL ECHOCARDIOGRAM (TEE);  Surgeon: Antonieta Iba, MD;  Location: [facility];  Service: Cardiovascular;  Laterality: N/A;   UMBILICAL HERNIA REPAIR     VENTRAL HERNIA REPAIR  12/26/2013   Dr. Michela Pitcher   WOUND EXPLORATION N/A 07/14/2019   Procedure: LUMBAR EXPLORATION EVACUATION OF HEMATOMA;  Surgeon: Tressie Stalker, MD;  Location: [facility];  Service: Neurosurgery;  Laterality: N/A;     OB History:  OB History  No obstetric history on file.    Family History: Family History  Problem Relation Age of Onset   Diabetes Maternal Grandmother    Diabetes Maternal Grandfather    Diabetes Paternal Grandmother    Diabetes Paternal Grandfather    Hypertension Mother     Social History: Social History   Socioeconomic History   Marital status: Single    Spouse name: Not on file   Number of children: Not on file   Years of education: Not on file   Highest education level: Not on file  Occupational History   Not on file  Tobacco Use   Smoking status: Some Days   Smokeless tobacco: Never   Tobacco comments:    5 cigs daily  Vaping Use   Vaping status: Never Used  Substance and Sexual Activity   Alcohol use: No    Alcohol/week: 0.0 standard drinks of alcohol   Drug  use: No   Sexual activity: Not Currently    Birth control/protection: None  Other Topics Concern   Not on file  Social History Narrative   Not on file   Social Determinants of Health   Financial Resource Strain: Not on file  Food Insecurity: Not on file  Transportation Needs: Not on file  Physical Activity: Not on file  Stress: Not on file  Social Connections: Unknown (05/16/2021)   Received from [facility]   Social Network    Social Network: Not on file  Intimate Partner Violence: Unknown (04/07/2021)   Received from [facility]   HITS    Physically Hurt: Not on file     Insult or Talk Down To: Not on file    Threaten Physical Harm: Not on file    Scream or Curse: Not on file    Allergies: Allergies  Allergen Reactions   Chlorpheniramine Anaphylaxis   Flaxseed (Linseed) Anaphylaxis   Codeine Hives   Flax Seed Oil [Flax Seed Oil] Swelling    UNSPECIFIED    Latex Hives and Other (See Comments)    BLISTERS   Tape Other (See Comments)    BLISTERS   Tapentadol Other (See Comments)    BLISTERS   Rosemary Oil Swelling    Lip swelling   2,4-D Dimethylamine Rash    T-DAP   Aspirin Rash    Current Medications: Current Outpatient Medications  Medication Sig Dispense Refill   acetaminophen (TYLENOL) 500 MG tablet Take 2 tablets (1,000 mg total) by mouth 3 (three) times daily. 30 tablet 0   ALPRAZolam (XANAX) 0.25 MG tablet Take by mouth 2 (two) times daily.     citalopram (CELEXA) 20 MG tablet Take 20 mg by mouth daily.     diclofenac Sodium (VOLTAREN) 1 % GEL Apply 4 g topically 4 (four) times daily.     famotidine (PEPCID) 20 MG tablet Take 1 tablet (20 mg total) by mouth 2 (two) times daily.     FIBER-LAX 625 MG tablet Take by mouth.     fluconazole (DIFLUCAN) 150 MG tablet Take 1 tablet (150 mg total) by mouth daily. Take 1 tablet (150 mg) by mouth. Three days later, take second tablet. 2 tablet 0   megestrol (MEGACE) 40 MG tablet Take 1 tablet (40 mg total) by mouth 2 (two) times daily. 180 tablet 3   meloxicam (MOBIC) 15 MG tablet Take 15 mg by mouth daily.     metFORMIN (GLUCOPHAGE) 500 MG tablet Take 500 mg by mouth 2 (two) times daily.     methocarbamol (ROBAXIN) 500 MG tablet Take 1 tablet (500 mg total) by mouth every 8 (eight) hours as needed for muscle spasms.     mirtazapine (REMERON) 15 MG tablet Take 1 tablet (15 mg total) by mouth at bedtime.     omeprazole (PRILOSEC) 40 MG capsule Take 40 mg by mouth daily.      pantoprazole (PROTONIX) 40 MG tablet Take 1 tablet (40 mg total) by mouth daily.     senna-docusate (SENOKOT-S) 8.6-50  MG tablet Take 2 tablets by mouth at bedtime.     simethicone (MYLICON) 80 MG chewable tablet Chew 1 tablet (80 mg total) by mouth 4 (four) times daily. 30 tablet 0   sodium chloride 1 g tablet Take 1 tablet (1 g total) by mouth 2 (two) times daily with a meal.     vitamin B-12 (CYANOCOBALAMIN) 1000 MCG tablet Take 1,000 mcg by mouth daily.  No current facility-administered medications for this visit.   Review of Systems General:  no complaints Skin: no complaints Eyes: no complaints HEENT: no complaints Breasts: no complaints Pulmonary: no complaints Cardiac: no complaints Gastrointestinal: no complaints Genitourinary/Sexual: no complaints Ob/Gyn: no complaints Musculoskeletal: back pain chronic Hematology: no complaints Neurologic/Psych: no complaints   Objective:  Physical Examination:  BP 112/88   Pulse 81   Resp 19   Wt 257 lb 4.8 oz (116.7 kg)   SpO2 98%   BMI 40.30 kg/m    ECOG Performance Status: 1 - Symptomatic but completely ambulatory  GENERAL: She arrived to clinic in a wheelchair and in no acute distress HEENT: Atraumatic normocephalic NODES:  Negative axillary, supraclavicular, inguinal lymph node survery LUNGS: Normal respiratory effort ABDOMEN:  Soft, nontender.  Large ventral hernia at is protuberant.  No masses or ascites NEURO:  Nonfocal. Well oriented.  Appropriate affect.  Pelvic: Exam chaperoned by RN EGBUS within normal limits, normal vagina and vulva Vulva: normal appearing vulva with no masses, tenderness or lesions Vagina: normal vagina; no bleeding or lesions Adnexa: Limited by exam unable to definitively palpate the adnexa. Uterus: Unable to determine uterine size due to the large ventral hernia Cervix: Cervix is normal in appearance. BME; soft parametrial and paravaginal tissue  Lab Results N/a  Radiology:  9/18 CT A/P results are still pending   Assessment:  EMBYR SIM is a 72 y.o. G0 female diagnosed with grade 1  endometrial adenocarcinoma.  She has a large ventral hernia with prior failed mesh repair and relatively poor PS. In view of this we were considering all possible options for treatment, including non-surgical approaches with radiation and hormones (eg. IUD).  MRI 2/23 showed signs of diffuse endometrial cancer within the uterus up to 90% of the myometrial thickness is involved. Tumor appears to extend to serosa in the RIGHT parametrium. Potential nodular area extending beyond the uterine fundus as well. PET scan showed hypermetabolic uterine primary.  Mildly hypermetabolic abdominal retroperitoneal and pelvic nodes. The right external iliac and abdominal retroperitoneal nodes are equivocal, mildly suspicious for metastatic disease.   Elected to start treatment with radiation 3/23 in view of concern for locally advanced disease.  She completed about 12 of 15 planned treatments and developed pneumonia and was hospitalized. During hospitalization she suffered delirium and developed infection of skin of pannus. She was discharged to skilled nursing. She transferred to home mid-April and is now back to baseline. Uses a wheelchair for arthritis of lower extremities. She continues megace 40 mg qd for hormonal therapy of endometrial cancer and denies bleeding since March. She would like to have surgery to remove the cancer and also repair the large ventral hernia, but understands that this is problematic.   MMR IHC shows intact expression.  QNS for MSI testing.   Medical co-morbidities complicating care: Massive abdominal hernia, AODM, morbid obesity (BMI 41), chronic back pain.  Plan:   Problem List Items Addressed This Visit       Genitourinary   Endometrial cancer (HCC) - Primary   Other Visit Diagnoses     Encounter for monitoring adjuvant hormonal therapy           Continue with hormonal therapy with Megace until there are signs of disease progression.  Follow-up CT scan of the abdomen and pelvis.   On inspection today of the films there is not appear to be any abnormal intrauterine findings  Dr Diamond Nickel in [facility] previously did not think surgical repair  of the large abdominal hernia is achievable in view of her less than optimal PS and prior failure of mesh repair with morbid obesity and recent radiation.    The patient's diagnosis, an outline of the further diagnostic and laboratory studies which will be required, the recommendation, and alternatives were discussed.  All questions were answered to the patient's satisfaction.  She will RTC in 4 months with [place] and in 8 months with Dr Rushie Chestnut or sooner if new concerning symptoms.   Shaylene Paganelli Leta Jungling, MD

## 2023-01-24 ENCOUNTER — Ambulatory Visit: Payer: Medicare HMO

## 2023-01-31 ENCOUNTER — Inpatient Hospital Stay: Payer: Medicare HMO | Attending: Obstetrics and Gynecology | Admitting: Obstetrics and Gynecology

## 2023-01-31 VITALS — BP 139/64 | HR 92 | Temp 97.6°F | Resp 19 | Wt 264.4 lb

## 2023-01-31 DIAGNOSIS — G8929 Other chronic pain: Secondary | ICD-10-CM | POA: Diagnosis not present

## 2023-01-31 DIAGNOSIS — Z923 Personal history of irradiation: Secondary | ICD-10-CM | POA: Insufficient documentation

## 2023-01-31 DIAGNOSIS — M1990 Unspecified osteoarthritis, unspecified site: Secondary | ICD-10-CM | POA: Insufficient documentation

## 2023-01-31 DIAGNOSIS — M549 Dorsalgia, unspecified: Secondary | ICD-10-CM | POA: Insufficient documentation

## 2023-01-31 DIAGNOSIS — C541 Malignant neoplasm of endometrium: Secondary | ICD-10-CM | POA: Insufficient documentation

## 2023-01-31 DIAGNOSIS — K439 Ventral hernia without obstruction or gangrene: Secondary | ICD-10-CM | POA: Insufficient documentation

## 2023-01-31 DIAGNOSIS — Z6841 Body Mass Index (BMI) 40.0 and over, adult: Secondary | ICD-10-CM | POA: Insufficient documentation

## 2023-01-31 DIAGNOSIS — E119 Type 2 diabetes mellitus without complications: Secondary | ICD-10-CM | POA: Insufficient documentation

## 2023-01-31 DIAGNOSIS — Z79818 Long term (current) use of other agents affecting estrogen receptors and estrogen levels: Secondary | ICD-10-CM | POA: Diagnosis not present

## 2023-01-31 NOTE — Progress Notes (Signed)
Gynecologic Oncology Consult Visit   Referring Provider: Dr Dalbert Garnet  Chief Concern: endometrial cancer, grade 1, menorrhagia  Subjective:  Selena Rodgers is a 73 y.o. G0 female initially seen in consultation from Dr. Dalbert Garnet for endometrial cancer, grade 1, s/p palliative radiation for bleeding, felt to not be a surgical candidate, now s/p EBRT and on megace 40 mg BID. She completed radiation 09/06/21.   Continues megace. She returns to clinic for follow up. She has no significant complaints and this specifically no bleeding.  CT scan 09/20/22 FINDINGS: Lower chest: Unremarkable.   Hepatobiliary: No suspicious focal abnormality within the liver parenchyma. Gallbladder is surgically absent. No intrahepatic or extrahepatic biliary dilation.   Pancreas: No focal mass lesion. No dilatation of the main duct. No intraparenchymal cyst. No peripancreatic edema.   Spleen: No splenomegaly. No suspicious focal mass lesion.   Adrenals/Urinary Tract: No adrenal nodule or mass. Punctate nonobstructing stone identified lower pole right kidney. 9 mm exophytic lesion upper pole right kidney on 27/2 shows increased attenuation compatible with enhancement or proteinaceous/hemorrhagic debris. This lesion was probably present on the 2016 exam measuring about 8 mm at that time. 1.6 cm exophytic cyst noted lower pole left kidney. No evidence for hydroureter. The urinary bladder appears normal for the degree of distention.   Stomach/Bowel: Stomach is unremarkable. No gastric wall thickening. No evidence of outlet obstruction. Duodenum is normally positioned as is the ligament of Treitz. No small bowel wall thickening. No small bowel dilatation. The terminal ileum is normal. The appendix is normal. No gross colonic mass. No colonic wall thickening. A few scattered diverticuli are seen in the left colon.   Vascular/Lymphatic: There is mild atherosclerotic calcification of the abdominal aorta without  aneurysm. There is no gastrohepatic or hepatoduodenal ligament lymphadenopathy. No retroperitoneal or mesenteric lymphadenopathy. 12 mm short axis aortocaval node on 32/2 was present on the 2016 exam and is unchanged suggesting benign/reactive etiology. No pelvic sidewall lymphadenopathy. Patient was noted to have small retroperitoneal and right external iliac nodes with low hypermetabolism on previous PET-CT. No progression of these small lymph nodes that are unenlarged by CT size criteria.   Reproductive: Uterus unremarkable.  There is no adnexal mass.  IMPRESSION: 1. No CT evidence for metastatic disease in the abdomen or pelvis. 2. 9 mm exophytic lesion upper pole right kidney shows increased attenuation compatible with enhancement or proteinaceous/hemorrhagic debris. This lesion was probably present on the 2016 exam measuring about 8 mm at that time. As benign etiology is favored, attention on follow-up restaging exams recommended. 3. Large complex ventral hernia containing long segments of small bowel and colon. No complicating features.   Gynecologic Oncology History:  Seen for PMB in 11/22 by Dr Dalbert Garnet.  Prior endometrial ablation in 2000.   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  Took some Megace, but bleeding got worse, so stopped.  Bad back limits her mobility. Chronic pain.   She has gigantic lower abdominal hernia s/p prior unsuccessful repair with mesh due to incarcerated bowel.     We previously discussed options for management including surgery, radiation, hormonal therapy.  She has a very large recurrent hernia in the lower abdomen that creates a challenge for hysterectomy however, this is her personal preference and expresses desire to have hernia revision as part of joint procedure. Additionally, patient is obese with nulliparous pelvis.   Pelvic MRI 02/16/21 1. Signs of diffuse endometrial cancer within the uterus  up to 90% of the myometrial thickness  is involved. Tumor appears to extend to serosa in the RIGHT parametrium. Potential nodular area extending beyond the uterine fundus as well though on sagittal post-contrast images this cannot be connected to subjacent tumor within the uterus in appears separated from tumor by myometrium. 2. Indeterminate area in the RIGHT pelvis follows broad ligament and may represent an atrophic ovary. Given above findings PET scan may be helpful for complete staging.  3. Large hiatal hernia with 13 cm rectus separation above the symphysis pubis.   PET - 02/23/21 IMPRESSION: 1. Mild limitations secondary to patient body habitus. 2. Hypermetabolic uterine primary. 3. Mildly hypermetabolic abdominal retroperitoneal and pelvic nodes.The inguinal nodes are likely reactive and are typical in the setting of obesity. The right external iliac and abdominal retroperitoneal nodes are equivocal, mildly suspicious for metastatic disease. 4. No supradiaphragmatic metastatic disease identified. 5. Incidental findings, including: Coronary artery atherosclerosis. Aortic Atherosclerosis (ICD10-I70.0).  Prior to completing radiation, 3 treatments remained of about 15, she developed pneumonia and was hospitalized. During hospitalization she suffered delirium and developed infection of skin of pannus. She was discharged to skilled nursing. She transferred to home mid-April and is now back to baseline in regards to physicality. She has lost 17lbs. Now lives alone. Uses a wheelchair for arthritis of lower extremities. Feels well and denies other complaints. She continues megace and denies bleeding since March.   Problem List: Patient Active Problem List   Diagnosis Date Noted   Bacteremia 03/18/2021   Hyponatremia 03/17/2021   Anemia 03/17/2021   Diabetes (HCC) 03/17/2021   GERD (gastroesophageal reflux disease) 03/17/2021   CAP (community acquired pneumonia) 03/17/2021   Cellulitis of  abdominal wall 03/17/2021   Cellulitis 03/17/2021   Endometrial cancer (HCC) 02/09/2021   Post-menopausal bleeding 02/09/2021   Acquired scoliosis 11/11/2020   Arthrodesis status 10/19/2020   History of lumbar fusion 04/13/2020   Other forms of scoliosis, site unspecified 08/29/2019   Body mass index (BMI) 40.0-44.9, adult (HCC) 08/29/2019   Lip swelling 07/28/2019   Spondylolisthesis, lumbar region 06/25/2019   Neck pain 02/25/2016   History of excision of lamina of cervical vertebra for decompression of spinal cord 06/22/2015   Adjustment disorder with mixed anxiety and depressed mood    Slow transit constipation    Myelopathy (HCC) 04/21/2015   Cervical myelopathy (HCC)    Constipation due to pain medication    Depression    Primary osteoarthritis of right shoulder    S/P spinal surgery    Abnormality of gait    Generalized weakness    Other secondary hypertension    Post-operative pain    Arthritis    Cervical spondylosis with myelopathy 04/19/2015   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   Ventral hernia 10/07/2014   Current tobacco use 10/06/2014   Anxiety 09/17/2014   Back pain 09/17/2014   Clinical depression 09/17/2014   BP (high blood pressure) 09/17/2014   Borderline diabetes 09/17/2014   Arthritis, degenerative 09/17/2014   Spinal stenosis 09/17/2014   Compulsive tobacco user syndrome 09/17/2014   Abdominal mass 06/30/2014   Incisional hernia, without obstruction or gangrene 06/30/2014   Morbid obesity (HCC) 06/30/2014   Hypercholesteremia 05/23/2013   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   Hypercholesterolemia 05/23/2013   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) 05/23/2013   Past Medical History: Past Medical History:  Diagnosis Date   Anxiety    Arthritis    Chronic back pain    Diabetes (HCC) 03/17/2021   Endometrial cancer (HCC) 02/09/2021   GERD (gastroesophageal reflux disease)  Hypertension    Post-menopausal bleeding    Spinal stenosis     Umbilical hernia    Uterine cancer (HCC)    Ventral hernia     Past Surgical History: Past Surgical History:  Procedure Laterality Date   ANTERIOR CERVICAL DECOMP/DISCECTOMY FUSION N/A 04/19/2015   Procedure: ANTERIOR CERVICAL DECOMPRESSION/DISCECTOMY FUSION INTERBODY PROTHESIS PLATING BONEGRAFT CERVICAL THREE-FOUR ,CERVICAL FOUR-FIVE;  Surgeon: Tressie Stalker, MD;  Location: [facility];  Service: Neurosurgery;  Laterality: N/A;   BREAST BIOPSY     CARPAL TUNNEL RELEASE     CHOLECYSTECTOMY     FOOT SURGERY Right    IRRIGATION AND DEBRIDEMENT ABDOMEN  01/10/2014   Dr. Egbert Garibaldi   TEE WITHOUT CARDIOVERSION N/A 03/24/2021   Procedure: TRANSESOPHAGEAL ECHOCARDIOGRAM (TEE);  Surgeon: Antonieta Iba, MD;  Location: [facility] OR[facility];  Service: Cardiovascular;  Laterality: N/A;   UMBILICAL HERNIA REPAIR     VENTRAL HERNIA REPAIR  12/26/2013   Dr. Michela Pitcher   WOUND EXPLORATION N/A 07/14/2019   Procedure: LUMBAR EXPLORATION EVACUATION OF HEMATOMA;  Surgeon: Tressie Stalker, MD;  Location: [facility] OR;  Service: Neurosurgery;  Laterality: N/A;     OB History:  OB History  No obstetric history on file.    Family History: Family History  Problem Relation Age of Onset   Diabetes Maternal Grandmother    Diabetes Maternal Grandfather    Diabetes Paternal Grandmother    Diabetes Paternal Grandfather    Hypertension Mother     Social History: Social History   Socioeconomic History   Marital status: Single    Spouse name: Not on file   Number of children: Not on file   Years of education: Not on file   Highest education level: Not on file  Occupational History   Not on file  Tobacco Use   Smoking status: Some Days   Smokeless tobacco: Never   Tobacco comments:    5 cigs daily  Vaping Use   Vaping status: Never Used  Substance and Sexual Activity   Alcohol use: No    Alcohol/week: 0.0 standard drinks of alcohol   Drug use: No   Sexual activity: Not Currently    Birth control/protection: None   Other Topics Concern   Not on file  Social History Narrative   Not on file   Social Drivers of Health   Financial Resource Strain: Not on file  Food Insecurity: Not on file  Transportation Needs: Not on file  Physical Activity: Not on file  Stress: Not on file  Social Connections: Unknown (05/16/2021)   Received from [facility]   Social Network    Social Network: Not on file  Intimate Partner Violence: Unknown (04/07/2021)   Received from [facility], [facility]   HITS    Physically Hurt: Not on file    Insult or Talk Down To: Not on file    Threaten Physical Harm: Not on file    Scream or Curse: Not on file    Allergies: Allergies  Allergen Reactions   Chlorpheniramine Anaphylaxis   Flaxseed (Linseed) Anaphylaxis   Codeine Hives   Flax Seed Oil [Flax Seed Oil] Swelling    UNSPECIFIED    Latex Hives and Other (See Comments)    BLISTERS   Tape Other (See Comments)    BLISTERS   Tapentadol Other (See Comments)    BLISTERS   Rosemary Oil Swelling    Lip swelling   2,4-D Dimethylamine Rash    T-DAP   Aspirin Rash  Current Medications: Current Outpatient Medications  Medication Sig Dispense Refill   acetaminophen (TYLENOL) 500 MG tablet Take 2 tablets (1,000 mg total) by mouth 3 (three) times daily. 30 tablet 0   ALPRAZolam (XANAX) 0.25 MG tablet Take by mouth 2 (two) times daily.     citalopram (CELEXA) 20 MG tablet Take 20 mg by mouth daily.     diclofenac Sodium (VOLTAREN) 1 % GEL Apply 4 g topically 4 (four) times daily.     famotidine (PEPCID) 20 MG tablet Take 1 tablet (20 mg total) by mouth 2 (two) times daily.     FIBER-LAX 625 MG tablet Take by mouth.     fluconazole (DIFLUCAN) 150 MG tablet Take 1 tablet (150 mg total) by mouth daily. Take 1 tablet (150 mg) by mouth. Three days later, take second tablet. 2 tablet 0   megestrol (MEGACE) 40 MG tablet Take 1 tablet (40 mg total) by mouth 2 (two) times daily. 180 tablet 3   meloxicam  (MOBIC) 15 MG tablet Take 15 mg by mouth daily.     metFORMIN (GLUCOPHAGE) 500 MG tablet Take 500 mg by mouth 2 (two) times daily.     methocarbamol (ROBAXIN) 500 MG tablet Take 1 tablet (500 mg total) by mouth every 8 (eight) hours as needed for muscle spasms.     mirtazapine (REMERON) 15 MG tablet Take 1 tablet (15 mg total) by mouth at bedtime.     omeprazole (PRILOSEC) 40 MG capsule Take 40 mg by mouth daily.      pantoprazole (PROTONIX) 40 MG tablet Take 1 tablet (40 mg total) by mouth daily.     senna-docusate (SENOKOT-S) 8.6-50 MG tablet Take 2 tablets by mouth at bedtime.     simethicone (MYLICON) 80 MG chewable tablet Chew 1 tablet (80 mg total) by mouth 4 (four) times daily. 30 tablet 0   sodium chloride 1 g tablet Take 1 tablet (1 g total) by mouth 2 (two) times daily with a meal.     vitamin B-12 (CYANOCOBALAMIN) 1000 MCG tablet Take 1,000 mcg by mouth daily.     No current facility-administered medications for this visit.   Review of Systems General:  no complaints Skin: no complaints Eyes: no complaints HEENT: no complaints Breasts: no complaints Pulmonary: no complaints Cardiac: no complaints Gastrointestinal: no complaints Genitourinary/Sexual: no complaints Ob/Gyn: no complaints Musculoskeletal: back pain chronic Hematology: no complaints Neurologic/Psych: no complaints   Objective:  Physical Examination:  BP 139/64   Pulse 92   Temp 97.6 F (36.4 C)   Resp 19   Wt 264 lb 6.4 oz (119.9 kg)   SpO2 97%   BMI 41.41 kg/m    ECOG Performance Status: 1 - Symptomatic but completely ambulatory  GENERAL: She arrived to clinic in a wheelchair and in no acute distress HEENT: Atraumatic normocephalic NODES:  Negative axillary, supraclavicular, inguinal lymph node survery LUNGS: Normal respiratory effort ABDOMEN:  Soft, nontender.  Large ventral hernia at is protuberant.  No masses or ascites NEURO:  Nonfocal. Well oriented.  Appropriate affect.  Pelvic: Exam per  last visit chaperoned by RN EGBUS within normal limits, normal vagina and vulva Vulva: normal appearing vulva with no masses, tenderness or lesions Vagina: normal vagina; no bleeding or lesions Adnexa: Limited by exam unable to definitively palpate the adnexa. Uterus: Unable to determine uterine size due to the large ventral hernia Cervix: Cervix is normal in appearance. BME; soft parametrial and paravaginal tissue  Lab Results N/a  Radiology:  9/18 CT  A/P results are still pending   Assessment:  Selena Rodgers is a 73 y.o. G0 female diagnosed with grade 1 endometrial adenocarcinoma.  She has a large ventral hernia with prior failed mesh repair and relatively poor PS. In view of this we were considering all possible options for treatment, including non-surgical approaches with radiation and hormones (eg. IUD).  MRI 2/23 showed signs of diffuse endometrial cancer within the uterus up to 90% of the myometrial thickness is involved. Tumor appears to extend to serosa in the RIGHT parametrium. Potential nodular area extending beyond the uterine fundus as well. PET scan showed hypermetabolic uterine primary.  Mildly hypermetabolic abdominal retroperitoneal and pelvic nodes. The right external iliac and abdominal retroperitoneal nodes are equivocal, mildly suspicious for metastatic disease.   Elected to start treatment with radiation 3/23 in view of concern for locally advanced disease.  She completed about 12 of 15 planned treatments and developed pneumonia and was hospitalized. During hospitalization she suffered delirium and developed infection of skin of pannus. She was discharged to skilled nursing. She transferred to home mid-April and is now back to baseline. Uses a wheelchair for arthritis of lower extremities. She continues megace 40 mg qd for hormonal therapy of endometrial cancer and denies bleeding since March. She would like to have surgery to remove the cancer and also repair the large  ventral hernia, but understands that this is problematic.   No evidence of disease on CT scan 9/24.  MMR IHC shows intact expression.  QNS for MSI testing.   Medical co-morbidities complicating care: Massive abdominal hernia, AODM, morbid obesity (BMI 41), chronic back pain.  Plan:   Problem List Items Addressed This Visit       Genitourinary   Endometrial cancer (HCC) - Primary    Continue with hormonal therapy with Megace until there are signs of disease progression.     Dr Diamond Nickel in [facility]astic surgery previously did not think surgical repair of the large abdominal hernia is achievable in view of her less than optimal PS and prior failure of mesh repair with morbid obesity and radiation.    The patient's diagnosis, an outline of the further diagnostic and laboratory studies which will be required, the recommendation, and alternatives were discussed.  All questions were answered to the patient's satisfaction.  She will RTC in 4 months with Dr Rushie Chestnut and in 8 months with [place], or sooner if new concerning symptoms.   Leida Lauth, MD

## 2023-02-13 ENCOUNTER — Other Ambulatory Visit: Payer: Self-pay | Admitting: Nurse Practitioner

## 2023-05-23 ENCOUNTER — Ambulatory Visit: Payer: Medicare HMO | Admitting: Radiation Oncology

## 2023-05-31 ENCOUNTER — Encounter: Payer: Self-pay | Admitting: Radiation Oncology

## 2023-05-31 ENCOUNTER — Ambulatory Visit
Admission: RE | Admit: 2023-05-31 | Discharge: 2023-05-31 | Disposition: A | Discharge status: Home / Self Care | Source: Ambulatory Visit | Attending: Radiation Oncology | Admitting: Radiation Oncology

## 2023-05-31 VITALS — BP 148/70 | HR 86 | Temp 98.0°F

## 2023-05-31 DIAGNOSIS — Z923 Personal history of irradiation: Secondary | ICD-10-CM | POA: Diagnosis not present

## 2023-05-31 DIAGNOSIS — C541 Malignant neoplasm of endometrium: Secondary | ICD-10-CM | POA: Insufficient documentation

## 2023-05-31 NOTE — Progress Notes (Signed)
 Radiation Oncology Follow up Note  Name: Selena Rodgers   Date:   05/31/2023 MRN:  [id number] DOB: 1950-07-17    This 73 y.o. 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  REFERRING PROVIDER: Little Riff, MD  HPI: Patient is a 73 year old female now out 17 months having completed external beam radiation therapy to her pelvis for bleeding from a stage III well-differentiated endometrial adenocarcinoma not an operable candidate.  Seen today in routine follow-up she is fairly asymptomatic specifically denies any bleeding per vagina.  She is having no abdominal complaints specifically denies any increased lower Neri tract symptoms or diarrhea.  She specifically declines any imaging studies as well as pelvic examination..  Her last scan was back in September 2024.  At that time there was no evidence of metastatic disease.  Patient is currently on Megace  and apparently tolerating that well.  COMPLICATIONS OF TREATMENT: none  FOLLOW UP COMPLIANCE: keeps appointments   PHYSICAL EXAM:  BP (!) 148/70   Pulse 86   Temp 98 F (36.7 C) (Tympanic)  Moderately obese female wheelchair-bound with large abdominal hernia well-developed well-nourished patient in NAD. HEENT reveals PERLA, EOMI, discs not visualized.  Oral cavity is clear. No oral mucosal lesions are identified. Neck is clear without evidence of cervical or supraclavicular adenopathy. Lungs are clear to A&P. Cardiac examination is essentially unremarkable with regular rate and rhythm without murmur rub or thrill. Abdomen is benign with no organomegaly or masses noted. Motor sensory and DTR levels are equal and symmetric in the upper and lower extremities. Cranial nerves II through XII are grossly intact.  Proprioception is intact. No peripheral adenopathy or edema is identified. No motor or sensory levels are noted. Crude visual fields are within normal range.  RADIOLOGY RESULTS: No current films for review  PLAN: The present time I will turn follow-up care over to Dr. Marella Shams and GYN oncology.  Patient may be open to a CT scan prior to her next visit with Dr. Marella Shams in October and if she decides that is feasible we will order that prior to her visit.  Otherwise she declines pelvic examination or other any other imaging studies.  She continues on Megace  without side effect.  Will be happy to reevaluate her anytime the future should that be indicated.  I would like to take this opportunity to thank you for allowing me to participate in the care of your patient.Glenis Langdon, MD

## 2023-10-03 ENCOUNTER — Inpatient Hospital Stay: Payer: Medicare HMO | Attending: Obstetrics and Gynecology | Admitting: Obstetrics and Gynecology

## 2023-10-03 ENCOUNTER — Encounter: Payer: Self-pay | Admitting: Obstetrics and Gynecology

## 2023-10-03 VITALS — Temp 98.6°F | Resp 20 | Wt 274.3 lb

## 2023-10-03 DIAGNOSIS — Z923 Personal history of irradiation: Secondary | ICD-10-CM | POA: Diagnosis not present

## 2023-10-03 DIAGNOSIS — K439 Ventral hernia without obstruction or gangrene: Secondary | ICD-10-CM | POA: Diagnosis not present

## 2023-10-03 DIAGNOSIS — Z5181 Encounter for therapeutic drug level monitoring: Secondary | ICD-10-CM

## 2023-10-03 DIAGNOSIS — Z79818 Long term (current) use of other agents affecting estrogen receptors and estrogen levels: Secondary | ICD-10-CM | POA: Insufficient documentation

## 2023-10-03 DIAGNOSIS — C541 Malignant neoplasm of endometrium: Secondary | ICD-10-CM | POA: Diagnosis present

## 2023-10-03 NOTE — Progress Notes (Signed)
 Gynecologic Oncology Interval Visit   Referring Provider: Dr Verdon  Chief Concern: endometrial cancer, grade 1, menorrhagia  Subjective:  Selena Rodgers is a 73 y.o. G0 female initially seen in consultation from Dr. Verdon for endometrial cancer, grade 1, s/p palliative radiation for bleeding, felt to not be a surgical candidate, now s/p EBRT and on megace  40 mg BID. She completed radiation 09/06/21.   Continues megace . 40 mg BID. She returns to clinic for follow up. She has no significant complaints and this specifically no bleeding.  She was last seen by Dr Lenn on 05/31/2023 and released from Radiation Oncology clinic.     Gynecologic Oncology History:  Seen for PMB in 11/22 by Dr Verdon.  Prior endometrial ablation in 2000.   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  She has gigantic lower abdominal hernia s/p prior unsuccessful repair with mesh due to incarcerated bowel.    Pelvic MRI 02/16/21 1. Signs of diffuse endometrial cancer within the uterus up to 90% of the myometrial thickness is involved. Tumor appears to extend to serosa in the RIGHT parametrium. Potential nodular area extending beyond the uterine fundus as well though on sagittal post-contrast images this cannot be connected to subjacent tumor within the uterus in appears separated from tumor by myometrium. 2. Indeterminate area in the RIGHT pelvis follows broad ligament and may represent an atrophic ovary. Given above findings PET scan may be helpful for complete staging.  3. Large hiatal hernia with 13 cm rectus separation above the symphysis pubis.   PET - 02/23/21 IMPRESSION: 1. Mild limitations secondary to patient body habitus. 2. Hypermetabolic uterine primary. 3. Mildly hypermetabolic abdominal retroperitoneal and pelvic nodes.The inguinal nodes are likely reactive and are typical in the setting of obesity. The right external iliac and abdominal retroperitoneal  nodes are equivocal, mildly suspicious for metastatic disease. 4. No supradiaphragmatic metastatic disease identified. 5. Incidental findings, including: Coronary artery atherosclerosis. Aortic Atherosclerosis (ICD10-I70.0).  Due to habitus, concern for locally advanced disease and very large recurrent hernia in the lower abdomen that creates a challenge for hysterectomy radiation therapy and Megace  recommended.   3/23 9/23 Whole pelvic radiation therapy  Prior to completing radiation, 3 treatments remained of about 15, she developed pneumonia and was hospitalized. During hospitalization she suffered delirium and developed infection of skin of pannus. She was discharged to skilled nursing. She transferred to home mid-April and is now back to baseline in regards to physicality.    09/20/22 CT scan  1. No CT evidence for metastatic disease in the abdomen or pelvis. 2. 9 mm exophytic lesion upper pole right kidney shows increased attenuation compatible with enhancement or proteinaceous/hemorrhagic debris. This lesion was probably present on the 2016 exam measuring about 8 mm at that time. As benign etiology is favored, attention on follow-up restaging exams recommended. 3. Large complex ventral hernia containing long segments of small bowel and colon. No complicating features.     Problem List: Patient Active Problem List   Diagnosis Date Noted   Ventral hernia without obstruction or gangrene 07/06/2021   Bacteremia 03/18/2021   Hyponatremia 03/17/2021   Anemia 03/17/2021   Diabetes (HCC) 03/17/2021   GERD (gastroesophageal reflux disease) 03/17/2021   CAP (community acquired pneumonia) 03/17/2021   Cellulitis of abdominal wall 03/17/2021   Cellulitis 03/17/2021   Endometrial cancer (HCC) 02/09/2021   Post-menopausal bleeding 02/09/2021   Acquired scoliosis 11/11/2020   Arthrodesis status 10/19/2020   History of lumbar fusion 04/13/2020   Other  forms of scoliosis, site unspecified  08/29/2019   Body mass index (BMI) 40.0-44.9, adult (HCC) 08/29/2019   Lip swelling 07/28/2019   Spondylolisthesis, lumbar region 06/25/2019   Neck pain 02/25/2016   History of excision of lamina of cervical vertebra for decompression of spinal cord 06/22/2015   Adjustment disorder with mixed anxiety and depressed mood    Slow transit constipation    Myelopathy (HCC) 04/21/2015   Cervical myelopathy (HCC)    Constipation due to pain medication    Depression    Primary osteoarthritis of right shoulder    S/P spinal surgery    Abnormality of gait    Generalized weakness    Other secondary hypertension    Post-operative pain    Arthritis    Cervical spondylosis with myelopathy 04/19/2015   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   Ventral hernia 10/07/2014   Current tobacco use 10/06/2014   Anxiety 09/17/2014   Back pain 09/17/2014   Clinical depression 09/17/2014   BP (high blood pressure) 09/17/2014   Prediabetes 09/17/2014   Arthritis, degenerative 09/17/2014   Spinal stenosis 09/17/2014   Compulsive tobacco user syndrome 09/17/2014   Abdominal mass 06/30/2014   Incisional hernia, without obstruction or gangrene 06/30/2014   Morbid obesity (HCC) 06/30/2014   Hypercholesteremia 05/23/2013   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   Hypercholesterolemia 05/23/2013   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) 05/23/2013   Past Medical History: Past Medical History:  Diagnosis Date   Anxiety    Arthritis    Chronic back pain    Diabetes (HCC) 03/17/2021   Endometrial cancer (HCC) 02/09/2021   GERD (gastroesophageal reflux disease)    Hypertension    Post-menopausal bleeding    Spinal stenosis    Umbilical hernia    Uterine cancer (HCC)    Ventral hernia     Past Surgical History: Past Surgical History:  Procedure Laterality Date   ANTERIOR CERVICAL DECOMP/DISCECTOMY FUSION N/A 04/19/2015   Procedure: ANTERIOR CERVICAL DECOMPRESSION/DISCECTOMY FUSION INTERBODY PROTHESIS  PLATING BONEGRAFT CERVICAL THREE-FOUR ,CERVICAL FOUR-FIVE;  Surgeon: Reyes Budge, MD;  Location: [facility];  Service: Neurosurgery;  Laterality: N/A;   BREAST BIOPSY     CARPAL TUNNEL RELEASE     CHOLECYSTECTOMY     FOOT SURGERY Right    IRRIGATION AND DEBRIDEMENT ABDOMEN  01/10/2014   Dr. Eluterio   TEE WITHOUT CARDIOVERSION N/A 03/24/2021   Procedure: TRANSESOPHAGEAL ECHOCARDIOGRAM (TEE);  Surgeon: Perla Evalene PARAS, MD;  Location: [facility];  Service: Cardiovascular;  Laterality: N/A;   UMBILICAL HERNIA REPAIR     VENTRAL HERNIA REPAIR  12/26/2013   Dr. Lorrene   WOUND EXPLORATION N/A 07/14/2019   Procedure: LUMBAR EXPLORATION EVACUATION OF HEMATOMA;  Surgeon: Budge Reyes, MD;  Location: [facility];  Service: Neurosurgery;  Laterality: N/A;     OB History:  OB History  No obstetric history on file.    Family History: Family History  Problem Relation Age of Onset   Diabetes Maternal Grandmother    Diabetes Maternal Grandfather    Diabetes Paternal Grandmother    Diabetes Paternal Grandfather    Hypertension Mother     Social History: Social History   Socioeconomic History   Marital status: Single    Spouse name: Not on file   Number of children: Not on file   Years of education: Not on file   Highest education level: Not on file  Occupational History   Not on file  Tobacco Use   Smoking status: Some Days   Smokeless  tobacco: Never   Tobacco comments:    5 cigs daily  Vaping Use   Vaping status: Never Used  Substance and Sexual Activity   Alcohol use: No    Alcohol/week: 0.0 standard drinks of alcohol   Drug use: No   Sexual activity: Not Currently    Birth control/protection: None  Other Topics Concern   Not on file  Social History Narrative   Not on file   Social Drivers of Health   Financial Resource Strain: Not on file  Food Insecurity: Not on file  Transportation Needs: Not on file  Physical Activity: Not on file  Stress: Not on file  Social  Connections: Unknown (05/16/2021)   Received [facility]   Social Network    Social Network: Not on file  Intimate Partner Violence: Unknown (04/07/2021)   Received from [facility]   HITS    Physically Hurt: Not on file    Insult or Talk Down To: Not on file    Threaten Physical Harm: Not on file    Scream or Curse: Not on file    Allergies: Allergies  Allergen Reactions   Chlorpheniramine Anaphylaxis   Flaxseed (Linseed) Anaphylaxis   Codeine Hives   Flax Seed Oil [Flax Seed Oil] Swelling    UNSPECIFIED    Latex Hives and Other (See Comments)    BLISTERS   Tape Other (See Comments)    BLISTERS   Tapentadol Other (See Comments)    BLISTERS   Rosemary Oil Swelling    Lip swelling   2,4-D Dimethylamine Rash    T-DAP   Aspirin Rash    Current Medications: Current Outpatient Medications  Medication Sig Dispense Refill   acetaminophen  (TYLENOL ) 500 MG tablet Take 2 tablets (1,000 mg total) by mouth 3 (three) times daily. 30 tablet 0   ALPRAZolam  (XANAX ) 0.25 MG tablet Take by mouth 2 (two) times daily.     citalopram  (CELEXA ) 20 MG tablet Take 20 mg by mouth daily.     diclofenac  Sodium (VOLTAREN ) 1 % GEL Apply 4 g topically 4 (four) times daily.     famotidine  (PEPCID ) 20 MG tablet Take 1 tablet (20 mg total) by mouth 2 (two) times daily.     FIBER-LAX 625 MG tablet Take by mouth.     fluconazole  (DIFLUCAN ) 150 MG tablet Take 1 tablet (150 mg total) by mouth daily. Take 1 tablet (150 mg) by mouth. Three days later, take second tablet. 2 tablet 0   megestrol  (MEGACE ) 40 MG tablet TAKE 1 TABLET BY MOUTH TWICE A DAY 180 tablet 3   meloxicam  (MOBIC ) 15 MG tablet Take 15 mg by mouth daily.     metFORMIN (GLUCOPHAGE) 500 MG tablet Take 500 mg by mouth 2 (two) times daily.     methocarbamol  (ROBAXIN ) 500 MG tablet Take 1 tablet (500 mg total) by mouth every 8 (eight) hours as needed for muscle spasms.     mirtazapine  (REMERON ) 15 MG tablet Take 1 tablet (15 mg total) by  mouth at bedtime.     omeprazole (PRILOSEC) 40 MG capsule Take 40 mg by mouth daily.      pantoprazole  (PROTONIX ) 40 MG tablet Take 1 tablet (40 mg total) by mouth daily.     senna-docusate (SENOKOT-S) 8.6-50 MG tablet Take 2 tablets by mouth at bedtime.     simethicone  (MYLICON) 80 MG chewable tablet Chew 1 tablet (80 mg total) by mouth 4 (four) times daily. 30 tablet 0   sodium chloride   1 g tablet Take 1 tablet (1 g total) by mouth 2 (two) times daily with a meal.     vitamin B-12 (CYANOCOBALAMIN ) 1000 MCG tablet Take 1,000 mcg by mouth daily.     No current facility-administered medications for this visit.   Review of Systems General:  no complaints Skin: no complaints Eyes: no complaints HEENT: no complaints Breasts: no complaints Pulmonary: no complaints Cardiac: no complaints Gastrointestinal: no complaints Genitourinary/Sexual: no complaints Ob/Gyn: no complaints Musculoskeletal: back pain chronic Hematology: no complaints Neurologic/Psych: no complaints   Objective:  Physical Examination:  Temp 98.6 F (37 C)   Resp 20   Wt 274 lb 4.8 oz (124.4 kg)   SpO2 100%   BMI 42.96 kg/m    ECOG Performance Status: 1 - Symptomatic but completely ambulatory  GENERAL: She arrived to clinic in a wheelchair and in no acute distress HEENT:  atraumatic normocephalic NODES:  No cervical, supraclavicular, axillary, lymphadenopathy palpated.  LUNGS:  Normal respiratory effort ABDOMEN:  Soft, nontender.  Large ventral hernia at is protuberant.   EXTREMITIES:  No peripheral edema.   SKIN:  Clear with no obvious rashes or skin changes. No nail dyscrasia. NEURO:  Nonfocal. Well oriented.  Appropriate affect.  Pelvic: she declined pelvic exam   Last Pelvic 01/31/2023: Exam per last visit chaperoned by RN EGBUS within normal limits, normal vagina and vulva Vulva: normal appearing vulva with no masses, tenderness or lesions Vagina: normal vagina; no bleeding or lesions Adnexa:  Limited by exam unable to definitively palpate the adnexa. Uterus: Unable to determine uterine size due to the large ventral hernia Cervix: Cervix is normal in appearance. BME; soft parametrial and paravaginal tissue  Lab Results N/a  Radiology:  N/A   Assessment:  Selena Rodgers is a 73 y.o. G0 female diagnosed with grade 1 endometrial adenocarcinoma.  She has a large ventral hernia with prior failed mesh repair and relatively poor PS. In view of this we were considering all possible options for treatment, including non-surgical approaches with radiation and hormones (eg. IUD).  MRI 2/23 showed signs of diffuse endometrial cancer within the uterus up to 90% of the myometrial thickness is involved. Tumor appears to extend to serosa in the RIGHT parametrium. Potential nodular area extending beyond the uterine fundus as well. PET scan showed hypermetabolic uterine primary.  Mildly hypermetabolic abdominal retroperitoneal and pelvic nodes. The right external iliac and abdominal retroperitoneal nodes are equivocal, mildly suspicious for metastatic disease s/p 3/23 - 9/23 Whole pelvic radiation therapy and currently on Megace  40 mg BID.  No evidence of disease on CT scan 9/24.  MMR IHC shows intact expression.  QNS for MSI testing.   Medical co-morbidities complicating care: Massive abdominal hernia, AODM, morbid obesity (BMI 41), chronic back pain.  Plan:   Problem List Items Addressed This Visit       Genitourinary   Endometrial cancer (HCC) - Primary   Other Visit Diagnoses       Encounter for monitoring adjuvant hormonal therapy           Continue with hormonal therapy with Megace  at this time. Will confirm with Dr. Mancil that he wants her to continue that therapy until there are signs of disease progression versus stopping now as she has been over 2 years since radiation.   I have recommended continued close follow up with exams, including pelvic exams every 6 months for  the next 3 years, then annually thereafter. Since she declines pelvic exam we will not  have her follow up with Dr. Verdon.    Imaging and laboratory assessment is based on clinical indication and we reviewed this with her today, provided precautions, and asked her to call us  with any concerns.   Dr Timm in [facility] surgery previously did not think surgical repair of the large abdominal hernia is achievable in view of her less than optimal PS and prior failure of mesh repair with morbid obesity and radiation.    The patient's diagnosis, an outline of the further diagnostic and laboratory studies which will be required, the recommendation, and alternatives were discussed.  All questions were answered to the patient's satisfaction.    Pennye Beeghly Isidor Constable, MD

## 2023-10-12 ENCOUNTER — Telehealth: Payer: Self-pay

## 2023-10-12 ENCOUNTER — Other Ambulatory Visit: Payer: Self-pay

## 2023-10-12 DIAGNOSIS — C541 Malignant neoplasm of endometrium: Secondary | ICD-10-CM

## 2023-10-12 DIAGNOSIS — Z5181 Encounter for therapeutic drug level monitoring: Secondary | ICD-10-CM

## 2023-10-12 NOTE — Telephone Encounter (Signed)
 Voicemail received from patient today 10/12/23 at 9:52am stating she was returning a missed call from [place]; best contact number is [phone]. Forwarded phone note to Zaria and Kristi for follow up.

## 2023-10-12 NOTE — Telephone Encounter (Signed)
 Selena Rodgers has already followed up with patient. No further follow up needed at this time.

## 2023-10-12 NOTE — Telephone Encounter (Signed)
 Spoke with patient and let her know Dr.berchuck recommendations ( Megace  and see me in a few months with CT scan.) Patient agreed and understood. I also told patient someone would call her soon for an updated appointment.

## 2023-10-16 ENCOUNTER — Encounter: Payer: Self-pay | Admitting: Nurse Practitioner

## 2023-10-24 ENCOUNTER — Encounter: Payer: Self-pay | Admitting: Obstetrics and Gynecology

## 2024-01-22 ENCOUNTER — Encounter: Payer: Self-pay | Admitting: Nurse Practitioner

## 2024-01-25 ENCOUNTER — Telehealth: Payer: Self-pay | Admitting: Obstetrics and Gynecology

## 2024-01-25 NOTE — Telephone Encounter (Signed)
 Called pt to confirm CT for 1/28 - pt notified that if r/s needs to happen due to weather, she can call my number or [facility] to r/s - [facility]

## 2024-01-30 ENCOUNTER — Ambulatory Visit: Admission: RE | Admit: 2024-01-30 | Source: Ambulatory Visit

## 2024-01-31 ENCOUNTER — Ambulatory Visit
Admission: RE | Admit: 2024-01-31 | Discharge: 2024-01-31 | Disposition: A | Discharge status: Home / Self Care | Source: Ambulatory Visit | Attending: Nurse Practitioner | Admitting: Nurse Practitioner

## 2024-01-31 DIAGNOSIS — C541 Malignant neoplasm of endometrium: Secondary | ICD-10-CM | POA: Diagnosis present

## 2024-01-31 DIAGNOSIS — Z5181 Encounter for therapeutic drug level monitoring: Secondary | ICD-10-CM | POA: Diagnosis present

## 2024-01-31 DIAGNOSIS — Z79899 Other long term (current) drug therapy: Secondary | ICD-10-CM | POA: Insufficient documentation

## 2024-01-31 MED ORDER — IOHEXOL 300 MG/ML  SOLN
100.0000 mL | Freq: Once | INTRAMUSCULAR | Status: AC | PRN
  Administered 2024-01-31: 100 mL via INTRAVENOUS

## 2024-02-05 ENCOUNTER — Other Ambulatory Visit: Payer: Self-pay | Admitting: Nurse Practitioner

## 2024-02-06 ENCOUNTER — Inpatient Hospital Stay: Admitting: Obstetrics and Gynecology

## 2024-02-06 VITALS — BP 160/74 | HR 81 | Temp 97.6°F | Resp 20 | Wt 284.5 lb

## 2024-02-06 DIAGNOSIS — C541 Malignant neoplasm of endometrium: Secondary | ICD-10-CM

## 2024-02-06 NOTE — Progress Notes (Signed)
 Gynecologic Oncology Interval Visit   Referring Provider: Dr Verdon  Chief Concern: endometrial cancer, grade 1, menorrhagia  Subjective:  Selena Rodgers is a 74 y.o. G0 female initially seen in consultation from Dr. Verdon for endometrial cancer, grade 1, s/p palliative radiation for bleeding, felt to not be a surgical candidate, now s/p EBRT and on megace  40 mg BID. She completed radiation 09/06/21.   Continues megace . 40 mg BID. She returns to clinic for follow up. She has no significant complaints and specifically no bleeding.  She was last seen by Dr Lenn on 05/31/2023 and released from Radiation Oncology clinic.    CT scan 01/31/24 CT CHEST   Cardiovascular: Normal heart size. No significant pericardial fluid/thickening. Great vessels are normal in course and caliber. No central pulmonary emboli.   Mediastinum/Nodes: Imaged thyroid gland without nodules meeting criteria for imaging follow-up by size. Normal esophagus. Unchanged 10 mm high right paratracheal (2:12).   Lungs/Pleura: The central airways are patent. Mild diffuse bronchial wall thickening. New perifissural right upper lobe nodule measures 8 x 6 mm (3:73). Additional scattered nodules measuring up to 3 mm, for example right lower lobe (3:79), unchanged from 03/17/2021. No pneumothorax. No pleural effusion.   Musculoskeletal: No acute or abnormal lytic or blastic osseous lesions. Flowing anterior osteophytes over at least 4 contiguous thoracic vertebrae which can be seen in the setting of diffuse idiopathic skeletal hyperostosis (DISH).   CT ABDOMEN PELVIS FINDINGS Hepatobiliary: No focal hepatic lesions. No intra or extrahepatic biliary ductal dilation. Cholecystectomy.   Pancreas: No focal lesions or main ductal dilation.   Spleen: Normal in size without focal abnormality.   Adrenals/Urinary Tract: No adrenal nodules. Interval increase in size of hyperattenuating 1.3 cm exophytic right upper pole  lesion, previously 9 mm. Subcentimeter exophytic hyperattenuating focus in the upper pole left kidney (2:66), unchanged from 09/20/2022. Additional minimally complicated anterior lower pole left renal cyst measures 1.6 cm (2:80), unchanged. No hydronephrosis. Punctate nonobstructing right interpolar stone. No focal bladder wall thickening.   Stomach/Bowel: Normal appearance of the stomach. No evidence of bowel wall thickening, distention, or inflammatory changes. Colonic diverticulosis without acute diverticulitis. Normal appendix.   Vascular/Lymphatic: Aortic atherosclerosis. No new enlarged abdominal or pelvic lymph nodes. Unchanged 11 mm aortocaval lymph node (2:72).   Reproductive: No adnexal masses. Similar appearance of uterine contour.   Other: No free fluid, fluid collection, or free air.   Musculoskeletal: No acute or abnormal lytic or blastic osseous findings. Unchanged anterior abdominal hernia containing multiple loops of nonobstructed small and large bowel. Multilevel degenerative changes of the lumbar spine. Postsurgical changes of L2-L5 spinal fusion. Hardware appears intact.   IMPRESSION: 1. New perifissural right upper lobe nodule measures 8 x 6 mm, may represent a focus of subsegmental mucous plugging, infection/inflammation, or a perifissural lymph node. Recommend attention on follow-up. 2. Unchanged 10 mm high right paratracheal lymph node. 3. Interval increase in size of hyperattenuating 1.3 cm exophytic right upper pole renal lesion, previously 9 mm. While this lesion may represent a hemorrhagic/proteinaceous cyst, recommend further evaluation with renal protocol MRI abdomen. 4. Aortic Atherosclerosis (ICD10-I70.0).  Gynecologic Oncology History:  Seen for PMB in 11/22 by Dr Verdon.  Prior endometrial ablation in 2000.  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 She has gigantic lower abdominal hernia s/p prior  unsuccessful repair with mesh due to incarcerated bowel.    Pelvic MRI 02/16/21 1. Signs of diffuse endometrial cancer within the uterus up to 90% of the myometrial  thickness is involved. Tumor appears to extend to serosa in the RIGHT parametrium. Potential nodular area extending beyond the uterine fundus as well though on sagittal post-contrast images this cannot be connected to subjacent tumor within the uterus in appears separated from tumor by myometrium. 2. Indeterminate area in the RIGHT pelvis follows broad ligament and may represent an atrophic ovary. Given above findings PET scan may be helpful for complete staging.  3. Large hiatal hernia with 13 cm rectus separation above the symphysis pubis.   PET - 02/23/21 IMPRESSION: 1. Mild limitations secondary to patient body habitus. 2. Hypermetabolic uterine primary. 3. Mildly hypermetabolic abdominal retroperitoneal and pelvic nodes.The inguinal nodes are likely reactive and are typical in the setting of obesity. The right external iliac and abdominal retroperitoneal nodes are equivocal, mildly suspicious for metastatic disease. 4. No supradiaphragmatic metastatic disease identified. 5. Incidental findings, including: Coronary artery atherosclerosis. Aortic Atherosclerosis (ICD10-I70.0).  Due to habitus, concern for locally advanced disease and very large recurrent hernia in the lower abdomen that creates a challenge for hysterectomy radiation therapy and Megace  recommended.   3/23 9/23 Whole pelvic radiation therapy  Prior to completing radiation, 3 treatments remained of about 15, she developed pneumonia and was hospitalized. During hospitalization she suffered delirium and developed infection of skin of pannus. She was discharged to skilled nursing. She transferred to home mid-April and is now back to baseline in regards to physicality.    09/20/22 CT scan  1. No CT evidence for metastatic disease in the abdomen or pelvis. 2. 9 mm  exophytic lesion upper pole right kidney shows increased attenuation compatible with enhancement or proteinaceous/hemorrhagic debris. This lesion was probably present on the 2016 exam measuring about 8 mm at that time. As benign etiology is favored, attention on follow-up restaging exams recommended. 3. Large complex ventral hernia containing long segments of small bowel and colon. No complicating features.  Problem List: Patient Active Problem List   Diagnosis Date Noted   Ventral hernia without obstruction or gangrene 07/06/2021   Bacteremia 03/18/2021   Hyponatremia 03/17/2021   Anemia 03/17/2021   Diabetes (HCC) 03/17/2021   GERD (gastroesophageal reflux disease) 03/17/2021   CAP (community acquired pneumonia) 03/17/2021   Cellulitis of abdominal wall 03/17/2021   Cellulitis 03/17/2021   Endometrial cancer (HCC) 02/09/2021   Post-menopausal bleeding 02/09/2021   Acquired scoliosis 11/11/2020   Arthrodesis status 10/19/2020   History of lumbar fusion 04/13/2020   Other forms of scoliosis, site unspecified 08/29/2019   Body mass index (BMI) 40.0-44.9, adult (HCC) 08/29/2019   Lip swelling 07/28/2019   Spondylolisthesis, lumbar region 06/25/2019   Neck pain 02/25/2016   History of excision of lamina of cervical vertebra for decompression of spinal cord 06/22/2015   Adjustment disorder with mixed anxiety and depressed mood    Slow transit constipation    Myelopathy (HCC) 04/21/2015   Cervical myelopathy (HCC)    Constipation due to pain medication    Depression    Primary osteoarthritis of right shoulder    S/P spinal surgery    Abnormality of gait    Generalized weakness    Other secondary hypertension    Post-operative pain    Arthritis    Cervical spondylosis with myelopathy 04/19/2015   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   Ventral hernia 10/07/2014   Current tobacco use 10/06/2014   Anxiety 09/17/2014   Back pain 09/17/2014   Clinical depression 09/17/2014   BP (high  blood pressure) 09/17/2014   Prediabetes 09/17/2014   Arthritis, degenerative 09/17/2014  Spinal stenosis 09/17/2014   Compulsive tobacco user syndrome 09/17/2014   Abdominal mass 06/30/2014   Incisional hernia, without obstruction or gangrene 06/30/2014   Morbid obesity (HCC) 06/30/2014   Hypercholesteremia 05/23/2013   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   Hypercholesterolemia 05/23/2013   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) 05/23/2013   Past Medical History: Past Medical History:  Diagnosis Date   Anxiety    Arthritis    Chronic back pain    Diabetes (HCC) 03/17/2021   Endometrial cancer (HCC) 02/09/2021   GERD (gastroesophageal reflux disease)    Hypertension    Post-menopausal bleeding    Spinal stenosis    Umbilical hernia    Uterine cancer (HCC)    Ventral hernia     Past Surgical History: Past Surgical History:  Procedure Laterality Date   ANTERIOR CERVICAL DECOMP/DISCECTOMY FUSION N/A 04/19/2015   Procedure: ANTERIOR CERVICAL DECOMPRESSION/DISCECTOMY FUSION INTERBODY PROTHESIS PLATING BONEGRAFT CERVICAL THREE-FOUR ,CERVICAL FOUR-FIVE;  Surgeon: Reyes Budge, MD;  Location: [facility];  Service: Neurosurgery;  Laterality: N/A;   BREAST BIOPSY     CARPAL TUNNEL RELEASE     CHOLECYSTECTOMY     FOOT SURGERY Right    IRRIGATION AND DEBRIDEMENT ABDOMEN  01/10/2014   Dr. Eluterio   TEE WITHOUT CARDIOVERSION N/A 03/24/2021   Procedure: TRANSESOPHAGEAL ECHOCARDIOGRAM (TEE);  Surgeon: Perla Evalene PARAS, MD;  Location: [facility];  Service: Cardiovascular;  Laterality: N/A;   UMBILICAL HERNIA REPAIR     VENTRAL HERNIA REPAIR  12/26/2013   Dr. Lorrene   WOUND EXPLORATION N/A 07/14/2019   Procedure: LUMBAR EXPLORATION EVACUATION OF HEMATOMA;  Surgeon: Budge Reyes, MD;  Location: [facility] OR;  Service: Neurosurgery;  Laterality: N/A;     OB History:  OB History  No obstetric history on file.    Family History: Family History  Problem Relation Age of Onset    Diabetes Maternal Grandmother    Diabetes Maternal Grandfather    Diabetes Paternal Grandmother    Diabetes Paternal Grandfather    Hypertension Mother     Social History: Social History   Socioeconomic History   Marital status: Single    Spouse name: Not on file   Number of children: Not on file   Years of education: Not on file   Highest education level: Not on file  Occupational History   Not on file  Tobacco Use   Smoking status: Some Days   Smokeless tobacco: Never   Tobacco comments:    5 cigs daily  Vaping Use   Vaping status: Never Used  Substance and Sexual Activity   Alcohol use: No    Alcohol/week: 0.0 standard drinks of alcohol   Drug use: No   Sexual activity: Not Currently    Birth control/protection: None  Other Topics Concern   Not on file  Social History Narrative   Not on file   Social Drivers of Health   Tobacco Use: High Risk (10/03/2023)   Patient History    Smoking Tobacco Use: Some Days    Smokeless Tobacco Use: Never    Passive Exposure: Not on file  Financial Resource Strain: Not on file  Food Insecurity: Not on file  Transportation Needs: Not on file  Physical Activity: Not on file  Stress: Not on file  Social Connections: Unknown (05/16/2021)   [facility]   Social Network    Social Network: Not on file  Intimate Partner Violence: Unknown (04/07/2021)   Received from [facility]   HITS  Physically Hurt: Not on file    Insult or Talk Down To: Not on file    Threaten Physical Harm: Not on file    Scream or Curse: Not on file  Depression (PHQ2-9): Low Risk (04/12/2021)   Depression (PHQ2-9)    PHQ-2 Score: 0  Alcohol Screen: Not on file  Housing: Unknown (01/22/2024)   Received from [facility]   Epic    Unable to Pay for Housing in the Last Year: Not on file    Number of Times Moved in the Last Year: Not on file    At any time in the past 12 months, were you homeless or living in a shelter  (including now)?: No  Utilities: Not on file  Health Literacy: Not on file    Allergies: Allergies  Allergen Reactions   Chlorpheniramine Anaphylaxis   Flaxseed (Linseed) Anaphylaxis   Codeine Hives   Flax Seed Oil [Flax Seed Oil] Swelling    UNSPECIFIED    Latex Hives and Other (See Comments)    BLISTERS   Tape Other (See Comments)    BLISTERS   Tapentadol Other (See Comments)    BLISTERS   Rosemary Oil Swelling    Lip swelling   2,4-D Dimethylamine Rash    T-DAP   Aspirin Rash    Current Medications: Current Outpatient Medications  Medication Sig Dispense Refill   acetaminophen  (TYLENOL ) 500 MG tablet Take 2 tablets (1,000 mg total) by mouth 3 (three) times daily. 30 tablet 0   ALPRAZolam  (XANAX ) 0.25 MG tablet Take by mouth 2 (two) times daily.     citalopram  (CELEXA ) 20 MG tablet Take 20 mg by mouth daily.     diclofenac  Sodium (VOLTAREN ) 1 % GEL Apply 4 g topically 4 (four) times daily.     famotidine  (PEPCID ) 20 MG tablet Take 1 tablet (20 mg total) by mouth 2 (two) times daily.     FIBER-LAX 625 MG tablet Take by mouth.     fluconazole  (DIFLUCAN ) 150 MG tablet Take 1 tablet (150 mg total) by mouth daily. Take 1 tablet (150 mg) by mouth. Three days later, take second tablet. 2 tablet 0   megestrol  (MEGACE ) 40 MG tablet TAKE 1 TABLET BY MOUTH TWICE A DAY 180 tablet 3   meloxicam  (MOBIC ) 15 MG tablet Take 15 mg by mouth daily.     metFORMIN (GLUCOPHAGE) 500 MG tablet Take 500 mg by mouth 2 (two) times daily.     methocarbamol  (ROBAXIN ) 500 MG tablet Take 1 tablet (500 mg total) by mouth every 8 (eight) hours as needed for muscle spasms.     mirtazapine  (REMERON ) 15 MG tablet Take 1 tablet (15 mg total) by mouth at bedtime.     omeprazole (PRILOSEC) 40 MG capsule Take 40 mg by mouth daily.      pantoprazole  (PROTONIX ) 40 MG tablet Take 1 tablet (40 mg total) by mouth daily.     senna-docusate (SENOKOT-S) 8.6-50 MG tablet Take 2 tablets by mouth at bedtime.      simethicone  (MYLICON) 80 MG chewable tablet Chew 1 tablet (80 mg total) by mouth 4 (four) times daily. 30 tablet 0   sodium chloride  1 g tablet Take 1 tablet (1 g total) by mouth 2 (two) times daily with a meal.     vitamin B-12 (CYANOCOBALAMIN ) 1000 MCG tablet Take 1,000 mcg by mouth daily.     No current facility-administered medications for this visit.   Review of Systems General:  no complaints  Skin: no complaints Eyes: no complaints HEENT: no complaints Breasts: no complaints Pulmonary: no complaints Cardiac: no complaints Gastrointestinal: no complaints Genitourinary/Sexual: no complaints Ob/Gyn: no complaints Musculoskeletal: back pain chronic Hematology: no complaints Neurologic/Psych: no complaints   Objective:  Physical Examination:   Vitals:   02/06/24 1352  BP: (!) 160/74  Pulse: 81  Resp: 20  Temp: 97.6 F (36.4 C)  SpO2: 100%     ECOG Performance Status: 2 - Symptomatic, <50% confined to bed  GENERAL: She arrived to clinic in a wheelchair and in no acute distress HEENT:  atraumatic normocephalic NODES:  No cervical, supraclavicular, axillary, lymphadenopathy palpated.  LUNGS:  Normal respiratory effort ABDOMEN:  Soft, nontender.  Large ventral hernia at is protuberant.   EXTREMITIES:  No peripheral edema.   SKIN:  Clear with no obvious rashes or skin changes. No nail dyscrasia. NEURO:  Nonfocal. Well oriented.  Appropriate affect.  Pelvic: she declined pelvic exam   Last Pelvic 01/31/2023: Exam per last visit chaperoned by RN EGBUS within normal limits, normal vagina and vulva Vulva: normal appearing vulva with no masses, tenderness or lesions Vagina: normal vagina; no bleeding or lesions Adnexa: Limited by exam unable to definitively palpate the adnexa. Uterus: Unable to determine uterine size due to the large ventral hernia Cervix: Cervix is normal in appearance. BME; soft parametrial and paravaginal tissue  Lab Results N/a  Radiology:   N/A   Assessment:  Selena Rodgers is a 74 y.o. G0 female diagnosed with grade 1 endometrial adenocarcinoma 1/23.  She has a large ventral hernia with prior failed mesh repair and relatively poor PS. In view of this we were considering all possible options for treatment, including non-surgical approaches with radiation and hormones (eg. IUD).  MRI 2/23 showed signs of diffuse endometrial cancer within the uterus up to 90% of the myometrial thickness is involved. Tumor appears to extend to serosa in the RIGHT parametrium. Potential nodular area extending beyond the uterine fundus as well. PET scan showed hypermetabolic uterine primary.  Mildly hypermetabolic abdominal retroperitoneal and pelvic nodes. The right external iliac and abdominal retroperitoneal nodes are equivocal, mildly suspicious for metastatic disease s/p 3/23 - 9/23 Whole pelvic radiation therapy and currently on Megace  40 mg BID.  No evidence of disease on CT scan 9/24 and more recently 1/26.  MMR IHC shows intact expression.  QNS for MSI testing.   Medical co-morbidities complicating care: Massive abdominal hernia, AODM, morbid obesity (BMI 41), chronic back pain.  Plan:   Problem List Items Addressed This Visit       Genitourinary   Endometrial cancer (HCC) - Primary    Continue with hormonal therapy with Megace  at this time. She will continue that therapy until there are signs of disease progression.   I have recommended continued close follow up with exams, including pelvic exams every 6 months for the next 3 years, then annually thereafter. Since she declines pelvic exam we will not have her follow up with Dr. Verdon.    Imaging and laboratory assessment is based on clinical indication and we reviewed this with her today, provided precautions, and asked her to call us  with any concerns.   Dr Timm in [facility] previously did not think surgical repair of the large abdominal hernia is achievable in view  of her less than optimal PS and prior failure of mesh repair with morbid obesity and radiation.    The patient's diagnosis, an outline of the further diagnostic and laboratory studies which  will be required, the recommendation, and alternatives were discussed.  All questions were answered to the patient's satisfaction.   Prentice Agent, MD

## 2024-02-07 ENCOUNTER — Encounter: Payer: Self-pay | Admitting: Obstetrics and Gynecology

## 2024-02-07 ENCOUNTER — Telehealth: Payer: Self-pay | Admitting: Obstetrics and Gynecology

## 2024-02-07 NOTE — Telephone Encounter (Signed)
 Called pt to sched CT - pt confirmed date/time/location - pt requested appt reminder via mail - LH

## 2024-04-02 ENCOUNTER — Ambulatory Visit

## 2024-08-05 ENCOUNTER — Ambulatory Visit

## 2024-08-13 ENCOUNTER — Inpatient Hospital Stay

## 2024-08-20 ENCOUNTER — Inpatient Hospital Stay
